# Patient Record
Sex: Female | Born: 1950
Health system: Southern US, Community
[De-identification: ages and names within clinical notes are randomized; demographics above are authoritative.]

## PROBLEM LIST (undated history)

## (undated) DIAGNOSIS — R233 Spontaneous ecchymoses: Secondary | ICD-10-CM

## (undated) DIAGNOSIS — E119 Type 2 diabetes mellitus without complications: Secondary | ICD-10-CM

## (undated) DIAGNOSIS — Z8709 Personal history of other diseases of the respiratory system: Secondary | ICD-10-CM

## (undated) DIAGNOSIS — I2510 Atherosclerotic heart disease of native coronary artery without angina pectoris: Secondary | ICD-10-CM

## (undated) DIAGNOSIS — Z87891 Personal history of nicotine dependence: Secondary | ICD-10-CM

## (undated) DIAGNOSIS — T4145XA Adverse effect of unspecified anesthetic, initial encounter: Secondary | ICD-10-CM

## (undated) DIAGNOSIS — Z9861 Coronary angioplasty status: Secondary | ICD-10-CM

## (undated) DIAGNOSIS — M1990 Unspecified osteoarthritis, unspecified site: Secondary | ICD-10-CM

## (undated) DIAGNOSIS — I2119 ST elevation (STEMI) myocardial infarction involving other coronary artery of inferior wall: Secondary | ICD-10-CM

## (undated) DIAGNOSIS — J449 Chronic obstructive pulmonary disease, unspecified: Secondary | ICD-10-CM

## (undated) DIAGNOSIS — I10 Essential (primary) hypertension: Secondary | ICD-10-CM

## (undated) DIAGNOSIS — R112 Nausea with vomiting, unspecified: Secondary | ICD-10-CM

## (undated) DIAGNOSIS — T8859XA Other complications of anesthesia, initial encounter: Secondary | ICD-10-CM

## (undated) DIAGNOSIS — M2550 Pain in unspecified joint: Secondary | ICD-10-CM

## (undated) DIAGNOSIS — G4733 Obstructive sleep apnea (adult) (pediatric): Secondary | ICD-10-CM

## (undated) DIAGNOSIS — E669 Obesity, unspecified: Secondary | ICD-10-CM

## (undated) DIAGNOSIS — Z9889 Other specified postprocedural states: Secondary | ICD-10-CM

## (undated) DIAGNOSIS — H269 Unspecified cataract: Secondary | ICD-10-CM

## (undated) DIAGNOSIS — E785 Hyperlipidemia, unspecified: Secondary | ICD-10-CM

## (undated) DIAGNOSIS — R0600 Dyspnea, unspecified: Secondary | ICD-10-CM

## (undated) DIAGNOSIS — C50411 Malignant neoplasm of upper-outer quadrant of right female breast: Secondary | ICD-10-CM

## (undated) DIAGNOSIS — J45909 Unspecified asthma, uncomplicated: Secondary | ICD-10-CM

## (undated) DIAGNOSIS — R238 Other skin changes: Secondary | ICD-10-CM

## (undated) HISTORY — PX: TUBAL LIGATION: SHX77

## (undated) HISTORY — PX: ABDOMINAL HYSTERECTOMY: SHX81

## (undated) HISTORY — PX: COLONOSCOPY: SHX174

## (undated) HISTORY — PX: OTHER SURGICAL HISTORY: SHX169

## (undated) HISTORY — DX: Obstructive sleep apnea (adult) (pediatric): G47.33

## (undated) HISTORY — PX: APPENDECTOMY: SHX54

## (undated) HISTORY — PX: EYE SURGERY: SHX253

## (undated) HISTORY — DX: Unspecified osteoarthritis, unspecified site: M19.90

## (undated) HISTORY — PX: SPINAL FUSION: SHX223

## (undated) HISTORY — PX: CHOLECYSTECTOMY: SHX55

## (undated) HISTORY — DX: Malignant neoplasm of upper-outer quadrant of right female breast: C50.411

## (undated) HISTORY — PX: PERCUTANEOUS CORONARY STENT INTERVENTION (PCI-S): SHX6016

---
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
## 1999-12-26 ENCOUNTER — Encounter: Admission: RE | Admit: 1999-12-26 | Discharge: 1999-12-26 | Payer: Self-pay | Admitting: *Deleted

## 2002-08-27 ENCOUNTER — Encounter: Admission: RE | Admit: 2002-08-27 | Discharge: 2002-08-27 | Payer: Self-pay | Admitting: Internal Medicine

## 2002-08-27 ENCOUNTER — Encounter: Payer: Self-pay | Admitting: Internal Medicine

## 2003-05-31 ENCOUNTER — Emergency Department (HOSPITAL_COMMUNITY): Admission: EM | Admit: 2003-05-31 | Discharge: 2003-06-01 | Payer: Self-pay | Admitting: *Deleted

## 2003-06-01 ENCOUNTER — Encounter: Payer: Self-pay | Admitting: Emergency Medicine

## 2005-12-19 ENCOUNTER — Encounter: Admission: RE | Admit: 2005-12-19 | Discharge: 2005-12-19 | Payer: Self-pay | Admitting: Internal Medicine

## 2006-07-02 ENCOUNTER — Ambulatory Visit (HOSPITAL_COMMUNITY): Admission: RE | Admit: 2006-07-02 | Discharge: 2006-07-02 | Payer: Self-pay | Admitting: Gastroenterology

## 2006-07-04 ENCOUNTER — Ambulatory Visit (HOSPITAL_COMMUNITY): Admission: RE | Admit: 2006-07-04 | Discharge: 2006-07-04 | Payer: Self-pay | Admitting: Gastroenterology

## 2009-02-10 ENCOUNTER — Other Ambulatory Visit: Admission: RE | Admit: 2009-02-10 | Discharge: 2009-02-10 | Payer: Self-pay | Admitting: Family Medicine

## 2009-07-09 ENCOUNTER — Encounter: Admission: RE | Admit: 2009-07-09 | Discharge: 2009-07-09 | Payer: Self-pay | Admitting: Family Medicine

## 2009-08-03 ENCOUNTER — Inpatient Hospital Stay (HOSPITAL_COMMUNITY): Admission: RE | Admit: 2009-08-03 | Discharge: 2009-08-09 | Payer: Self-pay | Admitting: Neurosurgery

## 2009-11-05 ENCOUNTER — Emergency Department (HOSPITAL_COMMUNITY): Admission: EM | Admit: 2009-11-05 | Discharge: 2009-11-05 | Payer: Self-pay | Admitting: Emergency Medicine

## 2010-05-26 ENCOUNTER — Encounter: Admission: RE | Admit: 2010-05-26 | Discharge: 2010-05-26 | Payer: Self-pay | Admitting: Orthopedic Surgery

## 2010-11-27 ENCOUNTER — Encounter: Payer: Self-pay | Admitting: Orthopedic Surgery

## 2011-02-10 LAB — CBC
HCT: 42.8 % (ref 36.0–46.0)
Hemoglobin: 14.4 g/dL (ref 12.0–15.0)
MCHC: 33.7 g/dL (ref 30.0–36.0)
RDW: 12.7 % (ref 11.5–15.5)

## 2011-02-10 LAB — BASIC METABOLIC PANEL
CO2: 31 mEq/L (ref 19–32)
Calcium: 9.7 mg/dL (ref 8.4–10.5)
Glucose, Bld: 113 mg/dL — ABNORMAL HIGH (ref 70–99)
Sodium: 139 mEq/L (ref 135–145)

## 2011-02-10 LAB — TYPE AND SCREEN: Antibody Screen: NEGATIVE

## 2011-03-24 NOTE — Op Note (Signed)
NAMEAMANDAMARIE, Mariah Kelley         ACCOUNT NO.:  [id number]   MEDICAL RECORD NO.:  [id number]          PATIENT TYPE:  OUT   LOCATION:  XRAY                         FACILITY:  [facility]   PHYSICIAN:  Anselmo Rod, M.D.  DATE OF BIRTH:  11/29/1950   DATE OF PROCEDURE:  07/02/2006  DATE OF DISCHARGE:                                 OPERATIVE REPORT   PROCEDURE PERFORMED:  Esophagogastroduodenoscopy.   ENDOSCOPIST:  Anselmo Rod, M.D.   INSTRUMENT USED:  Olympus videoscopic pan endoscope.   INDICATIONS FOR PROCEDURE:  A 60 year old white female with a history of  abnormal abdominal CT with question of pylorospasm with an abnormality in  the gastric pylorus, undergoing an EGD for abdominal pain and recurrent  bouts of nausea and vomiting, rule out gastric outlet obstruction, ulcer  disease, etc.   PRE-PROCEDURE PREPARATION:  Informed consent was procured from the patient.  The patient was fasted for four hours prior to the procedure.  Risks and  benefits of the procedure were discussed with the patient in great detail.   PRE-PROCEDURE PHYSICAL EXAMINATION:  VITAL SIGNS:  Stable vital signs.  NECK:  Supple.  CHEST:  Clear to auscultation.  HEART:  S1 and S2.  Regular.  ABDOMEN:  Soft with epigastric tenderness on palpation with guarding.  No  rebound or rigidity.  No hepatosplenomegaly.   DESCRIPTION OF PROCEDURE:  The patient was placed in a left lateral  decubitus position and sedated with 75 mcg of fentanyl and 5 mg of Versed in  slow, incremental doses.  Once the patient was adequately sedated and  maintained on low flow oxygen with continuous cardiac monitoring, the  Olympus videoscopic pan endoscope was advanced with the mouthpiece over the  tongue into the esophagus.  Under direct vision, the entire esophagus  appeared normal with no evidence of ring, suture, mass, esophagitis, or  Barrett's mucosa.  The scope was then advanced to the stomach.  The entire  gastric mucosa  appeared normal with no evidence of ulcers, erosions, masses,  or polyps.  There were no masses noted on retroflexion into the high cardia.  The proximal small bowel appeared normal.  No abnormalities were noted in  the pylorus that corresponded with abnormalities on the recent CT done at  [facility] that showed luminal narrowing of the pylorus with decreased  distensibility.  The patient tolerated the procedure well without  complications.   IMPRESSION:  Normal esophagogastroduodenoscopy.   RECOMMENDATIONS:  A small bowel follow-through versus an enteroclysis will  be considered and further recommendation made thereafter.      Anselmo Rod, M.D.  Electronically Signed     JNM/MEDQ  D:  07/04/2006  T:  07/04/2006  Job:  [id number]   cc:   Merlene Laughter. Renae Gloss, M.D.

## 2014-01-17 ENCOUNTER — Encounter (HOSPITAL_COMMUNITY)
Admission: EM | Disposition: A | Discharge status: Home / Self Care | Payer: BC Managed Care – PPO | Source: Home / Self Care | Attending: Cardiovascular Disease

## 2014-01-17 ENCOUNTER — Encounter (HOSPITAL_COMMUNITY): Payer: Self-pay | Admitting: Emergency Medicine

## 2014-01-17 ENCOUNTER — Inpatient Hospital Stay (HOSPITAL_COMMUNITY)
Admission: EM | Admit: 2014-01-17 | Discharge: 2014-01-20 | DRG: 247 | Disposition: A | Discharge status: Home / Self Care | Payer: BC Managed Care – PPO | Attending: Cardiovascular Disease | Admitting: Cardiovascular Disease

## 2014-01-17 DIAGNOSIS — E7800 Pure hypercholesterolemia, unspecified: Secondary | ICD-10-CM | POA: Diagnosis present

## 2014-01-17 DIAGNOSIS — Z87891 Personal history of nicotine dependence: Secondary | ICD-10-CM

## 2014-01-17 DIAGNOSIS — E119 Type 2 diabetes mellitus without complications: Secondary | ICD-10-CM

## 2014-01-17 DIAGNOSIS — I2510 Atherosclerotic heart disease of native coronary artery without angina pectoris: Secondary | ICD-10-CM

## 2014-01-17 DIAGNOSIS — I252 Old myocardial infarction: Secondary | ICD-10-CM | POA: Diagnosis present

## 2014-01-17 DIAGNOSIS — F411 Generalized anxiety disorder: Secondary | ICD-10-CM | POA: Diagnosis present

## 2014-01-17 DIAGNOSIS — I10 Essential (primary) hypertension: Secondary | ICD-10-CM

## 2014-01-17 DIAGNOSIS — Z888 Allergy status to other drugs, medicaments and biological substances status: Secondary | ICD-10-CM

## 2014-01-17 DIAGNOSIS — Z8249 Family history of ischemic heart disease and other diseases of the circulatory system: Secondary | ICD-10-CM

## 2014-01-17 DIAGNOSIS — E8881 Metabolic syndrome: Secondary | ICD-10-CM | POA: Diagnosis present

## 2014-01-17 DIAGNOSIS — I213 ST elevation (STEMI) myocardial infarction of unspecified site: Secondary | ICD-10-CM

## 2014-01-17 DIAGNOSIS — Z7982 Long term (current) use of aspirin: Secondary | ICD-10-CM

## 2014-01-17 DIAGNOSIS — Z6841 Body Mass Index (BMI) 40.0 and over, adult: Secondary | ICD-10-CM

## 2014-01-17 DIAGNOSIS — Z9861 Coronary angioplasty status: Secondary | ICD-10-CM

## 2014-01-17 DIAGNOSIS — E785 Hyperlipidemia, unspecified: Secondary | ICD-10-CM

## 2014-01-17 DIAGNOSIS — Z955 Presence of coronary angioplasty implant and graft: Secondary | ICD-10-CM

## 2014-01-17 DIAGNOSIS — I219 Acute myocardial infarction, unspecified: Secondary | ICD-10-CM

## 2014-01-17 DIAGNOSIS — I2119 ST elevation (STEMI) myocardial infarction involving other coronary artery of inferior wall: Principal | ICD-10-CM

## 2014-01-17 DIAGNOSIS — E669 Obesity, unspecified: Secondary | ICD-10-CM | POA: Diagnosis present

## 2014-01-17 HISTORY — DX: Essential (primary) hypertension: I10

## 2014-01-17 HISTORY — DX: Atherosclerotic heart disease of native coronary artery without angina pectoris: I25.10

## 2014-01-17 HISTORY — DX: Coronary angioplasty status: Z98.61

## 2014-01-17 HISTORY — PX: LEFT HEART CATH: SHX5478

## 2014-01-17 HISTORY — DX: ST elevation (STEMI) myocardial infarction involving other coronary artery of inferior wall: I21.19

## 2014-01-17 HISTORY — DX: Personal history of nicotine dependence: Z87.891

## 2014-01-17 HISTORY — DX: Obesity, unspecified: E66.9

## 2014-01-17 HISTORY — DX: Hyperlipidemia, unspecified: E78.5

## 2014-01-17 LAB — HEMOGLOBIN A1C
Hgb A1c MFr Bld: 6.4 % — ABNORMAL HIGH (ref <5.7)
MEAN PLASMA GLUCOSE: 137 mg/dL — AB (ref <117)

## 2014-01-17 LAB — COMPREHENSIVE METABOLIC PANEL
ALT: 22 U/L (ref 0–35)
AST: 21 U/L (ref 0–37)
Albumin: 4 g/dL (ref 3.5–5.2)
Alkaline Phosphatase: 80 U/L (ref 39–117)
BILIRUBIN TOTAL: 0.3 mg/dL (ref 0.3–1.2)
BUN: 18 mg/dL (ref 6–23)
CALCIUM: 9.6 mg/dL (ref 8.4–10.5)
CHLORIDE: 99 meq/L (ref 96–112)
CO2: 26 meq/L (ref 19–32)
CREATININE: 0.75 mg/dL (ref 0.50–1.10)
GFR, EST NON AFRICAN AMERICAN: 88 mL/min — AB (ref >90)
GLUCOSE: 204 mg/dL — AB (ref 70–99)
Potassium: 3.9 mEq/L (ref 3.7–5.3)
Sodium: 141 mEq/L (ref 137–147)
Total Protein: 7.2 g/dL (ref 6.0–8.3)

## 2014-01-17 LAB — CBC WITH DIFFERENTIAL/PLATELET
BASOS ABS: 0 10*3/uL (ref 0.0–0.1)
Basophils Relative: 0 % (ref 0–1)
EOS PCT: 1 % (ref 0–5)
Eosinophils Absolute: 0.1 10*3/uL (ref 0.0–0.7)
HEMATOCRIT: 41.7 % (ref 36.0–46.0)
HEMOGLOBIN: 13.9 g/dL (ref 12.0–15.0)
LYMPHS ABS: 1.7 10*3/uL (ref 0.7–4.0)
LYMPHS PCT: 18 % (ref 12–46)
MCH: 29.2 pg (ref 26.0–34.0)
MCHC: 33.3 g/dL (ref 30.0–36.0)
MCV: 87.6 fL (ref 78.0–100.0)
MONO ABS: 0.4 10*3/uL (ref 0.1–1.0)
MONOS PCT: 4 % (ref 3–12)
NEUTROS ABS: 7.4 10*3/uL (ref 1.7–7.7)
Neutrophils Relative %: 77 % (ref 43–77)
Platelets: 297 10*3/uL (ref 150–400)
RBC: 4.76 MIL/uL (ref 3.87–5.11)
RDW: 12.8 % (ref 11.5–15.5)
WBC: 9.7 10*3/uL (ref 4.0–10.5)

## 2014-01-17 LAB — TROPONIN I
TROPONIN I: 0.9 ng/mL — AB (ref <0.30)
Troponin I: 0.56 ng/mL (ref <0.30)
Troponin I: 9.37 ng/mL (ref <0.30)

## 2014-01-17 LAB — POCT ACTIVATED CLOTTING TIME
Activated Clotting Time: 188 seconds
Activated Clotting Time: 437 seconds

## 2014-01-17 LAB — MAGNESIUM: Magnesium: 1.7 mg/dL (ref 1.5–2.5)

## 2014-01-17 LAB — MRSA PCR SCREENING: MRSA BY PCR: NEGATIVE

## 2014-01-17 LAB — T4, FREE: FREE T4: 1.28 ng/dL (ref 0.80–1.80)

## 2014-01-17 LAB — TSH: TSH: 1.318 u[IU]/mL (ref 0.350–4.500)

## 2014-01-17 SURGERY — LEFT HEART CATH
Anesthesia: LOCAL | Laterality: Bilateral

## 2014-01-17 MED ORDER — ACTIVE PARTNERSHIP FOR HEALTH OF YOUR HEART BOOK
Freq: Once | Status: AC
  Administered 2014-01-17: 11:00:00
  Filled 2014-01-17: qty 1

## 2014-01-17 MED ORDER — ANGIOPLASTY BOOK
Freq: Once | Status: AC
  Administered 2014-01-17: 11:00:00
  Filled 2014-01-17: qty 1

## 2014-01-17 MED ORDER — BIVALIRUDIN 250 MG IV SOLR
INTRAVENOUS | Status: AC
  Filled 2014-01-17: qty 250

## 2014-01-17 MED ORDER — HEPARIN (PORCINE) IN NACL 2-0.9 UNIT/ML-% IJ SOLN
INTRAMUSCULAR | Status: AC
Start: 2014-01-17 — End: 2014-01-17
  Filled 2014-01-17: qty 1000

## 2014-01-17 MED ORDER — SODIUM CHLORIDE 0.9 % IV SOLN
INTRAVENOUS | Status: AC
  Administered 2014-01-17: 12:00:00 via INTRAVENOUS

## 2014-01-17 MED ORDER — TICAGRELOR 90 MG PO TABS
90.0000 mg | ORAL_TABLET | Freq: Two times a day (BID) | ORAL | Status: DC
  Administered 2014-01-17 – 2014-01-20 (×6): 90 mg via ORAL
  Filled 2014-01-17 (×9): qty 1

## 2014-01-17 MED ORDER — SODIUM CHLORIDE 0.9 % IV SOLN
1.7500 mg/kg/h | INTRAVENOUS | Status: DC
  Filled 2014-01-17: qty 250

## 2014-01-17 MED ORDER — VERAPAMIL HCL 2.5 MG/ML IV SOLN
INTRAVENOUS | Status: AC
  Filled 2014-01-17: qty 2

## 2014-01-17 MED ORDER — ONDANSETRON HCL 4 MG/2ML IJ SOLN
4.0000 mg | Freq: Once | INTRAMUSCULAR | Status: AC
  Administered 2014-01-17: 4 mg via INTRAVENOUS

## 2014-01-17 MED ORDER — HYDRALAZINE HCL 20 MG/ML IJ SOLN
10.0000 mg | INTRAMUSCULAR | Status: DC | PRN
  Administered 2014-01-17: 10 mg via INTRAVENOUS
  Filled 2014-01-17: qty 1

## 2014-01-17 MED ORDER — ASPIRIN 81 MG PO CHEW
81.0000 mg | CHEWABLE_TABLET | Freq: Every day | ORAL | Status: DC
  Administered 2014-01-18 – 2014-01-20 (×3): 81 mg via ORAL
  Filled 2014-01-17 (×3): qty 1

## 2014-01-17 MED ORDER — ASPIRIN 81 MG PO CHEW
CHEWABLE_TABLET | ORAL | Status: AC
  Filled 2014-01-17: qty 3

## 2014-01-17 MED ORDER — SODIUM CHLORIDE 0.9 % IV SOLN: 250.0000 mL | INTRAVENOUS | Status: DC | PRN

## 2014-01-17 MED ORDER — FENTANYL CITRATE 0.05 MG/ML IJ SOLN
INTRAMUSCULAR | Status: AC
  Filled 2014-01-17: qty 2

## 2014-01-17 MED ORDER — HEPARIN SODIUM (PORCINE) 1000 UNIT/ML IJ SOLN
4000.0000 [IU] | Freq: Once | INTRAMUSCULAR | Status: DC
Start: 2014-01-17 — End: 2014-01-17

## 2014-01-17 MED ORDER — SODIUM CHLORIDE 0.9 % IJ SOLN: 3.0000 mL | INTRAMUSCULAR | Status: DC | PRN

## 2014-01-17 MED ORDER — MORPHINE SULFATE 2 MG/ML IJ SOLN
1.0000 mg | INTRAMUSCULAR | Status: DC | PRN
  Filled 2014-01-17: qty 1

## 2014-01-17 MED ORDER — HEPARIN SODIUM (PORCINE) 5000 UNIT/ML IJ SOLN
4000.0000 [IU] | INTRAMUSCULAR | Status: AC
  Administered 2014-01-17: 4000 [IU] via INTRAVENOUS

## 2014-01-17 MED ORDER — ASPIRIN 81 MG PO CHEW: 243.0000 mg | CHEWABLE_TABLET | Freq: Once | ORAL | Status: DC

## 2014-01-17 MED ORDER — ATORVASTATIN CALCIUM 80 MG PO TABS
80.0000 mg | ORAL_TABLET | Freq: Every day | ORAL | Status: DC
  Administered 2014-01-17 – 2014-01-19 (×3): 80 mg via ORAL
  Filled 2014-01-17 (×4): qty 1

## 2014-01-17 MED ORDER — NITROGLYCERIN 0.2 MG/ML ON CALL CATH LAB
INTRAVENOUS | Status: AC
  Filled 2014-01-17: qty 1

## 2014-01-17 MED ORDER — HEPARIN SODIUM (PORCINE) 1000 UNIT/ML IJ SOLN
INTRAMUSCULAR | Status: AC
  Filled 2014-01-17: qty 1

## 2014-01-17 MED ORDER — ATORVASTATIN CALCIUM 80 MG PO TABS: 80.0000 mg | ORAL_TABLET | Freq: Every day | ORAL | Status: DC

## 2014-01-17 MED ORDER — ASPIRIN 81 MG PO CHEW: 283.0000 mg | CHEWABLE_TABLET | Freq: Once | ORAL | Status: DC

## 2014-01-17 MED ORDER — TICAGRELOR 90 MG PO TABS
ORAL_TABLET | ORAL | Status: AC
  Filled 2014-01-17: qty 2

## 2014-01-17 MED ORDER — SODIUM CHLORIDE 0.9 % IJ SOLN
3.0000 mL | Freq: Two times a day (BID) | INTRAMUSCULAR | Status: DC
  Administered 2014-01-17 – 2014-01-20 (×6): 3 mL via INTRAVENOUS

## 2014-01-17 MED ORDER — ONDANSETRON HCL 4 MG/2ML IJ SOLN
INTRAMUSCULAR | Status: AC
  Filled 2014-01-17: qty 2

## 2014-01-17 MED ORDER — LISINOPRIL 5 MG PO TABS
5.0000 mg | ORAL_TABLET | Freq: Every day | ORAL | Status: DC
  Administered 2014-01-17: 5 mg via ORAL
  Filled 2014-01-17 (×2): qty 1

## 2014-01-17 MED ORDER — SODIUM CHLORIDE 0.9 % IV SOLN: 0.2500 mg/kg/h | INTRAVENOUS | Status: AC

## 2014-01-17 MED ORDER — ASPIRIN 81 MG PO CHEW
243.0000 mg | CHEWABLE_TABLET | Freq: Once | ORAL | Status: AC
  Administered 2014-01-17: 243 mg via ORAL

## 2014-01-17 MED ORDER — ALPRAZOLAM 0.5 MG PO TABS
0.5000 mg | ORAL_TABLET | Freq: Two times a day (BID) | ORAL | Status: DC | PRN
  Administered 2014-01-17 (×2): 0.5 mg via ORAL
  Filled 2014-01-17 (×2): qty 1

## 2014-01-17 MED ORDER — HEPARIN SODIUM (PORCINE) 5000 UNIT/ML IJ SOLN
INTRAMUSCULAR | Status: AC
  Filled 2014-01-17: qty 1

## 2014-01-17 MED ORDER — HEPARIN BOLUS VIA INFUSION: 4000.0000 [IU] | Freq: Once | INTRAVENOUS | Status: DC

## 2014-01-17 MED ORDER — METOPROLOL TARTRATE 25 MG PO TABS
25.0000 mg | ORAL_TABLET | Freq: Two times a day (BID) | ORAL | Status: DC
  Administered 2014-01-17 – 2014-01-18 (×4): 25 mg via ORAL
  Filled 2014-01-17 (×6): qty 1

## 2014-01-17 MED ORDER — ASPIRIN EC 81 MG PO TBEC: 81.0000 mg | DELAYED_RELEASE_TABLET | Freq: Every day | ORAL | Status: DC

## 2014-01-17 MED ORDER — HEART ATTACK BOUNCING BOOK
Freq: Once | Status: AC
  Administered 2014-01-17: 11:00:00
  Filled 2014-01-17: qty 1

## 2014-01-17 MED ORDER — LIDOCAINE HCL (PF) 1 % IJ SOLN
INTRAMUSCULAR | Status: AC
  Filled 2014-01-17: qty 30

## 2014-01-17 NOTE — Progress Notes (Signed)
EKG was without acute changes.  PA with cardiology was called and states he will come to see pt

## 2014-01-17 NOTE — Plan of Care (Signed)
Problem: Consults Goal: MI Patient Education (See Patient Education module for education specifics.) Outcome: Progressing AHA partner book, stent book, bouncing back book, brillinta book and patient education guide all provided  Problem: Phase I Progression Outcomes Goal: Vascular site scale level 0 - I Vascular Site Scale Level 0: No bruising/bleeding/hematoma Level I (Mild): Bruising/Ecchymosis, minimal bleeding/ooozing, palpable hematoma < 3 cm Level II (Moderate): Bleeding not affecting hemodynamic parameters, pseudoaneurysm, palpable hematoma > 3 cm Level III (Severe) Bleeding which affects hemodynamic parameters or retroperitoneal hemorrhage  Outcome: Progressing Currently a level 0 Goal: Initial discharge plan identified Outcome: Completed/Met Date Met:  01/17/14 Plans to return home with friend     

## 2014-01-17 NOTE — ED Provider Notes (Signed)
CSN: [id number]     Arrival date & time 01/17/14  7425 History   First MD Initiated Contact with Patient 01/17/14 [phone]     Chief Complaint  Patient presents with  . Code STEMI     (Consider location/radiation/quality/duration/timing/severity/associated sxs/prior Treatment) HPI Comments: Patient is a 63 year old female with no prior cardiac history. She woke from sleep at approximately 2 AM with tightness in the center of her chest. This radiated to her neck and both shoulders. She felt somewhat dizzy and nauseated. She does have a history of hypertension and high cholesterol however is a nonsmoker and is not diabetic.  Patient is a 63 y.o. female presenting with chest pain. The history is provided by the patient.  Chest Pain Pain location:  Substernal area Pain quality: pressure   Pain radiates to:  Neck, L arm and R arm Pain radiates to the back: no   Pain severity:  Moderate Onset quality:  Sudden Duration:  5 hours Timing:  Constant Progression:  Worsening Chronicity:  New Context: not breathing   Relieved by:  Nothing Worsened by:  Nothing tried Ineffective treatments:  None tried Associated symptoms: no abdominal pain     Past Medical History  Diagnosis Date  . Hypertension    History reviewed. No pertinent past surgical history. No family history on file. History  Substance Use Topics  . Smoking status: Never Smoker   . Smokeless tobacco: Not on file  . Alcohol Use: Yes   OB History   Grav Para Term Preterm Abortions TAB SAB Ect Mult Living                 Review of Systems  Cardiovascular: Positive for chest pain.  Gastrointestinal: Negative for abdominal pain.  All other systems reviewed and are negative.      Allergies  Prednisone  Home Medications  No current outpatient prescriptions on file. BP 183/95  Pulse 93  Temp(Src) 97.7 F (36.5 C) (Oral)  Resp 20  SpO2 95% Physical Exam  Nursing note and vitals reviewed. Constitutional: She is  oriented to person, place, and time. She appears well-developed and well-nourished. No distress.  HENT:  Head: Normocephalic and atraumatic.  Neck: Normal range of motion. Neck supple.  Cardiovascular: Normal rate and regular rhythm.  Exam reveals no gallop and no friction rub.   No murmur heard. Pulmonary/Chest: Effort normal and breath sounds normal. No respiratory distress. She has no wheezes.  Abdominal: Soft. Bowel sounds are normal. She exhibits no distension. There is no tenderness.  Musculoskeletal: Normal range of motion.  Neurological: She is alert and oriented to person, place, and time.  Skin: Skin is warm and dry. She is not diaphoretic.    ED Course  Procedures (including critical care time) Labs Review Labs Reviewed  CBC WITH DIFFERENTIAL  COMPREHENSIVE METABOLIC PANEL  TROPONIN I   Imaging Review No results found.   EKG Interpretation   Date/Time:  Saturday January 17 2014 06:56:51 EDT Ventricular Rate:  86 PR Interval:  156 QRS Duration: 86 QT Interval:  384 QTC Calculation: 459 R Axis:   13 Text Interpretation:  Normal sinus rhythm ** ** ACUTE MI / STEMI ** **  Confirmed by DELOS  MD, Elnoria Livingston ([id number]) on 01/17/2014 7:21:41 AM      MDM   Final diagnoses:  None    Patient is a 63 year old female who presents with chest discomfort that started approximately 2 AM. EKG reveals evidence for an inferior lateral MI. There are ST segment  elevations present in these areas, however there is not much in the form of reciprocal changes. I suspect this is an acute MI, and less likely pericarditis. Either way, I feel as though alerting the cath lab with a code STEMI is appropriate. She was given heparin, aspirin, oxygen and will be transported to the Cath Lab for intervention.  CRITICAL CARE Performed by: Veryl Speak Total critical care time: 30 minutes Critical care time was exclusive of separately billable procedures and treating other patients. Critical care was  necessary to treat or prevent imminent or life-threatening deterioration. Critical care was time spent personally by me on the following activities: development of treatment plan with patient and/or surrogate as well as nursing, discussions with consultants, evaluation of patient's response to treatment, examination of patient, obtaining history from patient or surrogate, ordering and performing treatments and interventions, ordering and review of laboratory studies, ordering and review of radiographic studies, pulse oximetry and re-evaluation of patient's condition.     Veryl Speak, MD 01/17/14 [phone]

## 2014-01-17 NOTE — ED Notes (Signed)
The pt c/o mid-chest pain with bi-lateral radiation in both arms since 0200am today.  Dizziness and nausea.  She took 81mg  of aspirin at [facility]

## 2014-01-17 NOTE — Progress Notes (Signed)
Pt complained of anxiety at 1300 and received .5 mg of xanax.  Now she complains that she continues to feel anxious, feels a pressure in her stomach.  She was assisted back to bed, is slightly dizzy and feels nauseated.  Called for Stat EKG.  O2 was applied at 2 liters.  She denies chest pain and states that this current feeling is not similar to her pain prior to admission. Will cont to monitor,

## 2014-01-17 NOTE — H&P (Signed)
History and Physical   Patient ID: SKYLIE HIOTT MRN: [id number], DOB/AGE: 1950/11/11   Admit date: 01/17/2014 Date of Consult: 01/17/2014   Primary Physician: Dr. Jonathon Jordan Primary Cardiologist: New  HPI: Mariah Kelley is a 63 y.o. female w/ PMHx s/f HTN, HLD, prior tobacco abuse, obesity and family h/o premature CAD who is admitted to [facility] hospital today for STEMI.   She was in her USOH until ~2AM this morning when she experienced sudden onset substernal chest pressure radiating to her neck and arms bilaterally w/ associated nausea and vomiting. The discomfort persisted through the morning. She took an ASA 81mg  ~0600 w/ minimal relief. When the discomfort intensified to a 7-8/10, she sought medical care.  She has no prior cardiac history. She reports smoking 1 PPD x 40 years, no current tobacco abuse. Mother with MI in her 34s, father with MI at 10, brothers w/ CAD. Previously on medication for high cholesterol. No current medications.   In the ED, EKG revealed NSR w/ 51mm ST elevations in II, III, aVF and V5, V6 meeting diagnostic criteria for an inferolateral STEMI. Code STEMI was called, and she was transported emergently to the cardiac cath lab.   She received an additional 243 mg of ASA and heparin bolus.   TnI returned at 0.56.   She is awake, alert and in NAD. Chest pain currently 2/10.  Problem List: Past Medical History  Diagnosis Date  . Hypertension   . History of tobacco abuse     40 pack-years  . Obesity   . Hyperlipidemia     Past Surgical History  Procedure Laterality Date  . Abdominal hysterectomy    . Tubule ligation    . Spinal fusion    . Appendectomy       Allergies:  Allergies  Allergen Reactions  . Prednisone Other (See Comments)    "made poison ivy worse"    Home Medications: Prior to Admission medications   Medication Sig Start Date End Date Taking? Authorizing Provider  acetaminophen (TYLENOL) 325 MG tablet Take  650 mg by mouth every 6 (six) hours as needed for mild pain.   Yes Historical Provider, MD  aspirin 81 MG chewable tablet Chew 81 mg by mouth once.   Yes Historical Provider, MD  ibuprofen (ADVIL,MOTRIN) 200 MG tablet Take 200 mg by mouth every 6 (six) hours as needed for mild pain.   Yes Historical Provider, MD    Inpatient Medications:   Prescriptions prior to admission  Medication Sig Dispense Refill  . acetaminophen (TYLENOL) 325 MG tablet Take 650 mg by mouth every 6 (six) hours as needed for mild pain.      [name] aspirin 81 MG chewable tablet Chew 81 mg by mouth once.      [name] ibuprofen (ADVIL,MOTRIN) 200 MG tablet Take 200 mg by mouth every 6 (six) hours as needed for mild pain.        Family History  Problem Relation Age of Onset  . Heart attack Mother 51    passed at 2  . Heart attack Father 66    s/p CABG, multiple PCIs  . CAD Brother     Still living  . CAD Brother     Still living     History   Social History  . Marital Status: Single    Spouse Name: N/A    Number of Children: N/A  . Years of Education: N/A   Occupational History  . Education officer, museum  Social History Main Topics  . Smoking status: Former Smoker -- 1.00 packs/day for 40 years    Types: Cigarettes  . Smokeless tobacco: Former Systems developer    Quit date: 01/17/2013  . Alcohol Use: No  . Drug Use: No  . Sexual Activity: Not on file   Other Topics Concern  . Not on file   Social History Narrative   Divorced, 2 children.      Review of Systems: General: negative for chills, fever, night sweats or weight changes.  Cardiovascular: positive for chest pain, negative for dyspnea on exertion, edema, orthopnea, palpitations, paroxysmal nocturnal dyspnea or shortness of breath Dermatological: negative for rash Respiratory: negative for cough or wheezing Urologic: negative for hematuria Abdominal: positive for nausea, vomiting, negative for diarrhea, bright red blood per rectum, melena, or  hematemesis Neurologic: negative for visual changes, syncope, or dizziness All other systems reviewed and are otherwise negative except as noted above.  Physical Exam: Blood pressure 186/88, pulse 89, temperature 97.7 F (36.5 C), temperature source Oral, resp. rate 16, SpO2 99.00%.    General: Well developed, obese-appearing female, in no acute distress. Head: Normocephalic, atraumatic, sclera non-icteric, no xanthomas, nares are without discharge, peri-orbital xanthelasma appreciated Neck: Negative for carotid bruits. JVD not elevated. Lungs: Clear bilaterally to auscultation without wheezes, rales, or rhonchi. Breathing is unlabored. Heart: RRR with S1 S2. No murmurs, rubs, or gallops appreciated. Abdomen: Soft, non-tender, non-distended with normoactive bowel sounds. No hepatomegaly. No rebound/guarding. No obvious abdominal masses. Msk:   Strength and tone appears normal for age. Extremities: No clubbing, cyanosis or edema.  Distal pedal pulses are 2+ and equal bilaterally. Neuro: Alert and oriented X 3. Moves all extremities spontaneously. Psych:  Responds to questions appropriately with a normal affect.  Labs: Recent Labs     01/17/14  0713  WBC  9.7  HGB  13.9  HCT  41.7  MCV  87.6  PLT  297   Recent Labs Lab 01/17/14 0713  NA 141  K 3.9  CL 99  CO2 26  BUN 18  CREATININE 0.75  CALCIUM 9.6  PROT 7.2  BILITOT 0.3  ALKPHOS 80  ALT 22  AST 21  GLUCOSE 204*   Recent Labs     01/17/14  0713  TROPONINI  0.56*   Radiology/Studies: No results found.  EKG: NSR, 65mm ST elevations II, III, aVF, V5, V6  ASSESSMENT AND PLAN:   1. Inferolateral STEMI 2. Hypertension 3. Hyperlipidemia 4. H/o tobacco abuse 5. Obesity  The patient is currently undergoing emergent cardiac catheterization. Will place admission orders to CCU. She will need low-dose ASA, +/- P2Y12 inhibitor. No evidence of bradycardia or hypotension on arrival. Will hold off on BB, nitrates for now  until after PCI. Start high-potency statin. Check lipid panel, Hgb A1C, TSH, cycle troponins. BMET and CBC as above are largely WNL. Further recommendations per interventionalist's findings.    Signed, R. Valeria Batman, PA-C 01/17/2014, 8:11 AM   I agree with admission H&P by Valeria Batman  PA-C. The patient presented with chest pain that began at 10:00 in the morning. She had inferior ST segment elevation. Factors include hypertension, hyperlipidemia and remote tobacco abuse. Her exam was benign. Her EKG was diagnostic it was elected to proceed with urgent cardiac catheterization via the  right radial approach.  Lorretta Harp, M.D., [facility], [place] [address] [place], [place]  [address]  [phone] 01/17/2014 11:44 AM

## 2014-01-17 NOTE — Progress Notes (Signed)
Patient is complaining of some anxiety. She was assisted oob to the chair and the PA was called and new orders were received for xanax

## 2014-01-17 NOTE — Plan of Care (Signed)
Problem: Phase I Progression Outcomes Goal: OOB as tolerated unless otherwise ordered Outcome: Progressing OOB x1 this am to [facility] Goal: Initial discharge plan identified Outcome: Completed/Met Date Met:  01/17/14 Pt will discharge home with friend  Problem: Phase II Progression Outcomes Goal: Vascular site scale level 0 - I Vascular Site Scale Level 0: No bruising/bleeding/hematoma Level I (Mild): Bruising/Ecchymosis, minimal bleeding/ooozing, palpable hematoma < 3 cm Level II (Moderate): Bleeding not affecting hemodynamic parameters, pseudoaneurysm, palpable hematoma > 3 cm Level III (Severe) Bleeding which affects hemodynamic parameters or retroperitoneal hemorrhage  Outcome: Progressing Currently a level 0 with tr band still in place

## 2014-01-17 NOTE — CV Procedure (Addendum)
Mariah Kelley is a 63 y.o. female    [id number] LOCATION:  FACILITY: [facility]  PHYSICIAN: Quay Burow, M.D. [birth year]   DATE OF PROCEDURE:  01/17/2014  DATE OF DISCHARGE:     CARDIAC CATHETERIZATION     History obtained from chart review.Mariah Kelley is a 63 y.o. female w/ PMHx s/f HTN, HLD, prior tobacco abuse, obesity and family h/o premature CAD who is admitted to [facility] today for STEMI.  She was in her USOH until ~2AM this morning when she experienced sudden onset substernal chest pressure radiating to her neck and arms bilaterally w/ associated nausea and vomiting. The discomfort persisted through the morning. She took an ASA 81mg  ~0600 w/ minimal relief. When the discomfort intensified to a 7-8/10, she sought medical care.  She has no prior cardiac history. She reports smoking 1 PPD x 40 years, no current tobacco abuse. Mother with MI in her 77s, father with MI at 69, brothers w/ CAD. Previously on medication for high cholesterol. No current medications.  In the ED, EKG revealed NSR w/ 75mm ST elevations in II, III, aVF and V5, V6 meeting diagnostic criteria for an inferolateral STEMI. Code STEMI was called, and she was transported emergently to the cardiac cath lab.     PROCEDURE DESCRIPTION:   The patient was brought to the second floor [place] [facility]iac cath lab in the postabsorptive state. She was premedicated with fentanyl IV. Her  Right wristwas prepped and shaved in usual sterile fashion. Xylocaine 1% was used for local anesthesia. A 6 French sheath was inserted into the right radial artery using standard Seldinger technique. The patient received 4000 units  of heparin  intravenously.  6 French right and left Judkins diagnostic catheters along with a 6 French pigtail catheter were used for selective coronary angiography and left ventriculography respectively. Visipaque dye was used for the entirety of the case. Retrograde aortic, ventricular and  pullback pressures were recorded.    HEMODYNAMICS:    AO SYSTOLIC/AO DIASTOLIC: 841/32   LV SYSTOLIC/LV DIASTOLIC: 440/10  ANGIOGRAPHIC RESULTS:   1. Left main; normal  2. LAD; 30-40% mid at the takeoff of the first diagonal branch which appeared to have 40-50% ostial stenosis. Was a moderate-sized branch 3. Left circumflex; nondominant and free of significant disease.  4. Right coronary artery; dominant with a 80% hypodense lesion in the midportion. This was most likely the "culprit vessel". 5. Left ventriculography; RAO left ventriculogram was performed using  25 mL of Visipaque dye at 12 mL/second. The overall LVEF estimated  55 %  With wall motion abnormalities notable for mild inferior hypokinesia  IMPRESSION:Mariah Kelley had an inferior STEMI. Her pain resolved by the time she got him up to a half table. She has a ulcerative plaque in the mid dominant RCA with otherwise minimal CAD. Will proceed with PCI and stenting using drug-eluting stent, Angiomax  and Brilenta  Procedure description: The patient received Angiomax bolus and ACT of 437. She received 180 mg of Brilenta by mouth. Using a 6 [place] JR 4 guide catheter along with a 1/190 cm long prolonged or guidewire direct stenting was performed with a 3 mm x 12 mm long Promus drug-eluting stent. This was deployed at 16 atmospheres (3.5 mm) resulting in reduction of 80% hypodense mid dominant RCA stenosis to 0% residual. Post dilatation was attempted however it was never able to have enough guide support to cross the first bend  Overall impression: Successful direct non primary  PCI and  stenting of mid dominant ulcer RCA plaque with drug-eluting stent and angina max. The patient is on dual antiplatelet therapy with aspirin and Brilenta. She has minimal residual CAD and preserved LV function. I believe she can be "gastrohepatic" and be discharged on day 3. Her enzymes will be cycled and she'll be treated with standard post MI in  pharmacology including the blocker, statin drug and Ace inhibitor.   [name], [facility] 01/17/2014 9:05 AM

## 2014-01-18 DIAGNOSIS — E119 Type 2 diabetes mellitus without complications: Secondary | ICD-10-CM | POA: Diagnosis not present

## 2014-01-18 DIAGNOSIS — I10 Essential (primary) hypertension: Secondary | ICD-10-CM | POA: Diagnosis not present

## 2014-01-18 DIAGNOSIS — I2119 ST elevation (STEMI) myocardial infarction involving other coronary artery of inferior wall: Secondary | ICD-10-CM

## 2014-01-18 DIAGNOSIS — E785 Hyperlipidemia, unspecified: Secondary | ICD-10-CM | POA: Diagnosis not present

## 2014-01-18 LAB — CBC
HCT: 39.4 % (ref 36.0–46.0)
Hemoglobin: 13 g/dL (ref 12.0–15.0)
MCH: 29 pg (ref 26.0–34.0)
MCHC: 33 g/dL (ref 30.0–36.0)
MCV: 87.9 fL (ref 78.0–100.0)
Platelets: 306 10*3/uL (ref 150–400)
RBC: 4.48 MIL/uL (ref 3.87–5.11)
RDW: 13.3 % (ref 11.5–15.5)
WBC: 10.8 10*3/uL — ABNORMAL HIGH (ref 4.0–10.5)

## 2014-01-18 LAB — BASIC METABOLIC PANEL
BUN: 15 mg/dL (ref 6–23)
CHLORIDE: 102 meq/L (ref 96–112)
CO2: 25 mEq/L (ref 19–32)
Calcium: 9.4 mg/dL (ref 8.4–10.5)
Creatinine, Ser: 0.67 mg/dL (ref 0.50–1.10)
GFR calc non Af Amer: >90 mL/min (ref >90)
GLUCOSE: 124 mg/dL — AB (ref 70–99)
Potassium: 4.2 mEq/L (ref 3.7–5.3)
Sodium: 140 mEq/L (ref 137–147)

## 2014-01-18 LAB — LIPID PANEL
CHOL/HDL RATIO: 4.7 ratio
Cholesterol: 231 mg/dL — ABNORMAL HIGH (ref 0–200)
HDL: 49 mg/dL (ref >39)
LDL CALC: 157 mg/dL — AB (ref 0–99)
Triglycerides: 127 mg/dL (ref <150)
VLDL: 25 mg/dL (ref 0–40)

## 2014-01-18 LAB — GLUCOSE, CAPILLARY
GLUCOSE-CAPILLARY: 107 mg/dL — AB (ref 70–99)
Glucose-Capillary: 100 mg/dL — ABNORMAL HIGH (ref 70–99)
Glucose-Capillary: 80 mg/dL (ref 70–99)

## 2014-01-18 LAB — TROPONIN I: Troponin I: 17.87 ng/mL (ref <0.30)

## 2014-01-18 MED ORDER — INSULIN ASPART 100 UNIT/ML ~~LOC~~ SOLN
0.0000 [IU] | Freq: Three times a day (TID) | SUBCUTANEOUS | Status: DC
  Administered 2014-01-19 – 2014-01-20 (×2): 2 [IU] via SUBCUTANEOUS

## 2014-01-18 MED ORDER — LISINOPRIL 10 MG PO TABS
10.0000 mg | ORAL_TABLET | Freq: Every day | ORAL | Status: DC
  Administered 2014-01-18: 10 mg via ORAL
  Filled 2014-01-18 (×2): qty 1

## 2014-01-18 NOTE — Progress Notes (Signed)
Report was called to RN on unit 3 [place]. Patient will transfer to [place] [facility]

## 2014-01-18 NOTE — Progress Notes (Signed)
SUBJECTIVE:   No further CP  OBJECTIVE:   Vitals:   Filed Vitals:   01/17/14 2244 01/18/14 0314 01/18/14 0500 01/18/14 0754  BP: 141/60 122/67  153/74  Pulse:    82  Temp: 98.2 F (36.8 C) 98.2 F (36.8 C)  97.2 F (36.2 C)  TempSrc: Oral Oral  Oral  Resp: 22 18  14   Height:      Weight:   289 lb 11 oz (131.4 kg)   SpO2: 96% 95%  97%   I&O's:   Intake/Output Summary (Last 24 hours) at 01/18/14 9417 Last data filed at 01/17/14 1800  Gross per 24 hour  Intake 1189.5 ml  Output   1200 ml  Net  -10.5 ml   TELEMETRY: Reviewed telemetry pt in NSR:     PHYSICAL EXAM General: Well developed, well nourished, in no acute distress Head: Eyes PERRLA, No xanthomas.   Normal cephalic and atramatic  Lungs:   Clear bilaterally to auscultation and percussion. Heart:   HRRR S1 S2 Pulses are 2+ & equal. Abdomen: Bowel sounds are positive, abdomen soft and non-tender without masses  Extremities:   No clubbing, cyanosis or edema.  DP +1 Neuro: Alert and oriented X 3. Psych:  Good affect, responds appropriately   LABS: Basic Metabolic Panel:  Recent Labs  01/17/14 0713 01/17/14 1539 01/18/14 0314  NA 141  --  140  K 3.9  --  4.2  CL 99  --  102  CO2 26  --  25  GLUCOSE 204*  --  124*  BUN 18  --  15  CREATININE 0.75  --  0.67  CALCIUM 9.6  --  9.4  MG  --  1.7  --    Liver Function Tests:  Recent Labs  01/17/14 0713  AST 21  ALT 22  ALKPHOS 80  BILITOT 0.3  PROT 7.2  ALBUMIN 4.0   No results found for this basename: LIPASE, AMYLASE,  in the last 72 hours CBC:  Recent Labs  01/17/14 0713 01/18/14 0314  WBC 9.7 10.8*  NEUTROABS 7.4  --   HGB 13.9 13.0  HCT 41.7 39.4  MCV 87.6 87.9  PLT 297 306   Cardiac Enzymes:  Recent Labs  01/17/14 1006 01/17/14 1539 01/18/14 0009  TROPONINI 0.90* 9.37* 17.87*   BNP: No components found with this basename: POCBNP,  D-Dimer: No results found for this basename: DDIMER,  in the last 72 hours Hemoglobin  A1C:  Recent Labs  01/17/14 1539  HGBA1C 6.4*   Fasting Lipid Panel:  Recent Labs  01/18/14 0314  CHOL 231*  HDL 49  LDLCALC 157*  TRIG 127  CHOLHDL 4.7   Thyroid Function Tests:  Recent Labs  01/17/14 1539  TSH 1.318   Anemia Panel: No results found for this basename: VITAMINB12, FOLATE, FERRITIN, TIBC, IRON, RETICCTPCT,  in the last 72 hours Coag Panel:   No results found for this basename: INR, PROTIME    RADIOLOGY: No results found.   ASSESSMENT AND PLAN:  1. Inferolateral STEMI - cath showed left main; normal, LAD; 30-40% mid at the takeoff of the first diagonal branch which appeared to have 40-50% ostial stenosis. Was a moderate-sized branch, Left circumflex; nondominant and free of significant disease, Right coronary artery; dominant with a 80% hypodense lesion in the midportion. This was most likely the "culprit vessel".  EF was 55 % with mild inferior hypokinesia. She underwent PCI DES stent to mid RCA.  Currently pain free. -  continue ASA/Brilinta/beta blocker/ACE I/statin - cycle enzymes until they peak 2. Hypertension - mildly elevated - increase Lisinopril to 10mg  daily 3. Hyperlipidemia  - continue atorvastatin 4. H/o tobacco abuse  5. Obesity  6. Newly diagnosed DM with elevated HbgA1C - cover BS with SS Insulin - consider adding low dose Metformin at discharge (hold for now due to recent contrast exposure)  Stable from cardiac standpoint for transfer to tele bed   Sueanne Margarita, MD  01/18/2014  8:08 AM

## 2014-01-19 ENCOUNTER — Encounter (HOSPITAL_COMMUNITY): Payer: Self-pay | Admitting: Cardiology

## 2014-01-19 DIAGNOSIS — Z9861 Coronary angioplasty status: Secondary | ICD-10-CM

## 2014-01-19 DIAGNOSIS — I2510 Atherosclerotic heart disease of native coronary artery without angina pectoris: Secondary | ICD-10-CM

## 2014-01-19 LAB — GLUCOSE, CAPILLARY
GLUCOSE-CAPILLARY: 129 mg/dL — AB (ref 70–99)
GLUCOSE-CAPILLARY: 112 mg/dL — AB (ref 70–99)
Glucose-Capillary: 102 mg/dL — ABNORMAL HIGH (ref 70–99)
Glucose-Capillary: 102 mg/dL — ABNORMAL HIGH (ref 70–99)

## 2014-01-19 MED ORDER — METOPROLOL TARTRATE 50 MG PO TABS
50.0000 mg | ORAL_TABLET | Freq: Two times a day (BID) | ORAL | Status: DC
  Administered 2014-01-19 – 2014-01-20 (×3): 50 mg via ORAL
  Filled 2014-01-19 (×4): qty 1

## 2014-01-19 MED ORDER — INFLUENZA VAC SPLIT QUAD 0.5 ML IM SUSP
0.5000 mL | INTRAMUSCULAR | Status: AC
  Administered 2014-01-20: 0.5 mL via INTRAMUSCULAR
  Filled 2014-01-19: qty 0.5

## 2014-01-19 MED ORDER — LISINOPRIL 5 MG PO TABS
5.0000 mg | ORAL_TABLET | Freq: Every day | ORAL | Status: DC
  Administered 2014-01-19 – 2014-01-20 (×2): 5 mg via ORAL
  Filled 2014-01-19 (×2): qty 1

## 2014-01-19 MED FILL — Sodium Chloride IV Soln 0.9%: INTRAVENOUS | Qty: 50 | Status: AC

## 2014-01-19 NOTE — Progress Notes (Signed)
CARDIAC REHAB PHASE I   PRE:  Rate/Rhythm: 96 SR  BP:  Supine:   Sitting: 129/74  Standing:    SaO2: 95 RA  MODE:  Ambulation: 740 ft   POST:  Rate/Rhythm: 132 ST  BP:  Supine:   Sitting: 149/81  Standing:    SaO2: 96 RA 0800-0930 Pt tolerated ambulation well without c/o of cp. She has some SOB with walking which she states that she has SOB with exercising. HR after walk 132 ST. Completed MI and stent education with pt.and friend. They voice understanding. Pt agrees to [place]. CRP in [place], will send referral. I have encouraged her to watch MI and diabetic videos.  Rodney Langton RN 01/19/2014 9:45 AM

## 2014-01-19 NOTE — Care Management Note (Addendum)
    Page 1 of 2   01/19/2014     3:42:38 PM   CARE MANAGEMENT NOTE 01/19/2014  Patient:  Mariah Kelley, Mariah Kelley   Account Number:  [id number]  Date Initiated:  01/19/2014  Documentation initiated by:  [facility]  Subjective/Objective Assessment:   63 y.o. female w/ PMHx s/f HTN, HLD, prior tobacco abuse, obesity and family h/o premature CAD who is admitted to [facility] hospital today for STEMI. //Home with children     Action/Plan:   Direct PCI and stenting of mid dominant ulcer RCA plaque with drug-eluting stent and angina max. //Access for Home Health needs and benefits check for Brilinta   Anticipated DC Date:  01/19/2014   Anticipated DC Plan:  [place]  CM consult      Choice offered to / List presented to:          [facility] arranged  [place] - 11 Patient Refused      Status of service:   Medicare Important Message given?   (If response is "NO", the following Medicare IM given date fields will be blank) Date Medicare IM given:   Date Additional Medicare IM given:    Discharge Disposition:    Per UR Regulation:    If discussed at Long Length of Stay Meetings, dates discussed:    Comments:  01/19/14 [place], RN, BSN, NCM [phone] per [employer] [place] online: medication is covered; tier 3; co-pay at retial is $100  01/19/14 [place], RN, BSN, [employer] [phone] Spoke with pt at bedside regarding benefits check for Brilinta.  Pt has brochure with 30 day free card and refill assistance card intact.  Pt utilizes [facility] in [place] for prescription needs.  NCM called pharmacy to confirm availability of medication.  Information relayed to pt.  Pt verbalizes importance of filling medication upon discharge.  I have recommended that this patient have Home Health services but she declines at this time. I have discussed the risks and benefits with her. The patient verbalizes understanding.

## 2014-01-19 NOTE — Progress Notes (Signed)
Patient ID: Mariah Kelley, female   DOB: 1951-06-13, 63 y.o.   MRN: [id number]  63 year old female with hypertension and hyperlipidemia as well as tobacco abuse and family history of premature coronary disease who presented with inferior STEMI on 01/17/2014. She was taken emergently to the cardiac catheterization lab where she was found to have a 80% hypodense mid RCA lesion that is felt to be the culprit lesion. This was treated with a Promus Premier DES 3.0 mm x 12 mm (deployed to roughly 3.5 mm). Ejection fraction of the time of cardiac catheterization ventriculography was estimated at 55% with inferior hypokinesia.  SUBJECTIVE:   1 episode of CP last PM - no ECG changes.  Resolved several min after NTG SL.  NO Pain today with CRH ambulation, but HR up to ~130.  OBJECTIVE:   Vitals:   Filed Vitals:   01/18/14 2149 01/18/14 2158 01/19/14 0141 01/19/14 0617  BP: 138/83 130/83 138/64 129/74  Pulse: 98 99 97 94  Temp:    98.4 F (36.9 C)  TempSrc:    Oral  Resp: 22 20 17 18   Height:      Weight:    285 lb 11.2 oz (129.593 kg)  SpO2: 98% 97% 95% 97%   I&O's:    Intake/Output Summary (Last 24 hours) at 01/19/14 0901 Last data filed at 01/19/14 0835  Gross per 24 hour  Intake    600 ml  Output   1000 ml  Net   -400 ml   TELEMETRY: Reviewed telemetry pt in NSR:  PHYSICAL EXAM General: Well developed, well nourished, in no acute distress Head: Eyes PERRLA, No xanthomas.   [place]/AT Lungs:   CTA B. auscultation and percussion. Nonlabored, good air movement Heart:   RRR S1 S2 Pulses are 2+ & equal. Abdomen: Bowel sounds are positive, abdomen soft and non-tender without masses  Extremities:   No clubbing, cyanosis or edema.  DP +1 Neuro: Alert and oriented X 3. Psych:  Good affect, responds appropriately  LABS: Basic Metabolic Panel:  Recent Labs  01/17/14 0713 01/17/14 1539 01/18/14 0314  NA 141  --  140  K 3.9  --  4.2  CL 99  --  102  CO2 26  --  25  GLUCOSE 204*   --  124*  BUN 18  --  15  CREATININE 0.75  --  0.67  CALCIUM 9.6  --  9.4  MG  --  1.7  --    Liver Function Tests:  Recent Labs  01/17/14 0713  AST 21  ALT 22  ALKPHOS 80  BILITOT 0.3  PROT 7.2  ALBUMIN 4.0   No results found for this basename: LIPASE, AMYLASE,  in the last 72 hours CBC:  Recent Labs  01/17/14 0713 01/18/14 0314  WBC 9.7 10.8*  NEUTROABS 7.4  --   HGB 13.9 13.0  HCT 41.7 39.4  MCV 87.6 87.9  PLT 297 306   Cardiac Enzymes:  Recent Labs  01/17/14 1006 01/17/14 1539 01/18/14 0009  TROPONINI 0.90* 9.37* 17.87*   BNP: No components found with this basename: POCBNP,  D-Dimer: No results found for this basename: DDIMER,  in the last 72 hours Hemoglobin A1C:  Recent Labs  01/17/14 1539  HGBA1C 6.4*   Fasting Lipid Panel:  Recent Labs  01/18/14 0314  CHOL 231*  HDL 49  LDLCALC 157*  TRIG 127  CHOLHDL 4.7   Thyroid Function Tests:  Recent Labs  01/17/14 1539  TSH  1.318   Anemia Panel: No results found for this basename: VITAMINB12, FOLATE, FERRITIN, TIBC, IRON, RETICCTPCT,  in the last 72 hours Coag Panel:   No results found for this basename: INR,  PROTIME    RADIOLOGY: No results found. Cardiac Cath-PCI3/14/2015 -: Left main; normal, LAD; 30-40% mid at the takeoff of the first diagonal branch which appeared to have 40-50% ostial stenosis. Was a moderate-sized branch, Left circumflex; nondominant and free of significant disease, Right coronary artery; dominant with a 80% hypodense lesion in the midportion. This was most likely the "culprit vessel".  EF was 55 % with mild inferior hypokinesia. She underwent PCI DES [employer])  to mid RCA.  ASSESSMENT AND PLAN:  Principal Problem:   ST elevation myocardial infarction (STEMI) of inferolateral wall Active Problems:   Diabetes   CAD S/P percutaneous coronary angioplasty - inferior STEMI: Mid RCA PCI Promus DES 3.0 mm x 12 mm (2.5 mm)   HTN (hypertension)   Dyslipidemia     1. Inferolateral STEMI - cath showed l  Currently pain free. - continue ASA/Brilinta/beta blocker/ACE I/statin - cycle enzymes until they peak 2. Hypertension - Improved today  -  Lisinopril to 10mg  daily yesterday along with 25mg  bid Lopressor -- with need for better rate control, will increase BB to 50 mg bid & reduce ACE-I to 5mg . 3. Hyperlipidemia  - continue atorvastatin 4. H/o tobacco abuse  - quit prior to MI. 5. Obesity - CRH consult Phase 2 -- will benefit from Nutrition counseling. 6. Newly diagnosed DM with elevated HbgA1C - cover BS with SS Insulin - Initiat low dose Metformin at discharge (hold for now due to recent contrast exposure)  Stable today - CRH to ambulate pt.  Anticipate d/c in AM if remains stable. Should be OK to go to training seminar in [place] on Mon 3/23.   Leonie Man, MD  01/19/2014  9:01 AM

## 2014-01-19 NOTE — Progress Notes (Signed)
Patient complained of chest pain 7out of 10. But after about 5 minutes patient states pain has gone down to a 2-3 out of 10.  Patient states it hurts only when she breaths. VSS and EKG was performed. On-call doctor paged. Notified charge nurse. Due to patient only having pain when breathing, was advised by charge nurse to see what the doctor says as far as other measures to give for pain instead of nitro SL. Patient did not want morphine and at that time patient got up to the bathroom and states the pain has almost gone but is barely there. Waiting for doctor to call back Will continue to monitor patient to end of shift.

## 2014-01-19 NOTE — Progress Notes (Signed)
PT c/o C/P last night  Medicated with Nitre x 2 @ 2147 & 2152 respectively with good effect. [place], [place], notified. Pt remained asymptomatic

## 2014-01-20 ENCOUNTER — Encounter (HOSPITAL_COMMUNITY): Payer: Self-pay | Admitting: Cardiology

## 2014-01-20 LAB — TROPONIN I: TROPONIN I: 3.61 ng/mL — AB (ref <0.30)

## 2014-01-20 LAB — BASIC METABOLIC PANEL
BUN: 16 mg/dL (ref 6–23)
CALCIUM: 9.8 mg/dL (ref 8.4–10.5)
CHLORIDE: 100 meq/L (ref 96–112)
CO2: 26 meq/L (ref 19–32)
Creatinine, Ser: 0.71 mg/dL (ref 0.50–1.10)
GFR calc Af Amer: >90 mL/min (ref >90)
GFR calc non Af Amer: 90 mL/min — ABNORMAL LOW (ref >90)
Glucose, Bld: 158 mg/dL — ABNORMAL HIGH (ref 70–99)
POTASSIUM: 4 meq/L (ref 3.7–5.3)
SODIUM: 141 meq/L (ref 137–147)

## 2014-01-20 LAB — GLUCOSE, CAPILLARY: GLUCOSE-CAPILLARY: 137 mg/dL — AB (ref 70–99)

## 2014-01-20 MED ORDER — METFORMIN HCL 500 MG PO TABS: 500.0000 mg | ORAL_TABLET | Freq: Two times a day (BID) | ORAL | Status: DC

## 2014-01-20 MED ORDER — METFORMIN HCL 500 MG PO TABS
500.0000 mg | ORAL_TABLET | Freq: Two times a day (BID) | ORAL | Status: DC
  Filled 2014-01-20 (×3): qty 1

## 2014-01-20 MED ORDER — ACETAMINOPHEN 325 MG PO TABS: 650.0000 mg | ORAL_TABLET | ORAL | Status: DC | PRN

## 2014-01-20 MED ORDER — METOPROLOL TARTRATE 50 MG PO TABS: 50.0000 mg | ORAL_TABLET | Freq: Two times a day (BID) | ORAL | Status: DC

## 2014-01-20 MED ORDER — NITROGLYCERIN 0.4 MG SL SUBL: 0.4000 mg | SUBLINGUAL_TABLET | SUBLINGUAL | Status: DC | PRN

## 2014-01-20 MED ORDER — TICAGRELOR 90 MG PO TABS: 90.0000 mg | ORAL_TABLET | Freq: Two times a day (BID) | ORAL | Status: DC

## 2014-01-20 MED ORDER — ATORVASTATIN CALCIUM 80 MG PO TABS: 80.0000 mg | ORAL_TABLET | Freq: Every day | ORAL | Status: DC

## 2014-01-20 MED ORDER — LISINOPRIL 5 MG PO TABS: 5.0000 mg | ORAL_TABLET | Freq: Every day | ORAL | Status: DC

## 2014-01-20 MED ORDER — ASPIRIN 81 MG PO CHEW: 81.0000 mg | CHEWABLE_TABLET | Freq: Every day | ORAL | Status: DC

## 2014-01-20 NOTE — Progress Notes (Signed)
CARDIAC REHAB PHASE I   PRE:  Rate/Rhythm: 100    BP: sitting 120/61    SaO2: 96 RA  MODE:  Ambulation: 420 ft   POST:  Rate/Rhythm: 112    BP: sitting 119/55     SaO2: 97 RA   Steady. HR more controlled. Pt still with SOB which she sts she has had for a while with exercise. No CP. Answered questions with good reception.  [phone]  Darrick Meigs CES, ACSM 01/20/2014 9:35 AM

## 2014-01-20 NOTE — Progress Notes (Signed)
Tele box 3E28 removed and returned to cubby. Will continue to monitor.

## 2014-01-20 NOTE — Progress Notes (Signed)
Dr. Lennie Muckle returned page. Explained to doctor events that took place with patient regarding chest pain. Pt described chest pain only occurred when she was breathing and at the time pain was 7/10.  EKG was done. Pain eventually and quickly begin to subside down to a 2-3/10 and patient refused any pain medication once pain was almost gone as stated by patient. Orders given to continue to monitor patient and PRN order given in the case that patient changes her mind. Will continue to monitor patient to end of shift.

## 2014-01-20 NOTE — Discharge Summary (Signed)
Physician Discharge Summary  Patient ID: COURTLYN AKI MRN: [id number] DOB/AGE: 03-14-1951 63 y.o.  Primary Cardiologist: Dr. Gwenlyn Found PCP: Dr. Jonathon Jordan  Admit date: 01/17/2014 Discharge date: 01/20/2014   Admission Diagnoses: STEMI  Discharge Diagnoses:  Principal Problem:   ST elevation myocardial infarction (STEMI) of inferolateral wall Active Problems:   Diabetes   Dyslipidemia   HTN (hypertension)   CAD S/P percutaneous coronary angioplasty - inferior STEMI: Mid RCA PCI Promus DES 3.0 mm x 12 mm (2.5 mm)   Metabolic syndrome   Discharged Condition: stable  Hospital Course: Mariah Kelley is a 63 y.o. female w/ PMHx s/f HTN, HLD, prior tobacco abuse, obesity and family h/o premature CAD who was admitted to [facility] hospital on 01/17/14 for STEMI.   She was in her USOH until ~2AM the morning of arrival when she experienced sudden onset substernal chest pressure radiating to her neck and arms bilaterally w/ associated nausea and vomiting. The discomfort persisted through the morning. She took an ASA 81mg  ~0600 w/ minimal relief. When the discomfort intensified to a 7-8/10, she sought medical care.   In the ED, EKG revealed NSR w/ 15mm ST elevations in II, III, aVF and V5, V6 meeting diagnostic criteria for an inferolateral STEMI. Code STEMI was called, and she was transported emergently to the cardiac cath lab. She received an additional 243 mg of ASA and heparin bolus. The procedure was performed by Dr. Gwenlyn Found via the right radial artery. She was found to have an ulcerative plaque in the mid dominant RCA, but otherwise minimal CAD. She underwent sucessful PCI + stenting, utilizing a DES, to treat the RCA lesion. The overall LVEF was estimated at 55% with wall motion abnormalities notable for mild inferior hyopkinesia. Her chest pain resolved post PCI. She left the cath lab in stable condtion. She was started on DAPT with ASA + Brilinta. She was also placed on a BB, an  ACE-I and high dose statin therapy. She had no post cath complications. Right radial artery remained stable, as did renal function, HR and BP. It should be noted that her Hgb A1c was checked and was elevated at 6.4. The decision was made to initiate Metformin, however this was delayed until >48 hrs post cath. Cardiac rehab was consulted to assist with phase I rehab. She had no difficulties ambulating and had no recurrent, exertional chest pain or SOB. On hospital day 3, she was seen and examined by Dr. Ellyn Hack, who determined she was stable for discharge home. She will have 2 week post-hospital f/u with Lyda Jester, PA-C, on 02/03/14. She will f/u with Dr. Gwenlyn Found in 6 weeks.    Consults: None  Significant Diagnostic Studies:   Emergent LHC 01/17/14 HEMODYNAMICS:  AO SYSTOLIC/AO DIASTOLIC: 546/56  LV SYSTOLIC/LV DIASTOLIC: 812/75  ANGIOGRAPHIC RESULTS:  1. Left main; normal  2. LAD; 30-40% mid at the takeoff of the first diagonal branch which appeared to have 40-50% ostial stenosis. Was a moderate-sized branch  3. Left circumflex; nondominant and free of significant disease.  4. Right coronary artery; dominant with a 80% hypodense lesion in the midportion. This was most likely the "culprit vessel".  5. Left ventriculography; RAO left ventriculogram was performed using  25 mL of Visipaque dye at 12 mL/second. The overall LVEF estimated  55 % With wall motion abnormalities notable for mild inferior hypokinesia   Cardiac Panel (last 3 results)  Recent Labs  01/18/14 0009 01/20/14 0830  TROPONINI 17.87* 3.61*   Lipid Panel  Component Value Date/Time   CHOL 231* 01/18/2014 0314   TRIG 127 01/18/2014 0314   HDL 49 01/18/2014 0314   CHOLHDL 4.7 01/18/2014 0314   VLDL 25 01/18/2014 0314   LDLCALC 157* 01/18/2014 0314     Treatments: See Hospital Course  Discharge Exam: Blood pressure 126/49, pulse 91, temperature 97.9 F (36.6 C), temperature source Oral, resp. rate 18, height 5'  9" (1.753 m), weight 283 lb 4.7 oz (128.5 kg), SpO2 96.00%.   Disposition: 01-Home or Self Care      Discharge Orders   Future Appointments Provider Department Dept Phone   02/03/2014 10:00 AM Brittainy Ladoris [facility] [phone]   02/17/2014 2:30 PM Lorretta Harp, MD [facility] [phone]   Future Orders Complete By Expires   Amb Referral to Cardiac Rehabilitation  As directed    Diet - low sodium heart healthy  As directed    Increase activity slowly  As directed        Medication List    STOP taking these medications       ibuprofen 200 MG tablet  Commonly known as:  ADVIL,MOTRIN      TAKE these medications       acetaminophen 325 MG tablet  Commonly known as:  TYLENOL  Take 650 mg by mouth every 6 (six) hours as needed for mild pain.     aspirin 81 MG chewable tablet  Chew 1 tablet (81 mg total) by mouth daily.     atorvastatin 80 MG tablet  Commonly known as:  LIPITOR  Take 1 tablet (80 mg total) by mouth daily at 6 PM.     lisinopril 5 MG tablet  Commonly known as:  PRINIVIL,ZESTRIL  Take 1 tablet (5 mg total) by mouth daily.     metFORMIN 500 MG tablet  Commonly known as:  GLUCOPHAGE  Take 1 tablet (500 mg total) by mouth 2 (two) times daily with a meal.     metFORMIN 500 MG tablet  Commonly known as:  GLUCOPHAGE  Take 1 tablet (500 mg total) by mouth 2 (two) times daily with a meal.  Start taking on:  01/21/2014     metoprolol 50 MG tablet  Commonly known as:  LOPRESSOR  Take 1 tablet (50 mg total) by mouth 2 (two) times daily.     nitroGLYCERIN 0.4 MG SL tablet  Commonly known as:  NITROSTAT  Place 1 tablet (0.4 mg total) under the tongue every 5 (five) minutes as needed for chest pain.     Ticagrelor 90 MG Tabs tablet  Commonly known as:  BRILINTA  Take 1 tablet (90 mg total) by mouth 2 (two) times daily.     Ticagrelor 90 MG Tabs tablet  Commonly known as:  BRILINTA  Take 1 tablet (90 mg total) by  mouth 2 (two) times daily.       Follow-up Information   Follow up with Lyda Jester, PA-C On 02/03/2014. ( 10:00 am with Dr. Kennon Holter PA )    Specialty:  Cardiology   Contact information:   [place] [place] [address] [phone]       Follow up with Lorretta Harp, MD On 02/17/2014. (2:30 pm )    Specialty:  Cardiology   Contact information:   [place] [address] [place], INCLUDING PHYSICIAN TIME: >30 MINUTES  Signed: Lyda Jester 01/20/2014, 4:31 PM

## 2014-01-20 NOTE — Progress Notes (Signed)
For the record patient walked a second time prior to going to bed. Oxygen saturation was 96% before walking on room air and was 98% after walking on room air. After patient walked she sat in chair, then later went to bed. Currently patient is sleeping and has not had any more occurrences of chest pain with activity. Will continue to monitor to end of shift.

## 2014-01-20 NOTE — Progress Notes (Addendum)
Patient ID: Mariah Kelley, female   DOB: [birth year], 63 y.o.   MRN: [id number]  63 year old female with hypertension and hyperlipidemia as well as tobacco abuse and family history of premature coronary disease who presented with inferior STEMI on 01/17/2014. She was taken emergently to the cardiac catheterization lab where she was found to have a 80% hypodense mid RCA lesion that is felt to be the culprit lesion. This was treated with a Promus Premier DES 3.0 mm x 12 mm (deployed to roughly 3.5 mm). Ejection fraction of the time of cardiac catheterization ventriculography was estimated at 55% with inferior hypokinesia.  SUBJECTIVE:    Again - 1 episode of CP last PM - no ECG changes; exacerbated with deep inspiration.  Resolved spontaneously.   NO Pain with ambulation shortly after.   Currently feels good.  OBJECTIVE:   Vitals:   Filed Vitals:   01/19/14 2108 01/19/14 2115 01/19/14 2234 01/20/14 0640  BP: 138/65  139/73 126/49  Pulse: 96  86 91  Temp: 98 F (36.7 C)   97.9 F (36.6 C)  TempSrc: Oral   Oral  Resp: 18  20 18   Height:      Weight:    283 lb 4.7 oz (128.5 kg)  SpO2: 96% 98% 97% 96%   I&O's:    Intake/Output Summary (Last 24 hours) at 01/20/14 1607 Last data filed at 01/20/14 3710  Gross per 24 hour  Intake   1143 ml  Output   2050 ml  Net   -907 ml   TELEMETRY: Reviewed telemetry pt in NSR:  PHYSICAL EXAM General: Well developed, well nourished, in no acute distress Head: Eyes PERRLA, No xanthomas.   [place]/AT Lungs:   CTA B. auscultation and percussion. Nonlabored, good air movement Heart:   RRR S1 S2 Pulses are 2+ & equal. Abdomen: Bowel sounds are positive, abdomen soft and non-tender without masses  Extremities:   No clubbing, cyanosis or edema.  DP +1 Neuro: Alert and oriented X 3. Psych:  Good affect, responds appropriately  LABS: Basic Metabolic Panel:  Recent Labs  01/17/14 1539 01/18/14 0314  NA  --  140  K  --  4.2  CL  --  102  CO2   --  25  GLUCOSE  --  124*  BUN  --  15  CREATININE  --  0.67  CALCIUM  --  9.4  MG 1.7  --    Liver Function Tests: No results found for this basename: AST, ALT, ALKPHOS, BILITOT, PROT, ALBUMIN,  in the last 72 hours No results found for this basename: LIPASE, AMYLASE,  in the last 72 hours CBC:  Recent Labs  01/18/14 0314  WBC 10.8*  HGB 13.0  HCT 39.4  MCV 87.9  PLT 306   Cardiac Enzymes:  Recent Labs  01/17/14 1006 01/17/14 1539 01/18/14 0009  TROPONINI 0.90* 9.37* 17.87*   BNP: No components found with this basename: POCBNP,  D-Dimer: No results found for this basename: DDIMER,  in the last 72 hours Hemoglobin A1C:  Recent Labs  01/17/14 1539  HGBA1C 6.4*   Fasting Lipid Panel:  Recent Labs  01/18/14 0314  CHOL 231*  HDL 49  LDLCALC 157*  TRIG 127  CHOLHDL 4.7   Thyroid Function Tests:  Recent Labs  01/17/14 1539  TSH 1.318   Anemia Panel: No results found for this basename: VITAMINB12, FOLATE, FERRITIN, TIBC, IRON, RETICCTPCT,  in the last 72 hours Coag Panel:   No  results found for this basename: INR,  PROTIME    RADIOLOGY: No results found. Cardiac Cath-PCI3/14/2015 -: Left main; normal, LAD; 30-40% mid at the takeoff of the first diagonal branch which appeared to have 40-50% ostial stenosis. Was a moderate-sized branch, Left circumflex; nondominant and free of significant disease, Right coronary artery; dominant with a 80% hypodense lesion in the midportion. This was most likely the "culprit vessel".  EF was 55 % with mild inferior hypokinesia. She underwent PCI DES [employer])  to mid RCA.  ASSESSMENT AND PLAN:  Principal Problem:   ST elevation myocardial infarction (STEMI) of inferolateral wall Active Problems:   Diabetes   CAD S/P percutaneous coronary angioplasty - inferior STEMI: Mid RCA PCI Promus DES 3.0 mm x 12 mm (2.5 mm)   HTN (hypertension)   Dyslipidemia   Metabolic syndrome  MSK CP - worse with inspiration.  Has  noted cough over last several days leading up to admission.  1. Inferolateral STEMI - cath showed l  Currently pain free. - continue ASA/Brilinta/beta blocker/ACE I/statin - cycle enzymes until they peak 2. Hypertension - Improved today  -  Lisinopril to 10mg  daily yesterday along with 25mg  bid Lopressor -- with need for better rate control, will increase BB to 50 mg bid & reduce ACE-I to 5mg . - rate still increased with bathing --would anticipate being able to further titrate BB dose as OP 3. Hyperlipidemia  - continue atorvastatin 80 mg -- recheck in ~36months. 4. H/o tobacco abuse  - quit prior to MI. 5. Obesity - CRH consult Phase 2 -- will benefit from Nutrition counseling. 6. Newly diagnosed DM with elevated HbgA1C (6.4 -- at least Metabolic Syndrome) - cover BS with SS Insulin - Initiate low dose Metformin at discharge (order written to start today)  Stable today - [facility] to ambulate pt.  Anticipate d/c this AM. Should be OK to go to training seminar in [place] on Mon 3/23.   Leonie Man, MD  01/20/2014  8:26 AM

## 2014-01-21 ENCOUNTER — Encounter (HOSPITAL_COMMUNITY): Payer: Self-pay | Admitting: Cardiology

## 2014-01-21 NOTE — Discharge Summary (Signed)
I saw examined patient on day of discharge. She was stable for discharge. I agree with the summary  As dictated by Ms. Rosita Fire, PA-C.  Admitted with inferolateral STEMI, with PCI of the RCA with a DES stent  (3.0 mm x 12 mm postdilated 3.5 mm.  She did relatively well post catheterization, and was initiated on appropriate cardiac medications. She did have at least 2 episodes of discomfort in her chest that was associated with inspiration. These resolve spontaneously. She did have a mildly elevated hemoglobin A1c in was started on metformin on discharge.  She intends to undergo cardiac rehabilitation. She is asked permission to go to [place] next week for a training seminoma, and I granted this request.  I agree with the discharge summary findings. She'll followup as scheduled with Ms. Simmons and Dr. Gwenlyn Found.  Leonie Man, M.D., M.S. Interventional Cardiologist  [place] Pager # [phone] 01/21/2014

## 2014-02-03 ENCOUNTER — Ambulatory Visit (INDEPENDENT_AMBULATORY_CARE_PROVIDER_SITE_OTHER): Payer: BC Managed Care – PPO | Admitting: Cardiology

## 2014-02-03 ENCOUNTER — Encounter: Payer: Self-pay | Admitting: Cardiology

## 2014-02-03 VITALS — BP 134/80 | HR 80 | Ht 69.0 in | Wt 274.5 lb

## 2014-02-03 DIAGNOSIS — I10 Essential (primary) hypertension: Secondary | ICD-10-CM

## 2014-02-03 DIAGNOSIS — I2510 Atherosclerotic heart disease of native coronary artery without angina pectoris: Secondary | ICD-10-CM

## 2014-02-03 DIAGNOSIS — I2119 ST elevation (STEMI) myocardial infarction involving other coronary artery of inferior wall: Secondary | ICD-10-CM

## 2014-02-03 DIAGNOSIS — E785 Hyperlipidemia, unspecified: Secondary | ICD-10-CM

## 2014-02-03 DIAGNOSIS — I25810 Atherosclerosis of coronary artery bypass graft(s) without angina pectoris: Secondary | ICD-10-CM

## 2014-02-03 DIAGNOSIS — Z9861 Coronary angioplasty status: Secondary | ICD-10-CM

## 2014-02-03 DIAGNOSIS — E119 Type 2 diabetes mellitus without complications: Secondary | ICD-10-CM

## 2014-02-03 DIAGNOSIS — I219 Acute myocardial infarction, unspecified: Secondary | ICD-10-CM

## 2014-02-03 NOTE — Patient Instructions (Signed)
Continue taking medications as prescribed Follow-up with Dr. Gwenlyn Found on 02/17/14

## 2014-02-06 NOTE — Progress Notes (Signed)
Patient ID: Mariah Kelley, female   DOB: November 28, 1950, 63 y.o.   MRN: [id number]    02/06/2014 NAUTICA HOTZ   [birth year]  [id number]  Primary Physicia [name], MD Primary Cardiologist: Dr. Gwenlyn Found  Ms. Henery presents to clinic today for post-hospital f/u after a recent admission for STEMI.   HPI:  Mariah Kelley is a 63 y.o. female w/ PMHx s/f HTN, HLD, prior tobacco abuse, obesity and family h/o premature CAD who was admitted to [facility] hospital on 01/17/14 for STEMI.   She was in her USOH until ~2AM the morning of arrival when she experienced sudden onset substernal chest pressure radiating to her neck and arms bilaterally w/ associated nausea and vomiting. The discomfort persisted through the morning. She took an ASA 81mg  ~0600 w/ minimal relief. When the discomfort intensified to a 7-8/10, she sought medical care.   In the ED, EKG revealed NSR w/ 75mm ST elevations in II, III, aVF and V5, V6 meeting diagnostic criteria for an inferolateral STEMI. Code STEMI was called, and she was transported emergently to the cardiac cath lab. She received an additional 243 mg of ASA and heparin bolus. The procedure was performed by Dr. Gwenlyn Found via the right radial artery. She was found to have an ulcerative plaque in the mid dominant RCA, but otherwise minimal CAD. She underwent sucessful PCI + stenting, utilizing a DES, to treat the RCA lesion. The overall LVEF was estimated at 55% with wall motion abnormalities notable for mild inferior hyopkinesia. Her chest pain resolved post PCI.  She was started on DAPT with ASA + Brilinta. She was also placed on a BB, an ACE-I and high dose statin therapy. She had no post cath complications. Right radial artery remained stable, as did renal function, HR and BP. It should be noted that her Hgb A1c was checked and was elevated at 6.4. The decision was made to initiate Metformin, however this was delayed until >48 hrs post cath. She was discharged home  on 01/17/14.  She presents to clinic today w/o complaints. She states that she has been doing well. She denies any recurrent CP or SOB. She has been compliant with her medications including Brilinta BID.    Current Outpatient Prescriptions  Medication Sig Dispense Refill  . acetaminophen (TYLENOL) 325 MG tablet Take 650 mg by mouth every 6 (six) hours as needed for mild pain.      [name] aspirin 81 MG chewable tablet Chew 1 tablet (81 mg total) by mouth daily.      [name] atorvastatin (LIPITOR) 80 MG tablet Take 1 tablet (80 mg total) by mouth daily at 6 PM.  30 tablet  5  . lisinopril (PRINIVIL,ZESTRIL) 5 MG tablet Take 1 tablet (5 mg total) by mouth daily.  30 tablet  5  . metFORMIN (GLUCOPHAGE) 500 MG tablet Take 1 tablet (500 mg total) by mouth 2 (two) times daily with a meal.  60 tablet  5  . metoprolol (LOPRESSOR) 50 MG tablet Take 1 tablet (50 mg total) by mouth 2 (two) times daily.  60 tablet  5  . nitroGLYCERIN (NITROSTAT) 0.4 MG SL tablet Place 1 tablet (0.4 mg total) under the tongue every 5 (five) minutes as needed for chest pain.  25 tablet  2  . Ticagrelor (BRILINTA) 90 MG TABS tablet Take 1 tablet (90 mg total) by mouth 2 (two) times daily.  60 tablet  10   No current facility-administered medications for this visit.    Allergies  Allergen Reactions  . Prednisone Other (See Comments)    "made poison ivy worse"    History   Social History  . Marital Status: Single    Spouse Name: N/A    Number of Children: N/A  . Years of Education: N/A   Occupational History  . Social Worker    Social History Main Topics  . Smoking status: Former Smoker -- 1.00 packs/day for 40 years    Types: Cigarettes  . Smokeless tobacco: Former Systems developer    Quit date: 01/17/2013  . Alcohol Use: No  . Drug Use: No  . Sexual Activity: Not on file   Other Topics Concern  . Not on file   Social History Narrative   Divorced, 2 children.      Review of Systems: General: negative for chills, fever,  night sweats or weight changes.  Cardiovascular: negative for chest pain, dyspnea on exertion, edema, orthopnea, palpitations, paroxysmal nocturnal dyspnea or shortness of breath Dermatological: negative for rash Respiratory: negative for cough or wheezing Urologic: negative for hematuria Abdominal: negative for nausea, vomiting, diarrhea, bright red blood per rectum, melena, or hematemesis Neurologic: negative for visual changes, syncope, or dizziness All other systems reviewed and are otherwise negative except as noted above.    Blood pressure 134/80, pulse 80, height 5\' 9"  (1.753 m), weight 274 lb 8 oz (124.512 kg).  General appearance: alert, cooperative and no distress Neck: no carotid bruit and no JVD Lungs: clear to auscultation bilaterally Heart: regular rate and rhythm, S1, S2 normal, no murmur, click, rub or gallop Extremities: no LEE Pulses: 2+ and symmetric Skin: warm and dry Neurologic: Grossly normal  EKG NSR; HR 80. No ischemic changes  ASSESSMENT AND PLAN:   CAD S/P percutaneous coronary angioplasty - inferior STEMI: Mid RCA PCI Promus DES 3.0 mm x 12 mm (3.5 mm) Denies recurrent CP. Continue DAPT with ASA + Brilinta, BB, ACE and statin.  HTN (hypertension) Well controlled. Continue BB and ACE.   Dyslipidemia LDL elevated at 157. Goal < 70. Continue statin.   Diabetes Continue Meformin. F/u with PCP. Hgb A1c goal <70     PLAN  Pt is doing well post STEMI. Post recovery course w/o complications and w/o recurrent CP. Continue plan as outlined above. F/u with Dr. Gwenlyn Found in 6-4 weeks.   Melbeta, Collyer 02/06/2014 8:24 PM

## 2014-02-06 NOTE — Assessment & Plan Note (Signed)
Continue Meformin. F/u with PCP. Hgb A1c goal <70

## 2014-02-06 NOTE — Assessment & Plan Note (Signed)
LDL elevated at 157. Goal < 70. Continue statin.

## 2014-02-06 NOTE — Assessment & Plan Note (Signed)
Well controlled. Continue BB and ACE.

## 2014-02-06 NOTE — Assessment & Plan Note (Addendum)
Denies recurrent CP. Continue DAPT with ASA + Brilinta, BB, ACE and statin.

## 2014-02-12 ENCOUNTER — Encounter (HOSPITAL_COMMUNITY)
Admission: RE | Admit: 2014-02-12 | Discharge: 2014-02-12 | Disposition: A | Discharge status: Home / Self Care | Payer: BC Managed Care – PPO | Source: Ambulatory Visit | Attending: Cardiovascular Disease | Admitting: Cardiovascular Disease

## 2014-02-12 DIAGNOSIS — Z5189 Encounter for other specified aftercare: Secondary | ICD-10-CM | POA: Insufficient documentation

## 2014-02-12 DIAGNOSIS — E785 Hyperlipidemia, unspecified: Secondary | ICD-10-CM | POA: Insufficient documentation

## 2014-02-12 DIAGNOSIS — Z9861 Coronary angioplasty status: Secondary | ICD-10-CM | POA: Insufficient documentation

## 2014-02-12 DIAGNOSIS — I219 Acute myocardial infarction, unspecified: Secondary | ICD-10-CM | POA: Insufficient documentation

## 2014-02-12 NOTE — Progress Notes (Signed)
Cardiac Rehab Medication Review by a Pharmacist  Does the patient  feel that his/her medications are working for him/her?  yes  Has the patient been experiencing any side effects to the medications prescribed?  Yes; in evenings feels that blood pressure is low, when checks it previously 107/80s.  Also gets SOB with Brilinta but states cardiologist told her to have a small cup of caffeine with medication and she states the SOB has drastically improved with caffeine.  Does the patient measure his/her own blood pressure or blood glucose at home?  yes   Does the patient have any problems obtaining medications due to transportation or finances?   no  Understanding of regimen: excellent Understanding of indications: excellent Potential of compliance: excellent    Pharmacist comments: Patient is very conscious of reasons the medications are important.  She has been making appropriate lifestyle changes as well.  Counseled that sometimes takes the body a few months to get used to beta-blocker. Overall she is doing well.    Thank you, Vivia Ewing, PharmD Clinical Pharmacist - Resident Pager: [phone] Pharmacy: [phone] 02/12/2014 9:27 AM

## 2014-02-16 ENCOUNTER — Encounter (HOSPITAL_COMMUNITY)
Admission: RE | Admit: 2014-02-16 | Discharge: 2014-02-16 | Disposition: A | Discharge status: Home / Self Care | Payer: BC Managed Care – PPO | Source: Ambulatory Visit | Attending: Cardiovascular Disease | Admitting: Cardiovascular Disease

## 2014-02-16 LAB — GLUCOSE, CAPILLARY
GLUCOSE-CAPILLARY: 115 mg/dL — AB (ref 70–99)
Glucose-Capillary: 99 mg/dL (ref 70–99)

## 2014-02-16 NOTE — Progress Notes (Signed)
Pt in today for her first day of exercise at the 6:45 exercise class time.  Pt tolerated light exercise with no complaints.  Pt admits to working at much higher workloads at [employer].  Advised pt to scale back her exercise routine until she was evaluated here in rehab and the exercise specialist met with her to talk about home exercise.  Pt verbalized understanding and appreciated the exercise guidance.  Monitor shows Sr with slight st depression. Reviewed PHQ2 score - 0.  Pt feels very positive and "happy to have a second chance to make better choices." Pt short term goal is to lose 10 pounds.  This is very achievable due to pt's commitment to making healthier lifestyle changes and pt boast a weight loss so far of 20 pounds.  Pt long term goal is to continue losing weight, decrease sob and to get more comfortable with exercise.  Will continue to monitor pt's progress toward achieving this goal. Maurice Small RN, BSN

## 2014-02-17 ENCOUNTER — Encounter: Payer: Self-pay | Admitting: Cardiovascular Disease

## 2014-02-17 ENCOUNTER — Ambulatory Visit (INDEPENDENT_AMBULATORY_CARE_PROVIDER_SITE_OTHER): Payer: BC Managed Care – PPO | Admitting: Cardiovascular Disease

## 2014-02-17 VITALS — BP 152/83 | HR 79 | Ht 69.0 in | Wt 268.0 lb

## 2014-02-17 DIAGNOSIS — I10 Essential (primary) hypertension: Secondary | ICD-10-CM

## 2014-02-17 DIAGNOSIS — Z9861 Coronary angioplasty status: Secondary | ICD-10-CM

## 2014-02-17 DIAGNOSIS — I2510 Atherosclerotic heart disease of native coronary artery without angina pectoris: Secondary | ICD-10-CM

## 2014-02-17 DIAGNOSIS — E785 Hyperlipidemia, unspecified: Secondary | ICD-10-CM

## 2014-02-17 MED ORDER — TICAGRELOR 90 MG PO TABS: 90.0000 mg | ORAL_TABLET | Freq: Two times a day (BID) | ORAL | Status: DC

## 2014-02-17 NOTE — Assessment & Plan Note (Signed)
Markedly improved on high-dose statin therapy

## 2014-02-17 NOTE — Assessment & Plan Note (Signed)
The patient is now 45 month status post inferior STEMI 2 catheterization and intervention of her right coronary artery using a drug-eluting stent. The remainder of her coronary anatomy is free of significant disease and her of the function was preserved. She saw Ellen Henri on/3/15 is the first posthospital followup and was doing well. She has lost 20 pounds since her index event. She denies chest pain or shortness of breath. She is on dual  antiplatelet therapy with aspirin and Brilenta

## 2014-02-17 NOTE — Patient Instructions (Signed)
We request that you follow-up in: 6 months with Brittainy and in 1 year with Dr Berry.  You will receive a reminder letter in the mail two months in advance. If you don't receive a letter, please call our office to schedule the follow-up appointment.   

## 2014-02-17 NOTE — Progress Notes (Signed)
02/17/2014 Mariah Kelley   07-03-1951  [id number]  Primary Physician [name], MD Primary Cardiologist: [name] Renae Gloss   HPI:  Mariah Kelley is a 63 y.o. female w/ PMHx s/f HTN, HLD, prior tobacco abuse, obesity and family h/o premature CAD who is admitted to [facility] today for STEMI.  She was in her USOH until ~2AM this morning when she experienced sudden onset substernal chest pressure radiating to her neck and arms bilaterally w/ associated nausea and vomiting. The discomfort persisted through the morning. She took an ASA 81mg  ~0600 w/ minimal relief. When the discomfort intensified to a 7-8/10, she sought medical care.  She has no prior cardiac history. She reports smoking 1 PPD x 40 years, no current tobacco abuse. Mother with MI in her 52s, father with MI at 56, brothers w/ CAD. Previously on medication for high cholesterol. No current medications.  In the ED, EKG revealed NSR w/ 13mm ST elevations in II, III, aVF and V5, V6 meeting diagnostic criteria for an inferolateral STEMI. Code STEMI was called, and she was transported emergently to the cardiac cath lab. I placed a drug-eluting stent in her dominant mid RCA for a hazy 80% plaque. The remainder of her coronary anatomy was free of significant disease in her function was preserved. She was discharged in stable condition. Since that time she has seen [place]  PA-C in the office in followup. She does deny chest pain or shortness of breath. She has lost 20 pounds as made lifestyle modifications.    Current Outpatient Prescriptions  Medication Sig Dispense Refill  . acetaminophen (TYLENOL) 325 MG tablet Take 650 mg by mouth every 6 (six) hours as needed for mild pain.      [name] aspirin 81 MG chewable tablet Chew 81 mg by mouth daily.      [name] atorvastatin (LIPITOR) 80 MG tablet Take 80 mg by mouth daily at 6 PM.      . lisinopril (PRINIVIL,ZESTRIL) 5 MG tablet Take 5 mg  by mouth daily.      . metFORMIN (GLUCOPHAGE) 500 MG tablet Take 500 mg by mouth 2 (two) times daily with a meal.      . metoprolol (LOPRESSOR) 50 MG tablet Take 50 mg by mouth 2 (two) times daily.      . nitroGLYCERIN (NITROSTAT) 0.4 MG SL tablet Place 1 tablet (0.4 mg total) under the tongue every 5 (five) minutes as needed for chest pain.  25 tablet  2  . Ticagrelor (BRILINTA) 90 MG TABS tablet Take 1 tablet (90 mg total) by mouth 2 (two) times daily.  60 tablet  11   No current facility-administered medications for this visit.    Allergies  Allergen Reactions  . Prednisone Other (See Comments)    "made poison ivy worse", rebound effect when take prednisone    History   Social History  . Marital Status: Single    Spouse Name: N/A    Number of Children: N/A  . Years of Education: N/A   Occupational History  . Social Worker    Social History Main Topics  . Smoking status: Former Smoker -- 1.00 packs/day for 40 years    Types: Cigarettes  . Smokeless tobacco: Former Systems developer    Quit date: 01/17/2013  . Alcohol Use: No  . Drug Use: No  . Sexual Activity: Not on file   Other Topics Concern  . Not on file   Social History  Narrative   Divorced, 2 children.      Review of Systems: General: negative for chills, fever, night sweats or weight changes.  Cardiovascular: negative for chest pain, dyspnea on exertion, edema, orthopnea, palpitations, paroxysmal nocturnal dyspnea or shortness of breath Dermatological: negative for rash Respiratory: negative for cough or wheezing Urologic: negative for hematuria Abdominal: negative for nausea, vomiting, diarrhea, bright red blood per rectum, melena, or hematemesis Neurologic: negative for visual changes, syncope, or dizziness All other systems reviewed and are otherwise negative except as noted above.    Blood pressure 152/83, pulse 79, height 5\' 9"  (1.753 m), weight 268 lb (121.564 kg).  General appearance: alert and no  distress Neck: no adenopathy, no carotid bruit, no JVD, supple, symmetrical, trachea midline and thyroid not enlarged, symmetric, no tenderness/mass/nodules Lungs: clear to auscultation bilaterally Heart: regular rate and rhythm, S1, S2 normal, no murmur, click, rub or gallop Extremities: extremities normal, atraumatic, no cyanosis or edema  EKG not performed today  ASSESSMENT AND PLAN:   CAD S/P percutaneous coronary angioplasty - inferior STEMI: Mid RCA PCI Promus DES 3.0 mm x 12 mm (3.5 mm) The patient is now 71 month status post inferior STEMI 2 catheterization and intervention of her right coronary artery using a drug-eluting stent. The remainder of her coronary anatomy is free of significant disease and her of the function was preserved. She saw Ellen Henri on/3/15 is the first posthospital followup and was doing well. She has lost 20 pounds since her index event. She denies chest pain or shortness of breath. She is on dual  antiplatelet therapy with aspirin and Brilenta  HTN (hypertension) Controlled on current medications  Dyslipidemia Markedly improved on high-dose statin therapy      [name] [facility], [place] [facility] 02/17/2014 2:39 PM

## 2014-02-17 NOTE — Assessment & Plan Note (Signed)
Controlled on current medications 

## 2014-02-18 ENCOUNTER — Encounter (HOSPITAL_COMMUNITY)
Admission: RE | Admit: 2014-02-18 | Discharge: 2014-02-18 | Disposition: A | Discharge status: Home / Self Care | Payer: BC Managed Care – PPO | Source: Ambulatory Visit | Attending: Cardiovascular Disease | Admitting: Cardiovascular Disease

## 2014-02-18 LAB — GLUCOSE, CAPILLARY
Glucose-Capillary: 114 mg/dL — ABNORMAL HIGH (ref 70–99)
Glucose-Capillary: 106 mg/dL — ABNORMAL HIGH (ref 70–99)

## 2014-02-20 ENCOUNTER — Encounter (HOSPITAL_COMMUNITY)
Admission: RE | Admit: 2014-02-20 | Discharge: 2014-02-20 | Disposition: A | Discharge status: Home / Self Care | Payer: BC Managed Care – PPO | Source: Ambulatory Visit | Attending: Cardiovascular Disease | Admitting: Cardiovascular Disease

## 2014-02-20 LAB — GLUCOSE, CAPILLARY: Glucose-Capillary: 108 mg/dL — ABNORMAL HIGH (ref 70–99)

## 2014-02-23 ENCOUNTER — Encounter (HOSPITAL_COMMUNITY)
Admission: RE | Admit: 2014-02-23 | Discharge: 2014-02-23 | Disposition: A | Discharge status: Home / Self Care | Payer: BC Managed Care – PPO | Source: Ambulatory Visit | Attending: Cardiovascular Disease | Admitting: Cardiovascular Disease

## 2014-02-25 ENCOUNTER — Encounter (HOSPITAL_COMMUNITY)
Admission: RE | Admit: 2014-02-25 | Discharge: 2014-02-25 | Disposition: A | Discharge status: Home / Self Care | Payer: BC Managed Care – PPO | Source: Ambulatory Visit | Attending: Cardiovascular Disease | Admitting: Cardiovascular Disease

## 2014-02-25 ENCOUNTER — Encounter (HOSPITAL_COMMUNITY): Payer: BC Managed Care – PPO

## 2014-02-25 LAB — GLUCOSE, CAPILLARY: Glucose-Capillary: 110 mg/dL — ABNORMAL HIGH (ref 70–99)

## 2014-02-27 ENCOUNTER — Encounter (HOSPITAL_COMMUNITY)
Admission: RE | Admit: 2014-02-27 | Discharge: 2014-02-27 | Disposition: A | Discharge status: Home / Self Care | Payer: BC Managed Care – PPO | Source: Ambulatory Visit | Attending: Cardiovascular Disease | Admitting: Cardiovascular Disease

## 2014-03-02 ENCOUNTER — Encounter (HOSPITAL_COMMUNITY)
Admission: RE | Admit: 2014-03-02 | Discharge: 2014-03-02 | Disposition: A | Discharge status: Home / Self Care | Payer: BC Managed Care – PPO | Source: Ambulatory Visit | Attending: Cardiovascular Disease | Admitting: Cardiovascular Disease

## 2014-03-02 NOTE — Progress Notes (Signed)
I have reviewed home exercise with Joycelyn Schmid. The patient was advised to walk and to continue her regular gym routine 2-4 days per week outside of CRP II for 30-60 minutes continuously.  Pt will also complete one additional day of hand weights outside of CRP II.  Progression of exercise prescription was discussed.  Reviewed THR, pulse, RPE, sign and symptoms, NTG use and when to call 911 or MD.  Pt voiced understanding.  [id number] Archie Endo, MS, ACSM RCEP 03/02/2014 8:14 AM

## 2014-03-04 ENCOUNTER — Encounter (HOSPITAL_COMMUNITY)
Admission: RE | Admit: 2014-03-04 | Discharge: 2014-03-04 | Disposition: A | Discharge status: Home / Self Care | Payer: BC Managed Care – PPO | Source: Ambulatory Visit | Attending: Cardiovascular Disease | Admitting: Cardiovascular Disease

## 2014-03-04 NOTE — Progress Notes (Signed)
Mariah Kelley 63 y.o. female Nutrition Note Spoke with pt. Pt is obese. Pt reports her wt was 292 lb prior to MI. Pt wt today 259 lb per pt. Pt with 33 lb wt loss since her MI. According to EMR, pt wt 01/20/14 283.3. Current wt is down 24 lb over the past 6 weeks. Rate of wt loss greater than desired rate. Pt states she is eating 10-15 carb choices per day (150-225 gm CHO daily) and consuming "more than 1200 kcal." Pt is measuring and weighing her food at each meal. Nutrition Plan and Nutrition Survey goals reviewed with pt. Pt is following Step 2 of the Therapeutic Lifestyle Changes diet. According to discussion with pt, pt is pre-diabetic "the A1c of 6.4 was the first I've had." Pt understood she was started on Metformin due to stent placement. Pt checks CBG's 4 times daily. Pt states pre-meal CBG's typically 100-105 mg/dL and has ranged from 92-133 mg/dL. This Probation officer went over Diabetes Education test results. Pt expressed understanding of the information reviewed. Pt aware of nutrition education classes offered and is unsure if she will be able to attend nutrition classes. Wt Readings from Last 3 Encounters:  02/17/14 268 lb (121.564 kg)  02/12/14 270 lb 8.1 oz (122.7 kg)  02/03/14 274 lb 8 oz (124.512 kg)   Nutrition Diagnosis   Food-and nutrition-related knowledge deficit related to lack of exposure to information as related to diagnosis of: ? CVD    Obesity related to excessive energy intake as evidenced by a BMI of 40.8  Nutrition RX/ Estimated Daily Nutrition Needs for: wt loss  1600-2100 Kcal, 40-55 gm fat, 10-16 gm sat fat, 1.5-2.1 gm trans-fat, <1500 mg sodium, 175-250 gm CHO   Nutrition Intervention   Pt's individual nutrition plan reviewed with pt.   Benefits of adopting Therapeutic Lifestyle Changes discussed when Medficts reviewed.   Pt to attend the Portion Distortion class   Pt to attend the  ? Nutrition I class                         ? Nutrition II class        ?  Diabetes Blitz class       ? Diabetes Q & A class   Pt given handouts for: ? Nutrition I class ? Nutrition II class ? Diabetes Blitz class   Continue client-centered nutrition education by RD, as part of interdisciplinary care. Goal(s)   Pt to identify food quantities necessary to achieve: ? wt loss to a goal wt of 246-264 lb (111.8-120 kg) at graduation from cardiac rehab.  Monitor and Evaluate progress toward nutrition goal with team. Nutrition Risk: Change to Moderate Derek Mound, M.Ed, RD, LDN, CDE 03/04/2014 7:46 AM

## 2014-03-06 ENCOUNTER — Encounter (HOSPITAL_COMMUNITY)
Admission: RE | Admit: 2014-03-06 | Discharge: 2014-03-06 | Disposition: A | Discharge status: Home / Self Care | Payer: BC Managed Care – PPO | Source: Ambulatory Visit | Attending: Cardiovascular Disease | Admitting: Cardiovascular Disease

## 2014-03-06 DIAGNOSIS — Z5189 Encounter for other specified aftercare: Secondary | ICD-10-CM | POA: Insufficient documentation

## 2014-03-06 DIAGNOSIS — I219 Acute myocardial infarction, unspecified: Secondary | ICD-10-CM | POA: Insufficient documentation

## 2014-03-06 DIAGNOSIS — Z9861 Coronary angioplasty status: Secondary | ICD-10-CM | POA: Insufficient documentation

## 2014-03-09 ENCOUNTER — Encounter (HOSPITAL_COMMUNITY)
Admission: RE | Admit: 2014-03-09 | Discharge: 2014-03-09 | Disposition: A | Discharge status: Home / Self Care | Payer: BC Managed Care – PPO | Source: Ambulatory Visit | Attending: Cardiovascular Disease | Admitting: Cardiovascular Disease

## 2014-03-09 NOTE — Progress Notes (Signed)
Pt with continued exertional bp with exercise.  Will send rehab report to Dr. Gwenlyn Found to review.

## 2014-03-11 ENCOUNTER — Encounter (HOSPITAL_COMMUNITY)
Admission: RE | Admit: 2014-03-11 | Discharge: 2014-03-11 | Disposition: A | Discharge status: Home / Self Care | Payer: BC Managed Care – PPO | Source: Ambulatory Visit | Attending: Cardiovascular Disease | Admitting: Cardiovascular Disease

## 2014-03-13 ENCOUNTER — Encounter (HOSPITAL_COMMUNITY)
Admission: RE | Admit: 2014-03-13 | Discharge: 2014-03-13 | Disposition: A | Discharge status: Home / Self Care | Payer: BC Managed Care – PPO | Source: Ambulatory Visit | Attending: Cardiovascular Disease | Admitting: Cardiovascular Disease

## 2014-03-16 ENCOUNTER — Encounter (HOSPITAL_COMMUNITY)
Admission: RE | Admit: 2014-03-16 | Discharge: 2014-03-16 | Disposition: A | Discharge status: Home / Self Care | Payer: BC Managed Care – PPO | Source: Ambulatory Visit | Attending: Cardiovascular Disease | Admitting: Cardiovascular Disease

## 2014-03-18 ENCOUNTER — Encounter (HOSPITAL_COMMUNITY)
Admission: RE | Admit: 2014-03-18 | Discharge: 2014-03-18 | Disposition: A | Discharge status: Home / Self Care | Payer: BC Managed Care – PPO | Source: Ambulatory Visit | Attending: Cardiovascular Disease | Admitting: Cardiovascular Disease

## 2014-03-20 ENCOUNTER — Encounter (HOSPITAL_COMMUNITY)
Admission: RE | Admit: 2014-03-20 | Discharge: 2014-03-20 | Disposition: A | Discharge status: Home / Self Care | Payer: BC Managed Care – PPO | Source: Ambulatory Visit | Attending: Cardiovascular Disease | Admitting: Cardiovascular Disease

## 2014-03-23 ENCOUNTER — Encounter (HOSPITAL_COMMUNITY)
Admission: RE | Admit: 2014-03-23 | Discharge: 2014-03-23 | Disposition: A | Discharge status: Home / Self Care | Payer: BC Managed Care – PPO | Source: Ambulatory Visit | Attending: Cardiovascular Disease | Admitting: Cardiovascular Disease

## 2014-03-25 ENCOUNTER — Encounter (HOSPITAL_COMMUNITY)
Admission: RE | Admit: 2014-03-25 | Discharge: 2014-03-25 | Disposition: A | Discharge status: Home / Self Care | Payer: BC Managed Care – PPO | Source: Ambulatory Visit | Attending: Cardiovascular Disease | Admitting: Cardiovascular Disease

## 2014-03-27 ENCOUNTER — Encounter (HOSPITAL_COMMUNITY): Payer: BC Managed Care – PPO

## 2014-03-30 ENCOUNTER — Encounter (HOSPITAL_COMMUNITY): Payer: BC Managed Care – PPO

## 2014-04-01 ENCOUNTER — Encounter (HOSPITAL_COMMUNITY)
Admission: RE | Admit: 2014-04-01 | Discharge: 2014-04-01 | Disposition: A | Discharge status: Home / Self Care | Payer: BC Managed Care – PPO | Source: Ambulatory Visit | Attending: Cardiovascular Disease | Admitting: Cardiovascular Disease

## 2014-04-03 ENCOUNTER — Encounter (HOSPITAL_COMMUNITY)
Admission: RE | Admit: 2014-04-03 | Discharge: 2014-04-03 | Disposition: A | Discharge status: Home / Self Care | Payer: BC Managed Care – PPO | Source: Ambulatory Visit | Attending: Cardiovascular Disease | Admitting: Cardiovascular Disease

## 2014-04-06 ENCOUNTER — Encounter (HOSPITAL_COMMUNITY): Payer: BC Managed Care – PPO

## 2014-04-08 ENCOUNTER — Encounter (HOSPITAL_COMMUNITY)
Admission: RE | Admit: 2014-04-08 | Discharge: 2014-04-08 | Disposition: A | Discharge status: Home / Self Care | Payer: BC Managed Care – PPO | Source: Ambulatory Visit | Attending: Cardiovascular Disease | Admitting: Cardiovascular Disease

## 2014-04-08 DIAGNOSIS — Z5189 Encounter for other specified aftercare: Secondary | ICD-10-CM | POA: Insufficient documentation

## 2014-04-08 DIAGNOSIS — I219 Acute myocardial infarction, unspecified: Secondary | ICD-10-CM | POA: Insufficient documentation

## 2014-04-08 DIAGNOSIS — Z9861 Coronary angioplasty status: Secondary | ICD-10-CM | POA: Insufficient documentation

## 2014-04-10 ENCOUNTER — Encounter (HOSPITAL_COMMUNITY)
Admission: RE | Admit: 2014-04-10 | Discharge: 2014-04-10 | Disposition: A | Discharge status: Home / Self Care | Payer: BC Managed Care – PPO | Source: Ambulatory Visit | Attending: Cardiovascular Disease | Admitting: Cardiovascular Disease

## 2014-04-13 ENCOUNTER — Encounter (HOSPITAL_COMMUNITY)
Admission: RE | Admit: 2014-04-13 | Discharge: 2014-04-13 | Disposition: A | Discharge status: Home / Self Care | Payer: BC Managed Care – PPO | Source: Ambulatory Visit | Attending: Cardiovascular Disease | Admitting: Cardiovascular Disease

## 2014-04-15 ENCOUNTER — Encounter (HOSPITAL_COMMUNITY)
Admission: RE | Admit: 2014-04-15 | Discharge: 2014-04-15 | Disposition: A | Discharge status: Home / Self Care | Payer: BC Managed Care – PPO | Source: Ambulatory Visit | Attending: Cardiovascular Disease | Admitting: Cardiovascular Disease

## 2014-04-17 ENCOUNTER — Encounter (HOSPITAL_COMMUNITY)
Admission: RE | Admit: 2014-04-17 | Discharge: 2014-04-17 | Disposition: A | Discharge status: Home / Self Care | Payer: BC Managed Care – PPO | Source: Ambulatory Visit | Attending: Cardiovascular Disease | Admitting: Cardiovascular Disease

## 2014-04-20 ENCOUNTER — Encounter (HOSPITAL_COMMUNITY)
Admission: RE | Admit: 2014-04-20 | Discharge: 2014-04-20 | Disposition: A | Discharge status: Home / Self Care | Payer: BC Managed Care – PPO | Source: Ambulatory Visit | Attending: Cardiovascular Disease | Admitting: Cardiovascular Disease

## 2014-04-22 ENCOUNTER — Encounter (HOSPITAL_COMMUNITY)
Admission: RE | Admit: 2014-04-22 | Discharge: 2014-04-22 | Disposition: A | Discharge status: Home / Self Care | Payer: BC Managed Care – PPO | Source: Ambulatory Visit | Attending: Cardiovascular Disease | Admitting: Cardiovascular Disease

## 2014-04-23 ENCOUNTER — Telehealth: Payer: Self-pay | Admitting: Cardiovascular Disease

## 2014-04-23 NOTE — Telephone Encounter (Signed)
RN spoke to [place]. Patient needs a right breast biopsy. Per Janett Billow , radiologist is able to do biopsy on Brilinta. It will be minimal invasive procedure.  Per Janett Billow, patient states Dr Gwenlyn Found informed her that she could not stop Brilinta for 12 months. Patient will like Dr Kennon Holter clearance. Will defer to  Dr Gwenlyn Found?  Jessica aware.

## 2014-04-24 ENCOUNTER — Encounter (HOSPITAL_COMMUNITY)
Admission: RE | Admit: 2014-04-24 | Discharge: 2014-04-24 | Disposition: A | Discharge status: Home / Self Care | Payer: BC Managed Care – PPO | Source: Ambulatory Visit | Attending: Cardiovascular Disease | Admitting: Cardiovascular Disease

## 2014-04-24 NOTE — Telephone Encounter (Signed)
Okay to do breast biopsy but cannot stop Dual antiplatelet herapy until March of 2016

## 2014-04-24 NOTE — Telephone Encounter (Signed)
Please advise 

## 2014-04-27 ENCOUNTER — Encounter (HOSPITAL_COMMUNITY)
Admission: RE | Admit: 2014-04-27 | Discharge: 2014-04-27 | Disposition: A | Discharge status: Home / Self Care | Payer: BC Managed Care – PPO | Source: Ambulatory Visit | Attending: Cardiovascular Disease | Admitting: Cardiovascular Disease

## 2014-04-27 NOTE — Telephone Encounter (Signed)
Mariah Kelley aware of Dr Kennon Holter recommendation.

## 2014-04-29 ENCOUNTER — Encounter (HOSPITAL_COMMUNITY): Payer: BC Managed Care – PPO

## 2014-05-01 ENCOUNTER — Encounter (HOSPITAL_COMMUNITY)
Admission: RE | Admit: 2014-05-01 | Discharge: 2014-05-01 | Disposition: A | Discharge status: Home / Self Care | Payer: BC Managed Care – PPO | Source: Ambulatory Visit | Attending: Cardiovascular Disease | Admitting: Cardiovascular Disease

## 2014-05-04 ENCOUNTER — Encounter (HOSPITAL_COMMUNITY)
Admission: RE | Admit: 2014-05-04 | Discharge: 2014-05-04 | Disposition: A | Discharge status: Home / Self Care | Payer: BC Managed Care – PPO | Source: Ambulatory Visit | Attending: Cardiovascular Disease | Admitting: Cardiovascular Disease

## 2014-05-05 ENCOUNTER — Encounter: Payer: Self-pay | Admitting: Cardiovascular Disease

## 2014-05-06 ENCOUNTER — Encounter (HOSPITAL_COMMUNITY)
Admission: RE | Admit: 2014-05-06 | Discharge: 2014-05-06 | Disposition: A | Discharge status: Home / Self Care | Payer: BC Managed Care – PPO | Source: Ambulatory Visit | Attending: Cardiovascular Disease | Admitting: Cardiovascular Disease

## 2014-05-06 DIAGNOSIS — Z9861 Coronary angioplasty status: Secondary | ICD-10-CM | POA: Insufficient documentation

## 2014-05-06 DIAGNOSIS — I219 Acute myocardial infarction, unspecified: Secondary | ICD-10-CM | POA: Insufficient documentation

## 2014-05-06 DIAGNOSIS — Z5189 Encounter for other specified aftercare: Secondary | ICD-10-CM | POA: Insufficient documentation

## 2014-05-11 ENCOUNTER — Encounter (HOSPITAL_COMMUNITY)
Admission: RE | Admit: 2014-05-11 | Discharge: 2014-05-11 | Disposition: A | Discharge status: Home / Self Care | Payer: BC Managed Care – PPO | Source: Ambulatory Visit | Attending: Cardiovascular Disease | Admitting: Cardiovascular Disease

## 2014-05-13 ENCOUNTER — Encounter (HOSPITAL_COMMUNITY)
Admission: RE | Admit: 2014-05-13 | Discharge: 2014-05-13 | Disposition: A | Discharge status: Home / Self Care | Payer: BC Managed Care – PPO | Source: Ambulatory Visit | Attending: Cardiovascular Disease | Admitting: Cardiovascular Disease

## 2014-05-15 ENCOUNTER — Encounter (HOSPITAL_COMMUNITY)
Admission: RE | Admit: 2014-05-15 | Discharge: 2014-05-15 | Disposition: A | Discharge status: Home / Self Care | Payer: BC Managed Care – PPO | Source: Ambulatory Visit | Attending: Cardiovascular Disease | Admitting: Cardiovascular Disease

## 2014-05-18 ENCOUNTER — Encounter (HOSPITAL_COMMUNITY)
Admission: RE | Admit: 2014-05-18 | Discharge: 2014-05-18 | Disposition: A | Discharge status: Home / Self Care | Payer: BC Managed Care – PPO | Source: Ambulatory Visit | Attending: Cardiovascular Disease | Admitting: Cardiovascular Disease

## 2014-05-20 ENCOUNTER — Encounter (HOSPITAL_COMMUNITY)
Admission: RE | Admit: 2014-05-20 | Discharge: 2014-05-20 | Disposition: A | Discharge status: Home / Self Care | Payer: BC Managed Care – PPO | Source: Ambulatory Visit | Attending: Cardiovascular Disease | Admitting: Cardiovascular Disease
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
## 2014-05-22 ENCOUNTER — Encounter (HOSPITAL_COMMUNITY): Admission: RE | Admit: 2014-05-22 | Payer: BC Managed Care – PPO | Source: Ambulatory Visit

## 2014-05-26 ENCOUNTER — Encounter: Payer: Self-pay | Admitting: Cardiovascular Disease

## 2014-06-19 ENCOUNTER — Other Ambulatory Visit: Payer: Self-pay | Admitting: Cardiology

## 2014-06-22 NOTE — Telephone Encounter (Signed)
Rx refill sent to patient pharmacy   

## 2014-10-15 ENCOUNTER — Encounter (HOSPITAL_COMMUNITY): Payer: Self-pay | Admitting: Cardiovascular Disease

## 2014-12-03 ENCOUNTER — Other Ambulatory Visit: Payer: Self-pay | Admitting: Cardiovascular Disease

## 2014-12-22 ENCOUNTER — Other Ambulatory Visit: Payer: Self-pay | Admitting: Radiology

## 2014-12-22 DIAGNOSIS — C50919 Malignant neoplasm of unspecified site of unspecified female breast: Secondary | ICD-10-CM

## 2014-12-23 ENCOUNTER — Telehealth: Payer: Self-pay | Admitting: *Deleted

## 2014-12-23 ENCOUNTER — Encounter: Payer: Self-pay | Admitting: *Deleted

## 2014-12-23 DIAGNOSIS — C50411 Malignant neoplasm of upper-outer quadrant of right female breast: Secondary | ICD-10-CM

## 2014-12-23 DIAGNOSIS — Z170 Estrogen receptor positive status [ER+]: Secondary | ICD-10-CM

## 2014-12-23 HISTORY — DX: Malignant neoplasm of upper-outer quadrant of right female breast: C50.411

## 2014-12-23 NOTE — Telephone Encounter (Signed)
Confirmed [facility] for 12/30/14 at 0800 .  Instructions and contact information given.

## 2014-12-29 ENCOUNTER — Ambulatory Visit
Admission: RE | Admit: 2014-12-29 | Discharge: 2014-12-29 | Disposition: A | Discharge status: Home / Self Care | Payer: Self-pay | Source: Ambulatory Visit | Attending: Radiology | Admitting: Radiology

## 2014-12-29 DIAGNOSIS — C50919 Malignant neoplasm of unspecified site of unspecified female breast: Secondary | ICD-10-CM

## 2014-12-29 MED ORDER — GADOBENATE DIMEGLUMINE 529 MG/ML IV SOLN
20.0000 mL | Freq: Once | INTRAVENOUS | Status: AC | PRN
  Administered 2014-12-29: 20 mL via INTRAVENOUS

## 2014-12-30 ENCOUNTER — Encounter: Payer: Self-pay | Admitting: *Deleted

## 2014-12-30 ENCOUNTER — Ambulatory Visit: Payer: BLUE CROSS/BLUE SHIELD | Attending: Surgery | Admitting: Physical Therapy

## 2014-12-30 ENCOUNTER — Encounter: Payer: Self-pay | Admitting: Hematology and Oncology

## 2014-12-30 ENCOUNTER — Ambulatory Visit (HOSPITAL_BASED_OUTPATIENT_CLINIC_OR_DEPARTMENT_OTHER): Payer: BLUE CROSS/BLUE SHIELD | Admitting: Hematology and Oncology

## 2014-12-30 ENCOUNTER — Other Ambulatory Visit (HOSPITAL_BASED_OUTPATIENT_CLINIC_OR_DEPARTMENT_OTHER): Payer: BLUE CROSS/BLUE SHIELD

## 2014-12-30 ENCOUNTER — Encounter (INDEPENDENT_AMBULATORY_CARE_PROVIDER_SITE_OTHER): Payer: Self-pay | Admitting: Surgery

## 2014-12-30 ENCOUNTER — Encounter: Payer: Self-pay | Admitting: Physical Therapy

## 2014-12-30 ENCOUNTER — Encounter: Payer: Self-pay | Admitting: Skilled Nursing Facility1

## 2014-12-30 ENCOUNTER — Ambulatory Visit: Payer: BLUE CROSS/BLUE SHIELD

## 2014-12-30 ENCOUNTER — Ambulatory Visit
Admission: RE | Admit: 2014-12-30 | Discharge: 2014-12-30 | Disposition: A | Discharge status: Home / Self Care | Payer: BLUE CROSS/BLUE SHIELD | Source: Ambulatory Visit | Attending: Radiation Oncology | Admitting: Radiation Oncology

## 2014-12-30 VITALS — BP 160/69 | HR 59 | Temp 97.8°F | Resp 19 | Ht 69.0 in | Wt 238.4 lb

## 2014-12-30 DIAGNOSIS — C50911 Malignant neoplasm of unspecified site of right female breast: Secondary | ICD-10-CM | POA: Diagnosis not present

## 2014-12-30 DIAGNOSIS — R293 Abnormal posture: Secondary | ICD-10-CM | POA: Insufficient documentation

## 2014-12-30 DIAGNOSIS — Z170 Estrogen receptor positive status [ER+]: Secondary | ICD-10-CM

## 2014-12-30 DIAGNOSIS — C50411 Malignant neoplasm of upper-outer quadrant of right female breast: Secondary | ICD-10-CM

## 2014-12-30 DIAGNOSIS — M7582 Other shoulder lesions, left shoulder: Secondary | ICD-10-CM | POA: Diagnosis not present

## 2014-12-30 DIAGNOSIS — M25612 Stiffness of left shoulder, not elsewhere classified: Secondary | ICD-10-CM

## 2014-12-30 LAB — CBC WITH DIFFERENTIAL/PLATELET
BASO%: 0.8 % (ref 0.0–2.0)
Basophils Absolute: 0.1 10*3/uL (ref 0.0–0.1)
EOS%: 3.9 % (ref 0.0–7.0)
Eosinophils Absolute: 0.3 10*3/uL (ref 0.0–0.5)
HEMATOCRIT: 37.8 % (ref 34.8–46.6)
HEMOGLOBIN: 12.3 g/dL (ref 11.6–15.9)
LYMPH#: 1.8 10*3/uL (ref 0.9–3.3)
LYMPH%: 22.5 % (ref 14.0–49.7)
MCH: 29.2 pg (ref 25.1–34.0)
MCHC: 32.4 g/dL (ref 31.5–36.0)
MCV: 90.1 fL (ref 79.5–101.0)
MONO#: 0.7 10*3/uL (ref 0.1–0.9)
MONO%: 8.9 % (ref 0.0–14.0)
NEUT#: 5 10*3/uL (ref 1.5–6.5)
NEUT%: 63.9 % (ref 38.4–76.8)
PLATELETS: 311 10*3/uL (ref 145–400)
RBC: 4.2 10*6/uL (ref 3.70–5.45)
RDW: 12.9 % (ref 11.2–14.5)
WBC: 7.8 10*3/uL (ref 3.9–10.3)

## 2014-12-30 LAB — COMPREHENSIVE METABOLIC PANEL (CC13)
ALBUMIN: 3.9 g/dL (ref 3.5–5.0)
ALT: 15 U/L (ref 0–55)
ANION GAP: 8 meq/L (ref 3–11)
AST: 16 U/L (ref 5–34)
Alkaline Phosphatase: 84 U/L (ref 40–150)
BUN: 24 mg/dL (ref 7.0–26.0)
CALCIUM: 9.7 mg/dL (ref 8.4–10.4)
CHLORIDE: 107 meq/L (ref 98–109)
CO2: 28 meq/L (ref 22–29)
CREATININE: 0.8 mg/dL (ref 0.6–1.1)
EGFR: 79 mL/min/{1.73_m2} — AB (ref >90)
GLUCOSE: 91 mg/dL (ref 70–140)
POTASSIUM: 4.6 meq/L (ref 3.5–5.1)
Sodium: 143 mEq/L (ref 136–145)
Total Bilirubin: 0.52 mg/dL (ref 0.20–1.20)
Total Protein: 6.8 g/dL (ref 6.4–8.3)

## 2014-12-30 NOTE — Progress Notes (Signed)
Ms. Mariah Kelley is a very pleasant 64 y.o. female from [place], [place] with newly diagnosed grade 2 invasive ductal carcinoma with foci of DCIS of the right breast.  Biopsy results revealed the tumor's hormone status as ER positive, PR positive, and HER2/neu negative. Ki67 is 21%.  She presents today with her friend to the [facility]) for treatment consideration and recommendations from the breast surgeon, radiation oncologist, and medical oncologist.     I briefly met with Ms. Mariah Kelley and her friend during her [facility] visit today. We discussed the purpose of the Survivorship Clinic, which will include monitoring for recurrence, coordinating completion of age and gender-appropriate cancer screenings, promotion of overall wellness, as well as managing potential late/long-term side effects of anti-cancer treatments.    As of today, the treatment plan for Ms. Mariah Kelley will likely include surgery and possibly radiation therapy.  Anti-estrogen therapy will be considered for her as well. The intent of treatment for Ms. Mariah Kelley is cure, therefore she will be eligible for the Survivorship Clinic upon her completion of treatment.  Her survivorship care plan (SCP) document will be drafted and updated throughout the course of her treatment trajectory. She will receive the SCP in an office visit with myself in the Survivorship Clinic once she has completed treatment.   Ms. Mariah Kelley was encouraged to ask questions and all questions were answered to her satisfaction.  She was given my business card and encouraged to contact me with any concerns regarding survivorship.  I look forward to participating in her care.   Mike Craze, NP [place] [phone]

## 2014-12-30 NOTE — Progress Notes (Signed)
I met with Ms. Mariah Kelley and her partner today.  There was a recording device in the room We discussed that she would likely not need radiation as we believed the areas seen on her MRI and mammogram were DCIS.  We discussed indications for post mastectomy radiation include positive lymph nodes and invasive tumor greater than 5 cm. We discussed that we would present her pathology after surgery at our morning conference and that if she needed radiation, things would have changed and she would be set up for a formal consultation with me.

## 2014-12-30 NOTE — Progress Notes (Signed)
Subjective:     Patient ID: Mariah Kelley, female   DOB: Oct 01, 1951, 64 y.o.   MRN: [id number]  HPI   Review of Systems     Objective:   Physical Exam For the patient to understand and be given the tools to implement a healthy plant based diet during their cancer diagnosis.    Assessment:     Patient was seen today and found to be pleasant and accompanied by her partner. Patients right breast is afflicted with calcifications and a lump in the right breast. Patients current/relevant medications are: atorvastatin, magnesium, and metoprolol. She has had gallbladder surgery and a MI in 01/17/14. Patient states she exersises at the [facility] five days a week. Patient states she has changed her diet since her heart attack by adding more healthy foods. Patient questioned whether or not increased protein would help her to heal after surgery. Patients labs are WNL except GFR which is at 79. Patient does have diabetes. Patients current weight is 238 pounds with 35.3 BMI at a height of 5'9'' on 02/17/2014 she was 238 pounds 39.7 BMI; she has steadily been decreasing her weight since one year ago.        Plan:     Dietitian educated the patient on implementing a plant based diet by incorporating more plant proteins, fruits, and vegetables. As a part of a healthy routine physical activity was discussed. The importance of legitimate, evidence based information was discussed and examples were given. A folder of evidence based information with a focus on a plant based diet and general nutrition during cancer was given to the patient.  Dietitian educated patient on proteins role in the body and the recommendations for intake after surgery.  As a part of the continuum of care the cancer dietitian's contact information was given to the patient in the event they would like to have a follow up appointment.

## 2014-12-30 NOTE — Patient Instructions (Signed)

## 2014-12-30 NOTE — Assessment & Plan Note (Signed)
Right breast grade 2 invasive ductal carcinoma 2.1 cm, 2.2 cm area of calcifications ADH versus DCIS, 2 possible satellite nodules 6 mm and 4 mm, total span of 5.1 cm T2 N0 M0's clinical stage II a ER/PR positive HER-2 negative KI 67 of 21%.  Pathology and radiology review:Discussed with the patient, the details of pathology including the type of breast cancer,the clinical staging, the significance of ER, PR and HER-2/neu receptors and the implications for treatment. After reviewing the pathology in detail, we proceeded to discuss the different treatment options between surgery, radiation, chemotherapy, antiestrogen therapies.  Recommendation based multidisciplinary tumor board: 1. Surgery most likely mastectomy followed by 2. Oncotype DX testing to determine whether she would benefit from chemotherapy followed by 3. Antiestrogen therapy with aromatase and others for 5 years  Oncotype DX counseling:I discussed Oncotype DX test. I explained to the patient that this is a 21 gene panel to evaluate patient tumors DNA to calculate recurrence score. This would help determine whether patient has high risk or intermediate risk or low risk breast cancer. She understands that if her tumor was found to be high risk, she would benefit from systemic chemotherapy. If low risk, no need of chemotherapy. If she was found to be intermediate risk, we would need to evaluate the score as well as other risk factors and determine if an abbreviated chemotherapy may be of benefit.  Return to clinic after surgery to discuss further treatment plan.

## 2014-12-30 NOTE — Progress Notes (Signed)
Clinical Social Work [place] Psychosocial Distress Screening   Patient completed distress screening protocol and scored a 3 on the Psychosocial Distress Thermometer which indicates mild distress. Clinical Social Worker met with patient and patient's partner in [facility] to assess for distress and other psychosocial needs.  Patient stated she was doing well and felt comfortable with her treatment team.  CSW and patient discussed common feelings and emotions associated with being diagnosed with cancer.  CSW informed patient of the support team and support services at [facility], and patient was agreeable to an [facility] guide referral.  CSW provided contact information and encouraged her to call with any questions or concerns.       ONCBCN DISTRESS SCREENING 12/30/2014  Screening Type Initial Screening  Distress experienced in past week (1-10) 3  Emotional problem type Nervousness/Anxiety  Information Concerns Type Lack of info about treatment;Lack of info about maintaining fitness  Physical Problem type Skin dry/itchy  Physician notified of physical symptoms Yes  Referral to clinical psychology No  Referral to clinical social work No  Referral to dietition No  Referral to financial advocate No  Referral to support programs Yes  Referral to palliative care No   Johnnye Lana, MSW, LCSW, OSW-C Clinical Social Worker [place] [phone]

## 2014-12-30 NOTE — Progress Notes (Signed)
Note created by Dr. Gudena during office visit. Copy to patient, original to scan. 

## 2014-12-30 NOTE — Progress Notes (Signed)
[facility] NOTE  Patient Care Team: [name], MD as PCP - General (Family Medicine) Alphonsa Overall, MD as Consulting Physician (General Surgery) Rulon Eisenmenger, MD as Consulting Physician (Hematology and Oncology) Thea Silversmith, MD as Consulting Physician (Radiation Oncology) [facility] RN as Registered Nurse Roselee Culver, RN as Registered Nurse  CHIEF COMPLAINTS/PURPOSE OF CONSULTATION:  Newly diagnosed breast cancer  HISTORY OF PRESENTING ILLNESS:  Mariah Kelley 64 y.o. female is here because of recent diagnosis of right breast invasive ductal carcinoma grade 2 ER/PR positive HER-2 negative with a Ki-67 of 21%. Patient has some 2 separate masses along with 2 additional satellite nodules. They biopsy proven breast cancer was 2.1 cm, 2.2 cm area of calcification was biopsy-proven to be DCIS and tools possible satellite nodules measuring 6 mm and 4 mm were also noted total span measuring 5.1 cm. The right axillary lymph node was biopsy-proven to be negative. Patient had a history of acute myocardial infarction one year ago and underwent stent placement is currently in [place]. Patient had a palpable mass which led to investigation with the mammogram and ultrasound that revealed these abnormalities. Denies any pain or discomfort.  I reviewed her records extensively and collaborated the history with the patient.  SUMMARY OF ONCOLOGIC HISTORY:   Breast cancer of upper-outer quadrant of right female breast   12/17/2014 Initial Biopsy Right breast biopsy: Invasive ductal carcinoma with DCIS, right axillary lymph node benign   12/24/2014 Initial Biopsy Right breast core biopsy: At least atypical ductal hyperplasia with associated calcification but findings suspicious for DCIS   12/29/2014 Breast MRI 2.1 cm mass in the upper-outer quadrant of the right breast 2 possible satellite nodules associated with this mass. NME 2.2cm, together 5.1 cm     In terms of breast cancer risk profile:  She menarched at early age of 89 and went to menopause in 1976  She had 3 pregnancy, her first child was born at age 76  She has not received birth control pills.  She was never exposed to fertility medications or hormone replacement therapy.  She has no family history of Breast/GYN/GI cancer  MEDICAL HISTORY:  Past Medical History  Diagnosis Date  . Hypertension   . History of tobacco abuse     40 pack-years  . Obesity   . Hyperlipidemia   . ST elevation myocardial infarction (STEMI) of inferolateral wall 01/17/2014  . CAD S/P percutaneous coronary angioplasty 01/17/2014    - inferior STEMI: Mid RCA PCI Promus DES 3.0 mm x 12 mm (3.5 mm)  . Metabolic syndrome   . Breast cancer of upper-outer quadrant of right female breast 12/23/2014  . Arthritis     SURGICAL HISTORY: Past Surgical History  Procedure Laterality Date  . Abdominal hysterectomy    . Tubule ligation    . Spinal fusion    . Appendectomy    . Percutaneous coronary stent intervention (pci-s)    . Left heart cath Bilateral 01/17/2014    Procedure: LEFT HEART CATH;  Surgeon: Lorretta Harp, MD;  Location: [facility] CATH LAB;  Service: Cardiovascular;  Laterality: Bilateral;  . Cholecystectomy      SOCIAL HISTORY: History   Social History  . Marital Status: Single    Spouse Name: N/A  . Number of Children: N/A  . Years of Education: N/A   Occupational History  . Social Worker    Social History Main Topics  . Smoking status: Former Smoker -- 1.00 packs/day  for 40 years    Types: Cigarettes    Quit date: 07/30/2009  . Smokeless tobacco: Former Systems developer    Quit date: 01/17/2013  . Alcohol Use: Yes     Comment: 3-4 times a year  . Drug Use: No  . Sexual Activity: Not on file   Other Topics Concern  . Not on file   Social History Narrative   Divorced, 2 children.     FAMILY HISTORY: Family History  Problem Relation Age of Onset  . Heart attack Mother 67     passed at 43  . Heart attack Father 22    s/p CABG, multiple PCIs  . CAD Brother     Still living  . CAD Brother     Still living    ALLERGIES:  is allergic to codeine and prednisone.  MEDICATIONS:  Current Outpatient Prescriptions  Medication Sig Dispense Refill  . aspirin 81 MG chewable tablet Chew 81 mg by mouth daily.    [name] atorvastatin (LIPITOR) 80 MG tablet TAKE ONE TABLET BY MOUTH ONE TIME DAILY AT 6 P.M.  30 tablet 0  . Magnesium 250 MG TABS Take 250 mg by mouth daily.    . metoprolol (LOPRESSOR) 50 MG tablet TAKE ONE TABLET BY MOUTH TWICE DAILY  60 tablet 0  . Ticagrelor (BRILINTA) 90 MG TABS tablet Take 1 tablet (90 mg total) by mouth 2 (two) times daily. 60 tablet 11   No current facility-administered medications for this visit.    REVIEW OF SYSTEMS:   Constitutional: Denies fevers, chills or abnormal night sweats Eyes: Denies blurriness of vision, double vision or watery eyes Ears, nose, mouth, throat, and face: Denies mucositis or sore throat Respiratory: Denies cough, dyspnea or wheezes Cardiovascular: Denies palpitation, chest discomfort or lower extremity swelling Gastrointestinal:  Denies nausea, heartburn or change in bowel habits Skin: Denies abnormal skin rashes Lymphatics: Denies new lymphadenopathy or easy bruising Neurological:Denies numbness, tingling or new weaknesses Behavioral/Psych: Mood is stable, no new changes  Breast: Palpable mass in the right breast All other systems were reviewed with the patient and are negative.  PHYSICAL EXAMINATION: ECOG PERFORMANCE STATUS: 1 - Symptomatic but completely ambulatory  Filed Vitals:   12/30/14 0820  BP: 160/69  Pulse: 59  Temp: 97.8 F (36.6 C)  Resp: 19   Filed Weights   12/30/14 0820  Weight: 238 lb 6.4 oz (108.138 kg)    GENERAL:alert, no distress and comfortable SKIN: skin color, texture, turgor are normal, no rashes or significant lesions EYES: normal, conjunctiva are pink and  non-injected, sclera clear OROPHARYNX:no exudate, no erythema and lips, buccal mucosa, and tongue normal  NECK: supple, thyroid normal size, non-tender, without nodularity LYMPH:  no palpable lymphadenopathy in the cervical, axillary or inguinal LUNGS: clear to auscultation and percussion with normal breathing effort HEART: regular rate & rhythm and no murmurs and no lower extremity edema ABDOMEN:abdomen soft, non-tender and normal bowel sounds Musculoskeletal:no cyanosis of digits and no clubbing  PSYCH: alert & oriented x 3 with fluent speech NEURO: no focal motor/sensory deficits BREAST: Large palpable mass in the right breast with biopsy changes to the skin. No palpable axillary or supraclavicular lymphadenopathy (exam performed in the presence of a chaperone)   LABORATORY DATA:  I have reviewed the data as listed Lab Results  Component Value Date   WBC 7.8 12/30/2014   HGB 12.3 12/30/2014   HCT 37.8 12/30/2014   MCV 90.1 12/30/2014   PLT 311 12/30/2014   Lab Results  Component Value Date   NA 143 12/30/2014   K 4.6 12/30/2014   CL 100 01/20/2014   CO2 28 12/30/2014    RADIOGRAPHIC STUDIES: I have personally reviewed the radiological reports and agreed with the findings in the report. Results are summarized as above  ASSESSMENT AND PLAN:  Breast cancer of upper-outer quadrant of right female breast Right breast grade 2 invasive ductal carcinoma 2.1 cm, 2.2 cm area of calcifications ADH versus DCIS, 2 possible satellite nodules 6 mm and 4 mm, total span of 5.1 cm T2 N0 M0's clinical stage II a ER/PR positive HER-2 negative KI 67 of 21%.  Pathology and radiology review:Discussed with the patient, the details of pathology including the type of breast cancer,the clinical staging, the significance of ER, PR and HER-2/neu receptors and the implications for treatment. After reviewing the pathology in detail, we proceeded to discuss the different treatment options between surgery,  radiation, chemotherapy, antiestrogen therapies.  Recommendation based multidisciplinary tumor board: 1. Surgery most likely mastectomy followed by 2. Oncotype DX testing to determine whether she would benefit from chemotherapy followed by 3. Antiestrogen therapy with aromatase and others for 5 years  Oncotype DX counseling:I discussed Oncotype DX test. I explained to the patient that this is a 21 gene panel to evaluate patient tumors DNA to calculate recurrence score. This would help determine whether patient has high risk or intermediate risk or low risk breast cancer. She understands that if her tumor was found to be high risk, she would benefit from systemic chemotherapy. If low risk, no need of chemotherapy. If she was found to be intermediate risk, we would need to evaluate the score as well as other risk factors and determine if an abbreviated chemotherapy may be of benefit.  Return to clinic after surgery to discuss further treatment plan.  All questions were answered. The patient knows to call the clinic with any problems, questions or concerns.    Rulon Eisenmenger, MD 11:51 AM

## 2014-12-30 NOTE — Consult Note (Signed)
Re:   Mariah Kelley DOB:   1951/08/19 MRN:   [id number]   Breast [facility]DC consultationi  ASSESSMENT AND PLAN: 1.  Right breast cancer, UOQ  Oncology - [facility]  Her cancer covers a potential 5.1 cm - she wants to go ahead with a mastectomy  I discussed the options for breast cancer treatment with the patient.  I discussed a multidisciplinary approach to the treatment of breast cancer, which includes medical oncology and radiation oncology.  I discussed the surgical options of lumpectomy vs. mastectomy.  If mastectomy, there is the possibility of reconstruction.   I discussed the options of lymph node biopsy.  The treatment plan depends on the pathologic staging of the tumor and the patient's personal wishes.  The risks of surgery include, but are not limited to, bleeding, infection, the need for further surgery, and nerve injury.  The patient has been given literature on the treatment of breast cancer.  Plan: 1)  Card clearance, 2.) see plastic surgery (she wants to consider left breast reduction, but she is not sure that she wants anything done to the right side), 3) right mastectomy with SLNBx, 3) Probable Oncotype  2.  STEMI - 01/17/2014  Has a stent  Followed by Dr. Quay Burow - will need clearance  3. Obese 4.  HTN 5.  Quit smoking about 5 years ago 6.  On Brilinta  No chief complaint on file.  REFERRING PHYSICIAN:  Dr. Christene Slates  HISTORY OF PRESENT ILLNESS: Mariah Kelley is a 64 y.o. (DOB: Aug 29, 1951)  white  female whose primary care physician is [name], MD and comes to the [facility] [place] today for newly diagnosed right breast cancer. She is here with [name], a friend and she lives with her.  She had a biopsy in June 2015 which was benign.  She was supposed to follow up in 6 months, but time got by. [facility] when she felt a right breast lump. She has no fam history of breast cancer.  She is not on hormone medication.  Mammogram  at [facility] on 12/11/2014 shows a 2.0 cm round mass.  On 12/17/2014 she ahd a biopsy of her right breast - [id number] - Invasive ductal ca, ER/PR positive, Her2Neu - neg, Ki67 - 20%, and benign lymph node. On 12/24/2014 she had a biopsy of the right breast - [id number] - at least ADH (maybe DCIS)  MRI of breast 12/29/2014 - 1. 2.1 cm mass in the upper-outer quadrant of the right breast consistent with invasive ductal carcinoma and ductal carcinoma in situ a shown recent core biopsy. There 2 possible satellite nodules associated with this mass. See above.  2. Non mass enhancement medial and superior to this mass, also recently biopsied measuring approximately 2.2 cm.  3. Measured together the entire region of enhancement is 5.1 cm.   Past Medical History  Diagnosis Date  . Hypertension   . History of tobacco abuse     40 pack-years  . Obesity   . Hyperlipidemia   . ST elevation myocardial infarction (STEMI) of inferolateral wall 01/17/2014  . CAD S/P percutaneous coronary angioplasty 01/17/2014    - inferior STEMI: Mid RCA PCI Promus DES 3.0 mm x 12 mm (3.5 mm)  . Metabolic syndrome   . Breast cancer of upper-outer quadrant of right female breast 12/23/2014  . Arthritis       Past Surgical History  Procedure Laterality Date  . Abdominal hysterectomy    .  Tubule ligation    . Spinal fusion    . Appendectomy    . Percutaneous coronary stent intervention (pci-s)    . Left heart cath Bilateral 01/17/2014    Procedure: LEFT HEART CATH;  Surgeon: Lorretta Harp, MD;  [facility] CATH LAB;  Service: Cardiovascular;  Laterality: Bilateral;  . Cholecystectomy        Current Outpatient Prescriptions  Medication Sig Dispense Refill  . aspirin 81 MG chewable tablet Chew 81 mg by mouth daily.    [name] atorvastatin (LIPITOR) 80 MG tablet TAKE ONE TABLET BY MOUTH ONE TIME DAILY AT 6 P.M.  30 tablet 0  . Magnesium 250 MG TABS Take 250 mg by mouth daily.    . metoprolol (LOPRESSOR) 50 MG tablet TAKE ONE  TABLET BY MOUTH TWICE DAILY  60 tablet 0  . Ticagrelor (BRILINTA) 90 MG TABS tablet Take 1 tablet (90 mg total) by mouth 2 (two) times daily. 60 tablet 11   No current facility-administered medications for this visit.      Allergies  Allergen Reactions  . Codeine     Nausea and headache  . Prednisone Other (See Comments)    "made poison ivy worse", rebound effect when take prednisone    REVIEW OF SYSTEMS: Skin:  No history of rash.  No history of abnormal moles. Infection:  No history of hepatitis or HIV.  No history of MRSA. Neurologic:  No history of stroke.  No history of seizure.  No history of headaches. Cardiac: STEMI - 01/17/2014.   Has a stent.   Followed by Dr. Quay Burow Pulmonary:  Smoked until 2011.  Endocrine:  No diabetes. No thyroid disease. Gastrointestinal:  No history of stomach disease.  No history of liver disease.  No history of gall bladder disease.  No history of pancreas disease.  No history of colon disease. Urologic:  No history of kidney stones.  No history of bladder infections. Musculoskeletal:  Arthritis Hematologic:  On Brilinta for her stent. Psycho-social:  The patient is oriented.   The patient has no obvious psychologic or social impairment to understanding our conversation and plan.  SOCIAL and FAMILY HISTORY: Divorced She is here with [name], a friend and she lives with her. 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 Has 3 children - son 63, son 58, and daughter 38  PHYSICAL EXAM: There were no vitals taken for this visit.  General: Obese WF who is alert and generally healthy appearing.  HEENT: Normal. Pupils equal. Neck: Supple. No mass.  No thyroid mass. Lymph Nodes:  No supraclavicular, cervical, or axillary nodes. Breast:  Right  Bruise at 11 o'clock.  There is a mass effect there.  Left:  normal  Lungs: Clear to auscultation and symmetric breath sounds. Heart:   RRR. No murmur or rub. Abdomen: Soft. No mass. No tenderness. No hernia. Normal bowel sounds. Rectal: Not done. Extremities:  Good strength and ROM  in upper and lower extremities. Neurologic:  Grossly intact to motor and sensory function. Psychiatric: Has normal mood and affect. Behavior is normal.   DATA REVIEWED: Epic notes.  Alphonsa Overall, MD,  [facility], [address]  [place], [place]    [place] Phone:  [phone] FAX:  [phone]

## 2014-12-30 NOTE — Progress Notes (Signed)
Checked in new pt with no financial concerns prior to seeing the dr.  Informed pt if chemo is part of her treatment I will call her ins to see if Josem Kaufmann is req and will obtain it if it is as well as contact foundations that offer copay assistance for chemo if needed.  She has my card for any billing questions or concerns.

## 2014-12-30 NOTE — Therapy (Signed)
[place], [place], [address] Phone: [phone]   Fax:  [phone]  Physical Therapy Evaluation  Patient Details  Name: Mariah Kelley MRN: [id number] Date of Birth: [birth year] Referring Provider:  Alphonsa Overall, MD  Encounter Date: 12/30/2014      PT End of Session - 12/30/14 1114    Visit Number 1   Number of Visits 1   PT Start Time 0948   PT Stop Time 1016   PT Time Calculation (min) 28 min   Activity Tolerance Patient tolerated treatment well   Behavior During Therapy [facility] for tasks assessed/performed      Past Medical History  Diagnosis Date  . Hypertension   . History of tobacco abuse     40 pack-years  . Obesity   . Hyperlipidemia   . ST elevation myocardial infarction (STEMI) of inferolateral wall 01/17/2014  . CAD S/P percutaneous coronary angioplasty 01/17/2014    - inferior STEMI: Mid RCA PCI Promus DES 3.0 mm x 12 mm (3.5 mm)  . Metabolic syndrome   . Breast cancer of upper-outer quadrant of right female breast 12/23/2014  . Arthritis     Past Surgical History  Procedure Laterality Date  . Abdominal hysterectomy    . Tubule ligation    . Spinal fusion    . Appendectomy    . Percutaneous coronary stent intervention (pci-s)    . Left heart cath Bilateral 01/17/2014    Procedure: LEFT HEART CATH;  Surgeon: Lorretta Harp, MD;  Location: [facility] CATH LAB;  Service: Cardiovascular;  Laterality: Bilateral;  . Cholecystectomy      There were no vitals taken for this visit.  Visit Diagnosis:  Decreased range of motion of left shoulder - Plan: PT plan of care cert/re-cert  Abnormal posture - Plan: PT plan of care cert/re-cert  Breast cancer, right - Plan: PT plan of care cert/re-cert      Subjective Assessment - 12/30/14 1102    Symptoms Patient is being seen today for an assessment of her newly diagnosed breast cancer.   Pertinent History Patient diagnosed on 12/11/14 with  right ER/PR positive, HER2 negative breast cancer with a Ki67 of 20%.   Patient Stated Goals Reduce lymphedema risk and learn post op shoulder ROM exercises   Currently in Pain? Yes   Pain Score 1    Pain Location --  thumbs, knees and back due to osteoarthritis   Pain Orientation Right;Left   Pain Descriptors / Indicators Aching   Pain Type Chronic pain   Pain Onset More than a month ago   Pain Frequency Intermittent   Aggravating Factors  cold weather   Pain Relieving Factors moving around; heat          [facility] PT Assessment - 12/30/14 0001    Assessment   Medical Diagnosis right breast cancer   Onset Date 12/11/14   Precautions   Precautions Other (comment)  Active breast cancer   Restrictions   Weight Bearing Restrictions No   Balance Screen   Has the patient fallen in the past 6 months Yes   How many times? several  She reports these falls are due to hiking incidents   Has the patient had a decrease in activity level because of a fear of falling?  No   Is the patient reluctant to leave their home because of a fear of falling?  No   Home Environment   Living Enviornment Private residence  Living Arrangements Spouse/significant other   Available Help at Discharge --  partner   Type of Home House   Prior Function   Level of Independence Independent with basic ADLs   Vocation Full time employment  Social worker   Vocation Requirements sitting and using computer   Leisure Goes to [employer] 5x/wk, swims, cardio, weights   Cognition   Overall Cognitive Status Within Functional Limits for tasks assessed   Posture/Postural Control   Posture/Postural Control Postural limitations   Postural Limitations Rounded Shoulders;Forward head   ROM / Strength   AROM / PROM / Strength AROM;Strength   AROM   AROM Assessment Site Shoulder   Right/Left Shoulder Right;Left   Right Shoulder Extension 58 Degrees   Right Shoulder Flexion 144 Degrees   Right Shoulder ABduction 154 Degrees    Right Shoulder Internal Rotation 68 Degrees   Right Shoulder External Rotation 84 Degrees   Left Shoulder Extension 60 Degrees  shoulder pain reported with this movement   Left Shoulder Flexion 118 Degrees   Left Shoulder ABduction 125 Degrees  shoulder pain reported with this movement   Left Shoulder Internal Rotation 65 Degrees   Left Shoulder External Rotation 71 Degrees   Strength   Overall Strength Within functional limits for tasks performed           LYMPHEDEMA/ONCOLOGY QUESTIONNAIRE - 12/30/14 1112    Type   Cancer Type right breast   Lymphedema Assessments   Lymphedema Assessments Upper extremities   Right Upper Extremity Lymphedema   10 cm Proximal to Olecranon Process 36.2 cm   Olecranon Process 28.5 cm   10 cm Proximal to Ulnar Styloid Process 25.8 cm   Just Proximal to Ulnar Styloid Process 18.2 cm   Across Hand at [employer] 19.7 cm   At [place] of 2nd Digit 7 cm   Left Upper Extremity Lymphedema   10 cm Proximal to Olecranon Process 38.5 cm   Olecranon Process 29.2 cm   10 cm Proximal to Ulnar Styloid Process 25.1 cm   Just Proximal to Ulnar Styloid Process 18.8 cm   Across Hand at [employer] 20.4 cm   At [place] of 2nd Digit 7.1 cm           PT Education - 12/30/14 1113    Education provided Yes   Education Details Post op HEP and lymphedema risk reduction   Person(s) Educated Patient;Caregiver(s)   Methods Explanation;Demonstration;Handout   Comprehension Verbalized understanding;Returned demonstration              Breast Clinic Goals - 12/30/14 1117    Patient will be able to verbalize understanding of pertinent lymphedema risk reduction practices relevant to her diagnosis specifically related to skin care.   Time 1   Period Days   Status Achieved   Patient will be able to return demonstrate and/or verbalize understanding of the post-op home exercise program related to regaining shoulder range of motion.   Time 1   Period Days    Status Achieved   Patient will be able to verbalize understanding of the importance of attending the postoperative After Breast Cancer Class for further lymphedema risk reduction education and therapeutic exercise.   Time 1   Period Days   Status Achieved              Plan - 12/30/14 1114    Clinical Impression Statement Patient was seen today for a baseline assessment of her newly diagnosed right breast cancer.  She  is planning to have a right mastectomy and sentinel node biopsy followed by Oncotype testing and radiation if all her cancer is found to be invasive.   Pt will benefit from skilled therapeutic intervention in order to improve on the following deficits Decreased range of motion;Pain;Impaired UE functional use;Decreased mobility;Decreased strength;Decreased knowledge of precautions   Rehab Potential Good   Clinical Impairments Affecting Rehab Potential none   PT Frequency One time visit   PT Treatment/Interventions Patient/family education;Therapeutic exercise   Consulted and Agree with Plan of Care Patient;Family member/caregiver   Family Member Consulted Lori (Partner)     Patient will follow up at outpatient cancer rehab if needed following surgery.  If the patient requires physical therapy at that time, a specific plan will be dictated and sent to the referring physician for approval. The patient was educated today on appropriate basic range of motion exercises to begin post operatively and the importance of attending the After Breast Cancer class following surgery.  Patient was educated today on lymphedema risk reduction practices as it pertains to recommendations that will benefit the patient immediately following surgery.  She verbalized good understanding.  No additional physical therapy is indicated at this time.       Problem List Patient Active Problem List   Diagnosis Date Noted  . Breast cancer of upper-outer quadrant of right female breast 12/23/2014  .  Metabolic syndrome   . Diabetes 01/18/2014  . Dyslipidemia 01/18/2014  . HTN (hypertension) 01/18/2014  . ST elevation myocardial infarction (STEMI) of inferolateral wall 01/17/2014  . CAD S/P percutaneous coronary angioplasty - inferior STEMI: Mid RCA PCI Promus DES 3.0 mm x 12 mm (3.5 mm) 01/17/2014    SMITH,MARTI COOPER, PT 12/30/2014, 11:25 AM  [place], [place], [address] Phone: [phone]   Fax:  [phone]

## 2015-01-01 ENCOUNTER — Telehealth: Payer: Self-pay | Admitting: *Deleted

## 2015-01-01 NOTE — Telephone Encounter (Signed)
Dr. Lucia Gaskins is requesting the patient be seen at first available appt. He is requesting this appointment because patient needs cardiac clearance and permission to hold Brilinta for needed Breast Surgery (under general anesthesia) in the near future. Pt was due for a 6 month appt with an extender in Oct 2015, but had no appt. Pt is due for her 12 month f/u with you in April. Would you like to bring the patient in?

## 2015-01-04 ENCOUNTER — Telehealth: Payer: Self-pay | Admitting: Cardiovascular Disease

## 2015-01-04 ENCOUNTER — Telehealth: Payer: Self-pay | Admitting: *Deleted

## 2015-01-04 NOTE — Telephone Encounter (Signed)
Spoke with patient from [facility] 12/30/14.  She is doing well.  She is waiting on an appointment with a cardiologist for cardiac clearance.  No questions or concerns at this time.  Encouraged her to call with any needs or concerns.

## 2015-01-04 NOTE — Telephone Encounter (Signed)
Request for surgical clearance:  1. What type of surgery is being performed? Recently diagnosed with breast cancer  2. When is this surgery scheduled? Not yet scheduled  3. Are there any medications that need to be held prior to surgery and how long?no specified  4. Name of physician performing surgery? Dr. Lucia Gaskins- [place]  5. What is your office phone and fax number? Refer to [place] she has [place] form 6.

## 2015-01-05 NOTE — Telephone Encounter (Signed)
Pt is calling back in to see if anything has been arranged for to come in for surgical clearance. Please f/u  Thanks

## 2015-01-05 NOTE — Telephone Encounter (Signed)
Received a call from Amy with Dr.Newman's office she stated she was calling to follow up on a surgical clearance she faxed for Dr.Berry.Advised I scheduled patient appointment with Dr.Berry 01/08/15.Stated Dr.Newman needs to know how long to hold Brilinta before surgery.Advised to fax form back to her at fax # [phone].Messge sent to Dr.Berry's nurse [place].

## 2015-01-05 NOTE — Telephone Encounter (Signed)
Returned call to patient she stated she has been diagnosed with breast cancer and needs appointment with Dr.Berry for surgical clearance.Appointment scheduled with Dr.Berry 01/08/15 at 4:15 pm.

## 2015-01-08 ENCOUNTER — Encounter: Payer: Self-pay | Admitting: Cardiovascular Disease

## 2015-01-08 ENCOUNTER — Ambulatory Visit (INDEPENDENT_AMBULATORY_CARE_PROVIDER_SITE_OTHER): Payer: BLUE CROSS/BLUE SHIELD | Admitting: Cardiovascular Disease

## 2015-01-08 VITALS — BP 130/72 | HR 85 | Ht 69.0 in | Wt 230.6 lb

## 2015-01-08 DIAGNOSIS — E785 Hyperlipidemia, unspecified: Secondary | ICD-10-CM

## 2015-01-08 DIAGNOSIS — I2510 Atherosclerotic heart disease of native coronary artery without angina pectoris: Secondary | ICD-10-CM

## 2015-01-08 DIAGNOSIS — I10 Essential (primary) hypertension: Secondary | ICD-10-CM

## 2015-01-08 DIAGNOSIS — Z9861 Coronary angioplasty status: Secondary | ICD-10-CM

## 2015-01-08 MED ORDER — CLOPIDOGREL BISULFATE 75 MG PO TABS: 75.0000 mg | ORAL_TABLET | Freq: Every day | ORAL | Status: DC

## 2015-01-08 NOTE — Patient Instructions (Signed)
You have been cleared at Low risk for your procedure. A letter will be faxed to Dr. Pollie Friar office, and a copy will be given to you.  Please stop your Brilinta 1 week prior to your procedure. Following the procedure please START:  Plavix 75 mg, a written prescription is being given to you.  Your physician wants you to follow-up in 1 year with Dr. Gwenlyn Found. You will receive a reminder letter in the mail 2 months in advance. If you do not receive a letter, please call our office to schedule the follow-up appointment.

## 2015-01-08 NOTE — Progress Notes (Signed)
01/08/2015 Mariah Kelley   Apr 27, 1951  [id number]  Primary Physician [name], MD Primary Cardiologist: [name] [place], [place]   HPI:  : Mariah Kelley is a 64y.o. female w/ PMHx s/f HTN, HLD, prior tobacco abuse, obesity and family h/o premature CAD who is admitted to [facility] today for STEMI.  She was in her USOH until ~2AM this morning when she experienced sudden onset substernal chest pressure radiating to her neck and arms bilaterally w/ associated nausea and vomiting. The discomfort persisted through the morning. She took an ASA 81mg  ~2536 w/ minimal relief. When the discomfort intensified to a 7-8/10, she sought medical care.  She has no prior cardiac history. She reports smoking 1 PPD x 40 years, no current tobacco abuse. Mother with MI in her 22s, father with MI at 58, brothers w/ CAD. Previously on medication for high cholesterol. No current medications.  In the ED, EKG revealed NSR w/ 71mm ST elevations in II, III, aVF and V5, V6 meeting diagnostic criteria for an inferolateral STEMI. Code STEMI was called, and she was transported emergently to the cardiac cath lab. I placed a drug-eluting stent in her dominant mid RCA for a hazy 80% plaque. The remainder of her coronary anatomy was free of significant disease in her function was preserved. She was discharged in stable condition. Since that time she has seen [place] PA-C in the office in followup. She does deny chest pain or shortness of breath. She has lost 40 pounds as made lifestyle modifications.since I saw her back in the office a year ago she's remained currently stable specifically denying chest pain or shortness of breath. She remains on double and triple therapy. She unfortunately has been diagnosed with breast cancer and needs mastectomy and is coming back for routine follow up as well as cardiac clearance. I think given the fact that her MI was only a year ago and  the remainder of her anatomy was unremarkable she can have this done at low risk without a functional study prior to the procedure.   Current Outpatient Prescriptions  Medication Sig Dispense Refill  . aspirin 81 MG chewable tablet Chew 81 mg by mouth daily.    [name] atorvastatin (LIPITOR) 80 MG tablet TAKE ONE TABLET BY MOUTH ONE TIME DAILY AT 6 P.M.  30 tablet 0  . Magnesium 250 MG TABS Take 250 mg by mouth daily.    . metoprolol (LOPRESSOR) 50 MG tablet TAKE ONE TABLET BY MOUTH TWICE DAILY  60 tablet 0  . Ticagrelor (BRILINTA) 90 MG TABS tablet Take 1 tablet (90 mg total) by mouth 2 (two) times daily. 60 tablet 11  . clopidogrel (PLAVIX) 75 MG tablet Take 1 tablet (75 mg total) by mouth daily. 90 tablet 3   No current facility-administered medications for this visit.    Allergies  Allergen Reactions  . Codeine     Nausea and headache  . Prednisone Other (See Comments)    "made poison ivy worse", rebound effect when take prednisone    History   Social History  . Marital Status: Single    Spouse Name: N/A  . Number of Children: N/A  . Years of Education: N/A   Occupational History  . Social Worker    Social History Main Topics  . Smoking status: Former Smoker -- 1.00 packs/day for 40 years    Types: Cigarettes    Quit date: 07/30/2009  . Smokeless tobacco: Former Systems developer  Quit date: 01/17/2013  . Alcohol Use: Yes     Comment: 3-4 times a year  . Drug Use: No  . Sexual Activity: Not on file   Other Topics Concern  . Not on file   Social History Narrative   Divorced, 2 children.      Review of Systems: General: negative for chills, fever, night sweats or weight changes.  Cardiovascular: negative for chest pain, dyspnea on exertion, edema, orthopnea, palpitations, paroxysmal nocturnal dyspnea or shortness of breath Dermatological: negative for rash Respiratory: negative for cough or wheezing Urologic: negative for hematuria Abdominal: negative for nausea, vomiting,  diarrhea, bright red blood per rectum, melena, or hematemesis Neurologic: negative for visual changes, syncope, or dizziness All other systems reviewed and are otherwise negative except as noted above.    Blood pressure 130/72, pulse 85, height 5\' 9"  (1.753 m), weight 230 lb 9.6 oz (104.599 kg).  General appearance: alert and no distress Neck: no adenopathy, no carotid bruit, no JVD, supple, symmetrical, trachea midline and thyroid not enlarged, symmetric, no tenderness/mass/nodules Lungs: clear to auscultation bilaterally Heart: regular rate and rhythm, S1, S2 normal, no murmur, click, rub or gallop Extremities: extremities normal, atraumatic, no cyanosis or edema  EKG normal sinus rhythm 85 without ST or T-wave changes. I personally reviewed this EKG  ASSESSMENT AND PLAN:   ST elevation myocardial infarction (STEMI) of inferolateral wall Status post ST segment elevation myocardial infarction 01/17/14 treated with drug-eluting stent to the RCA with otherwise no significant CAD and normal LV function. She denies chest pain or shortness of breath. She is on dual antiplatelet therapy. She unfortunately has recently been diagnosed with breast cancer and needs mastectomy. I think she can have this done a low cardiovascular risk without functional study. I've told her she can stop her Brilenta one week prior to her surgery and restart Plavix as soon after the surgery as her surgeon feels that it's safe to do so.   HTN (hypertension) History of hypertension the blood pressure measured today at 130/72. She is on metoprolol. Continue current meds at current dosing   Dyslipidemia History of hyperlipidemia on atorvastatin 80 mg a day followed by her PCP       [name] [facility], [facility] 01/08/2015 5:26 PM

## 2015-01-08 NOTE — Assessment & Plan Note (Signed)
Status post ST segment elevation myocardial infarction 01/17/14 treated with drug-eluting stent to the RCA with otherwise no significant CAD and normal LV function. She denies chest pain or shortness of breath. She is on dual antiplatelet therapy. She unfortunately has recently been diagnosed with breast cancer and needs mastectomy. I think she can have this done a low cardiovascular risk without functional study. I've told her she can stop her Brilenta one week prior to her surgery and restart Plavix as soon after the surgery as her surgeon feels that it's safe to do so.

## 2015-01-08 NOTE — Assessment & Plan Note (Signed)
History of hyperlipidemia on atorvastatin 80 mg a day followed by her PCP 

## 2015-01-08 NOTE — Assessment & Plan Note (Signed)
History of hypertension the blood pressure measured today at 130/72. She is on metoprolol. Continue current meds at current dosing

## 2015-01-12 ENCOUNTER — Encounter: Payer: Self-pay | Admitting: Cardiovascular Disease

## 2015-01-13 ENCOUNTER — Other Ambulatory Visit (INDEPENDENT_AMBULATORY_CARE_PROVIDER_SITE_OTHER): Payer: Self-pay | Admitting: Surgery

## 2015-01-13 DIAGNOSIS — C50911 Malignant neoplasm of unspecified site of right female breast: Secondary | ICD-10-CM

## 2015-01-14 ENCOUNTER — Encounter: Payer: Self-pay | Admitting: Family Medicine

## 2015-01-15 ENCOUNTER — Other Ambulatory Visit (INDEPENDENT_AMBULATORY_CARE_PROVIDER_SITE_OTHER): Payer: Self-pay | Admitting: Surgery

## 2015-01-15 DIAGNOSIS — C50911 Malignant neoplasm of unspecified site of right female breast: Secondary | ICD-10-CM

## 2015-01-15 MED ORDER — CHLORHEXIDINE GLUCONATE 4 % EX LIQD: 1.0000 "application " | Freq: Once | CUTANEOUS | Status: DC

## 2015-01-15 MED ORDER — DEXTROSE 5 % IV SOLN: 2.0000 g | INTRAVENOUS | Status: AC

## 2015-01-18 ENCOUNTER — Telehealth: Payer: Self-pay | Admitting: *Deleted

## 2015-01-18 NOTE — Telephone Encounter (Signed)
Left message for a return phone call to schedule follow up with Dr. Lindi Adie.  Awaiting patient response.

## 2015-01-19 ENCOUNTER — Telehealth: Payer: Self-pay | Admitting: *Deleted

## 2015-01-19 NOTE — Telephone Encounter (Signed)
Received call back from patient.  Confirmed appt for 3/29 at 1015am.  Encourage her to call with any needs or concerns.

## 2015-01-21 ENCOUNTER — Encounter (HOSPITAL_COMMUNITY): Payer: Self-pay

## 2015-01-21 ENCOUNTER — Encounter (HOSPITAL_COMMUNITY)
Admission: RE | Admit: 2015-01-21 | Discharge: 2015-01-21 | Disposition: A | Discharge status: Home / Self Care | Payer: BLUE CROSS/BLUE SHIELD | Source: Ambulatory Visit | Attending: Surgery | Admitting: Surgery

## 2015-01-21 DIAGNOSIS — Z01812 Encounter for preprocedural laboratory examination: Secondary | ICD-10-CM | POA: Diagnosis not present

## 2015-01-21 DIAGNOSIS — C50411 Malignant neoplasm of upper-outer quadrant of right female breast: Secondary | ICD-10-CM | POA: Insufficient documentation

## 2015-01-21 HISTORY — DX: Unspecified cataract: H26.9

## 2015-01-21 HISTORY — DX: Personal history of other diseases of the respiratory system: Z87.09

## 2015-01-21 HISTORY — DX: Other skin changes: R23.8

## 2015-01-21 HISTORY — DX: Pain in unspecified joint: M25.50

## 2015-01-21 HISTORY — DX: Spontaneous ecchymoses: R23.3

## 2015-01-21 LAB — BASIC METABOLIC PANEL WITH GFR
Anion gap: 9 (ref 5–15)
BUN: 19 mg/dL (ref 6–23)
CO2: 25 mmol/L (ref 19–32)
Calcium: 9.2 mg/dL (ref 8.4–10.5)
Chloride: 105 mmol/L (ref 96–112)
Creatinine, Ser: 0.73 mg/dL (ref 0.50–1.10)
GFR calc Af Amer: >90 mL/min
GFR calc non Af Amer: 88 mL/min — ABNORMAL LOW
Glucose, Bld: 107 mg/dL — ABNORMAL HIGH (ref 70–99)
Potassium: 3.8 mmol/L (ref 3.5–5.1)
Sodium: 139 mmol/L (ref 135–145)

## 2015-01-21 LAB — CBC
HEMATOCRIT: 36.5 % (ref 36.0–46.0)
Hemoglobin: 11.8 g/dL — ABNORMAL LOW (ref 12.0–15.0)
MCH: 29.2 pg (ref 26.0–34.0)
MCHC: 32.3 g/dL (ref 30.0–36.0)
MCV: 90.3 fL (ref 78.0–100.0)
PLATELETS: 297 10*3/uL (ref 150–400)
RBC: 4.04 MIL/uL (ref 3.87–5.11)
RDW: 12.5 % (ref 11.5–15.5)
WBC: 6.9 10*3/uL (ref 4.0–10.5)

## 2015-01-21 NOTE — Pre-Procedure Instructions (Signed)
GISELL BUEHRLE  01/21/2015   Your procedure is scheduled on:  Wed, Mar 23 @ 12:00 PM  Report to [facility] En[facility] at 10:00 AM.  Call this number if you have problems the morning of surgery: [phone]   Remember:   Do not eat food or drink liquids after midnight.   Take these medicines the morning of surgery with A SIP OF WATER: Metoprolol(Lopressor)               Stop taking your Plavix and Brilinta as you have been instructed by Dr.Berry. No Goody's,BC's,Aleve,Ibuprofen,Fish Oil,or any Herbal Medications.    Do not wear jewelry, make-up or nail polish.  Do not wear lotions, powders, or perfumes. You may wear deodorant.  Do not shave 48 hours prior to surgery.   Do not bring valuables to the hospital.  [facility] is not responsible                  for any belongings or valuables.               Contacts, dentures or bridgework may not be worn into surgery.  Leave suitcase in the car. After surgery it may be brought to your room.  For patients admitted to the hospital, discharge time is determined by your                treatment team.               Patients discharged the day of surgery will not be allowed to drive  home.    Special Instructions: [place] - Preparing for Surgery  Before surgery, you can play an important role.  Because skin is not sterile, your skin needs to be as free of germs as possible.  You can reduce the number of germs on you skin by washing with CHG (chlorahexidine gluconate) soap before surgery.  CHG is an antiseptic cleaner which kills germs and bonds with the skin to continue killing germs even after washing.  Please DO NOT use if you have an allergy to CHG or antibacterial soaps.  If your skin becomes reddened/irritated stop using the CHG and inform your nurse when you arrive at Short Stay.  Do not shave (including legs and underarms) for at least 48 hours prior to the first CHG shower.  You may shave your face.  Please follow these  instructions carefully:   1.  Shower with CHG Soap the night before surgery and the                                morning of Surgery.  2.  If you choose to wash your hair, wash your hair first as usual with your       normal shampoo.  3.  After you shampoo, rinse your hair and body thoroughly to remove the                      Shampoo.  4.  Use CHG as you would any other liquid soap.  You can apply chg directly       to the skin and wash gently with scrungie or a clean washcloth.  5.  Apply the CHG Soap to your body ONLY FROM THE NECK DOWN.        Do not use on open wounds or open sores.  Avoid contact with your eyes,  ears, mouth and genitals (private parts).  Wash genitals (private parts)       with your normal soap.  6.  Wash thoroughly, paying special attention to the area where your surgery        will be performed.  7.  Thoroughly rinse your body with warm water from the neck down.  8.  DO NOT shower/wash with your normal soap after using and rinsing off       the CHG Soap.  9.  Pat yourself dry with a clean towel.            10.  Wear clean pajamas.            11.  Place clean sheets on your bed the night of your first shower and do not        sleep with pets.  Day of Surgery  Do not apply any lotions/deoderants the morning of surgery.  Please wear clean clothes to the hospital/surgery center.     Please read over the following fact sheets that you were given: Pain Booklet, Coughing and Deep Breathing and Surgical Site Infection Prevention

## 2015-01-21 NOTE — Progress Notes (Addendum)
Dr.Berry is cardiologist with last visit in epic   EKG in epic from 01-08-15  Heart cath in epic from 2015  Medical Md is Dr.Sharon Stephanie Acre  Stress test done 34 yrs ago  Denies ever having an echo

## 2015-01-22 NOTE — H&P (Signed)
  Subjective:    Patient ID: Mariah Kelley is a 64 y.o. female.  HPI  Patient of Drs. Vincente Liberty that presents following recent diagnosis of right breast IDC ER/PR positive HER-2 negative. Patient has 2 separate masses along with 2 additional satellite nodules. The biopsy proven breast cancer was 2.1 cm, 2.2 cm area of calcification was biopsy-proven to be DCIS and possible satellite nodules measuring 6 mm and 4 mm were also noted total span measuring 5.1 cm. The right axillary lymph node was biopsy-proven to be negative. Given extent of disease, plan for mastectomy.  Patient had a history of acute myocardial infarction one year ago and underwent stent placement with current Berlinta. Referral placed to Dr. Gwenlyn Found for cardiac clearance. Per pt, she should be off Berlinta soon as the length of treatment planned was one year. Highest wt 332 lb, goal would be 60 lb more.  Current DD bra. Is not interested in post mastectomy reconstruction. Here to discuss opposite breast procedures.   Review of Systems  Eyes: Positive for visual disturbance.  Cataract  Musculoskeletal: Positive for arthralgias and neck stiffness.  Hematological: Bruises/bleeds easily.  All other systems reviewed and are negative.      Objective:    Physical Exam  Constitutional: She is oriented to person, place, and time.  Cardiovascular: Normal rate, regular rhythm and normal heart sounds.  Pulmonary/Chest: Effort normal and breath sounds normal.  Abdominal: Soft.  Neurological: She is alert and oriented to person, place, and time.  Psychiatric: She has a normal mood and affect.      bilateral inverted nipples SN to nipple R 36 L 36 cm BW R 24 L 26 cm Nipple to IMF R 14 L14 cm Grade 2 ptosis bilat No axillary adenopathy Assessment:   Right breast cancer UOQ     Plan:      Not interested in post mastectomy reconstruction and plans for prosthesis. Concerned about being able to wear her professional  clothes for work, about weight on back if only one breast. Counseled that it may be difficult to get a good fitting prosthesis that is not onerous in weight for her current size, Encouraged her to visit [facility] to see what they counsel. Offered left reduction to reduce size of needed prosthesis. Discussed possible drain, anchor type incision. Reviewed similar expected length for recovery as mastectomy, expected pain. Discussed risks nipple loss, wound healing problems, changes with wt gain/loss aging. Certainly if she reaches her goal wt of additional 60 lbs, many changes in prosthesis size and shape and size of left breast will occur. States she asked for bilateral mastectomies and Dr. Lucia Gaskins has counseled no benefit to this from cancer standpoint.   At end of discussion today, would like to proceed with left reduction. Will coordinate with Dr Lucia Gaskins, cardiology consult pending. Pictures taken     Irene Limbo, MD [facility] Plastic & Reconstructive Surgery [phone]

## 2015-01-26 MED ORDER — CEFAZOLIN SODIUM-DEXTROSE 2-3 GM-% IV SOLR
2.0000 g | INTRAVENOUS | Status: AC
  Administered 2015-01-27: 2 g via INTRAVENOUS
  Filled 2015-01-26: qty 50

## 2015-01-26 MED ORDER — CHLORHEXIDINE GLUCONATE 4 % EX LIQD
1.0000 "application " | Freq: Once | CUTANEOUS | Status: DC
  Filled 2015-01-26: qty 15

## 2015-01-27 ENCOUNTER — Inpatient Hospital Stay (HOSPITAL_COMMUNITY): Payer: BLUE CROSS/BLUE SHIELD | Admitting: Anesthesiology

## 2015-01-27 ENCOUNTER — Observation Stay (HOSPITAL_COMMUNITY)
Admission: RE | Admit: 2015-01-27 | Discharge: 2015-01-28 | Disposition: A | Discharge status: Home / Self Care | Payer: BLUE CROSS/BLUE SHIELD | Source: Ambulatory Visit | Attending: Surgery | Admitting: Surgery

## 2015-01-27 ENCOUNTER — Encounter (HOSPITAL_COMMUNITY)
Admission: RE | Disposition: A | Discharge status: Home / Self Care | Payer: Self-pay | Source: Ambulatory Visit | Attending: Surgery

## 2015-01-27 ENCOUNTER — Encounter (HOSPITAL_COMMUNITY): Payer: Self-pay | Admitting: Anesthesiology

## 2015-01-27 ENCOUNTER — Encounter (HOSPITAL_COMMUNITY)
Admission: RE | Admit: 2015-01-27 | Discharge: 2015-01-27 | Disposition: A | Discharge status: Home / Self Care | Payer: BLUE CROSS/BLUE SHIELD | Source: Ambulatory Visit | Attending: Surgery | Admitting: Surgery

## 2015-01-27 DIAGNOSIS — Z7982 Long term (current) use of aspirin: Secondary | ICD-10-CM | POA: Insufficient documentation

## 2015-01-27 DIAGNOSIS — E669 Obesity, unspecified: Secondary | ICD-10-CM | POA: Insufficient documentation

## 2015-01-27 DIAGNOSIS — I10 Essential (primary) hypertension: Secondary | ICD-10-CM | POA: Diagnosis not present

## 2015-01-27 DIAGNOSIS — C50911 Malignant neoplasm of unspecified site of right female breast: Secondary | ICD-10-CM

## 2015-01-27 DIAGNOSIS — Z79899 Other long term (current) drug therapy: Secondary | ICD-10-CM | POA: Insufficient documentation

## 2015-01-27 DIAGNOSIS — E785 Hyperlipidemia, unspecified: Secondary | ICD-10-CM | POA: Insufficient documentation

## 2015-01-27 DIAGNOSIS — Z885 Allergy status to narcotic agent status: Secondary | ICD-10-CM | POA: Insufficient documentation

## 2015-01-27 DIAGNOSIS — M1990 Unspecified osteoarthritis, unspecified site: Secondary | ICD-10-CM | POA: Diagnosis not present

## 2015-01-27 DIAGNOSIS — I2510 Atherosclerotic heart disease of native coronary artery without angina pectoris: Secondary | ICD-10-CM | POA: Insufficient documentation

## 2015-01-27 DIAGNOSIS — Z87891 Personal history of nicotine dependence: Secondary | ICD-10-CM | POA: Diagnosis not present

## 2015-01-27 DIAGNOSIS — C50919 Malignant neoplasm of unspecified site of unspecified female breast: Secondary | ICD-10-CM | POA: Diagnosis present

## 2015-01-27 DIAGNOSIS — Z888 Allergy status to other drugs, medicaments and biological substances status: Secondary | ICD-10-CM | POA: Diagnosis not present

## 2015-01-27 DIAGNOSIS — I252 Old myocardial infarction: Secondary | ICD-10-CM | POA: Diagnosis not present

## 2015-01-27 HISTORY — PX: BREAST REDUCTION SURGERY: SHX8

## 2015-01-27 HISTORY — PX: MASTECTOMY W/ SENTINEL NODE BIOPSY: SHX2001

## 2015-01-27 SURGERY — MASTECTOMY WITH SENTINEL LYMPH NODE BIOPSY
Anesthesia: General | Laterality: Right

## 2015-01-27 MED ORDER — FENTANYL CITRATE 0.05 MG/ML IJ SOLN
INTRAMUSCULAR | Status: DC | PRN
  Administered 2015-01-27 (×3): 50 ug via INTRAVENOUS
  Administered 2015-01-27: 100 ug via INTRAVENOUS
  Administered 2015-01-27: 150 ug via INTRAVENOUS
  Administered 2015-01-27 (×2): 50 ug via INTRAVENOUS

## 2015-01-27 MED ORDER — LIDOCAINE HCL (CARDIAC) 20 MG/ML IV SOLN
INTRAVENOUS | Status: DC | PRN
  Administered 2015-01-27: 80 mg via INTRAVENOUS

## 2015-01-27 MED ORDER — LIDOCAINE HCL (CARDIAC) 20 MG/ML IV SOLN
INTRAVENOUS | Status: AC
  Filled 2015-01-27: qty 5

## 2015-01-27 MED ORDER — PROMETHAZINE HCL 25 MG/ML IJ SOLN
6.2500 mg | INTRAMUSCULAR | Status: DC | PRN
  Administered 2015-01-27: 6.25 mg via INTRAVENOUS

## 2015-01-27 MED ORDER — HYDROMORPHONE HCL 1 MG/ML IJ SOLN
0.2500 mg | INTRAMUSCULAR | Status: DC | PRN
  Administered 2015-01-27 (×2): 0.5 mg via INTRAVENOUS

## 2015-01-27 MED ORDER — HEPARIN SODIUM (PORCINE) 5000 UNIT/ML IJ SOLN
5000.0000 [IU] | Freq: Three times a day (TID) | INTRAMUSCULAR | Status: DC
  Administered 2015-01-27 – 2015-01-28 (×2): 5000 [IU] via SUBCUTANEOUS
  Filled 2015-01-27: qty 1

## 2015-01-27 MED ORDER — MIDAZOLAM HCL 2 MG/2ML IJ SOLN
2.0000 mg | Freq: Once | INTRAMUSCULAR | Status: AC
  Administered 2015-01-27: 2 mg via INTRAVENOUS

## 2015-01-27 MED ORDER — LACTATED RINGERS IV SOLN
INTRAVENOUS | Status: DC
  Administered 2015-01-27: 14:00:00 via INTRAVENOUS
  Administered 2015-01-27: 50 mL/h via INTRAVENOUS

## 2015-01-27 MED ORDER — ONDANSETRON HCL 4 MG/2ML IJ SOLN
INTRAMUSCULAR | Status: DC | PRN
  Administered 2015-01-27: 4 mg via INTRAVENOUS

## 2015-01-27 MED ORDER — PROPOFOL 10 MG/ML IV BOLUS
INTRAVENOUS | Status: DC | PRN
  Administered 2015-01-27: 200 mg via INTRAVENOUS

## 2015-01-27 MED ORDER — MIDAZOLAM HCL 2 MG/2ML IJ SOLN
INTRAMUSCULAR | Status: AC
  Filled 2015-01-27: qty 2

## 2015-01-27 MED ORDER — LABETALOL HCL 5 MG/ML IV SOLN
INTRAVENOUS | Status: AC
  Administered 2015-01-27: 5 mg via INTRAVENOUS
  Filled 2015-01-27: qty 4

## 2015-01-27 MED ORDER — ONDANSETRON HCL 4 MG/2ML IJ SOLN
4.0000 mg | Freq: Four times a day (QID) | INTRAMUSCULAR | Status: DC | PRN
  Administered 2015-01-27: 4 mg via INTRAVENOUS
  Filled 2015-01-27: qty 2

## 2015-01-27 MED ORDER — SODIUM CHLORIDE 0.9 % IV SOLN
10.0000 mg | INTRAVENOUS | Status: DC | PRN
  Administered 2015-01-27: 10 ug/min via INTRAVENOUS

## 2015-01-27 MED ORDER — METOPROLOL TARTRATE 50 MG PO TABS
50.0000 mg | ORAL_TABLET | Freq: Two times a day (BID) | ORAL | Status: DC
  Administered 2015-01-27: 50 mg via ORAL
  Filled 2015-01-27: qty 1

## 2015-01-27 MED ORDER — GLYCOPYRROLATE 0.2 MG/ML IJ SOLN
INTRAMUSCULAR | Status: DC | PRN
  Administered 2015-01-27: 0.4 mg via INTRAVENOUS

## 2015-01-27 MED ORDER — ASPIRIN 81 MG PO CHEW
81.0000 mg | CHEWABLE_TABLET | Freq: Every day | ORAL | Status: DC
Start: 2015-01-27 — End: 2015-01-28
  Filled 2015-01-27: qty 1

## 2015-01-27 MED ORDER — MORPHINE SULFATE 2 MG/ML IJ SOLN
1.0000 mg | INTRAMUSCULAR | Status: DC | PRN
  Administered 2015-01-27: 2 mg via INTRAVENOUS
  Filled 2015-01-27: qty 1

## 2015-01-27 MED ORDER — FENTANYL CITRATE 0.05 MG/ML IJ SOLN
INTRAMUSCULAR | Status: AC
  Filled 2015-01-27: qty 5

## 2015-01-27 MED ORDER — ONDANSETRON HCL 4 MG PO TABS: 4.0000 mg | ORAL_TABLET | Freq: Four times a day (QID) | ORAL | Status: DC | PRN

## 2015-01-27 MED ORDER — FENTANYL CITRATE 0.05 MG/ML IJ SOLN
INTRAMUSCULAR | Status: AC
Start: 2015-01-27 — End: 2015-01-27
  Filled 2015-01-27: qty 5

## 2015-01-27 MED ORDER — 0.9 % SODIUM CHLORIDE (POUR BTL) OPTIME
TOPICAL | Status: DC | PRN
  Administered 2015-01-27: 1000 mL

## 2015-01-27 MED ORDER — ONDANSETRON HCL 4 MG/2ML IJ SOLN
INTRAMUSCULAR | Status: AC
  Filled 2015-01-27: qty 2

## 2015-01-27 MED ORDER — FENTANYL CITRATE 0.05 MG/ML IJ SOLN
100.0000 ug | Freq: Once | INTRAMUSCULAR | Status: AC
  Administered 2015-01-27: 50 ug via INTRAVENOUS

## 2015-01-27 MED ORDER — FENTANYL CITRATE 0.05 MG/ML IJ SOLN
INTRAMUSCULAR | Status: AC
  Filled 2015-01-27: qty 2

## 2015-01-27 MED ORDER — HYDROMORPHONE HCL 1 MG/ML IJ SOLN
INTRAMUSCULAR | Status: AC
  Administered 2015-01-27: 0.5 mg via INTRAVENOUS
  Filled 2015-01-27: qty 2

## 2015-01-27 MED ORDER — SCOPOLAMINE 1 MG/3DAYS TD PT72
1.0000 | MEDICATED_PATCH | TRANSDERMAL | Status: DC
  Administered 2015-01-27: 1.5 mg via TRANSDERMAL
  Filled 2015-01-27: qty 1

## 2015-01-27 MED ORDER — SODIUM CHLORIDE 0.9 % IJ SOLN
INTRAMUSCULAR | Status: DC | PRN
  Administered 2015-01-27: 1.5 mL via INTRAMUSCULAR

## 2015-01-27 MED ORDER — PROMETHAZINE HCL 25 MG/ML IJ SOLN
INTRAMUSCULAR | Status: AC
  Filled 2015-01-27: qty 1

## 2015-01-27 MED ORDER — GLYCOPYRROLATE 0.2 MG/ML IJ SOLN
INTRAMUSCULAR | Status: AC
  Filled 2015-01-27: qty 2

## 2015-01-27 MED ORDER — ROCURONIUM BROMIDE 50 MG/5ML IV SOLN
INTRAVENOUS | Status: AC
Start: 2015-01-27 — End: 2015-01-27
  Filled 2015-01-27: qty 1

## 2015-01-27 MED ORDER — METHYLENE BLUE 1 % INJ SOLN
INTRAMUSCULAR | Status: AC
  Filled 2015-01-27: qty 10

## 2015-01-27 MED ORDER — NEOSTIGMINE METHYLSULFATE 10 MG/10ML IV SOLN
INTRAVENOUS | Status: DC | PRN
  Administered 2015-01-27: 2 mg via INTRAVENOUS

## 2015-01-27 MED ORDER — POTASSIUM CHLORIDE IN NACL 20-0.45 MEQ/L-% IV SOLN
INTRAVENOUS | Status: DC
  Administered 2015-01-27 – 2015-01-28 (×2): via INTRAVENOUS
  Filled 2015-01-27 (×3): qty 1000

## 2015-01-27 MED ORDER — LABETALOL HCL 5 MG/ML IV SOLN
5.0000 mg | INTRAVENOUS | Status: AC | PRN
  Administered 2015-01-27 (×2): 5 mg via INTRAVENOUS

## 2015-01-27 MED ORDER — PROPOFOL 10 MG/ML IV BOLUS
INTRAVENOUS | Status: AC
  Filled 2015-01-27: qty 20

## 2015-01-27 MED ORDER — OXYCODONE-ACETAMINOPHEN 5-325 MG PO TABS
1.0000 | ORAL_TABLET | ORAL | Status: DC | PRN
  Administered 2015-01-27 – 2015-01-28 (×3): 1 via ORAL
  Filled 2015-01-27: qty 1
  Filled 2015-01-27: qty 2
  Filled 2015-01-27: qty 1

## 2015-01-27 MED ORDER — ROCURONIUM BROMIDE 100 MG/10ML IV SOLN
INTRAVENOUS | Status: DC | PRN
  Administered 2015-01-27: 50 mg via INTRAVENOUS

## 2015-01-27 MED ORDER — CEFAZOLIN SODIUM-DEXTROSE 2-3 GM-% IV SOLR
INTRAVENOUS | Status: AC
  Filled 2015-01-27: qty 50

## 2015-01-27 SURGICAL SUPPLY — 82 items
APPLIER CLIP 9.375 MED OPEN (MISCELLANEOUS)
APR CLP MED 9.3 20 MLT OPN (MISCELLANEOUS)
ATCH SMKEVC FLXB CAUT HNDSWH (FILTER) ×2 IMPLANT
BAG DECANTER FOR FLEXI CONT (MISCELLANEOUS) ×2 IMPLANT
BINDER BREAST LRG (GAUZE/BANDAGES/DRESSINGS) IMPLANT
BINDER BREAST MEDIUM (GAUZE/BANDAGES/DRESSINGS) IMPLANT
BINDER BREAST XLRG (GAUZE/BANDAGES/DRESSINGS) IMPLANT
BINDER BREAST XXLRG (GAUZE/BANDAGES/DRESSINGS) ×2 IMPLANT
BIOPATCH RED 1 DISK 7.0 (GAUZE/BANDAGES/DRESSINGS) ×2 IMPLANT
BLADE HEX COATED 2.75 (ELECTRODE) ×3 IMPLANT
BLADE SURG 10 STRL SS (BLADE) ×14 IMPLANT
BLADE SURG 15 STRL LF DISP TIS (BLADE) ×4 IMPLANT
BLADE SURG 15 STRL SS (BLADE) ×6
BNDG GAUZE ELAST 4 BULKY (GAUZE/BANDAGES/DRESSINGS) ×4 IMPLANT
CANISTER SUCTION 1200CC (MISCELLANEOUS) ×3 IMPLANT
CANISTER SUCTION 2500CC (MISCELLANEOUS) ×3 IMPLANT
CHLORAPREP W/TINT 26ML (MISCELLANEOUS) ×6 IMPLANT
CLIP APPLIE 9.375 MED OPEN (MISCELLANEOUS) IMPLANT
CONT SPEC 4OZ CLIKSEAL STRL BL (MISCELLANEOUS) ×5 IMPLANT
COVER PROBE W GEL 5X96 (DRAPES) ×3 IMPLANT
COVER SURGICAL LIGHT HANDLE (MISCELLANEOUS) ×3 IMPLANT
DECANTER SPIKE VIAL GLASS SM (MISCELLANEOUS) IMPLANT
DRAIN CHANNEL 15F RND FF W/TCR (WOUND CARE) ×3 IMPLANT
DRAIN CHANNEL 19F RND (DRAIN) ×4 IMPLANT
DRAPE CHEST BREAST 15X10 FENES (DRAPES) ×1 IMPLANT
DRAPE LAPAROSCOPIC ABDOMINAL (DRAPES) ×2 IMPLANT
DRAPE PROXIMA HALF (DRAPES) ×3 IMPLANT
DRSG PAD ABDOMINAL 8X10 ST (GAUZE/BANDAGES/DRESSINGS) ×6 IMPLANT
ELECT BLADE 4.0 EZ CLEAN MEGAD (MISCELLANEOUS) ×3
ELECT CAUTERY BLADE 6.4 (BLADE) ×3 IMPLANT
ELECT COATED BLADE 2.86 ST (ELECTRODE) ×3 IMPLANT
ELECT REM PT RETURN 9FT ADLT (ELECTROSURGICAL) ×3
ELECTRODE BLDE 4.0 EZ CLN MEGD (MISCELLANEOUS) ×2 IMPLANT
ELECTRODE REM PT RTRN 9FT ADLT (ELECTROSURGICAL) ×3 IMPLANT
EVACUATOR SILICONE 100CC (DRAIN) ×3 IMPLANT
EVACUATOR SMOKE ACCUVAC VALLEY (FILTER) ×1
GAUZE SPONGE 4X4 12PLY STRL (GAUZE/BANDAGES/DRESSINGS) ×3 IMPLANT
GLOVE BIO SURGEON STRL SZ 6 (GLOVE) ×6 IMPLANT
GLOVE BIOGEL PI IND STRL 7.0 (GLOVE) ×1 IMPLANT
GLOVE BIOGEL PI INDICATOR 7.0 (GLOVE) ×1
GLOVE SURG SIGNA 7.5 PF LTX (GLOVE) ×3 IMPLANT
GLOVE SURG SS PI 7.0 STRL IVOR (GLOVE) ×1 IMPLANT
GOWN STRL REUS W/ TWL LRG LVL3 (GOWN DISPOSABLE) ×6 IMPLANT
GOWN STRL REUS W/ TWL XL LVL3 (GOWN DISPOSABLE) ×2 IMPLANT
GOWN STRL REUS W/TWL LRG LVL3 (GOWN DISPOSABLE) ×9
GOWN STRL REUS W/TWL XL LVL3 (GOWN DISPOSABLE) ×3
KIT BASIN OR (CUSTOM PROCEDURE TRAY) ×3 IMPLANT
KIT ROOM TURNOVER OR (KITS) ×3 IMPLANT
LIQUID BAND (GAUZE/BANDAGES/DRESSINGS) ×5 IMPLANT
MARKER SKIN DUAL TIP RULER LAB (MISCELLANEOUS) ×3 IMPLANT
NDL 18GX1X1/2 (RX/OR ONLY) (NEEDLE) ×2 IMPLANT
NDL HYPO 25GX1X1/2 BEV (NEEDLE) ×2 IMPLANT
NDL HYPO 25X1 1.5 SAFETY (NEEDLE) IMPLANT
NDL SPNL 22GX3.5 QUINCKE BK (NEEDLE) ×1 IMPLANT
NEEDLE 18GX1X1/2 (RX/OR ONLY) (NEEDLE) ×3 IMPLANT
NEEDLE HYPO 25GX1X1/2 BEV (NEEDLE) ×3 IMPLANT
NEEDLE HYPO 25X1 1.5 SAFETY (NEEDLE) IMPLANT
NEEDLE SPNL 22GX3.5 QUINCKE BK (NEEDLE) ×3 IMPLANT
NS IRRIG 1000ML POUR BTL (IV SOLUTION) ×4 IMPLANT
PACK GENERAL/GYN (CUSTOM PROCEDURE TRAY) ×5 IMPLANT
PAD ARMBOARD 7.5X6 YLW CONV (MISCELLANEOUS) ×3 IMPLANT
PREFILTER EVAC NS 1 1/3-3/8IN (MISCELLANEOUS) ×3 IMPLANT
SPECIMEN JAR X LARGE (MISCELLANEOUS) ×3 IMPLANT
SPONGE GAUZE 4X4 12PLY STER LF (GAUZE/BANDAGES/DRESSINGS) ×1 IMPLANT
SPONGE LAP 18X18 X RAY DECT (DISPOSABLE) ×6 IMPLANT
STAPLER VISISTAT 35W (STAPLE) ×4 IMPLANT
SUT ETHILON 2 0 FS 18 (SUTURE) ×8 IMPLANT
SUT MNCRL AB 4-0 PS2 18 (SUTURE) ×11 IMPLANT
SUT SILK 2 0 FS (SUTURE) ×3 IMPLANT
SUT VIC AB 3-0 PS2 18 (SUTURE) ×9
SUT VIC AB 3-0 PS2 18XBRD (SUTURE) ×6 IMPLANT
SUT VIC AB 3-0 SH 18 (SUTURE) ×5 IMPLANT
SUT VIC AB 3-0 SH 27 (SUTURE)
SUT VIC AB 3-0 SH 27X BRD (SUTURE) IMPLANT
SUT VICRYL 4-0 PS2 18IN ABS (SUTURE) ×11 IMPLANT
SYR 50ML LL SCALE MARK (SYRINGE) ×2 IMPLANT
SYR CONTROL 10ML LL (SYRINGE) ×3 IMPLANT
TOWEL OR 17X24 6PK STRL BLUE (TOWEL DISPOSABLE) ×9 IMPLANT
TOWEL OR 17X26 10 PK STRL BLUE (TOWEL DISPOSABLE) ×3 IMPLANT
TUBE CONNECTING 12X1/4 (SUCTIONS) ×2 IMPLANT
TUBE CONNECTING 20X1/4 (TUBING) ×3 IMPLANT
YANKAUER SUCT BULB TIP NO VENT (SUCTIONS) ×3 IMPLANT

## 2015-01-27 NOTE — H&P (View-Only) (Signed)
Re:   Mariah Kelley DOB:   30-Nov-1950 MRN:   [id number]   Breast [facility]DC consultationi  ASSESSMENT AND PLAN: 1.  Right breast cancer, UOQ  Oncology - [facility]  Her cancer covers a potential 5.1 cm - she wants to go ahead with a mastectomy  I discussed the options for breast cancer treatment with the patient.  I discussed a multidisciplinary approach to the treatment of breast cancer, which includes medical oncology and radiation oncology.  I discussed the surgical options of lumpectomy vs. mastectomy.  If mastectomy, there is the possibility of reconstruction.   I discussed the options of lymph node biopsy.  The treatment plan depends on the pathologic staging of the tumor and the patient's personal wishes.  The risks of surgery include, but are not limited to, bleeding, infection, the need for further surgery, and nerve injury.  The patient has been given literature on the treatment of breast cancer.  Plan: 1)  Card clearance, 2.) see plastic surgery (she wants to consider left breast reduction, but she is not sure that she wants anything done to the right side), 3) right mastectomy with SLNBx, 3) Probable Oncotype  2.  STEMI - 01/17/2014  Has a stent  Followed by Dr. Quay Burow - will need clearance  3. Obese 4.  HTN 5.  Quit smoking about 5 years ago 6.  On Brilinta  No chief complaint on file.  REFERRING PHYSICIAN:  Dr. Christene Slates  HISTORY OF PRESENT ILLNESS: Mariah Kelley is a 64 y.o. (DOB: 02-May-1951)  white  female whose primary care physician is [name], MD and comes to the [facility] [place] today for newly diagnosed right breast cancer. She is here with [name], a friend and she lives with her.  She had a biopsy in June 2015 which was benign.  She was supposed to follow up in 6 months, but time got by. [facility] the [facility] when she felt a right breast lump. She has no fam history of breast cancer.  She is not on hormone medication.  Mammogram  at [facility] on 12/11/2014 shows a 2.0 cm round mass.  On 12/17/2014 she ahd a biopsy of her right breast - [id number] - Invasive ductal ca, ER/PR positive, Her2Neu - neg, Ki67 - 20%, and benign lymph node. On 12/24/2014 she had a biopsy of the right breast - [id number] - at least ADH (maybe DCIS)  MRI of breast 12/29/2014 - 1. 2.1 cm mass in the upper-outer quadrant of the right breast consistent with invasive ductal carcinoma and ductal carcinoma in situ a shown recent core biopsy. There 2 possible satellite nodules associated with this mass. See above.  2. Non mass enhancement medial and superior to this mass, also recently biopsied measuring approximately 2.2 cm.  3. Measured together the entire region of enhancement is 5.1 cm.   Past Medical History  Diagnosis Date  . Hypertension   . History of tobacco abuse     40 pack-years  . Obesity   . Hyperlipidemia   . ST elevation myocardial infarction (STEMI) of inferolateral wall 01/17/2014  . CAD S/P percutaneous coronary angioplasty 01/17/2014    - inferior STEMI: Mid RCA PCI Promus DES 3.0 mm x 12 mm (3.5 mm)  . Metabolic syndrome   . Breast cancer of upper-outer quadrant of right female breast 12/23/2014  . Arthritis       Past Surgical History  Procedure Laterality Date  . Abdominal hysterectomy    .  Tubule ligation    . Spinal fusion    . Appendectomy    . Percutaneous coronary stent intervention (pci-s)    . Left heart cath Bilateral 01/17/2014    Procedure: LEFT HEART CATH;  Surgeon: Lorretta Harp, MD;  Location: [facility] CATH LAB;  Service: Cardiovascular;  Laterality: Bilateral;  . Cholecystectomy        Current Outpatient Prescriptions  Medication Sig Dispense Refill  . aspirin 81 MG chewable tablet Chew 81 mg by mouth daily.    [name] atorvastatin (LIPITOR) 80 MG tablet TAKE ONE TABLET BY MOUTH ONE TIME DAILY AT 6 P.M.  30 tablet 0  . Magnesium 250 MG TABS Take 250 mg by mouth daily.    . metoprolol (LOPRESSOR) 50 MG tablet TAKE ONE  TABLET BY MOUTH TWICE DAILY  60 tablet 0  . Ticagrelor (BRILINTA) 90 MG TABS tablet Take 1 tablet (90 mg total) by mouth 2 (two) times daily. 60 tablet 11   No current facility-administered medications for this visit.      Allergies  Allergen Reactions  . Codeine     Nausea and headache  . Prednisone Other (See Comments)    "made poison ivy worse", rebound effect when take prednisone    REVIEW OF SYSTEMS: Skin:  No history of rash.  No history of abnormal moles. Infection:  No history of hepatitis or HIV.  No history of MRSA. Neurologic:  No history of stroke.  No history of seizure.  No history of headaches. Cardiac: STEMI - 01/17/2014.   Has a stent.   Followed by Dr. Quay Burow Pulmonary:  Smoked until 2011.  Endocrine:  No diabetes. No thyroid disease. Gastrointestinal:  No history of stomach disease.  No history of liver disease.  No history of gall bladder disease.  No history of pancreas disease.  No history of colon disease. Urologic:  No history of kidney stones.  No history of bladder infections. Musculoskeletal:  Arthritis Hematologic:  On Brilinta for her stent. Psycho-social:  The patient is oriented.   The patient has no obvious psychologic or social impairment to understanding our conversation and plan.  SOCIAL and FAMILY HISTORY: Divorced She is here with [name], a friend and she lives with her. 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 Has 3 children - son 21, son 105, and daughter 90  PHYSICAL EXAM: There were no vitals taken for this visit.  General: Obese WF who is alert and generally healthy appearing.  HEENT: Normal. Pupils equal. Neck: Supple. No mass.  No thyroid mass. Lymph Nodes:  No supraclavicular, cervical, or axillary nodes. Breast:  Right  Bruise at 11 o'clock.  There is a mass effect there.  Left:  normal  Lungs: Clear to auscultation and symmetric breath sounds. Heart:   RRR. No murmur or rub. Abdomen: Soft. No mass. No tenderness. No hernia. Normal bowel sounds. Rectal: Not done. Extremities:  Good strength and ROM  in upper and lower extremities. Neurologic:  Grossly intact to motor and sensory function. Psychiatric: Has normal mood and affect. Behavior is normal.   DATA REVIEWED: Epic notes.  Alphonsa Overall, MD,  [facility], [address]  [place], [place]    [place] Phone:  [phone] FAX:  [phone]

## 2015-01-27 NOTE — Anesthesia Procedure Notes (Signed)
Procedure Name: Intubation Date/Time: 01/27/2015 12:29 PM Performed by: Rush Farmer E Pre-anesthesia Checklist: Patient identified, Emergency Drugs available, Suction available, Patient being monitored and Timeout performed Patient Re-evaluated:Patient Re-evaluated prior to inductionOxygen Delivery Method: Circle system utilized Preoxygenation: Pre-oxygenation with 100% oxygen Intubation Type: IV induction Ventilation: Mask ventilation without difficulty Laryngoscope Size: Mac and 3 Grade View: Grade I Tube type: Oral Tube size: 7.0 mm Number of attempts: 1 Airway Equipment and Method: Stylet Placement Confirmation: ETT inserted through vocal cords under direct vision,  positive ETCO2 and breath sounds checked- equal and bilateral Secured at: 21 cm Tube secured with: Tape Dental Injury: Teeth and Oropharynx as per pre-operative assessment

## 2015-01-27 NOTE — Anesthesia Preprocedure Evaluation (Addendum)
Anesthesia Evaluation  Patient identified by MRN, date of birth, ID band Patient awake    Reviewed: Allergy & Precautions, NPO status , Patient's Chart, lab work & pertinent test results  History of Anesthesia Complications Negative for: history of anesthetic complications  Airway Mallampati: II  TM Distance: >3 FB Neck ROM: Full    Dental  (+) Partial Upper, Dental Advisory Given   Pulmonary former smoker,    Pulmonary exam normal       Cardiovascular hypertension, Pt. on medications + CAD and + Past MI     Neuro/Psych negative neurological ROS  negative psych ROS   GI/Hepatic negative GI ROS, Neg liver ROS,   Endo/Other  diabetes  Renal/GU negative Renal ROS     Musculoskeletal   Abdominal   Peds  Hematology   Anesthesia Other Findings   Reproductive/Obstetrics                           Anesthesia Physical Anesthesia Plan  ASA: III  Anesthesia Plan: General   Post-op Pain Management:    Induction: Intravenous  Airway Management Planned: Oral ETT  Additional Equipment:   Intra-op Plan:   Post-operative Plan: Extubation in OR  Informed Consent: I have reviewed the patients History and Physical, chart, labs and discussed the procedure including the risks, benefits and alternatives for the proposed anesthesia with the patient or authorized representative who has indicated his/her understanding and acceptance.   Dental advisory given  Plan Discussed with: CRNA, Anesthesiologist and Surgeon  Anesthesia Plan Comments:         Anesthesia Quick Evaluation

## 2015-01-27 NOTE — Interval H&P Note (Signed)
History and Physical Interval Note:  01/27/2015 7:18 AM  Mariah Kelley  has presented today for surgery, with the diagnosis of right breast cancer  The various methods of treatment have been discussed with the patient and family. After consideration of risks, benefits and other options for treatment, the patient has consented to  Procedure(s): RIGHT MASTECTOMY WITH RIGHT AXILLARY  SENTINEL LYMPH NODE BIOPSY (Right) MAMMARY REDUCTION  LEFT (BREAST) (Left) as a surgical intervention .  The patient's history has been reviewed, patient examined, no change in status, stable for surgery.  I have reviewed the patient's chart and labs.  Questions were answered to the patient's satisfaction.     Satoria Dunlop

## 2015-01-27 NOTE — Op Note (Signed)
Operative Note   DATE OF OPERATION: 3.23.2016  LOCATION: [facility]-observation  SURGICAL DIVISION: Plastic Surgery  PREOPERATIVE DIAGNOSES:  1. Right breast cancer  POSTOPERATIVE DIAGNOSES:  same  PROCEDURE:  Left breast reduction  SURGEON: Irene Limbo MD MBA  ASSISTANT: none  ANESTHESIA:  General.   EBL: 200 ml for both procedures  COMPLICATIONS: None immediate.   INDICATIONS FOR PROCEDURE:  The patient, Mariah Kelley, is a 64 y.o. female born on 1951-06-25, is here for left breast reduction. Patient is undergoing mastectomy on right for treatment of cancer and plan for left reduction to aid with use of prosthesis given her breast size.    FINDINGS: 502 g reduction left breast  DESCRIPTION OF PROCEDURE:  The patient's operative site was marked with the patient in the preoperative area including sternal notch, breast meridian, anterior axillary line and inframammary fold. The location for nipple areolar complex marked on anterior surface left breast by palpation. With aid of Wise pattern marker, location of new nipple areolar complex and vertical limbs (8 cm) were marked. The patient was taken to the operating room. SCDs were placed and IV antibiotics were given. The patient's operative site was prepped and draped in a sterile fashion. A time out was performed and all information was confirmed to be correct.   Over left breast, inferior pedicle marked and nipple areolar complex marked with 50 mm diameter marker. Medial and lateral triangles of skin and breast tissue excised, taking care to leave layers of fascia and breast tissue over muscle layer. Additional tissue excised from superior pole and medial and lateral skin flaps developed for redraping. Breast tailor tacked closed.   Breast cavity irrigated and hemostasis obtained. 15 Fr drain placed in cavity. Over left breast, closure completed with 3-0 vicryl to approximate dermis along inframammary fold. NAC inset and  vertical limb closed with 4-0 vicryl in dermis.Skin closure completed with 4-0 monocryl subcuticular.Tissue adhesive, followed by dry dressing and breast and abdominal binders applied.  The patient was allowed to wake from anesthesia, extubated and taken to the recovery room in satisfactory condition.   SPECIMENS: left breast tissue  DRAINS: 10 Fr JP in left breast  Irene Limbo, MD [facility] Plastic & Reconstructive Surgery [phone]

## 2015-01-27 NOTE — Op Note (Signed)
01/27/2015  2:12 PM  PATIENT:  Mariah Kelley, 64 y.o., female, MRN: [id number]  PREOP DIAGNOSIS:  right breast cancer. Ptotic left breast  POSTOP DIAGNOSIS:   Right breast cancer, 11 o'clock position (T2, N0), Ptotic left breast  PROCEDURE:   Procedure(s): RIGHT MASTECTOMY WITH RIGHT AXILLARY  SENTINEL LYMPH NODE BIOPSY - Lucia Gaskins MAMMARY REDUCTION  LEFT (BREAST) _[place]  SURGEON:   Alphonsa Overall, M.D.  ASSISTANT:   B. Iran Planas, MD  ANESTHESIA:   general  Anesthesiologist: Duane Boston, MD; Suzette Battiest, MD CRNA: Kyung Rudd, CRNA; Leonor Liv, CRNA  General  ASA:  3  EBL:  100  Ml (for right mastectomy)  BLOOD ADMINISTERED: none  DRAINS: Two 91 F Blake drains on the right  One drain on the left  LOCAL MEDICATIONS USED:   None  SPECIMEN:   Right mastectomy (long suture lateral), right axillary sentinel lymph node biopsy (counts 650, background 5), medial skin of right breast  COUNTS CORRECT:  YES  INDICATIONS FOR PROCEDURE:  [name] is a 64 y.o. (DOB: 1951-02-06) white  female whose primary care physician is [name], MD and comes for right mastectomy with right axillary sentinel lymph node biopsy.   She was seen at the breast [place] with Drs. Christel Mormon.   She is interested in a left breast reduction and has seen Dr. Iran Planas to proceed with this.    The indications and risks of the surgery were explained to the patient.  The risks include, but are not limited to, infection, bleeding, and nerve injury.  OPERATIVE NOTE;  The patient was taken to room # 3 at [facility] where she underwent a general anesthesia  supervised by Anesthesiologist: Duane Boston, MD; Suzette Battiest, MD CRNA: Kyung Rudd, CRNA; Leonor Liv, CRNA. Both her breasts and axilla were prepped with  ChloraPrep and sterilely draped.    A time-out and the surgical check list was reviewed.    I injected about 0.5 mL of 40% methylene blue around her  right areola.   I made an elliptical incision including the areola in the right breast.  I developed skin flaps medially to the lateral edge of the sternum, inferiorly to the investing fascia of the rectus abdominus muscle, laterally to the anterior edge of the latissimus dorsi muscle, and superiorly to about 2 finger breaths below the clavicle.  The breast was reflected off the pectoralis muscle from medial to lateral.  The lateral attachments in the right axilla were divided and the breast removed.  A long suture was placed on the lateral aspect of the breast.   I dissected into the right axilla and found a sentinel lymph node.  The node had counts of 650 with a background count of 5.  The lymph node was not blue.  This was sent as a separate specimen.  Because of excess medial skin, I removed the medial skin to get the mastectomy flaps to lay flat.   I brought out 2 61 F Blake drains below the inferior flaps.  These were sewn in place with 2-0 Nylons.  I irrigated the wound with 2,000 cc of fluid.   The skin was closed with interrupted 3-0 Vicryl sutures and the skin was closed with a 4-0 Monocryl.  The  Wound was painted with Tincture of Benzoin and steristripped with 1/2 inch Steristrips.    A pressure dressing was placed on the wound and the chest wrapped with a breast binder.  Her needle and sponge count were correct at the end of the case.   Dr. Iran Planas will dictate her left breast reduction mammoplasty.  We will keep her overnight.   She was transferred to the recovery room in good condition.  Alphonsa Overall, MD, [facility] Surgery Pager: [phone] Office phone:  [phone]

## 2015-01-27 NOTE — Interval H&P Note (Signed)
History and Physical Interval Note:  01/27/2015 12:02 PM  Mariah Kelley  has presented today for surgery, with the diagnosis of right breast cancer  The various methods of treatment have been discussed with the patient and family.   Dr. Iran Planas plans a breast reduction on the left.  Her friend [name] is at the bedside as is her daughter, Rometta Emery.  After consideration of risks, benefits and other options for treatment, the patient has consented to  Procedure(s): RIGHT MASTECTOMY WITH RIGHT AXILLARY  SENTINEL LYMPH NODE BIOPSY (Right) MAMMARY REDUCTION  LEFT (BREAST) (Left) as a surgical intervention .  The patient's history has been reviewed, patient examined, no change in status, stable for surgery.  I have reviewed the patient's chart and labs.  Questions were answered to the patient's satisfaction.     Rhyse Loux H

## 2015-01-27 NOTE — Transfer of Care (Signed)
Immediate Anesthesia Transfer of Care Note  Patient: Mariah Kelley  Procedure(s) Performed: Procedure(s): RIGHT MASTECTOMY WITH RIGHT AXILLARY  SENTINEL LYMPH NODE BIOPSY (Right) MAMMARY REDUCTION  LEFT (BREAST) (Left)  Patient Location: PACU  Anesthesia Type:General  Level of Consciousness: awake, alert  and oriented  Airway & Oxygen Therapy: Patient Spontanous Breathing and Patient connected to face mask oxygen  Post-op Assessment: Report given to RN, Post -op Vital signs reviewed and stable and Patient moving all extremities X 4  Post vital signs: Reviewed and stable  Last Vitals:  Filed Vitals:   01/27/15 1200  BP: 178/81  Pulse:   Temp:   Resp:     Complications: No apparent anesthesia complications

## 2015-01-28 DIAGNOSIS — C50911 Malignant neoplasm of unspecified site of right female breast: Secondary | ICD-10-CM | POA: Diagnosis not present

## 2015-01-28 MED ORDER — OXYCODONE-ACETAMINOPHEN 5-325 MG PO TABS: 1.0000 | ORAL_TABLET | ORAL | Status: DC | PRN

## 2015-01-28 NOTE — Progress Notes (Signed)
D/c to home via wheelchair with daughter. Pain med given for d/c. VSS. Breathing regular and unlabored on room air. D/c instructions materials and prescriptions given and patient demonstrated understanding via teachback

## 2015-01-28 NOTE — Discharge Summary (Signed)
Physician Discharge Summary  Patient ID:  Mariah Kelley  MRN: [id number]  DOB/AGE: May 22, 1951 64 y.o.  Admit date: 01/27/2015 Discharge date: 01/28/2015  Discharge Diagnoses:  1.  Right breast cancer, UOQ  Final path pending.  2. STEMI - 01/17/2014 Has a stent Followed by Dr. Quay Burow 3. Obese 4. HTN 5. Quit smoking about 5 years ago 6. On Brilinta -  On hold   Active Problems:   Breast cancer, stage 2   Operation: Procedure(s): RIGHT MASTECTOMY WITH RIGHT AXILLARY  SENTINEL LYMPH NODE BIOPSY - D. Seth Higginbotham MAMMARY REDUCTION  LEFT (BREAST) - B. Thimmappa on 01/27/2015  Discharged Condition: good  Hospital Course: Mariah Kelley is an 64 y.o. female whose primary care physician is [name], MD and who was admitted 01/27/2015 with a chief complaint of right breast cancer.   She was brought to the operating room on 01/27/2015 and underwent  RIGHT MASTECTOMY WITH RIGHT AXILLARY  SENTINEL LYMPH NODE BIOPSY and MAMMARY REDUCTION  LEFT (BREAST).   She has done well since surgery.  She thinks that she is ready to go home. She will see me in the office next week. Will restart her anticoagulants on Monday, 3/28. Instructions for emptying the drain reviewed with the patient.  The discharge instructions were reviewed with the patient.  Consults: None  Significant Diagnostic Studies: Results for orders placed or performed during the hospital encounter of 01/21/15  CBC  Result Value Ref Range   WBC 6.9 4.0 - 10.5 K/uL   RBC 4.04 3.87 - 5.11 MIL/uL   Hemoglobin 11.8 (L) 12.0 - 15.0 g/dL   HCT 36.5 36.0 - 46.0 %   MCV 90.3 78.0 - 100.0 fL   MCH 29.2 26.0 - 34.0 pg   MCHC 32.3 30.0 - 36.0 g/dL   RDW 12.5 11.5 - 15.5 %   Platelets 297 150 - 400 K/uL  Basic metabolic panel  Result Value Ref Range   Sodium 139 135 - 145 mmol/L   Potassium 3.8 3.5 - 5.1 mmol/L   Chloride 105 96 - 112 mmol/L   CO2 25 19 - 32 mmol/L    Glucose, Bld 107 (H) 70 - 99 mg/dL   BUN 19 6 - 23 mg/dL   Creatinine, Ser 0.73 0.50 - 1.10 mg/dL   Calcium 9.2 8.4 - 10.5 mg/dL   GFR calc non Af Amer 88 (L) >90 mL/min   GFR calc Af Amer >90 >90 mL/min   Anion gap 9 5 - 15    Mr Breast Bilateral W Wo Contrast  12/29/2014   CLINICAL DATA:  Invasive ductal carcinoma and ductal carcinoma in situ after ultrasound-guided core biopsy of a mass in the 10 o'clock location of the right breast. At least atypical ductal hyperplasia but findings suspicious for ductal carcinoma in situ following stereotactic guided core biopsy of calcifications in the 12 o'clock location of the right breast.  LABS:  BUN and creatinine were obtained on site at [facility] at  [address]  Results:  BUN 28 mg/dL,  Creatinine 0.8 mg/dL.  EXAM: BILATERAL BREAST MRI WITH AND WITHOUT CONTRAST  TECHNIQUE: Multiplanar, multisequence MR images of both breasts were obtained prior to and following the intravenous administration of 20 ml of Multihance.  THREE-DIMENSIONAL MR IMAGE RENDERING ON INDEPENDENT WORKSTATION:  Three-dimensional MR images were rendered by post-processing of the original MR data on an independent workstation. The three-dimensional MR images were interpreted, and findings are reported in the following complete MRI report  for this study. Three dimensional images were evaluated at the independent DynaCad workstation  COMPARISON:  Studies performed at [facility] 12/24/2014 and earlier  FINDINGS: Breast composition: b.  Scattered fibroglandular tissue.  Background parenchymal enhancement: Minimal  Right breast: 2.1 x 1.6 x 1.7 cm mass in the upper-outer quadrant of the right breast shows rapid wash-in and washout enhancement kinetics. Clip is seen 6 mm lateral to this mass, also associated with a small amount of surrounding enhancement. A 4 mm nodule is seen 4 mm immediately posterior to this mass. A 6 mm nodule is seen 8 mm posterior lateral to this  mass, both suspicious for small satellite tumor nodules. There is no mammographic correlate for these nodules.  Non mass enhancement surrounds a dumbbell-shaped clip. The enhancement measures 2.2 x 1.5 x 1.5 cm. This region of enhancement is medial and superior to the discrete mass described above. Measured together, the mass and enhancement is 5.1 x 2.9 x 3.7 cm, inclusive of the possible satellite tumor nodules.  Left breast: No mass or abnormal enhancement.  Lymph nodes: No abnormal appearing lymph nodes. Recent ultrasound-guided core biopsy of a prominent right axillary lymph node was negative for malignancy.  Ancillary findings:  None.  IMPRESSION: 1. 2.1 cm mass in the upper-outer quadrant of the right breast consistent with invasive ductal carcinoma and ductal carcinoma in situ a shown recent core biopsy. There 2 possible satellite nodules associated with this mass. See above. 2. Non mass enhancement medial and superior to this mass, also recently biopsied measuring approximately 2.2 cm. 3. Measured together the entire region of enhancement is 5.1 cm. 4. Consider bracketing with wire localization if the patient is a candidate for breast conservation.  RECOMMENDATION: Consultation regarding treatment plan.  BI-RADS CATEGORY  6: Known biopsy-proven malignancy.   Electronically Signed   By: Nolon Nations M.D.   On: 12/29/2014 13:48   Nm Sentinel Node Inj-no Rpt (breast)  01/27/2015   CLINICAL DATA: right breast cancer   Sulfur colloid was injected intradermally by the nuclear medicine  technologist for breast cancer sentinel node localization.     Discharge Exam:  Filed Vitals:   01/28/15 0603  BP: 129/54  Pulse: 72  Temp: 98.8 F (37.1 C)  Resp: 18    General: WN older WF who is alert and generally healthy appearing.  Lungs: Clear to auscultation and symmetric breath sounds. Heart:  RRR. No murmur or rub. Chest:  Wounds okay.        2 drains right and one drain left.   Discharge  Medications:     Medication List    ASK your doctor about these medications        aspirin 81 MG chewable tablet  Chew 81 mg by mouth daily.     atorvastatin 80 MG tablet  Commonly known as:  LIPITOR  TAKE ONE TABLET BY MOUTH ONE TIME DAILY AT 6 P.M.     clopidogrel 75 MG tablet  Commonly known as:  PLAVIX  Take 1 tablet (75 mg total) by mouth daily.     Magnesium 250 MG Tabs  Take 250 mg by mouth daily.     metoprolol 50 MG tablet  Commonly known as:  LOPRESSOR  TAKE ONE TABLET BY MOUTH TWICE DAILY     ticagrelor 90 MG Tabs tablet  Commonly known as:  BRILINTA  Take 1 tablet (90 mg total) by mouth 2 (two) times daily.        Disposition: 01-Home or  Self Care        Follow-up Information    Follow up with [facility], Arnoldo Hooker, MD In 1 week.   Specialty:  Plastic Surgery   Why:  as scheduled   Contact information:   [place] [address] [phone]        Signed: Alphonsa Overall, M.D., [facility] Surgery Office:  [phone]  01/28/2015, 6:55 AM

## 2015-01-28 NOTE — Progress Notes (Signed)
POD#1 L breast reduction, R mastectomy, SLN  Temp:  [97.1 F (36.2 C)-98.8 F (37.1 C)] 98.8 F (37.1 C) (03/24 0603) Pulse Rate:  [60-91] 72 (03/24 0603) Resp:  [9-27] 18 (03/24 0603) BP: (121-208)/(54-81) 129/54 mmHg (03/24 0603) SpO2:  [94 %-100 %] 100 % (03/24 0603) Weight:  [108.909 kg (240 lb 1.6 oz)] 108.909 kg (240 lb 1.6 oz) (03/23 1020)   L drain 30 ml  Minimal pain on left  PE Left breast soft, dry drainage on dressings, incisions intact, serosanguinous drain  A/P Per [employer], sponge bathe until follow up Drain care Tolerated percocet F/u arranged  Irene Limbo, MD [facility]astic [facility] Reconstruct[facility] Surgery [phone]

## 2015-01-28 NOTE — Discharge Instructions (Signed)
[facility] - DISCHARGE INSTRUCTIONS TO PATIENT  Activity:  Driving - May drive in 4 or 5 days, if doing well   Lifting - Take it easy for 7 days, but use arms as you normally would  Wound Care:   Empty drains twice a day and record the amount of drainage.  Diet:  As tolerated  Follow up appointment:  Call Dr. Pollie Friar office [facility] Surgery) at [phone] for an appointment in next week.  Medications and dosages:  Resume your home medications.  You have a prescription for:  Vicodin and Zofram  Call Dr. Lucia Gaskins or his office  [phone]) if you have:  Temperature greater than 100.4,  Persistent nausea and vomiting,  Severe uncontrolled pain,  Redness, tenderness, or signs of infection (pain, swelling, redness, odor or green/yellow discharge around the site),  Difficulty breathing, headache or visual disturbances,  Any other questions or concerns you may have after discharge.  In an emergency, call 911 or go to an Emergency Department at a nearby hospital.

## 2015-01-29 NOTE — Anesthesia Postprocedure Evaluation (Signed)
  Anesthesia Post-op Note  Patient: Mariah Kelley  Procedure(s) Performed: Procedure(s): RIGHT MASTECTOMY WITH RIGHT AXILLARY  SENTINEL LYMPH NODE BIOPSY (Right) MAMMARY REDUCTION  LEFT (BREAST) (Left)  Patient Location: PACU  Anesthesia Type:General  Level of Consciousness: awake and alert   Airway and Oxygen Therapy: Patient Spontanous Breathing  Post-op Pain: mild  Post-op Assessment: Post-op Vital signs reviewed  Post-op Vital Signs: Reviewed  Last Vitals:  Filed Vitals:   01/28/15 0603  BP: 129/54  Pulse: 72  Temp: 37.1 C  Resp: 18    Complications: No apparent anesthesia complications

## 2015-02-01 ENCOUNTER — Encounter (HOSPITAL_COMMUNITY): Payer: Self-pay | Admitting: Surgery

## 2015-02-02 ENCOUNTER — Telehealth: Payer: Self-pay | Admitting: Hematology and Oncology

## 2015-02-02 ENCOUNTER — Encounter: Payer: Self-pay | Admitting: *Deleted

## 2015-02-02 ENCOUNTER — Ambulatory Visit (HOSPITAL_BASED_OUTPATIENT_CLINIC_OR_DEPARTMENT_OTHER): Payer: BLUE CROSS/BLUE SHIELD | Admitting: Hematology and Oncology

## 2015-02-02 VITALS — BP 162/68 | HR 70 | Temp 97.6°F | Resp 18 | Ht 69.0 in | Wt 231.2 lb

## 2015-02-02 DIAGNOSIS — C50411 Malignant neoplasm of upper-outer quadrant of right female breast: Secondary | ICD-10-CM

## 2015-02-02 NOTE — Progress Notes (Unsigned)
Ordered oncotype per Dr. Lindi Adie order. Faxed requisition to pathology and confirmed receipt with Alyse Low and faxed PAC to [facility] and [place].

## 2015-02-02 NOTE — Progress Notes (Signed)
Patient Care Team: Jonathon Jordan, MD as PCP - General (Family Medicine) Alphonsa Overall, MD as Consulting Physician (General Surgery) Nicholas Lose, MD as Consulting Physician (Hematology and Oncology) Thea Silversmith, MD as Consulting Physician (Radiation Oncology) Mauro Kaufmann, RN as Registered Nurse Rockwell Germany, RN as Registered Nurse Holley Bouche, NP as Nurse Practitioner (Nurse Practitioner) Irene Limbo, MD as Consulting Physician (Plastic Surgery)  DIAGNOSIS: Breast cancer of upper-outer quadrant of right female breast   Staging form: Breast, AJCC 7th Edition     Clinical stage from 12/30/2014: Stage IIA (T2, N0, M0) - Unsigned   SUMMARY OF ONCOLOGIC HISTORY:   Breast cancer of upper-outer quadrant of right female breast   12/17/2014 Initial Biopsy Right breast biopsy: Invasive ductal carcinoma with DCIS, right axillary lymph node benign   12/24/2014 Initial Biopsy Right breast core biopsy: At least atypical ductal hyperplasia with associated calcification but findings suspicious for DCIS   12/29/2014 Breast MRI 2.1 cm mass in the upper-outer quadrant of the right breast 2 possible satellite nodules associated with this mass. NME 2.2cm, together 5.1 cm   01/26/2015 Surgery Right breast lumpectomy: 2.2 cm invasive ductal carcinoma with DCIS, grade 2, sentinel lymph node negative, T2 N0 M0 stage II a, ER 100%, PR 0%, HER-2 negative, Ki-67 21%    CHIEF COMPLIANT: Follow-up after lumpectomy  INTERVAL HISTORY: Mariah Kelley is a 64 year old with above-mentioned history of right-sided breast cancer underwent lumpectomy and is here today to discuss adjuvant treatment options. She is doing very well from the surgery with some soreness in the breast and axilla. She denies any new problems or concerns.  REVIEW OF SYSTEMS:   Constitutional: Denies fevers, chills or abnormal weight loss Eyes: Denies blurriness of vision Ears, nose, mouth, throat, and face: Denies mucositis or  sore throat Respiratory: Denies cough, dyspnea or wheezes Cardiovascular: Denies palpitation, chest discomfort or lower extremity swelling Gastrointestinal:  Denies nausea, heartburn or change in bowel habits Skin: Denies abnormal skin rashes Lymphatics: Denies new lymphadenopathy or easy bruising Neurological:Denies numbness, tingling or new weaknesses Behavioral/Psych: Mood is stable, no new changes  Breast: Denies any pain from recent surgery All other systems were reviewed with the patient and are negative.  I have reviewed the past medical history, past surgical history, social history and family history with the patient and they are unchanged from previous note.  ALLERGIES:  is allergic to codeine and prednisone.  MEDICATIONS:  Current Outpatient Prescriptions  Medication Sig Dispense Refill  . atorvastatin (LIPITOR) 80 MG tablet TAKE ONE TABLET BY MOUTH ONE TIME DAILY AT 6 P.M.  30 tablet 0  . Bisacodyl (DULCOLAX PO) Take 3 tablets by mouth as needed.    . Magnesium Hydroxide (MILK OF MAGNESIA PO) Take by mouth as needed.    . metoprolol (LOPRESSOR) 50 MG tablet TAKE ONE TABLET BY MOUTH TWICE DAILY  60 tablet 0  . aspirin 81 MG chewable tablet Chew 81 mg by mouth daily.    . clopidogrel (PLAVIX) 75 MG tablet Take 1 tablet (75 mg total) by mouth daily. (Patient not taking: Reported on 02/02/2015) 90 tablet 3  . Magnesium 250 MG TABS Take 250 mg by mouth daily.    [name] oxyCODONE-acetaminophen (PERCOCET/ROXICET) 5-325 MG per tablet Take 1-2 tablets by mouth every 4 (four) hours as needed for moderate pain. (Patient not taking: Reported on 02/02/2015) 40 tablet 0  . Ticagrelor (BRILINTA) 90 MG TABS tablet Take 1 tablet (90 mg total) by mouth 2 (two) times daily. (  Patient not taking: Reported on 02/02/2015) 60 tablet 11   No current facility-administered medications for this visit.   Facility-Administered Medications Ordered in Other Visits  Medication Dose Route Frequency Provider Last  Rate Last Dose  . chlorhexidine (HIBICLENS) 4 % liquid 1 application  1 application Topical Once Alphonsa Overall, MD      . chlorhexidine (HIBICLENS) 4 % liquid 1 application  1 application Topical Once Alphonsa Overall, MD        PHYSICAL EXAMINATION: ECOG PERFORMANCE STATUS: 1 - Symptomatic but completely ambulatory  Filed Vitals:   02/02/15 1034  BP: 162/68  Pulse: 70  Temp: 97.6 F (36.4 C)  Resp: 18   Filed Weights   02/02/15 1034  Weight: 231 lb 3.2 oz (104.872 kg)    GENERAL:alert, no distress and comfortable SKIN: skin color, texture, turgor are normal, no rashes or significant lesions EYES: normal, Conjunctiva are pink and non-injected, sclera clear OROPHARYNX:no exudate, no erythema and lips, buccal mucosa, and tongue normal  NECK: supple, thyroid normal size, non-tender, without nodularity LYMPH:  no palpable lymphadenopathy in the cervical, axillary or inguinal LUNGS: clear to auscultation and percussion with normal breathing effort HEART: regular rate & rhythm and no murmurs and no lower extremity edema ABDOMEN:abdomen soft, non-tender and normal bowel sounds Musculoskeletal:no cyanosis of digits and no clubbing  NEURO: alert & oriented x 3 with fluent speech, no focal motor/sensory deficits  LABORATORY DATA:  I have reviewed the data as listed   Chemistry      Component Value Date/Time   NA 139 01/21/2015 1423   NA 143 12/30/2014 0811   K 3.8 01/21/2015 1423   K 4.6 12/30/2014 0811   CL 105 01/21/2015 1423   CO2 25 01/21/2015 1423   CO2 28 12/30/2014 0811   BUN 19 01/21/2015 1423   BUN 24.0 12/30/2014 0811   CREATININE 0.73 01/21/2015 1423   CREATININE 0.8 12/30/2014 0811      Component Value Date/Time   CALCIUM 9.2 01/21/2015 1423   CALCIUM 9.7 12/30/2014 0811   ALKPHOS 84 12/30/2014 0811   ALKPHOS 80 01/17/2014 0713   AST 16 12/30/2014 0811   AST 21 01/17/2014 0713   ALT 15 12/30/2014 0811   ALT 22 01/17/2014 0713   BILITOT 0.52 12/30/2014 0811    BILITOT 0.3 01/17/2014 0713       Lab Results  Component Value Date   WBC 6.9 01/21/2015   HGB 11.8* 01/21/2015   HCT 36.5 01/21/2015   MCV 90.3 01/21/2015   PLT 297 01/21/2015   NEUTROABS 5.0 12/30/2014   ASSESSMENT & PLAN:  Right breast mastectomy 01/27/2015: 2.2 cm grade 2 invasive ductal carcinoma ER 100%, PR 0%, HER-2 negative, Ki-67 21%, margins negative, sentinel lymph nodes negative   Pathology counseling: I discussed the pathology report in great detail and provided her with a copy of this report. I recommended Oncotype DX testing to determine prognosis as well as prediction of benefit from chemotherapy.  Return to clinic in 3 weeks for follow-up of Oncotype DX score.   if she does not need chemotherapy she can start anti-estrogen therapy. I briefly discussed PALLAS clinical trial with the patient which involves combination of anastrozole with Palbociclib versus anastrozole alone. We will discuss this with her when she returns back with Oncotype DX result.  No orders of the defined types were placed in this encounter.   The patient has a good understanding of the overall plan. she agrees with it. She will call with  any problems that may develop before her next visit here.   Rulon Eisenmenger, MD

## 2015-02-02 NOTE — Telephone Encounter (Signed)
Appointments made and avs printed for patient °

## 2015-02-03 ENCOUNTER — Other Ambulatory Visit: Payer: Self-pay | Admitting: Cardiovascular Disease

## 2015-02-03 NOTE — Telephone Encounter (Signed)
Rx(s) sent to pharmacy electronically.  

## 2015-02-09 ENCOUNTER — Telehealth: Payer: Self-pay

## 2015-02-09 NOTE — Telephone Encounter (Signed)
order faxed to [facility].  Sent to scan.

## 2015-02-10 ENCOUNTER — Encounter: Payer: Self-pay | Admitting: *Deleted

## 2015-02-10 NOTE — Progress Notes (Signed)
Received Oncotype Dx score of 25.  Placed a copy in Dr. Geralyn Flash box and took one to HIM to scan.

## 2015-02-11 ENCOUNTER — Encounter (HOSPITAL_COMMUNITY): Payer: Self-pay

## 2015-02-18 ENCOUNTER — Other Ambulatory Visit: Payer: Self-pay | Admitting: Family Medicine

## 2015-02-18 ENCOUNTER — Other Ambulatory Visit (HOSPITAL_COMMUNITY)
Admission: RE | Admit: 2015-02-18 | Discharge: 2015-02-18 | Disposition: A | Discharge status: Home / Self Care | Payer: BLUE CROSS/BLUE SHIELD | Source: Ambulatory Visit | Attending: Family Medicine | Admitting: Family Medicine

## 2015-02-18 DIAGNOSIS — Z124 Encounter for screening for malignant neoplasm of cervix: Secondary | ICD-10-CM | POA: Diagnosis not present

## 2015-02-22 LAB — CYTOLOGY - PAP

## 2015-02-24 ENCOUNTER — Ambulatory Visit (HOSPITAL_BASED_OUTPATIENT_CLINIC_OR_DEPARTMENT_OTHER): Payer: BLUE CROSS/BLUE SHIELD | Admitting: Hematology and Oncology

## 2015-02-24 ENCOUNTER — Telehealth: Payer: Self-pay | Admitting: Hematology and Oncology

## 2015-02-24 ENCOUNTER — Other Ambulatory Visit: Payer: Self-pay

## 2015-02-24 VITALS — BP 140/62 | HR 64 | Temp 97.6°F | Resp 18 | Ht 69.0 in | Wt 242.7 lb

## 2015-02-24 DIAGNOSIS — C50411 Malignant neoplasm of upper-outer quadrant of right female breast: Secondary | ICD-10-CM

## 2015-02-24 MED ORDER — LIDOCAINE-PRILOCAINE 2.5-2.5 % EX CREA: TOPICAL_CREAM | CUTANEOUS | Status: DC

## 2015-02-24 MED ORDER — PROCHLORPERAZINE MALEATE 10 MG PO TABS: 10.0000 mg | ORAL_TABLET | Freq: Four times a day (QID) | ORAL | Status: DC | PRN

## 2015-02-24 MED ORDER — DEXAMETHASONE 2 MG PO TABS: 4.0000 mg | ORAL_TABLET | Freq: Two times a day (BID) | ORAL | Status: DC

## 2015-02-24 MED ORDER — ONDANSETRON HCL 8 MG PO TABS: 8.0000 mg | ORAL_TABLET | Freq: Two times a day (BID) | ORAL | Status: DC

## 2015-02-24 MED ORDER — LORAZEPAM 0.5 MG PO TABS: 0.5000 mg | ORAL_TABLET | Freq: Four times a day (QID) | ORAL | Status: DC | PRN

## 2015-02-24 NOTE — Telephone Encounter (Signed)
per pof ot [facility] pt appt-sent Mw email to sch trmt-gave pt copy of sch-pt aware of appts-Has MY CHART

## 2015-02-24 NOTE — Assessment & Plan Note (Signed)
Right breast mastectomy 01/27/2015: 2.2 cm grade 2 invasive ductal carcinoma ER 100%, PR 0%, HER-2 negative, Ki-67 21%, margins negative, sentinel lymph nodes negative Oncotype score 25; 16 % risk of recurrence with tamoxifen alone  Recommendation: Based on intermediate risk of recurrence from Oncotype DX, I recommended systemic chemotherapy with Taxotere and Cytoxan 4 cycles. I discussed the risks and benefits of chemotherapy including the risk of hair loss, nausea vomiting, allergy to Taxotere, cytopenias, fatigue, loss of taste, risk of infection. Patient will need Neulasta after chemotherapy.  Chemotherapy counseling: I answered all of her questions and concerns regarding chemotherapy.  If she chose not to do chemotherapy, she would go on to receive adjuvant antiestrogen therapy.

## 2015-02-24 NOTE — Progress Notes (Signed)
Patient Care Team: Jonathon Jordan, MD as PCP - General (Family Medicine) Alphonsa Overall, MD as Consulting Physician (General Surgery) Nicholas Lose, MD as Consulting Physician (Hematology and Oncology) Thea Silversmith, MD as Consulting Physician (Radiation Oncology) Mauro Kaufmann, RN as Registered Nurse Rockwell Germany, RN as Registered Nurse Holley Bouche, NP as Nurse Practitioner (Nurse Practitioner) Irene Limbo, MD as Consulting Physician (Plastic Surgery)  DIAGNOSIS: Breast cancer of upper-outer quadrant of right female breast   Staging form: Breast, AJCC 7th Edition     Clinical stage from 12/30/2014: Stage IIA (T2, N0, M0) - Unsigned     Pathologic stage from 02/02/2015: Stage IIA (T2, N0, cM0) - Signed by Enid Cutter, MD on 02/08/2015       Staging comments: Staged on final mastectomy specimen by Dr. Avis Epley.    SUMMARY OF ONCOLOGIC HISTORY:   Breast cancer of upper-outer quadrant of right female breast   12/17/2014 Initial Biopsy Right breast biopsy: Invasive ductal carcinoma with DCIS, right axillary lymph node benign   12/24/2014 Initial Biopsy Right breast core biopsy: At least atypical ductal hyperplasia with associated calcification but findings suspicious for DCIS   12/29/2014 Breast MRI 2.1 cm mass in the upper-outer quadrant of the right breast 2 possible satellite nodules associated with this mass. NME 2.2cm, together 5.1 cm   01/26/2015 Surgery Right breast lumpectomy: 2.2 cm invasive ductal carcinoma with DCIS, grade 2, sentinel lymph node negative, T2 N0 M0 stage II a, ER 100%, PR 0%, HER-2 negative, Ki-67 21%    CHIEF COMPLIANT: Follow-up to discuss Oncotype DX  INTERVAL HISTORY: Mariah Kelley is a 64 year old with above-mentioned history of right breast cancer who underwent lumpectomy and Oncotype DX testing and is here today to discuss the result. Her Oncotype DX came back as intermediate risk with a score of 25, 16% risk of recurrence with tamoxifen alone.  She reports no new complaints or concerns. She is accompanied by her daughter.  REVIEW OF SYSTEMS:   Constitutional: Denies fevers, chills or abnormal weight loss Eyes: Denies blurriness of vision Ears, nose, mouth, throat, and face: Denies mucositis or sore throat Respiratory: Denies cough, dyspnea or wheezes Cardiovascular: Denies palpitation, chest discomfort or lower extremity swelling Gastrointestinal:  Denies nausea, heartburn or change in bowel habits Skin: Denies abnormal skin rashes Lymphatics: Denies new lymphadenopathy or easy bruising Neurological:Denies numbness, tingling or new weaknesses Behavioral/Psych: Mood is stable, no new changes  Breast:  denies any pain or lumps or nodules in either breasts All other systems were reviewed with the patient and are negative.  I have reviewed the past medical history, past surgical history, social history and family history with the patient and they are unchanged from previous note.  ALLERGIES:  is allergic to codeine and prednisone.  MEDICATIONS:  Current Outpatient Prescriptions  Medication Sig Dispense Refill  . aspirin 81 MG chewable tablet Chew 81 mg by mouth daily.    [name] atorvastatin (LIPITOR) 80 MG tablet TAKE ONE TABLET BY MOUTH ONE TIME DAILY at 6 PM 30 tablet 11  . clopidogrel (PLAVIX) 75 MG tablet Take 1 tablet (75 mg total) by mouth daily. 90 tablet 3  . metoprolol (LOPRESSOR) 50 MG tablet TAKE ONE TABLET BY MOUTH TWICE DAILY  60 tablet 11  . sulfamethoxazole-trimethoprim (BACTRIM DS,SEPTRA DS) 800-160 MG per tablet   0  . Bisacodyl (DULCOLAX PO) Take 3 tablets by mouth as needed.    [name] dexamethasone (DECADRON) 2 MG tablet Take 2 tablets (4 mg total) by mouth  2 (two) times daily. Start the day before Taxotere. Then again the day after chemo for 3 days. 30 tablet 0  . lidocaine-prilocaine (EMLA) cream Apply to affected area once 30 g 3  . LORazepam (ATIVAN) 0.5 MG tablet Take 1 tablet (0.5 mg total) by mouth every 6 (six)  hours as needed (Nausea or vomiting). 30 tablet 0  . Magnesium 250 MG TABS Take 250 mg by mouth daily.    . Magnesium Hydroxide (MILK OF MAGNESIA PO) Take by mouth as needed.    . ondansetron (ZOFRAN) 8 MG tablet Take 1 tablet (8 mg total) by mouth 2 (two) times daily. Start the day after chemo for 3 days. Then take as needed for nausea or vomiting. 30 tablet 1  . oxyCODONE-acetaminophen (PERCOCET/ROXICET) 5-325 MG per tablet Take 1-2 tablets by mouth every 4 (four) hours as needed for moderate pain. (Patient not taking: Reported on 02/24/2015) 40 tablet 0  . prochlorperazine (COMPAZINE) 10 MG tablet Take 1 tablet (10 mg total) by mouth every 6 (six) hours as needed (Nausea or vomiting). 30 tablet 1  . Ticagrelor (BRILINTA) 90 MG TABS tablet Take 1 tablet (90 mg total) by mouth 2 (two) times daily. (Patient not taking: Reported on 02/24/2015) 60 tablet 11   No current facility-administered medications for this visit.   Facility-Administered Medications Ordered in Other Visits  Medication Dose Route Frequency Provider Last Rate Last Dose  . chlorhexidine (HIBICLENS) 4 % liquid 1 application  1 application Topical Once Alphonsa Overall, MD      . chlorhexidine (HIBICLENS) 4 % liquid 1 application  1 application Topical Once Alphonsa Overall, MD        PHYSICAL EXAMINATION: ECOG PERFORMANCE STATUS: 1 - Symptomatic but completely ambulatory  Filed Vitals:   02/24/15 0906  BP: 140/62  Pulse: 64  Temp: 97.6 F (36.4 C)  Resp: 18   Filed Weights   02/24/15 0906  Weight: 242 lb 11.2 oz (110.088 kg)    GENERAL:alert, no distress and comfortable SKIN: skin color, texture, turgor are normal, no rashes or significant lesions EYES: normal, Conjunctiva are pink and non-injected, sclera clear OROPHARYNX:no exudate, no erythema and lips, buccal mucosa, and tongue normal  NECK: supple, thyroid normal size, non-tender, without nodularity LYMPH:  no palpable lymphadenopathy in the cervical, axillary or  inguinal LUNGS: clear to auscultation and percussion with normal breathing effort HEART: regular rate & rhythm and no murmurs and no lower extremity edema ABDOMEN:abdomen soft, non-tender and normal bowel sounds Musculoskeletal:no cyanosis of digits and no clubbing  NEURO: alert & oriented x 3 with fluent speech, no focal motor/sensory deficits  LABORATORY DATA:  I have reviewed the data as listed   Chemistry      Component Value Date/Time   NA 139 01/21/2015 1423   NA 143 12/30/2014 0811   K 3.8 01/21/2015 1423   K 4.6 12/30/2014 0811   CL 105 01/21/2015 1423   CO2 25 01/21/2015 1423   CO2 28 12/30/2014 0811   BUN 19 01/21/2015 1423   BUN 24.0 12/30/2014 0811   CREATININE 0.73 01/21/2015 1423   CREATININE 0.8 12/30/2014 0811      Component Value Date/Time   CALCIUM 9.2 01/21/2015 1423   CALCIUM 9.7 12/30/2014 0811   ALKPHOS 84 12/30/2014 0811   ALKPHOS 80 01/17/2014 0713   AST 16 12/30/2014 0811   AST 21 01/17/2014 0713   ALT 15 12/30/2014 0811   ALT 22 01/17/2014 0713   BILITOT 0.52 12/30/2014 8937  BILITOT 0.3 01/17/2014 0713       Lab Results  Component Value Date   WBC 6.9 01/21/2015   HGB 11.8* 01/21/2015   HCT 36.5 01/21/2015   MCV 90.3 01/21/2015   PLT 297 01/21/2015   NEUTROABS 5.0 12/30/2014    ASSESSMENT & PLAN:  Breast cancer of upper-outer quadrant of right female breast Right breast mastectomy 01/27/2015: 2.2 cm grade 2 invasive ductal carcinoma ER 100%, PR 0%, HER-2 negative, Ki-67 21%, margins negative, sentinel lymph nodes negative Oncotype score 25; 16 % risk of recurrence with tamoxifen alone  Recommendation: Based on intermediate risk of recurrence from Oncotype DX, I recommended systemic chemotherapy with Taxotere and Cytoxan 4 cycles. I discussed the risks and benefits of chemotherapy including the risk of hair loss, nausea vomiting, allergy to Taxotere, cytopenias, fatigue, loss of taste, risk of infection. Patient will need Neulasta  after chemotherapy.  Chemotherapy counseling: I answered all of her questions and concerns regarding chemotherapy. After listening to all the risks and benefits, patient decided to undergo systemic chemotherapy. She will start treatment next week. I instructed her in detail regarding antiemetic regimen. She will start taking Decadron the day before chemotherapy.  Return to clinic on cycle 1 day 8 for toxicity check.   Orders Placed This Encounter  Procedures  . CBC with Differential    Standing Status: Standing     Number of Occurrences: 20     Standing Expiration Date: 02/25/2016  . Comprehensive metabolic panel    Standing Status: Standing     Number of Occurrences: 20     Standing Expiration Date: 02/25/2016  . 2D Echocardiogram without contrast    Standing Status: Future     Number of Occurrences:      Standing Expiration Date: 02/24/2016    Scheduling Instructions:     Needs to be completed no later than 03/02/15.  Patient starts chemotherapy 4/27.    Order Specific Question:  Type of Echo    Answer:  Complete    Order Specific Question:  Reason for Exam    Answer:  administration of cardiotoxic chemotherapy - baseline    Order Specific Question:  Where should this test be performed    Answer:  [facility]   The patient has a good understanding of the overall plan. she agrees with it. She will call with any problems that may develop before her next visit here.   Rulon Eisenmenger, MD

## 2015-02-25 ENCOUNTER — Other Ambulatory Visit: Payer: Self-pay | Admitting: *Deleted

## 2015-02-26 ENCOUNTER — Ambulatory Visit: Payer: BLUE CROSS/BLUE SHIELD

## 2015-03-03 ENCOUNTER — Other Ambulatory Visit (HOSPITAL_BASED_OUTPATIENT_CLINIC_OR_DEPARTMENT_OTHER): Payer: BLUE CROSS/BLUE SHIELD

## 2015-03-03 ENCOUNTER — Ambulatory Visit (HOSPITAL_BASED_OUTPATIENT_CLINIC_OR_DEPARTMENT_OTHER): Payer: BLUE CROSS/BLUE SHIELD

## 2015-03-03 ENCOUNTER — Encounter: Payer: Self-pay | Admitting: *Deleted

## 2015-03-03 ENCOUNTER — Encounter: Payer: Self-pay | Admitting: Hematology and Oncology

## 2015-03-03 ENCOUNTER — Telehealth: Payer: Self-pay | Admitting: *Deleted

## 2015-03-03 VITALS — BP 146/60 | HR 66 | Temp 97.8°F | Resp 20

## 2015-03-03 DIAGNOSIS — Z5111 Encounter for antineoplastic chemotherapy: Secondary | ICD-10-CM

## 2015-03-03 DIAGNOSIS — C50411 Malignant neoplasm of upper-outer quadrant of right female breast: Secondary | ICD-10-CM | POA: Diagnosis not present

## 2015-03-03 LAB — CBC WITH DIFFERENTIAL/PLATELET
BASO%: 0.1 % (ref 0.0–2.0)
BASOS ABS: 0 10*3/uL (ref 0.0–0.1)
EOS%: 0 % (ref 0.0–7.0)
Eosinophils Absolute: 0 10*3/uL (ref 0.0–0.5)
HEMATOCRIT: 38.1 % (ref 34.8–46.6)
HGB: 12.4 g/dL (ref 11.6–15.9)
LYMPH%: 10.9 % — ABNORMAL LOW (ref 14.0–49.7)
MCH: 29.4 pg (ref 25.1–34.0)
MCHC: 32.5 g/dL (ref 31.5–36.0)
MCV: 90.3 fL (ref 79.5–101.0)
MONO#: 1 10*3/uL — ABNORMAL HIGH (ref 0.1–0.9)
MONO%: 8.9 % (ref 0.0–14.0)
NEUT%: 80.1 % — AB (ref 38.4–76.8)
NEUTROS ABS: 8.7 10*3/uL — AB (ref 1.5–6.5)
Platelets: 319 10*3/uL (ref 145–400)
RBC: 4.22 10*6/uL (ref 3.70–5.45)
RDW: 12.8 % (ref 11.2–14.5)
WBC: 10.8 10*3/uL — ABNORMAL HIGH (ref 3.9–10.3)
lymph#: 1.2 10*3/uL (ref 0.9–3.3)

## 2015-03-03 LAB — COMPREHENSIVE METABOLIC PANEL (CC13)
ALT: 13 U/L (ref 0–55)
ANION GAP: 13 meq/L — AB (ref 3–11)
AST: 9 U/L (ref 5–34)
Albumin: 4 g/dL (ref 3.5–5.0)
Alkaline Phosphatase: 78 U/L (ref 40–150)
BUN: 25.4 mg/dL (ref 7.0–26.0)
CHLORIDE: 112 meq/L — AB (ref 98–109)
CO2: 20 mEq/L — ABNORMAL LOW (ref 22–29)
Calcium: 9.8 mg/dL (ref 8.4–10.4)
Creatinine: 0.8 mg/dL (ref 0.6–1.1)
EGFR: 81 mL/min/{1.73_m2} — ABNORMAL LOW (ref >90)
GLUCOSE: 154 mg/dL — AB (ref 70–140)
Potassium: 4.4 mEq/L (ref 3.5–5.1)
Sodium: 145 mEq/L (ref 136–145)
Total Bilirubin: 0.23 mg/dL (ref 0.20–1.20)
Total Protein: 7.1 g/dL (ref 6.4–8.3)

## 2015-03-03 MED ORDER — SODIUM CHLORIDE 0.9 % IV SOLN
600.0000 mg/m2 | Freq: Once | INTRAVENOUS | Status: AC
  Administered 2015-03-03: 1400 mg via INTRAVENOUS
  Filled 2015-03-03: qty 70

## 2015-03-03 MED ORDER — PALONOSETRON HCL INJECTION 0.25 MG/5ML
0.2500 mg | Freq: Once | INTRAVENOUS | Status: AC
  Administered 2015-03-03: 0.25 mg via INTRAVENOUS

## 2015-03-03 MED ORDER — PEGFILGRASTIM 6 MG/0.6ML ~~LOC~~ PSKT
6.0000 mg | PREFILLED_SYRINGE | Freq: Once | SUBCUTANEOUS | Status: AC
  Administered 2015-03-03: 6 mg via SUBCUTANEOUS
  Filled 2015-03-03: qty 0.6

## 2015-03-03 MED ORDER — PALONOSETRON HCL INJECTION 0.25 MG/5ML
INTRAVENOUS | Status: AC
  Filled 2015-03-03: qty 5

## 2015-03-03 MED ORDER — SODIUM CHLORIDE 0.9 % IV SOLN
Freq: Once | INTRAVENOUS | Status: AC
  Administered 2015-03-03: 11:00:00 via INTRAVENOUS

## 2015-03-03 MED ORDER — HEPARIN SOD (PORK) LOCK FLUSH 100 UNIT/ML IV SOLN
500.0000 [IU] | Freq: Once | INTRAVENOUS | Status: DC | PRN
  Filled 2015-03-03: qty 5

## 2015-03-03 MED ORDER — DOCETAXEL CHEMO INJECTION 160 MG/16ML
75.0000 mg/m2 | Freq: Once | INTRAVENOUS | Status: AC
  Administered 2015-03-03: 170 mg via INTRAVENOUS
  Filled 2015-03-03: qty 17

## 2015-03-03 MED ORDER — SODIUM CHLORIDE 0.9 % IV SOLN
Freq: Once | INTRAVENOUS | Status: AC
  Administered 2015-03-03: 11:00:00 via INTRAVENOUS
  Filled 2015-03-03: qty 1

## 2015-03-03 MED ORDER — SODIUM CHLORIDE 0.9 % IJ SOLN
10.0000 mL | INTRAMUSCULAR | Status: DC | PRN
  Filled 2015-03-03: qty 10

## 2015-03-03 NOTE — Telephone Encounter (Signed)
Spoke with Debbie at [facility] that patient will need port placement.

## 2015-03-03 NOTE — Progress Notes (Signed)
Enrolled pt in the Neulasta First Step program.  Faxed signed form and activated card today.  °

## 2015-03-03 NOTE — Progress Notes (Signed)
Met with pt during 1st chemotherapy treatment. Relate she is doing well. Denies questions at this time. Encourage pt to call with needs or concerns. Received verbal understanding.

## 2015-03-03 NOTE — Patient Instructions (Addendum)
[facility] Discharge Instructions for Patients Receiving Chemotherapy  Today you received the following chemotherapy agents Cytoxan and Taxotere.  To help prevent nausea and vomiting after your treatment, we encourage you to take your nausea medication as prescribed. If you develop nausea and vomiting that is not controlled by your nausea medication, call the clinic.   BELOW ARE SYMPTOMS THAT SHOULD BE REPORTED IMMEDIATELY:  *FEVER GREATER THAN 100.5 F  *CHILLS WITH OR WITHOUT FEVER  NAUSEA AND VOMITING THAT IS NOT CONTROLLED WITH YOUR NAUSEA MEDICATION  *UNUSUAL SHORTNESS OF BREATH  *UNUSUAL BRUISING OR BLEEDING  TENDERNESS IN MOUTH AND THROAT WITH OR WITHOUT PRESENCE OF ULCERS  *URINARY PROBLEMS  *BOWEL PROBLEMS  UNUSUAL RASH Items with * indicate a potential emergency and should be followed up as soon as possible.  Feel free to call the clinic you have any questions or concerns. The clinic phone number is [phone].  Please show the [facility] at check-in to the Emergency Department and triage nurse.  Nausea and Vomiting Nausea is a sick feeling that often comes before throwing up (vomiting). Vomiting is a reflex where stomach contents come out of your mouth. Vomiting can cause severe loss of body fluids (dehydration). Children and elderly adults can become dehydrated quickly, especially if they also have diarrhea. Nausea and vomiting are symptoms of a condition or disease. It is important to find the cause of your symptoms. CAUSES   Direct irritation of the stomach lining. This irritation can result from increased acid production (gastroesophageal reflux disease), infection, food poisoning, taking certain medicines (such as nonsteroidal anti-inflammatory drugs), alcohol use, or tobacco use.  Signals from the brain.These signals could be caused by a headache, heat exposure, an inner ear disturbance, increased pressure in the brain from injury,  infection, a tumor, or a concussion, pain, emotional stimulus, or metabolic problems.  An obstruction in the gastrointestinal tract (bowel obstruction).  Illnesses such as diabetes, hepatitis, gallbladder problems, appendicitis, kidney problems, cancer, sepsis, atypical symptoms of a heart attack, or eating disorders.  Medical treatments such as chemotherapy and radiation.  Receiving medicine that makes you sleep (general anesthetic) during surgery. DIAGNOSIS Your caregiver may ask for tests to be done if the problems do not improve after a few days. Tests may also be done if symptoms are severe or if the reason for the nausea and vomiting is not clear. Tests may include:  Urine tests.  Blood tests.  Stool tests.  Cultures (to look for evidence of infection).  X-rays or other imaging studies. Test results can help your caregiver make decisions about treatment or the need for additional tests. TREATMENT You need to stay well hydrated. Drink frequently but in small amounts.You may wish to drink water, sports drinks, clear broth, or eat frozen ice pops or gelatin dessert to help stay hydrated.When you eat, eating slowly may help prevent nausea.There are also some antinausea medicines that may help prevent nausea. HOME CARE INSTRUCTIONS   Take all medicine as directed by your caregiver.  If you do not have an appetite, do not force yourself to eat. However, you must continue to drink fluids.  If you have an appetite, eat a normal diet unless your caregiver tells you differently.  Eat a variety of complex carbohydrates (rice, wheat, potatoes, bread), lean meats, yogurt, fruits, and vegetables.  Avoid high-fat foods because they are more difficult to digest.  Drink enough water and fluids to keep your urine clear or pale yellow.  If you  are dehydrated, ask your caregiver for specific rehydration instructions. Signs of dehydration may include:  Severe thirst.  Dry lips and  mouth.  Dizziness.  Dark urine.  Decreasing urine frequency and amount.  Confusion.  Rapid breathing or pulse. SEEK IMMEDIATE MEDICAL CARE IF:   You have blood or brown flecks (like coffee grounds) in your vomit.  You have black or bloody stools.  You have a severe headache or stiff neck.  You are confused.  You have severe abdominal pain.  You have chest pain or trouble breathing.  You do not urinate at least once every 8 hours.  You develop cold or clammy skin.  You continue to vomit for longer than 24 to 48 hours.  You have a fever. MAKE SURE YOU:   Understand these instructions.  Will watch your condition.  Will get help right away if you are not doing well or get worse. Document Released: 10/23/2005 Document Revised: 01/15/2012 Document Reviewed: 03/22/2011 [facility] Patient Information 2015 [place], [place]. This information is not intended to replace advice given to you by your health care provider. Make sure you discuss any questions you have with your health care provider.

## 2015-03-04 ENCOUNTER — Telehealth: Payer: Self-pay | Admitting: *Deleted

## 2015-03-04 NOTE — Telephone Encounter (Signed)
Patient received 1st TC yesterday. Patient denies any nausea, vomiting or other problems. States she is eating and drinking without problems. Advised patient to call with any questions or concerns. She verbalized understanding.

## 2015-03-08 ENCOUNTER — Other Ambulatory Visit: Payer: Self-pay | Admitting: Surgery

## 2015-03-09 NOTE — Assessment & Plan Note (Signed)
Right breast mastectomy 01/27/2015: 2.2 cm grade 2 invasive ductal carcinoma ER 100%, PR 0%, HER-2 negative, Ki-67 21%, margins negative, sentinel lymph nodes negative Oncotype score 25; 16 % risk of recurrence with tamoxifen alone   Treatment plan: Taxotere Cytoxan every 3 weeks 4  Current treatment: Taxotere Cytoxan cycle 1 day 8   Chemotherapy toxicities:   monitoring closely for chemotherapy toxicities  Return to clinic in 2 weeks for cycle 2

## 2015-03-10 ENCOUNTER — Ambulatory Visit (HOSPITAL_BASED_OUTPATIENT_CLINIC_OR_DEPARTMENT_OTHER): Payer: BLUE CROSS/BLUE SHIELD | Admitting: Hematology and Oncology

## 2015-03-10 ENCOUNTER — Telehealth: Payer: Self-pay | Admitting: Hematology and Oncology

## 2015-03-10 ENCOUNTER — Other Ambulatory Visit (HOSPITAL_BASED_OUTPATIENT_CLINIC_OR_DEPARTMENT_OTHER): Payer: BLUE CROSS/BLUE SHIELD

## 2015-03-10 VITALS — BP 142/64 | HR 95 | Temp 98.2°F | Resp 18 | Ht 69.0 in | Wt 236.2 lb

## 2015-03-10 DIAGNOSIS — M899 Disorder of bone, unspecified: Secondary | ICD-10-CM | POA: Diagnosis not present

## 2015-03-10 DIAGNOSIS — F19982 Other psychoactive substance use, unspecified with psychoactive substance-induced sleep disorder: Secondary | ICD-10-CM

## 2015-03-10 DIAGNOSIS — B373 Candidiasis of vulva and vagina: Secondary | ICD-10-CM

## 2015-03-10 DIAGNOSIS — C50411 Malignant neoplasm of upper-outer quadrant of right female breast: Secondary | ICD-10-CM

## 2015-03-10 LAB — COMPREHENSIVE METABOLIC PANEL (CC13)
ALT: 25 U/L (ref 0–55)
AST: 17 U/L (ref 5–34)
Albumin: 3.3 g/dL — ABNORMAL LOW (ref 3.5–5.0)
Alkaline Phosphatase: 87 U/L (ref 40–150)
Anion Gap: 10 mEq/L (ref 3–11)
BUN: 14.4 mg/dL (ref 7.0–26.0)
CALCIUM: 9.4 mg/dL (ref 8.4–10.4)
CHLORIDE: 103 meq/L (ref 98–109)
CO2: 26 mEq/L (ref 22–29)
Creatinine: 0.8 mg/dL (ref 0.6–1.1)
EGFR: 76 mL/min/{1.73_m2} — ABNORMAL LOW (ref >90)
Glucose: 180 mg/dl — ABNORMAL HIGH (ref 70–140)
POTASSIUM: 4.3 meq/L (ref 3.5–5.1)
SODIUM: 139 meq/L (ref 136–145)
TOTAL PROTEIN: 6.5 g/dL (ref 6.4–8.3)
Total Bilirubin: 0.54 mg/dL (ref 0.20–1.20)

## 2015-03-10 LAB — CBC WITH DIFFERENTIAL/PLATELET
BASO%: 1 % (ref 0.0–2.0)
Basophils Absolute: 0.1 10*3/uL (ref 0.0–0.1)
EOS ABS: 0.2 10*3/uL (ref 0.0–0.5)
EOS%: 3 % (ref 0.0–7.0)
HCT: 38.9 % (ref 34.8–46.6)
HGB: 12.7 g/dL (ref 11.6–15.9)
LYMPH%: 23.8 % (ref 14.0–49.7)
MCH: 28.9 pg (ref 25.1–34.0)
MCHC: 32.6 g/dL (ref 31.5–36.0)
MCV: 88.4 fL (ref 79.5–101.0)
MONO#: 1.1 10*3/uL — ABNORMAL HIGH (ref 0.1–0.9)
MONO%: 14.5 % — AB (ref 0.0–14.0)
NEUT#: 4.5 10*3/uL (ref 1.5–6.5)
NEUT%: 57.7 % (ref 38.4–76.8)
PLATELETS: 246 10*3/uL (ref 145–400)
RBC: 4.4 10*6/uL (ref 3.70–5.45)
RDW: 12.6 % (ref 11.2–14.5)
WBC: 7.7 10*3/uL (ref 3.9–10.3)
lymph#: 1.8 10*3/uL (ref 0.9–3.3)

## 2015-03-10 MED ORDER — FLUCONAZOLE 100 MG PO TABS: 100.0000 mg | ORAL_TABLET | Freq: Every day | ORAL | Status: DC

## 2015-03-10 NOTE — Telephone Encounter (Signed)
Appointments made and avs printed for patient °

## 2015-03-10 NOTE — Progress Notes (Signed)
Patient Care Team: Jonathon Jordan, MD as PCP - General (Family Medicine) Alphonsa Overall, MD as Consulting Physician (General Surgery) Nicholas Lose, MD as Consulting Physician (Hematology and Oncology) Thea Silversmith, MD as Consulting Physician (Radiation Oncology) Mauro Kaufmann, RN as Registered Nurse Rockwell Germany, RN as Registered Nurse Holley Bouche, NP as Nurse Practitioner (Nurse Practitioner) Irene Limbo, MD as Consulting Physician (Plastic Surgery)  DIAGNOSIS: Breast cancer of upper-outer quadrant of right female breast   Staging form: Breast, AJCC 7th Edition     Clinical stage from 12/30/2014: Stage IIA (T2, N0, M0) - Unsigned     Pathologic stage from 02/02/2015: Stage IIA (T2, N0, cM0) - Signed by Enid Cutter, MD on 02/08/2015       Staging comments: Staged on final mastectomy specimen by Dr. Avis Epley.    SUMMARY OF ONCOLOGIC HISTORY:   Breast cancer of upper-outer quadrant of right female breast   12/17/2014 Initial Biopsy Right breast biopsy: Invasive ductal carcinoma with DCIS, right axillary lymph node benign   12/24/2014 Initial Biopsy Right breast core biopsy: At least atypical ductal hyperplasia with associated calcification but findings suspicious for DCIS   12/29/2014 Breast MRI 2.1 cm mass in the upper-outer quadrant of the right breast 2 possible satellite nodules associated with this mass. NME 2.2cm, together 5.1 cm   01/26/2015 Surgery Right breast lumpectomy: 2.2 cm invasive ductal carcinoma with DCIS, grade 2, sentinel lymph node negative, T2 N0 M0 stage II a, ER 100%, PR 0%, HER-2 negative, Ki-67 21%    CHIEF COMPLIANT: Cycle 1 day 8 of Taxotere Cytoxan  INTERVAL HISTORY: Mariah Kelley is a 64 year old with above-mentioned history of right breast cancer currently on adjuvant chemotherapy with Taxotere and Cytoxan today cycle 1 day 8. She had done well with the chemotherapy for the first 3 days but started getting muscle aches and bone pain that lasted  3-4 days. She did not take Tylenol or Claritin-D. She is starting to feel better today. She also complains of a vaginal redness and irritation from vaginal candidiasis. She also had difficulty with sleeping because of the steroids.  REVIEW OF SYSTEMS:   Constitutional: Denies fevers, chills or abnormal weight loss Eyes: Denies blurriness of vision Ears, nose, mouth, throat, and face: Denies mucositis or sore throat Respiratory: Denies cough, dyspnea or wheezes Cardiovascular: Denies palpitation, chest discomfort or lower extremity swelling Gastrointestinal:  Denies nausea, heartburn or change in bowel habits Skin: Denies abnormal skin rashes Lymphatics: Denies new lymphadenopathy or easy bruising Neurological:Denies numbness, tingling or new weaknesses Behavioral/Psych: Mood is stable, no new changes  Vaginal redness and urinary burning All other systems were reviewed with the patient and are negative.  I have reviewed the past medical history, past surgical history, social history and family history with the patient and they are unchanged from previous note.  ALLERGIES:  is allergic to codeine and prednisone.  MEDICATIONS:  Current Outpatient Prescriptions  Medication Sig Dispense Refill  . atorvastatin (LIPITOR) 80 MG tablet TAKE ONE TABLET BY MOUTH ONE TIME DAILY at 6 PM 30 tablet 11  . clopidogrel (PLAVIX) 75 MG tablet Take 1 tablet (75 mg total) by mouth daily. 90 tablet 3  . dexamethasone (DECADRON) 2 MG tablet Take 2 tablets (4 mg total) by mouth 2 (two) times daily. Start the day before Taxotere. Then again the day after chemo for 3 days. 30 tablet 0  . lidocaine-prilocaine (EMLA) cream     . LORazepam (ATIVAN) 0.5 MG tablet Take 1 tablet (0.5  mg total) by mouth every 6 (six) hours as needed (Nausea or vomiting). 30 tablet 0  . metoprolol (LOPRESSOR) 50 MG tablet TAKE ONE TABLET BY MOUTH TWICE DAILY  60 tablet 11  . nitroGLYCERIN (NITROSTAT) 0.6 MG SL tablet Place 0.6 mg under  the tongue every 5 (five) minutes as needed for chest pain.    [name] ondansetron (ZOFRAN) 8 MG tablet Take 1 tablet (8 mg total) by mouth 2 (two) times daily. Start the day after chemo for 3 days. Then take as needed for nausea or vomiting. 30 tablet 1  . prochlorperazine (COMPAZINE) 10 MG tablet Take 1 tablet (10 mg total) by mouth every 6 (six) hours as needed (Nausea or vomiting). 30 tablet 1  . Vitamin D, Ergocalciferol, (DRISDOL) 50000 UNITS CAPS capsule Take 50,000 Units by mouth every 7 (seven) days.    . fluconazole (DIFLUCAN) 100 MG tablet Take 1 tablet (100 mg total) by mouth daily. 10 tablet 0   No current facility-administered medications for this visit.    PHYSICAL EXAMINATION: ECOG PERFORMANCE STATUS: 1 - Symptomatic but completely ambulatory  Filed Vitals:   03/10/15 0829  BP: 142/64  Pulse: 95  Temp: 98.2 F (36.8 C)  Resp: 18   Filed Weights   03/10/15 0829  Weight: 236 lb 3.2 oz (107.14 kg)    GENERAL:alert, no distress and comfortable SKIN: skin color, texture, turgor are normal, no rashes or significant lesions EYES: normal, Conjunctiva are pink and non-injected, sclera clear OROPHARYNX:no exudate, no erythema and lips, buccal mucosa, and tongue normal  NECK: supple, thyroid normal size, non-tender, without nodularity LYMPH:  no palpable lymphadenopathy in the cervical, axillary or inguinal LUNGS: clear to auscultation and percussion with normal breathing effort HEART: regular rate & rhythm and no murmurs and no lower extremity edema ABDOMEN:abdomen soft, non-tender and normal bowel sounds Musculoskeletal:no cyanosis of digits and no clubbing  NEURO: alert & oriented x 3 with fluent speech, no focal motor/sensory deficits  LABORATORY DATA:  I have reviewed the data as listed   Chemistry      Component Value Date/Time   NA 139 03/10/2015 0817   NA 139 01/21/2015 1423   K 4.3 03/10/2015 0817   K 3.8 01/21/2015 1423   CL 105 01/21/2015 1423   CO2 26  03/10/2015 0817   CO2 25 01/21/2015 1423   BUN 14.4 03/10/2015 0817   BUN 19 01/21/2015 1423   CREATININE 0.8 03/10/2015 0817   CREATININE 0.73 01/21/2015 1423      Component Value Date/Time   CALCIUM 9.4 03/10/2015 0817   CALCIUM 9.2 01/21/2015 1423   ALKPHOS 87 03/10/2015 0817   ALKPHOS 80 01/17/2014 0713   AST 17 03/10/2015 0817   AST 21 01/17/2014 0713   ALT 25 03/10/2015 0817   ALT 22 01/17/2014 0713   BILITOT 0.54 03/10/2015 0817   BILITOT 0.3 01/17/2014 0713       Lab Results  Component Value Date   WBC 7.7 03/10/2015   HGB 12.7 03/10/2015   HCT 38.9 03/10/2015   MCV 88.4 03/10/2015   PLT 246 03/10/2015   NEUTROABS 4.5 03/10/2015   ASSESSMENT & PLAN:  Breast cancer of upper-outer quadrant of right female breast Right breast mastectomy 01/27/2015: 2.2 cm grade 2 invasive ductal carcinoma ER 100%, PR 0%, HER-2 negative, Ki-67 21%, margins negative, sentinel lymph nodes negative Oncotype score 25; 16 % risk of recurrence with tamoxifen alone   Treatment plan: Taxotere Cytoxan every 3 weeks 4  Current treatment:  Taxotere Cytoxan cycle 1 day 8   Chemotherapy toxicities: 1. Neulasta related bone pain: I instructed her take Claritin-D every day for 7 days with next chemotherapy 2. Vaginal candidiasis: Prescribed her Diflucan instructed her to hold Lipitor while she is on Diflucan because of interactions. 3. Difficulty sleeping due to steroids  For the next chemotherapy I would decrease the dosage of steroids.   monitoring closely for chemotherapy toxicities  Return to clinic in 2 weeks for cycle 2    No orders of the defined types were placed in this encounter.   The patient has a good understanding of the overall plan. she agrees with it. she will call with any problems that may develop before the next visit here.   Rulon Eisenmenger, MD

## 2015-03-11 ENCOUNTER — Other Ambulatory Visit: Payer: Self-pay | Admitting: Hematology and Oncology

## 2015-03-11 ENCOUNTER — Other Ambulatory Visit: Payer: Self-pay | Admitting: Cardiovascular Disease

## 2015-03-11 NOTE — Telephone Encounter (Signed)
Metoprolol tartrate refilled #60 with 11 refilled 02/03/2015

## 2015-03-16 NOTE — Progress Notes (Signed)
01/08/2015-EKG noted in EPIC. 03/10/2015-Labs-CBC w/ DIff., CMET noted in EPIC.

## 2015-03-16 NOTE — Patient Instructions (Addendum)
Mariah Kelley  03/16/2015   Your procedure is scheduled on: Friday 03/19/2015  Report to [facility] Main  Entrance and follow signs to               [place] at 0800 AM.  Call this number if you have problems the morning of surgery [phone]   Remember: ONLY 1 PERSON MAY GO WITH YOU TO SHORT STAY TO GET  READY MORNING OF [place].  Do not eat food or drink liquids :After Midnight.     Take these medicines the morning of surgery with A SIP OF WATER:    METOPROLOL                  MAY TAKE NITROGLYCERIN IF NEEDED             You may not have any metal on your body including hair pins and              piercings  Do not wear jewelry, make-up, lotions, powders or perfumes.             Do not wear nail polish.  Do not shave  48 hours prior to surgery.              Men may shave face and neck.   Do not bring valuables to the hospital. [place].  Contacts, dentures or bridgework may not be worn into surgery.  Leave suitcase in the car. After surgery it may be brought to your room.     Patients discharged the day of surgery will not be allowed to drive home.  Name and phone number of your driver:  [name]  Special Instructions: N/A              Please read over the following fact sheets you were given: _____________________________________________________________________             [facility] - Preparing for Surgery Before surgery, you can play an important role.  Because skin is not sterile, your skin needs to be as free of germs as possible.  You can reduce the number of germs on your skin by washing with CHG (chlorahexidine gluconate) soap before surgery.  CHG is an antiseptic cleaner which kills germs and bonds with the skin to continue killing germs even after washing. Please DO NOT use if you have an allergy to CHG or antibacterial soaps.  If your skin becomes reddened/irritated  stop using the CHG and inform your nurse when you arrive at Short Stay. Do not shave (including legs and underarms) for at least 48 hours prior to the first CHG shower.  You may shave your face/neck. Please follow these instructions carefully:  1.  Shower with CHG Soap the night before surgery and the  morning of Surgery.  2.  If you choose to wash your hair, wash your hair first as usual with your  normal  shampoo.  3.  After you shampoo, rinse your hair and body thoroughly to remove the  shampoo.                           4.  Use CHG as you would any other liquid soap.  You can apply chg directly  to the skin and wash                       Gently with a scrungie or clean washcloth.  5.  Apply the CHG Soap to your body ONLY FROM THE NECK DOWN.   Do not use on face/ open                           Wound or open sores. Avoid contact with eyes, ears mouth and genitals (private parts).                       Wash face,  Genitals (private parts) with your normal soap.             6.  Wash thoroughly, paying special attention to the area where your surgery  will be performed.  7.  Thoroughly rinse your body with warm water from the neck down.  8.  DO NOT shower/wash with your normal soap after using and rinsing off  the CHG Soap.                9.  Pat yourself dry with a clean towel.            10.  Wear clean pajamas.            11.  Place clean sheets on your bed the night of your first shower and do not  sleep with pets. Day of Surgery : Do not apply any lotions/deodorants the morning of surgery.  Please wear clean clothes to the hospital/surgery center.  FAILURE TO FOLLOW THESE INSTRUCTIONS MAY RESULT IN THE CANCELLATION OF YOUR SURGERY PATIENT SIGNATURE_________________________________  NURSE SIGNATURE__________________________________  ________________________________________________________________________

## 2015-03-18 ENCOUNTER — Encounter (HOSPITAL_COMMUNITY): Payer: Self-pay

## 2015-03-18 ENCOUNTER — Encounter (HOSPITAL_COMMUNITY): Payer: Self-pay | Admitting: *Deleted

## 2015-03-18 ENCOUNTER — Encounter (HOSPITAL_COMMUNITY)
Admission: RE | Admit: 2015-03-18 | Discharge: 2015-03-18 | Disposition: A | Discharge status: Home / Self Care | Payer: BLUE CROSS/BLUE SHIELD | Source: Ambulatory Visit | Attending: Surgery | Admitting: Surgery

## 2015-03-18 DIAGNOSIS — C50911 Malignant neoplasm of unspecified site of right female breast: Secondary | ICD-10-CM | POA: Insufficient documentation

## 2015-03-18 DIAGNOSIS — Z01812 Encounter for preprocedural laboratory examination: Secondary | ICD-10-CM | POA: Diagnosis not present

## 2015-03-18 LAB — BASIC METABOLIC PANEL
Anion gap: 8 (ref 5–15)
BUN: 20 mg/dL (ref 6–20)
CALCIUM: 9.7 mg/dL (ref 8.9–10.3)
CHLORIDE: 108 mmol/L (ref 101–111)
CO2: 29 mmol/L (ref 22–32)
CREATININE: 0.86 mg/dL (ref 0.44–1.00)
GFR calc Af Amer: >60 mL/min (ref >60)
Glucose, Bld: 106 mg/dL — ABNORMAL HIGH (ref 65–99)
Potassium: 4.4 mmol/L (ref 3.5–5.1)
Sodium: 145 mmol/L (ref 135–145)

## 2015-03-18 NOTE — Progress Notes (Signed)
OV Dr berry with clearance 01/08/15  epic

## 2015-03-18 NOTE — Progress Notes (Signed)
   03/18/15 1323  OBSTRUCTIVE SLEEP APNEA  Have you ever been diagnosed with sleep apnea through a sleep study? No  Do you snore loudly (loud enough to be heard through closed doors)?  0  Do you often feel tired, fatigued, or sleepy during the daytime? 0  Has anyone observed you stop breathing during your sleep? 0  Do you have, or are you being treated for high blood pressure? 1  BMI more than 35 kg/m2? 1  Age over 65 years old? 1  Neck circumference greater than 40 cm/16 inches? 1  Gender: 0

## 2015-03-18 NOTE — Progress Notes (Signed)
Cbc, cmp 03/10/15 epic.  Patient states was unaware she was to stop plavix and asa 5-7 days pre op and took todays dose.  Spoke with Abigail Butts in triage at [place] who stated Dr Lucia Gaskins was in the office, she would be sure he receives message and they will call patient.  Mariah Kelley informed of this

## 2015-03-22 NOTE — H&P (Signed)
Mariah Kelley 02/25/2015 9:49 AM Location: [facility] Patient #: [id number] DOB: 14-May-1951 Single / Language: Mariah Kelley / Race: White Female  History of Present Illness: The patient is a 64 year old female who presents with breast cancer. Her PCP is Dr. Jonathon Kelley She is seen in the Breast [facility] clinic - oncology - Mariah Kelley She is here with Mariah Kelley, a friend and she lives with her.  She underwent a right mastectomy and a left breast reduction (Dr. Iran Kelley) on 01/27/2015 - Path [id number] - Right breast - 2.2 cm IDC, Grade 2, separate focus of DCUS, 0/1 nodes, ER - 100%, PR - 0%, Left Breast with ALH.  Wounds are doing better. I removed her second drain. She had an oncotype of 25. She is going to get 4 courses of chemotx. He is going to treat her with IV. I'll see her back in 6 months.  Breast History: She had a biopsy in June 2015 which was benign. She was supposed to follow up in 6 months, but time got by. [facility] the [facility] when she felt a right breast lump. She has no fam history of breast cancer. She is not on hormone medication. Mammogram at [facility] on 12/11/2014 shows a 2.0 cm right breast round mass. On 12/17/2014 she ahd a biopsy of her right breast - [id number] - Invasive ductal ca, ER/PR positive, Her2Neu - neg, Ki67 - 20%, and benign lymph node. On 12/24/2014 she had a biopsy of the right breast - [id number] - at least ADH (maybe DCIS) MRI of breast 12/29/2014 - 1. 2.1 cm mass in the upper-outer quadrant of the right breast consistent with invasive ductal carcinoma and ductal carcinoma in situ a shown recent core biopsy. There 2 possible satellite nodules associated with this mass. See above. 2. Non mass enhancement medial and superior to this mass, also recently biopsied measuring approximately 2.2 cm. 3. Measured together the entire region of enhancement is 5.1 cm. Right breast cancer, UOQ Her cancer covers a potential 5.1 cm  - she wanted to go ahead with a mastectomy. She saw Dr. Iran Kelley for left breast reduction.  Past Medical History: 2. STEMI - 01/17/2014 Has a stent Followed by Dr. Quay Kelley 3. Obese 4. HTN 5. Quit smoking about 5 years ago 6. On Brilinta  SOCIAL and FAMILY HISTORY: Divorced She is here with Mariah Kelley, a friend and she lives with her. 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 Has 3 children - son 67, son 48, and daughter 47   Addendum Note(Mariah Kelley H. Mariah Mayers MD; 03/03/2015 3:11 PM) Her Oncotype DX came back as intermediate risk with a score of 25, 16% risk of recurrence with tamoxifen alone.  He is to start chemotherapy on 5/18.   Needs port.  DN 03/03/15  Problem List/Past Medical  BREAST CANCER, STAGE 2, RIGHT (174.9  C50.911) On 12/17/2014 she ahd a biopsy of her right breast - [id number] - Invasive ductal ca, ER/PR positive, Her2Neu - neg, Ki67 - 20%, and benign lymph node. On 12/24/2014 she had a biopsy of the right breast - [id number] - at least ADH (maybe DCIS) She underwent a right mastectomy and a left breast reduction on 01/27/2015 - Path [id number] - Right breast - 2.2 cm IDC, Grade 2, separate focus of DCUS, 0/1 nodes, ER - 100%, PR - 0%, Left Breast with ALH Seen at Breast Mariah Kelley clinic - Mariah Kelley  Other Problems  Arthritis Back Pain  Bladder Problems Breast Cancer Cervical Cancer Cholelithiasis Hemorrhoids High blood pressure Hypercholesterolemia Migraine Headache Myocardial infarction Oophorectomy Right.  Past Surgical History  Appendectomy Breast Biopsy Right. multiple Gallbladder Surgery - Laparoscopic Hysterectomy (due to cancer) - Partial Oral Surgery Spinal Surgery - Lower Back  Diagnostic Studies History  Colonoscopy >10 years ago Mammogram within last year Pap Smear 1-5 years ago  Allergies (Mariah Eversole, LPN; 3/66/2947 6:54 AM) Codeine  Phosphate *ANALGESICS - OPIOID* PredniSONE *CORTICOSTEROIDS*  Medication History (Mariah Eversole, LPN; 6/50/3546 5:68 AM) Aspirin EC (81MG Tablet DR, Oral) Active. Lipitor (80MG Tablet, Oral) Active. Plavix (75MG Tablet, Oral) Active. Magnesium Gluconate (250MG Tablet, Oral) Active. Lopressor (50MG Tablet, Oral) Active. Docusate Sodium (100MG Capsule, Oral) Active. Vitamin D (Cholecalciferol) (1000UNIT Tablet, Oral) Active.  Social History Mariah Medal, MD; 02/25/2015 10:25 AM) Alcohol use Occasional alcohol use. Caffeine use Tea. No drug use Tobacco use Former smoker.  Family History Mariah Medal, MD; 02/25/2015 10:25 AM) Arthritis Father. Cerebrovascular Accident Family Members In General. Diabetes Mellitus Family Members In General. Heart Disease Brother, Family Members In General, Father, Mother. Heart disease in female family member before age 77 Heart disease in female family member before age 33 Hypertension Brother, Mother. Migraine Headache Mother.  Pregnancy / Birth History Mariah Medal, MD; 02/25/2015 10:25 AM) Age at menarche 73 years. Age of menopause 51-50 Gravida 3 Maternal age 23-30 Para 3  Review of Systems Mariah Kelley. Mariah Gaskins MD; 02/25/2015 10:25 AM) General Present- Weight Loss. Not Present- Appetite Loss, Chills, Fatigue, Fever, Night Sweats and Weight Gain. Skin Present- Dryness, New Lesions and Rash. Not Present- Change in Wart/Mole, Hives, Jaundice, Non-Healing Wounds and Ulcer. HEENT Present- Wears glasses/contact lenses. Not Present- Earache, Hearing Loss, Hoarseness, Nose Bleed, Oral Ulcers, Ringing in the Ears, Seasonal Allergies, Sinus Pain, Sore Throat, Visual Disturbances and Yellow Eyes. Respiratory Present- Chronic Cough and Snoring. Not Present- Bloody sputum, Difficulty Breathing and Wheezing. Breast Present- Breast Mass and Skin Changes. Not Present- Breast Pain and Nipple Discharge. Cardiovascular Not Present-  Chest Pain, Difficulty Breathing Lying Down, Leg Cramps, Palpitations, Rapid Heart Rate, Shortness of Breath and Swelling of Extremities. Gastrointestinal Present- Hemorrhoids. Not Present- Abdominal Pain, Bloating, Bloody Stool, Change in Bowel Habits, Chronic diarrhea, Constipation, Difficulty Swallowing, Excessive gas, Gets full quickly at meals, Indigestion, Nausea, Rectal Pain and Vomiting. Female Genitourinary Not Present- Frequency, Nocturia, Painful Urination, Pelvic Pain and Urgency. Musculoskeletal Present- Back Pain and Joint Pain. Not Present- Joint Stiffness, Muscle Pain, Muscle Weakness and Swelling of Extremities. Neurological Present- Numbness. Not Present- Decreased Memory, Fainting, Headaches, Seizures, Tingling, Tremor, Trouble walking and Weakness. Psychiatric Not Present- Anxiety, Bipolar, Change in Sleep Pattern, Depression, Fearful and Frequent crying. Endocrine Not Present- Cold Intolerance, Excessive Hunger, Hair Changes, Heat Intolerance, Hot flashes and New Diabetes. Hematology Present- Easy Bruising. Not Present- Excessive bleeding, Gland problems, HIV and Persistent Infections.   Vitals (Mariah Eversole LPN; 12/02/5168 0:17 AM) 02/25/2015 9:49 AM Weight: 244 lb Height: 69in Body Surface Area: 2.32 m Body Mass Index: 36.03 kg/m Pulse: 82 (Regular)  BP: 162/70 (Sitting, Left Arm, Standard)  Physical Exam  GENERAL: WN WF who is alert and healthy appearing. HEENT: Pupils equal. Dentition good. NECK: Supple. No thyroid mass.  LYMPH NODES: No cervical, supraclavicular, or axillary adenopathy.  BREASTS - RIGHT: her wound looks better on the right. She has some small areas medial and lateral that are healing. I removed her second drain. It drained less than 30 cc for the last 4 days. LEFT: Reduction mammoplasty looks okay.  Some redness to the left breast.  UPPER EXTREMITIES: No evidence of lymphedema.  Assessment & Plan: 1.  BREAST CANCER, STAGE 2,  RIGHT (174.9  C50.911)  Story: On 12/17/2014 she ahd a biopsy of her right breast - [id number] - Invasive ductal ca, ER/PR positive, Her2Neu - neg, Ki67 - 20%, and benign lymph node.  On 12/24/2014 she had a biopsy of the right breast - [id number] - at least ADH (maybe DCIS)   She underwent a right mastectomy and a left breast reduction on 01/27/2015 -  Path [id number] - Right breast - 2.2 cm IDC, Grade 2, separate focus of DCUS, 0/1 nodes, ER - 100%, PR - 0%, Left Breast with ALH   Seen at Breast [facility] clinic - Mariah Kelley   Current Plans - Follow up in 6 months or as needed  2.  Plans chemotherapy - to start on 03/24/2015  For power port placement - Power port placement  I discussed the indications and potential complications of the power port placement.  The primary complications of the power port, include, but are not limited to, bleeding, infection, nerve injury, thrombosis, and pneumothorax.   Alphonsa Overall, MD, [facility] Pager: [phone] Office phone:  [phone]

## 2015-03-23 ENCOUNTER — Encounter (HOSPITAL_COMMUNITY): Payer: Self-pay | Admitting: *Deleted

## 2015-03-23 ENCOUNTER — Ambulatory Visit (HOSPITAL_COMMUNITY)
Admission: RE | Admit: 2015-03-23 | Discharge: 2015-03-23 | Disposition: A | Discharge status: Home / Self Care | Payer: BLUE CROSS/BLUE SHIELD | Source: Ambulatory Visit | Attending: Surgery | Admitting: Surgery

## 2015-03-23 ENCOUNTER — Ambulatory Visit (HOSPITAL_COMMUNITY): Payer: BLUE CROSS/BLUE SHIELD | Admitting: Anesthesiology

## 2015-03-23 ENCOUNTER — Ambulatory Visit (HOSPITAL_COMMUNITY): Payer: BLUE CROSS/BLUE SHIELD

## 2015-03-23 ENCOUNTER — Encounter (HOSPITAL_COMMUNITY)
Admission: RE | Disposition: A | Discharge status: Home / Self Care | Payer: Self-pay | Source: Ambulatory Visit | Attending: Surgery

## 2015-03-23 DIAGNOSIS — Z87891 Personal history of nicotine dependence: Secondary | ICD-10-CM | POA: Diagnosis not present

## 2015-03-23 DIAGNOSIS — Z9011 Acquired absence of right breast and nipple: Secondary | ICD-10-CM | POA: Diagnosis not present

## 2015-03-23 DIAGNOSIS — C50911 Malignant neoplasm of unspecified site of right female breast: Secondary | ICD-10-CM

## 2015-03-23 DIAGNOSIS — G43909 Migraine, unspecified, not intractable, without status migrainosus: Secondary | ICD-10-CM | POA: Insufficient documentation

## 2015-03-23 DIAGNOSIS — Z7902 Long term (current) use of antithrombotics/antiplatelets: Secondary | ICD-10-CM | POA: Insufficient documentation

## 2015-03-23 DIAGNOSIS — I2510 Atherosclerotic heart disease of native coronary artery without angina pectoris: Secondary | ICD-10-CM | POA: Diagnosis not present

## 2015-03-23 DIAGNOSIS — I252 Old myocardial infarction: Secondary | ICD-10-CM | POA: Insufficient documentation

## 2015-03-23 DIAGNOSIS — M1990 Unspecified osteoarthritis, unspecified site: Secondary | ICD-10-CM | POA: Diagnosis not present

## 2015-03-23 DIAGNOSIS — Z955 Presence of coronary angioplasty implant and graft: Secondary | ICD-10-CM | POA: Diagnosis not present

## 2015-03-23 DIAGNOSIS — E669 Obesity, unspecified: Secondary | ICD-10-CM | POA: Diagnosis not present

## 2015-03-23 DIAGNOSIS — Z6835 Body mass index (BMI) 35.0-35.9, adult: Secondary | ICD-10-CM | POA: Diagnosis not present

## 2015-03-23 DIAGNOSIS — I10 Essential (primary) hypertension: Secondary | ICD-10-CM | POA: Diagnosis not present

## 2015-03-23 DIAGNOSIS — Z8541 Personal history of malignant neoplasm of cervix uteri: Secondary | ICD-10-CM | POA: Insufficient documentation

## 2015-03-23 DIAGNOSIS — Z79899 Other long term (current) drug therapy: Secondary | ICD-10-CM | POA: Diagnosis not present

## 2015-03-23 DIAGNOSIS — E119 Type 2 diabetes mellitus without complications: Secondary | ICD-10-CM | POA: Insufficient documentation

## 2015-03-23 DIAGNOSIS — E780 Pure hypercholesterolemia: Secondary | ICD-10-CM | POA: Diagnosis not present

## 2015-03-23 DIAGNOSIS — Z170 Estrogen receptor positive status [ER+]: Secondary | ICD-10-CM | POA: Diagnosis not present

## 2015-03-23 DIAGNOSIS — Z7982 Long term (current) use of aspirin: Secondary | ICD-10-CM | POA: Insufficient documentation

## 2015-03-23 DIAGNOSIS — C50411 Malignant neoplasm of upper-outer quadrant of right female breast: Secondary | ICD-10-CM | POA: Insufficient documentation

## 2015-03-23 HISTORY — PX: PORTACATH PLACEMENT: SHX2246

## 2015-03-23 SURGERY — INSERTION, TUNNELED CENTRAL VENOUS DEVICE, WITH PORT
Anesthesia: General | Site: Chest | Laterality: Left

## 2015-03-23 MED ORDER — HYDROCODONE-ACETAMINOPHEN 5-325 MG PO TABS: 1.0000 | ORAL_TABLET | Freq: Four times a day (QID) | ORAL | Status: DC | PRN

## 2015-03-23 MED ORDER — LIDOCAINE HCL 1 % IJ SOLN
INTRAMUSCULAR | Status: AC
  Filled 2015-03-23: qty 20

## 2015-03-23 MED ORDER — FENTANYL CITRATE (PF) 250 MCG/5ML IJ SOLN
INTRAMUSCULAR | Status: DC | PRN
  Administered 2015-03-23: 100 ug via INTRAVENOUS
  Administered 2015-03-23: 25 ug via INTRAVENOUS

## 2015-03-23 MED ORDER — BUPIVACAINE-EPINEPHRINE (PF) 0.25% -1:200000 IJ SOLN
INTRAMUSCULAR | Status: AC
  Filled 2015-03-23: qty 30

## 2015-03-23 MED ORDER — HEPARIN SOD (PORK) LOCK FLUSH 100 UNIT/ML IV SOLN
INTRAVENOUS | Status: AC
  Filled 2015-03-23: qty 5

## 2015-03-23 MED ORDER — DEXAMETHASONE SODIUM PHOSPHATE 10 MG/ML IJ SOLN
INTRAMUSCULAR | Status: DC | PRN
  Administered 2015-03-23: 10 mg via INTRAVENOUS

## 2015-03-23 MED ORDER — ONDANSETRON HCL 4 MG/2ML IJ SOLN
INTRAMUSCULAR | Status: AC
  Filled 2015-03-23: qty 2

## 2015-03-23 MED ORDER — DEXAMETHASONE SODIUM PHOSPHATE 10 MG/ML IJ SOLN
INTRAMUSCULAR | Status: AC
  Filled 2015-03-23: qty 1

## 2015-03-23 MED ORDER — HEPARIN SOD (PORK) LOCK FLUSH 100 UNIT/ML IV SOLN
INTRAVENOUS | Status: DC | PRN
  Administered 2015-03-23: 500 [IU] via INTRAVENOUS

## 2015-03-23 MED ORDER — SODIUM CHLORIDE 0.9 % IR SOLN
Status: DC | PRN
Start: 2015-03-23 — End: 2015-03-23
  Administered 2015-03-23: 1000 mL

## 2015-03-23 MED ORDER — MIDAZOLAM HCL 2 MG/2ML IJ SOLN
INTRAMUSCULAR | Status: AC
  Filled 2015-03-23: qty 2

## 2015-03-23 MED ORDER — FENTANYL CITRATE (PF) 100 MCG/2ML IJ SOLN
INTRAMUSCULAR | Status: AC
  Filled 2015-03-23: qty 2

## 2015-03-23 MED ORDER — MIDAZOLAM HCL 5 MG/5ML IJ SOLN
INTRAMUSCULAR | Status: DC | PRN
  Administered 2015-03-23: 2 mg via INTRAVENOUS

## 2015-03-23 MED ORDER — CEFAZOLIN SODIUM-DEXTROSE 2-3 GM-% IV SOLR
2.0000 g | INTRAVENOUS | Status: AC
  Administered 2015-03-23: 2 g via INTRAVENOUS

## 2015-03-23 MED ORDER — LIDOCAINE HCL (PF) 1 % IJ SOLN
INTRAMUSCULAR | Status: DC | PRN
  Administered 2015-03-23: 13 mL

## 2015-03-23 MED ORDER — CEFAZOLIN SODIUM-DEXTROSE 2-3 GM-% IV SOLR
INTRAVENOUS | Status: AC
  Filled 2015-03-23: qty 50

## 2015-03-23 MED ORDER — LACTATED RINGERS IV SOLN
INTRAVENOUS | Status: DC | PRN
  Administered 2015-03-23: 09:00:00 via INTRAVENOUS

## 2015-03-23 MED ORDER — SODIUM CHLORIDE 0.9 % IR SOLN
Freq: Once | Status: AC
  Administered 2015-03-23: 500 mL
  Filled 2015-03-23: qty 1.2

## 2015-03-23 MED ORDER — PROPOFOL 10 MG/ML IV BOLUS
INTRAVENOUS | Status: DC | PRN
  Administered 2015-03-23: 100 mg via INTRAVENOUS
  Administered 2015-03-23: 50 mg via INTRAVENOUS

## 2015-03-23 MED ORDER — ONDANSETRON HCL 4 MG/2ML IJ SOLN
INTRAMUSCULAR | Status: DC | PRN
  Administered 2015-03-23: 4 mg via INTRAVENOUS

## 2015-03-23 MED ORDER — CLOPIDOGREL BISULFATE 75 MG PO TABS: 75.0000 mg | ORAL_TABLET | Freq: Every day | ORAL | Status: DC

## 2015-03-23 MED ORDER — PROPOFOL 10 MG/ML IV BOLUS
INTRAVENOUS | Status: AC
  Filled 2015-03-23: qty 20

## 2015-03-23 SURGICAL SUPPLY — 32 items
APL SKNCLS STERI-STRIP NONHPOA (GAUZE/BANDAGES/DRESSINGS) ×1
BAG DECANTER FOR FLEXI CONT (MISCELLANEOUS) ×3 IMPLANT
BENZOIN TINCTURE PRP APPL 2/3 (GAUZE/BANDAGES/DRESSINGS) ×3 IMPLANT
BLADE HEX COATED 2.75 (ELECTRODE) ×3 IMPLANT
BLADE SURG 15 STRL LF DISP TIS (BLADE) ×1 IMPLANT
BLADE SURG 15 STRL SS (BLADE) ×3
CHLORAPREP W/TINT 26ML (MISCELLANEOUS) ×3 IMPLANT
CLOSURE WOUND 1/4X4 (GAUZE/BANDAGES/DRESSINGS) ×1
DECANTER SPIKE VIAL GLASS SM (MISCELLANEOUS) ×3 IMPLANT
DRAPE C-ARM 42X120 X-RAY (DRAPES) ×3 IMPLANT
DRAPE LAPAROTOMY TRNSV 102X78 (DRAPE) ×3 IMPLANT
ELECT REM PT RETURN 9FT ADLT (ELECTROSURGICAL) ×3
ELECTRODE REM PT RTRN 9FT ADLT (ELECTROSURGICAL) ×1 IMPLANT
GAUZE SPONGE 2X2 8PLY STRL LF (GAUZE/BANDAGES/DRESSINGS) ×1 IMPLANT
GAUZE SPONGE 4X4 12PLY STRL (GAUZE/BANDAGES/DRESSINGS) ×3 IMPLANT
GAUZE SPONGE 4X4 16PLY XRAY LF (GAUZE/BANDAGES/DRESSINGS) ×3 IMPLANT
GLOVE SURG SIGNA 7.5 PF LTX (GLOVE) ×3 IMPLANT
GOWN STRL REUS W/TWL XL LVL3 (GOWN DISPOSABLE) ×6 IMPLANT
KIT BASIN OR (CUSTOM PROCEDURE TRAY) ×3 IMPLANT
KIT PORT POWER 8FR ISP CVUE (Catheter) ×2 IMPLANT
LIQUID BAND (GAUZE/BANDAGES/DRESSINGS) ×2 IMPLANT
NEEDLE HYPO 25X1 1.5 SAFETY (NEEDLE) ×3 IMPLANT
PACK BASIC VI WITH GOWN DISP (CUSTOM PROCEDURE TRAY) ×3 IMPLANT
PENCIL BUTTON HOLSTER BLD 10FT (ELECTRODE) ×3 IMPLANT
SPONGE GAUZE 2X2 STER 10/PKG (GAUZE/BANDAGES/DRESSINGS) ×2
STRIP CLOSURE SKIN 1/4X4 (GAUZE/BANDAGES/DRESSINGS) ×2 IMPLANT
SUT MNCRL AB 4-0 PS2 18 (SUTURE) ×3 IMPLANT
SUT VIC AB 3-0 SH 18 (SUTURE) ×3 IMPLANT
SYR 20CC LL (SYRINGE) ×3 IMPLANT
SYRINGE 10CC LL (SYRINGE) ×3 IMPLANT
TAPE CLOTH SURG 4X10 WHT LF (GAUZE/BANDAGES/DRESSINGS) ×2 IMPLANT
TOWEL OR 17X26 10 PK STRL BLUE (TOWEL DISPOSABLE) ×3 IMPLANT

## 2015-03-23 NOTE — Anesthesia Preprocedure Evaluation (Addendum)
Anesthesia Evaluation  Patient identified by MRN, date of birth, ID band Patient awake    Reviewed: Allergy & Precautions, NPO status , Patient's Chart, lab work & pertinent test results  History of Anesthesia Complications Negative for: history of anesthetic complications  Airway Mallampati: III  TM Distance: >3 FB Neck ROM: Full    Dental no notable dental hx. (+) Dental Advisory Given   Pulmonary former smoker,  breath sounds clear to auscultation  Pulmonary exam normal       Cardiovascular hypertension, + CAD and + Past MI Normal cardiovascular examRhythm:Regular Rate:Normal     Neuro/Psych negative neurological ROS  negative psych ROS   GI/Hepatic negative GI ROS, Neg liver ROS,   Endo/Other  diabetes  Renal/GU negative Renal ROS  negative genitourinary   Musculoskeletal  (+) Arthritis -, Osteoarthritis,    Abdominal   Peds negative pediatric ROS (+)  Hematology negative hematology ROS (+)   Anesthesia Other Findings   Reproductive/Obstetrics negative OB ROS                            Anesthesia Physical Anesthesia Plan  ASA: III  Anesthesia Plan: General   Post-op Pain Management:    Induction: Intravenous  Airway Management Planned: Oral ETT  Additional Equipment:   Intra-op Plan:   Post-operative Plan: Extubation in OR  Informed Consent: I have reviewed the patients History and Physical, chart, labs and discussed the procedure including the risks, benefits and alternatives for the proposed anesthesia with the patient or authorized representative who has indicated his/her understanding and acceptance.   Dental advisory given  Plan Discussed with: CRNA  Anesthesia Plan Comments:         Anesthesia Quick Evaluation

## 2015-03-23 NOTE — Transfer of Care (Signed)
Immediate Anesthesia Transfer of Care Note  Patient: Mariah Kelley  Procedure(s) Performed: Procedure(s): INSERTION PORT-A-CATH  (Left)  Patient Location: PACU  Anesthesia Type:General  Level of Consciousness: awake, alert , oriented and patient cooperative  Airway & Oxygen Therapy: Patient Spontanous Breathing and Patient connected to face mask oxygen  Post-op Assessment: Report given to RN and Post -op Vital signs reviewed and stable  Post vital signs: Reviewed and stable  Last Vitals:  Filed Vitals:   03/23/15 0842  BP: 155/83  Pulse: 72  Temp: 36.6 C  Resp: 16    Complications: No apparent anesthesia complications

## 2015-03-23 NOTE — Assessment & Plan Note (Signed)
Right breast mastectomy 01/27/2015: 2.2 cm grade 2 invasive ductal carcinoma ER 100%, PR 0%, HER-2 negative, Ki-67 21%, margins negative, sentinel lymph nodes negative Oncotype score 25; 16 % risk of recurrence with tamoxifen alone  Treatment plan: Taxotere Cytoxan every 3 weeks 4 Current treatment: Taxotere Cytoxan cycle 2 day 1  Chemotherapy toxicities: 1. Neulasta related bone pain: I instructed her take Claritin-D every day for 7 days with next chemotherapy 2. Vaginal candidiasis: Prescribed her Diflucan instructed her to hold Lipitor while she is on Diflucan because of interactions. 3. Difficulty sleeping due to steroids  For the next chemotherapy I would decrease the dosage of steroids.  monitoring closely for chemotherapy toxicities Return to clinic in 3 weeks for cycle 3

## 2015-03-23 NOTE — Anesthesia Procedure Notes (Signed)
Procedure Name: LMA Insertion Date/Time: 03/23/2015 10:13 AM Performed by: Dione Booze Pre-anesthesia Checklist: Patient identified, Emergency Drugs available, Suction available and Patient being monitored Patient Re-evaluated:Patient Re-evaluated prior to inductionOxygen Delivery Method: Circle system utilized Preoxygenation: Pre-oxygenation with 100% oxygen Intubation Type: IV induction LMA: LMA inserted LMA Size: 4.0 Number of attempts: 1 Placement Confirmation: positive ETCO2 and breath sounds checked- equal and bilateral Tube secured with: Tape Dental Injury: Teeth and Oropharynx as per pre-operative assessment

## 2015-03-23 NOTE — Discharge Instructions (Signed)
[facility] [facility] - DISCHARGE INSTRUCTIONS TO PATIENT  Activity:  Driving - May drive in 1 to 3 days, if feeling well   Lifting - Take it easy for 5 days, then no limit  Wound Care:   The port is accessed.  They should remove the needle after treatment on Wednesday.         You may shower Thursday night and get the incision wet.  Diet:  As tolerated  Follow up appointment:  Call Dr. Pollie Friar office [facility]) at [phone] for an appointment in 2 to 4 weeks.  Medications and dosages:  Resume your home medications.  You have a prescription for:  Vicodin            You may restart your Plavix on Thursday.            You've been given decadron today.  Take your doses tomorrow.   Call Dr. Lucia Gaskins or his office  [phone]) if you have:  Temperature greater than 100.4,  Persistent nausea and vomiting,  Severe uncontrolled pain,  Redness, tenderness, or signs of infection (pain, swelling, redness, odor or green/yellow discharge around the site),  Difficulty breathing, headache or visual disturbances,  Any other questions or concerns you may have after discharge.  In an emergency, call 911 or go to an Emergency Department at a nearby hospital.  Per dr Lucia Gaskins, restart plavix on Thursday 05.19.2016

## 2015-03-23 NOTE — Op Note (Signed)
03/23/2015  11:06 AM  PATIENT:  Mariah Kelley, 64 y.o., female MRN: [id number] DOB: [birth year]  PREOP DIAGNOSIS:  right brest cancer, receiving chemotherapy  POSTOP DIAGNOSIS:   right brest cancer, receiving chemotherapy  PROCEDURE:   Procedure(s): INSERTION PORT-A-CATH - Clear Vue Power Port  SURGEON:   Alphonsa Overall, M.D.  ANESTHESIA:   general  Anesthesiologist: Nolon Nations, MD; Lauretta Grill, MD CRNA: Dione Booze, CRNA  General  EBL:  minimal  ml  COUNTS CORRECT:  YES  INDICATIONS FOR PROCEDURE:  [name] is a 64 y.o. (DOB: [birth year]) white female whose primary care physician is [name], MD and comes for power port placement for the treatment of right breast cancer.  Dr. Lindi Adie is her treating oncologist.   The indications and risks of the surgery were explained to the patient.  The risks include, but are not limited to, infection, bleeding, pneumothorax, nerve injury, and thrombosis of the vein.  OPERATIVE NOTE:  The patient was taken to Room #1 at [facility].  Anesthesia was provided by Anesthesiologist: Nolon Nations, MD; Lauretta Grill, MD CRNA: Dione Booze, CRNA.  At the beginning of the operation, the patient was given 2 gm Ancef, had a roll placed under her back, and had the upper chest/neck prepped with Chloroprep and draped.   A time out was held and the surgery checklist reviewed.   The patient was placed in Trendelenburg position.  The left subclavian vein was accessed with a 16 gauge needle and a guide wire threaded through the needle into the vein.  The position of the wire was checked with fluoroscopy.   I then developed a pocket in the upper inner aspect of the left chest for the port reservoir.  I used the [employer] for venous access.  The reservoir was sewn in place with a 3-0 Vicryl suture.  The reservoir had been flushed with dilute (10 units/cc) heparin.   I then passed the silastic tubing from the  reservoir incision to the subclavian stick site and used the 8 French introducer to pass it into the vein.  The tip of the silastic catheter was position at the junction of the SVC and the right atrium under fluoroscopy.  The silastic catheter was then attached to the port with the bayonet device.     The entire port and tubing were checked with fluoroscopy and then the port was flushed with 4 cc of concentrated heparin (100 units/cc).   The wounds were then closed with 3-0 vicryl subcutaneous sutures and the skin closed with a 4-0 Monocryl suture.  The skin was painted with LiquiBand.  Because she is to get chemotherapy, I left the port accessed with a right angle Huber needle.   The patient was transferred to the recovery room in good condition.  The sponge and needle count were correct at the end of the case.  A CXR is ordered for port placement and pending at the time of this note.  Alphonsa Overall, MD, [facility] Pager: [phone] Office phone:  [phone]

## 2015-03-23 NOTE — Anesthesia Postprocedure Evaluation (Signed)
  Anesthesia Post-op Note  Patient: Mariah Kelley  Procedure(s) Performed: Procedure(s) (LRB): INSERTION PORT-A-CATH  (Left)  Patient Location: PACU  Anesthesia Type: General  Level of Consciousness: awake and alert   Airway and Oxygen Therapy: Patient Spontanous Breathing  Post-op Pain: mild  Post-op Assessment: Post-op Vital signs reviewed, Patient's Cardiovascular Status Stable, Respiratory Function Stable, Patent Airway and No signs of Nausea or vomiting  Last Vitals:  Filed Vitals:   03/23/15 1209  BP: 157/72  Pulse: 71  Temp: 36.6 C  Resp:     Post-op Vital Signs: stable   Complications: No apparent anesthesia complications

## 2015-03-24 ENCOUNTER — Other Ambulatory Visit (HOSPITAL_BASED_OUTPATIENT_CLINIC_OR_DEPARTMENT_OTHER): Payer: BLUE CROSS/BLUE SHIELD

## 2015-03-24 ENCOUNTER — Ambulatory Visit: Payer: BLUE CROSS/BLUE SHIELD

## 2015-03-24 ENCOUNTER — Telehealth: Payer: Self-pay | Admitting: *Deleted

## 2015-03-24 ENCOUNTER — Ambulatory Visit (HOSPITAL_BASED_OUTPATIENT_CLINIC_OR_DEPARTMENT_OTHER): Payer: BLUE CROSS/BLUE SHIELD

## 2015-03-24 ENCOUNTER — Ambulatory Visit (HOSPITAL_BASED_OUTPATIENT_CLINIC_OR_DEPARTMENT_OTHER): Payer: BLUE CROSS/BLUE SHIELD | Admitting: Hematology and Oncology

## 2015-03-24 ENCOUNTER — Telehealth: Payer: Self-pay | Admitting: Hematology and Oncology

## 2015-03-24 ENCOUNTER — Encounter (HOSPITAL_COMMUNITY): Payer: Self-pay | Admitting: Surgery

## 2015-03-24 VITALS — BP 160/69 | HR 78 | Temp 98.3°F | Resp 18 | Ht 69.0 in | Wt 245.3 lb

## 2015-03-24 DIAGNOSIS — B373 Candidiasis of vulva and vagina: Secondary | ICD-10-CM | POA: Diagnosis not present

## 2015-03-24 DIAGNOSIS — C50411 Malignant neoplasm of upper-outer quadrant of right female breast: Secondary | ICD-10-CM | POA: Diagnosis not present

## 2015-03-24 DIAGNOSIS — M899 Disorder of bone, unspecified: Secondary | ICD-10-CM

## 2015-03-24 DIAGNOSIS — Z5189 Encounter for other specified aftercare: Secondary | ICD-10-CM | POA: Diagnosis not present

## 2015-03-24 DIAGNOSIS — Z5111 Encounter for antineoplastic chemotherapy: Secondary | ICD-10-CM | POA: Diagnosis not present

## 2015-03-24 DIAGNOSIS — Z170 Estrogen receptor positive status [ER+]: Secondary | ICD-10-CM | POA: Diagnosis not present

## 2015-03-24 DIAGNOSIS — Z95828 Presence of other vascular implants and grafts: Secondary | ICD-10-CM

## 2015-03-24 DIAGNOSIS — F19982 Other psychoactive substance use, unspecified with psychoactive substance-induced sleep disorder: Secondary | ICD-10-CM

## 2015-03-24 LAB — COMPREHENSIVE METABOLIC PANEL (CC13)
ALT: 19 U/L (ref 0–55)
AST: 12 U/L (ref 5–34)
Albumin: 3.7 g/dL (ref 3.5–5.0)
Alkaline Phosphatase: 73 U/L (ref 40–150)
Anion Gap: 10 mEq/L (ref 3–11)
BUN: 19 mg/dL (ref 7.0–26.0)
CALCIUM: 9.7 mg/dL (ref 8.4–10.4)
CHLORIDE: 108 meq/L (ref 98–109)
CO2: 27 mEq/L (ref 22–29)
Creatinine: 0.8 mg/dL (ref 0.6–1.1)
EGFR: 81 mL/min/{1.73_m2} — ABNORMAL LOW (ref >90)
Glucose: 107 mg/dl (ref 70–140)
Potassium: 4.4 mEq/L (ref 3.5–5.1)
SODIUM: 144 meq/L (ref 136–145)
TOTAL PROTEIN: 6.6 g/dL (ref 6.4–8.3)
Total Bilirubin: 0.33 mg/dL (ref 0.20–1.20)

## 2015-03-24 LAB — CBC WITH DIFFERENTIAL/PLATELET
BASO%: 0.8 % (ref 0.0–2.0)
BASOS ABS: 0.1 10*3/uL (ref 0.0–0.1)
EOS%: 0 % (ref 0.0–7.0)
Eosinophils Absolute: 0 10*3/uL (ref 0.0–0.5)
HCT: 33.4 % — ABNORMAL LOW (ref 34.8–46.6)
HEMOGLOBIN: 11.3 g/dL — AB (ref 11.6–15.9)
LYMPH%: 8.2 % — ABNORMAL LOW (ref 14.0–49.7)
MCH: 28.8 pg (ref 25.1–34.0)
MCHC: 33.7 g/dL (ref 31.5–36.0)
MCV: 85.5 fL (ref 79.5–101.0)
MONO#: 1.3 10*3/uL — ABNORMAL HIGH (ref 0.1–0.9)
MONO%: 7.4 % (ref 0.0–14.0)
NEUT%: 83.6 % — ABNORMAL HIGH (ref 38.4–76.8)
NEUTROS ABS: 14.3 10*3/uL — AB (ref 1.5–6.5)
PLATELETS: 416 10*3/uL — AB (ref 145–400)
RBC: 3.91 10*6/uL (ref 3.70–5.45)
RDW: 13.1 % (ref 11.2–14.5)
WBC: 17.1 10*3/uL — ABNORMAL HIGH (ref 3.9–10.3)
lymph#: 1.4 10*3/uL (ref 0.9–3.3)

## 2015-03-24 MED ORDER — DOCETAXEL CHEMO INJECTION 160 MG/16ML
75.0000 mg/m2 | Freq: Once | INTRAVENOUS | Status: AC
  Administered 2015-03-24: 170 mg via INTRAVENOUS
  Filled 2015-03-24: qty 17

## 2015-03-24 MED ORDER — PEGFILGRASTIM 6 MG/0.6ML ~~LOC~~ PSKT
6.0000 mg | PREFILLED_SYRINGE | Freq: Once | SUBCUTANEOUS | Status: AC
  Administered 2015-03-24: 6 mg via SUBCUTANEOUS
  Filled 2015-03-24: qty 0.6

## 2015-03-24 MED ORDER — PALONOSETRON HCL INJECTION 0.25 MG/5ML
INTRAVENOUS | Status: AC
Start: 2015-03-24 — End: 2015-03-24
  Filled 2015-03-24: qty 5

## 2015-03-24 MED ORDER — SODIUM CHLORIDE 0.9 % IJ SOLN
10.0000 mL | INTRAMUSCULAR | Status: DC | PRN
  Administered 2015-03-24: 10 mL via INTRAVENOUS
  Filled 2015-03-24: qty 10

## 2015-03-24 MED ORDER — HEPARIN SOD (PORK) LOCK FLUSH 100 UNIT/ML IV SOLN
500.0000 [IU] | Freq: Once | INTRAVENOUS | Status: AC | PRN
  Administered 2015-03-24: 500 [IU]
  Filled 2015-03-24: qty 5

## 2015-03-24 MED ORDER — PALONOSETRON HCL INJECTION 0.25 MG/5ML
0.2500 mg | Freq: Once | INTRAVENOUS | Status: AC
  Administered 2015-03-24: 0.25 mg via INTRAVENOUS

## 2015-03-24 MED ORDER — SODIUM CHLORIDE 0.9 % IV SOLN
600.0000 mg/m2 | Freq: Once | INTRAVENOUS | Status: AC
  Administered 2015-03-24: 1400 mg via INTRAVENOUS
  Filled 2015-03-24: qty 70

## 2015-03-24 MED ORDER — SODIUM CHLORIDE 0.9 % IJ SOLN
10.0000 mL | INTRAMUSCULAR | Status: DC | PRN
  Administered 2015-03-24: 10 mL
  Filled 2015-03-24: qty 10

## 2015-03-24 MED ORDER — DEXAMETHASONE 2 MG PO TABS: 4.0000 mg | ORAL_TABLET | Freq: Two times a day (BID) | ORAL | Status: DC

## 2015-03-24 MED ORDER — SODIUM CHLORIDE 0.9 % IV SOLN
Freq: Once | INTRAVENOUS | Status: AC
  Administered 2015-03-24: 13:00:00 via INTRAVENOUS

## 2015-03-24 MED ORDER — SODIUM CHLORIDE 0.9 % IV SOLN
Freq: Once | INTRAVENOUS | Status: AC
  Administered 2015-03-24: 13:00:00 via INTRAVENOUS
  Filled 2015-03-24: qty 1

## 2015-03-24 NOTE — Telephone Encounter (Signed)
Per staff message and POF I have scheduled appts. Advised scheduler of appts. JMW  

## 2015-03-24 NOTE — Patient Instructions (Signed)
[facility] Discharge Instructions for Patients Receiving Chemotherapy  Today you received the following chemotherapy agents Taxotere/Cytoxan To help prevent nausea and vomiting after your treatment, we encourage you to take your nausea medication as prescribed.   If you develop nausea and vomiting that is not controlled by your nausea medication, call the clinic.   BELOW ARE SYMPTOMS THAT SHOULD BE REPORTED IMMEDIATELY:  *FEVER GREATER THAN 100.5 F  *CHILLS WITH OR WITHOUT FEVER  NAUSEA AND VOMITING THAT IS NOT CONTROLLED WITH YOUR NAUSEA MEDICATION  *UNUSUAL SHORTNESS OF BREATH  *UNUSUAL BRUISING OR BLEEDING  TENDERNESS IN MOUTH AND THROAT WITH OR WITHOUT PRESENCE OF ULCERS  *URINARY PROBLEMS  *BOWEL PROBLEMS  UNUSUAL RASH Items with * indicate a potential emergency and should be followed up as soon as possible.  Feel free to call the clinic you have any questions or concerns. The clinic phone number is [phone].  Please show the CHEMO ALERT CARD at check-in to the Emergency Department and triage nurse.   

## 2015-03-24 NOTE — Progress Notes (Signed)
Patient Care Team: Jonathon Jordan, MD as PCP - General (Family Medicine) Alphonsa Overall, MD as Consulting Physician (General Surgery) Nicholas Lose, MD as Consulting Physician (Hematology and Oncology) Thea Silversmith, MD as Consulting Physician (Radiation Oncology) Mauro Kaufmann, RN as Registered Nurse Rockwell Germany, RN as Registered Nurse Holley Bouche, NP as Nurse Practitioner (Nurse Practitioner) Irene Limbo, MD as Consulting Physician (Plastic Surgery)  DIAGNOSIS: Breast cancer of upper-outer quadrant of right female breast   Staging form: Breast, AJCC 7th Edition     Clinical stage from 12/30/2014: Stage IIA (T2, N0, M0) - Unsigned     Pathologic stage from 02/02/2015: Stage IIA (T2, N0, cM0) - Signed by Enid Cutter, MD on 02/08/2015       Staging comments: Staged on final mastectomy specimen by Dr. Avis Epley.    SUMMARY OF ONCOLOGIC HISTORY:   Breast cancer of upper-outer quadrant of right female breast   12/17/2014 Initial Biopsy Right breast biopsy: Invasive ductal carcinoma with DCIS, right axillary lymph node benign   12/24/2014 Initial Biopsy Right breast core biopsy: At least atypical ductal hyperplasia with associated calcification but findings suspicious for DCIS   12/29/2014 Breast MRI 2.1 cm mass in the upper-outer quadrant of the right breast 2 possible satellite nodules associated with this mass. NME 2.2cm, together 5.1 cm   01/26/2015 Surgery Right breast lumpectomy: 2.2 cm invasive ductal carcinoma with DCIS, grade 2, sentinel lymph node negative, T2 N0 M0 stage II a, ER 100%, PR 0%, HER-2 negative, Ki-67 21%   03/03/2015 -  Chemotherapy Adjuvant chemotherapy with Taxotere Cytoxan 4    CHIEF COMPLIANT: Cycle 2 Taxotere Cytoxan  INTERVAL HISTORY: MONIQUE HEFTY is a 64 year old with above-mentioned history of right-sided breast cancer treated with lumpectomy and is currently receiving adjuvant chemotherapy with Taxotere and Cytoxan. Today is cycle 2 of  chemotherapy. After cycle one she had vaginal candidiasis which resolved with Diflucan. She also Neulasta related bone pain. Apart from that she has done very well over the past 2 weeks. She did lose her hair.  REVIEW OF SYSTEMS:   Constitutional: Denies fevers, chills or abnormal weight loss Eyes: Denies blurriness of vision Ears, nose, mouth, throat, and face: Denies mucositis or sore throat Respiratory: Denies cough, dyspnea or wheezes Cardiovascular: Denies palpitation, chest discomfort or lower extremity swelling Gastrointestinal:  Denies nausea, heartburn or change in bowel habits Skin: Denies abnormal skin rashes Lymphatics: Denies new lymphadenopathy or easy bruising Neurological:Denies numbness, tingling or new weaknesses Behavioral/Psych: Mood is stable, no new changes  Breast:  denies any pain or lumps or nodules in either breasts All other systems were reviewed with the patient and are negative.  I have reviewed the past medical history, past surgical history, social history and family history with the patient and they are unchanged from previous note.  ALLERGIES:  is allergic to codeine and prednisone.  MEDICATIONS:  Current Outpatient Prescriptions  Medication Sig Dispense Refill  . acidophilus (RISAQUAD) CAPS capsule Take 1 capsule by mouth daily.    [name] aspirin EC 81 MG tablet Take 81 mg by mouth daily.    [name] atorvastatin (LIPITOR) 80 MG tablet TAKE ONE TABLET BY MOUTH ONE TIME DAILY at 6 PM 30 tablet 11  . [START ON 03/25/2015] clopidogrel (PLAVIX) 75 MG tablet Take 1 tablet (75 mg total) by mouth daily. 90 tablet 3  . dexamethasone (DECADRON) 2 MG tablet Take 2 tablets (4 mg total) by mouth 2 (two) times daily. Start the day before Taxotere. Then again  the day after chemo for 3 days. 30 tablet 0  . dexamethasone (DECADRON) 4 MG tablet   0  . fluconazole (DIFLUCAN) 100 MG tablet TAKE 1 TABLET (100 MG TOTAL) BY MOUTH DAILY. 10 tablet 0  . HYDROcodone-acetaminophen  (NORCO/VICODIN) 5-325 MG per tablet Take 1-2 tablets by mouth every 6 (six) hours as needed. 30 tablet 0  . lidocaine-prilocaine (EMLA) cream Apply 1 application topically once.     [name] LORazepam (ATIVAN) 0.5 MG tablet Take 1 tablet (0.5 mg total) by mouth every 6 (six) hours as needed (Nausea or vomiting). 30 tablet 0  . metoprolol (LOPRESSOR) 50 MG tablet TAKE ONE TABLET BY MOUTH TWICE DAILY  60 tablet 11  . nitroGLYCERIN (NITROSTAT) 0.6 MG SL tablet Place 0.6 mg under the tongue every 5 (five) minutes as needed for chest pain.    [name] ondansetron (ZOFRAN) 4 MG tablet   0  . ondansetron (ZOFRAN) 8 MG tablet Take 1 tablet (8 mg total) by mouth 2 (two) times daily. Start the day after chemo for 3 days. Then take as needed for nausea or vomiting. 30 tablet 1  . prochlorperazine (COMPAZINE) 10 MG tablet Take 1 tablet (10 mg total) by mouth every 6 (six) hours as needed (Nausea or vomiting). 30 tablet 1  . Vitamin D, Ergocalciferol, (DRISDOL) 50000 UNITS CAPS capsule Take 50,000 Units by mouth every 7 (seven) days.     No current facility-administered medications for this visit.    PHYSICAL EXAMINATION: ECOG PERFORMANCE STATUS: 1 - Symptomatic but completely ambulatory  Filed Vitals:   03/24/15 1128  BP: 160/69  Pulse: 78  Temp: 98.3 F (36.8 C)  Resp: 18   Filed Weights   03/24/15 1128  Weight: 245 lb 4.8 oz (111.267 kg)    GENERAL:alert, no distress and comfortable, alopecia SKIN: skin color, texture, turgor are normal, no rashes or significant lesions EYES: normal, Conjunctiva are pink and non-injected, sclera clear OROPHARYNX:no exudate, no erythema and lips, buccal mucosa, and tongue normal  NECK: supple, thyroid normal size, non-tender, without nodularity LYMPH:  no palpable lymphadenopathy in the cervical, axillary or inguinal LUNGS: clear to auscultation and percussion with normal breathing effort HEART: regular rate & rhythm and no murmurs and no lower extremity  edema ABDOMEN:abdomen soft, non-tender and normal bowel sounds Musculoskeletal:no cyanosis of digits and no clubbing  NEURO: alert & oriented x 3 with fluent speech, no focal motor/sensory deficits   LABORATORY DATA:  I have reviewed the data as listed   Chemistry      Component Value Date/Time   NA 145 03/18/2015 1400   NA 139 03/10/2015 0817   K 4.4 03/18/2015 1400   K 4.3 03/10/2015 0817   CL 108 03/18/2015 1400   CO2 29 03/18/2015 1400   CO2 26 03/10/2015 0817   BUN 20 03/18/2015 1400   BUN 14.4 03/10/2015 0817   CREATININE 0.86 03/18/2015 1400   CREATININE 0.8 03/10/2015 0817      Component Value Date/Time   CALCIUM 9.7 03/18/2015 1400   CALCIUM 9.4 03/10/2015 0817   ALKPHOS 87 03/10/2015 0817   ALKPHOS 80 01/17/2014 0713   AST 17 03/10/2015 0817   AST 21 01/17/2014 0713   ALT 25 03/10/2015 0817   ALT 22 01/17/2014 0713   BILITOT 0.54 03/10/2015 0817   BILITOT 0.3 01/17/2014 0713       Lab Results  Component Value Date   WBC 17.1* 03/24/2015   HGB 11.3* 03/24/2015   HCT 33.4* 03/24/2015  MCV 85.5 03/24/2015   PLT 416* 03/24/2015   NEUTROABS 14.3* 03/24/2015     RADIOGRAPHIC STUDIES: I have personally reviewed the radiology reports and agreed with their findings. Dg Chest Port 1 View  03/23/2015   CLINICAL DATA:  64 year old female status post porta cath placement. Initial encounter. Stage II breast cancer.  EXAM: PORTABLE CHEST - 1 VIEW  COMPARISON:  Chest radiographs 07/28/2009.  FINDINGS: Portable AP semi upright view at 1138 hrs. Left chest subclavian approach porta cath placed. Catheter tip projects at the lower SVC level about 1.5 vertebral bodies below the carina. No pneumothorax. Stable lung volumes. Stable cardiac size and mediastinal contours. No acute pulmonary opacity. Chronic appearing osseous changes at the left humeral head.  IMPRESSION: Left chest porta cath placed with no adverse features.   Electronically Signed   By: Genevie Ann M.D.   On:  03/23/2015 11:54   Dg C-arm 1-60 Min-no Report  03/23/2015   CLINICAL DATA: port placement   C-ARM 1-60 MINUTES  Fluoroscopy was utilized by the requesting physician.  No radiographic  interpretation.      ASSESSMENT & PLAN:  Breast cancer of upper-outer quadrant of right female breast Right breast mastectomy 01/27/2015: 2.2 cm grade 2 invasive ductal carcinoma ER 100%, PR 0%, HER-2 negative, Ki-67 21%, margins negative, sentinel lymph nodes negative Oncotype score 25; 16 % risk of recurrence with tamoxifen alone  Treatment plan: Taxotere Cytoxan every 3 weeks 4 Current treatment: Taxotere Cytoxan cycle 2 day 1  Chemotherapy toxicities: 1. Neulasta related bone pain: I instructed her take Claritin-D every day for 7 days with next chemotherapy 2. Vaginal candidiasis: Prescribed her Diflucan instructed her to hold Lipitor while she is on Diflucan because of interactions. 3. Difficulty sleeping due to steroids 4. Alopecia  Because of difficulty with venous access, she underwent a port placement yesterday. Decreased the steroid dose  monitoring closely for chemotherapy toxicities Return to clinic in 3 weeks for cycle 3    No orders of the defined types were placed in this encounter.   The patient has a good understanding of the overall plan. she agrees with it. she will call with any problems that may develop before the next visit here.   Rulon Eisenmenger, MD

## 2015-03-24 NOTE — Patient Instructions (Signed)

## 2015-03-24 NOTE — Telephone Encounter (Signed)
Gave avs & calendar for June. Sent message to schedule treatment. °

## 2015-04-04 ENCOUNTER — Emergency Department (HOSPITAL_COMMUNITY)
Admission: EM | Admit: 2015-04-04 | Discharge: 2015-04-05 | Disposition: A | Discharge status: Home / Self Care | Payer: BLUE CROSS/BLUE SHIELD | Attending: Emergency Medicine | Admitting: Emergency Medicine

## 2015-04-04 ENCOUNTER — Encounter (HOSPITAL_COMMUNITY): Payer: Self-pay | Admitting: Emergency Medicine

## 2015-04-04 ENCOUNTER — Emergency Department (HOSPITAL_COMMUNITY): Payer: BLUE CROSS/BLUE SHIELD

## 2015-04-04 DIAGNOSIS — J209 Acute bronchitis, unspecified: Secondary | ICD-10-CM | POA: Insufficient documentation

## 2015-04-04 DIAGNOSIS — Z87891 Personal history of nicotine dependence: Secondary | ICD-10-CM | POA: Diagnosis not present

## 2015-04-04 DIAGNOSIS — R0789 Other chest pain: Secondary | ICD-10-CM | POA: Diagnosis not present

## 2015-04-04 DIAGNOSIS — R0781 Pleurodynia: Secondary | ICD-10-CM

## 2015-04-04 DIAGNOSIS — E785 Hyperlipidemia, unspecified: Secondary | ICD-10-CM | POA: Insufficient documentation

## 2015-04-04 DIAGNOSIS — Z79899 Other long term (current) drug therapy: Secondary | ICD-10-CM | POA: Diagnosis not present

## 2015-04-04 DIAGNOSIS — I10 Essential (primary) hypertension: Secondary | ICD-10-CM | POA: Insufficient documentation

## 2015-04-04 DIAGNOSIS — M1990 Unspecified osteoarthritis, unspecified site: Secondary | ICD-10-CM | POA: Diagnosis not present

## 2015-04-04 DIAGNOSIS — I2510 Atherosclerotic heart disease of native coronary artery without angina pectoris: Secondary | ICD-10-CM | POA: Diagnosis not present

## 2015-04-04 DIAGNOSIS — Z7982 Long term (current) use of aspirin: Secondary | ICD-10-CM | POA: Insufficient documentation

## 2015-04-04 DIAGNOSIS — E669 Obesity, unspecified: Secondary | ICD-10-CM | POA: Insufficient documentation

## 2015-04-04 DIAGNOSIS — H269 Unspecified cataract: Secondary | ICD-10-CM | POA: Insufficient documentation

## 2015-04-04 DIAGNOSIS — Z7902 Long term (current) use of antithrombotics/antiplatelets: Secondary | ICD-10-CM | POA: Diagnosis not present

## 2015-04-04 DIAGNOSIS — J40 Bronchitis, not specified as acute or chronic: Secondary | ICD-10-CM

## 2015-04-04 DIAGNOSIS — Z853 Personal history of malignant neoplasm of breast: Secondary | ICD-10-CM | POA: Diagnosis not present

## 2015-04-04 DIAGNOSIS — R05 Cough: Secondary | ICD-10-CM | POA: Diagnosis present

## 2015-04-04 NOTE — ED Notes (Signed)
Pt from home c/o pain when taking a deep breath,nasal congestion, sore throat, and cough. Denies fever. Last chemo treatment was last Wednesday. Pt being treated for breast CA. Lungs sounds clear.

## 2015-04-05 ENCOUNTER — Emergency Department (HOSPITAL_COMMUNITY): Payer: BLUE CROSS/BLUE SHIELD

## 2015-04-05 ENCOUNTER — Encounter (HOSPITAL_COMMUNITY): Payer: Self-pay

## 2015-04-05 LAB — COMPREHENSIVE METABOLIC PANEL
ALK PHOS: 81 U/L (ref 38–126)
ALT: 15 U/L (ref 14–54)
AST: 13 U/L — ABNORMAL LOW (ref 15–41)
Albumin: 3.9 g/dL (ref 3.5–5.0)
Anion gap: 6 (ref 5–15)
BUN: 20 mg/dL (ref 6–20)
CALCIUM: 9.1 mg/dL (ref 8.9–10.3)
CO2: 27 mmol/L (ref 22–32)
Chloride: 106 mmol/L (ref 101–111)
Creatinine, Ser: 0.7 mg/dL (ref 0.44–1.00)
GFR calc Af Amer: >60 mL/min (ref >60)
Glucose, Bld: 127 mg/dL — ABNORMAL HIGH (ref 65–99)
POTASSIUM: 4.1 mmol/L (ref 3.5–5.1)
SODIUM: 139 mmol/L (ref 135–145)
TOTAL PROTEIN: 6.9 g/dL (ref 6.5–8.1)
Total Bilirubin: 0.5 mg/dL (ref 0.3–1.2)

## 2015-04-05 LAB — CBC WITH DIFFERENTIAL/PLATELET
BASOS ABS: 0.1 10*3/uL (ref 0.0–0.1)
BASOS PCT: 1 % (ref 0–1)
Eosinophils Absolute: 0.3 10*3/uL (ref 0.0–0.7)
Eosinophils Relative: 1 % (ref 0–5)
HCT: 32.6 % — ABNORMAL LOW (ref 36.0–46.0)
Hemoglobin: 10.4 g/dL — ABNORMAL LOW (ref 12.0–15.0)
Lymphocytes Relative: 9 % — ABNORMAL LOW (ref 12–46)
Lymphs Abs: 1.9 10*3/uL (ref 0.7–4.0)
MCH: 28.3 pg (ref 26.0–34.0)
MCHC: 31.9 g/dL (ref 30.0–36.0)
MCV: 88.6 fL (ref 78.0–100.0)
MONO ABS: 1.2 10*3/uL — AB (ref 0.1–1.0)
MONOS PCT: 6 % (ref 3–12)
Neutro Abs: 17.6 10*3/uL — ABNORMAL HIGH (ref 1.7–7.7)
Neutrophils Relative %: 83 % — ABNORMAL HIGH (ref 43–77)
Platelets: 266 10*3/uL (ref 150–400)
RBC: 3.68 MIL/uL — ABNORMAL LOW (ref 3.87–5.11)
RDW: 14.2 % (ref 11.5–15.5)
WBC: 21.1 10*3/uL — AB (ref 4.0–10.5)

## 2015-04-05 MED ORDER — DM-GUAIFENESIN ER 30-600 MG PO TB12: 1.0000 | ORAL_TABLET | Freq: Two times a day (BID) | ORAL | Status: DC | PRN

## 2015-04-05 MED ORDER — AZITHROMYCIN 250 MG PO TABS: ORAL_TABLET | ORAL | Status: DC

## 2015-04-05 MED ORDER — SODIUM CHLORIDE 0.9 % IJ SOLN: 10.0000 mL | INTRAMUSCULAR | Status: DC | PRN

## 2015-04-05 MED ORDER — HEPARIN SOD (PORK) LOCK FLUSH 100 UNIT/ML IV SOLN
500.0000 [IU] | Freq: Once | INTRAVENOUS | Status: AC
  Administered 2015-04-05: 500 [IU]
  Filled 2015-04-05: qty 5

## 2015-04-05 MED ORDER — IOHEXOL 350 MG/ML SOLN
100.0000 mL | Freq: Once | INTRAVENOUS | Status: AC | PRN
  Administered 2015-04-05: 100 mL via INTRAVENOUS

## 2015-04-05 MED ORDER — DM-GUAIFENESIN ER 30-600 MG PO TB12
1.0000 | ORAL_TABLET | Freq: Once | ORAL | Status: AC
  Administered 2015-04-05: 1 via ORAL
  Filled 2015-04-05: qty 1

## 2015-04-05 MED ORDER — NAPROXEN 375 MG PO TABS: ORAL_TABLET | ORAL | Status: DC

## 2015-04-05 MED ORDER — KETOROLAC TROMETHAMINE 30 MG/ML IJ SOLN
30.0000 mg | Freq: Once | INTRAMUSCULAR | Status: AC
  Administered 2015-04-05: 30 mg via INTRAVENOUS
  Filled 2015-04-05: qty 1

## 2015-04-05 NOTE — Discharge Instructions (Signed)
Take the medications as prescribed. Recheck if you get a fever, struggle to breathe, cough up yellow or green mucus or feel worse.    Pleurodynia Pleurodynia is a sharp pain in the muscles between your ribs (intercostal muscles). This condition makes it painful to breathe. Pleurodynia is sometimes described as an iron grip around the rib cage. Pleurodynia attacks are unpredictable. CAUSES  Pleurodynia is commonly caused by a viral infection. A virus, called coxsackievirus B, attacks the intercostal muscles. However, getting pleurodynia from this virus is rare. Most people infected with coxsackievirus B have no symptoms. In some people, the virus causes a mild sore throat, cough, or diarrhea. Coxsackievirus B can live in body fluids, such as saliva and mucus. It is easily spread from person to person through coughing or sneezing. Coming in contact with the stool of an infected person can also spread the virus. SYMPTOMS  Symptoms usually start 3 to 6 days after you have been infected with the virus. Very bad chest pain is the main symptom of pleurodynia. The pain is usually felt on one side of the body, along the lower ribs. It starts suddenly and may last from a few seconds to 1 minute. It is hard to breathe when the pain strikes. You might feel pain again a few minutes or hours later. In most cases, the pain keeps coming back for 3 to 5 days. Then it goes away. In some cases, the pain keeps coming back every so often for up to 1 month. Other symptoms of pleurodynia may include:   Fever.  Rapid heartbeat.  Sore throat.  Cough.  Headache.  Stomach pain.  Nausea.  Vomiting.  Diarrhea.  Feeling very tired.  Rash.  For males, pain in the testicles. DIAGNOSIS  If you have had very bad chest pain, your caregiver will probably order some tests to determine whether you have pleurodynia. These tests may include:  A throat swab. Your caregiver may rub the back of your throat with a cotton  swab. The cotton swab can then be tested for coxsackievirus B.  Urine and stool samples. These samples will be tested for coxsackievirus B.  Blood tests. These tests can tell if you have muscle damage.  Chest X-rays.  Electrocardiography (ECG). This test checks your heartbeat. TREATMENT  There is no treatment for an infection caused by coxsackievirus B. However, pleurodynia usually goes away on its own. It may take up to 1 month to fully recover. You may be given nonsteroidal anti-inflammatory drugs (NSAIDs) to control your pain. If your chest pain continues, you may need to see a pain specialist to discuss possibly using nerve block injections to relieve your pain. HOME CARE INSTRUCTIONS  Only take over-the-counter or prescription medicines for pain, fever, or discomfort as directed by your caregiver.  Return to your regular activities slowly.  Wash your hands often. This helps prevent coxsackievirus B from spreading.  Do not smoke.  Keep all follow-up appointments as directed by your caregiver. SEEK MEDICAL CARE IF:  You have new symptoms.  Your symptoms are getting worse.  You develop a cough.  You have a sore throat.  You have a rash.  You have abdominal pain.  You vomit.  You have diarrhea. SEEK IMMEDIATE MEDICAL CARE IF:  You have very bad chest pain that is getting worse.  You have trouble breathing.  You have a fever. MAKE SURE YOU:  Understand these instructions.  Will watch your condition.  Will get help right away if you  are not doing well or get worse. Document Released: 10/12/2011 Document Revised: 01/15/2012 Document Reviewed: 10/12/2011 [facility] Patient Information 2015 [place], [place]. This information is not intended to replace advice given to you by your health care provider. Make sure you discuss any questions you have with your health care provider.

## 2015-04-05 NOTE — ED Provider Notes (Signed)
CSN: [id number]     Arrival date & time 04/04/15  2234 History   First MD Initiated Contact with Patient 04/04/15 2305     Chief Complaint  Patient presents with  . Cough     (Consider location/radiation/quality/duration/timing/severity/associated sxs/prior Treatment) Patient is a 64 y.o. female presenting with cough.  Cough   Patient states she was diagnosed with breast cancer on February 16. She has had a right mastectomy states her cancer was removed intact. She is getting chemotherapy and her last chemotherapy was May 18. She states she's been having rhinorrhea for a long time. She started coughing about 24 hours ago and it got worse this evening. She has right-sided chest pain described as tightness while she coughs and when she takes a big deep breath.She states it helps the pain to put pressure on her chest when she breathes deeply. She states she's coughing up clear mucus. She denies fever, shortness of breath, dyspnea on exertion. She states she has a mild sore throat and it feels like postnasal dripping. She denies vomiting or diarrhea. She denies chills. She denies being around anybody else is ill. She states she's never had this before. Patient does states she drove 4 hours from [facility] today after driving there 4 days ago. She denies any swelling or pain in her extremities.  PCP Dr Stephanie Acre' Oncology Dr Lindi Adie  Past Medical History  Diagnosis Date  . History of tobacco abuse     40 pack-years  . Obesity   . Hyperlipidemia     takes Lipitor daily  . ST elevation myocardial infarction (STEMI) of inferolateral wall 01/17/2014  . Breast cancer of upper-outer quadrant of right female breast 12/23/2014  . Arthritis   . Hypertension     takes Metoprolol daily  . History of bronchitis 7-67yrs ago  . Joint pain   . Bruises easily   . Cataracts, bilateral   . CAD S/P percutaneous coronary angioplasty 01/17/2014    takes Brilinta and Plavix daily   Past Surgical  History  Procedure Laterality Date  . Abdominal hysterectomy    . Spinal fusion    . Appendectomy    . Percutaneous coronary stent intervention (pci-s)    . Left heart cath Bilateral 01/17/2014    Procedure: LEFT HEART CATH;  Surgeon: Lorretta Harp, MD;  Location: [facility] CATH LAB;  Service: Cardiovascular;  Laterality: Bilateral;  . Cholecystectomy    . Tubal ligation    . Colonoscopy    . Mastectomy w/ sentinel node biopsy Right 01/27/2015    dr Lucia Gaskins   . Breast reduction surgery Left 01/27/2015  . Mastectomy w/ sentinel node biopsy Right 01/27/2015    Procedure: RIGHT MASTECTOMY WITH RIGHT AXILLARY  SENTINEL LYMPH NODE BIOPSY;  Surgeon: Alphonsa Overall, MD;  Location: [place];  Service: General;  Laterality: Right;  . Breast reduction surgery Left 01/27/2015    Procedure: MAMMARY REDUCTION  LEFT (BREAST);  Surgeon: Irene Limbo, MD;  Location: [place];  Service: Plastics;  Laterality: Left;  . Portacath placement Left 03/23/2015    Procedure: INSERTION PORT-A-CATH ;  Surgeon: Alphonsa Overall, MD;  Location: [facility];  Service: General;  Laterality: Left;   Family History  Problem Relation Age of Onset  . Heart attack Mother 47    passed at 78  . Heart attack Father 103    s/p CABG, multiple PCIs  . CAD Brother     Still living  . CAD Brother     Still  living   History  Substance Use Topics  . Smoking status: Former Smoker -- 1.00 packs/day for 40 years    Types: Cigarettes    Quit date: 07/30/2009  . Smokeless tobacco: Former Systems developer     Comment: quit 5 yrs ago  . Alcohol Use: No   employed  OB History    No data available     Review of Systems  Respiratory: Positive for cough.   All other systems reviewed and are negative.     Allergies  Codeine and Prednisone  Home Medications   Prior to Admission medications   Medication Sig Start Date End Date Taking? Authorizing Provider  acetaminophen (TYLENOL) 500 MG tablet Take 500 mg by mouth every 6 (six) hours as needed for  mild pain.   Yes Historical Provider, MD  acidophilus (RISAQUAD) CAPS capsule Take 1 capsule by mouth daily.   Yes Historical Provider, MD  aspirin EC 81 MG tablet Take 81 mg by mouth daily.   Yes Historical Provider, MD  atorvastatin (LIPITOR) 80 MG tablet TAKE ONE TABLET BY MOUTH ONE TIME DAILY at 6 PM 02/03/15  Yes Lorretta Harp, MD  clopidogrel (PLAVIX) 75 MG tablet Take 1 tablet (75 mg total) by mouth daily. 03/25/15  Yes Alphonsa Overall, MD  fluconazole (DIFLUCAN) 100 MG tablet TAKE 1 TABLET (100 MG TOTAL) BY MOUTH DAILY. 03/11/15  Yes Nicholas Lose, MD  lidocaine-prilocaine (EMLA) cream Apply 1 application topically as needed. For port 02/24/15  Yes Historical Provider, MD  metoprolol (LOPRESSOR) 50 MG tablet TAKE ONE TABLET BY MOUTH TWICE DAILY  02/03/15  Yes Lorretta Harp, MD  ondansetron (ZOFRAN) 8 MG tablet Take 1 tablet (8 mg total) by mouth 2 (two) times daily. Start the day after chemo for 3 days. Then take as needed for nausea or vomiting. 02/24/15  Yes Nicholas Lose, MD  prochlorperazine (COMPAZINE) 10 MG tablet Take 1 tablet (10 mg total) by mouth every 6 (six) hours as needed (Nausea or vomiting). 02/24/15  Yes Nicholas Lose, MD  azithromycin (ZITHROMAX) 250 MG tablet Take 2 po the first day then once a day for the next 4 days. 04/05/15   Rolland Porter, MD  dexamethasone (DECADRON) 2 MG tablet Take 2 tablets (4 mg total) by mouth 2 (two) times daily. Start the day before Taxotere. Then again the day after chemo for 3 days. Patient not taking: Reported on 04/04/2015 03/24/15   Nicholas Lose, MD  dexamethasone (DECADRON) 4 MG tablet Take 4 mg by mouth 2 (two) times daily.  02/24/15   Historical Provider, MD  dextromethorphan-guaiFENesin (MUCINEX DM) 30-600 MG per 12 hr tablet Take 1 tablet by mouth 2 (two) times daily as needed for cough. 04/05/15   Rolland Porter, MD  HYDROcodone-acetaminophen (NORCO/VICODIN) 5-325 MG per tablet Take 1-2 tablets by mouth every 6 (six) hours as needed. Patient not taking:  Reported on 04/04/2015 03/23/15   Alphonsa Overall, MD  LORazepam (ATIVAN) 0.5 MG tablet Take 1 tablet (0.5 mg total) by mouth every 6 (six) hours as needed (Nausea or vomiting). 02/24/15   Nicholas Lose, MD  naproxen (NAPROSYN) 375 MG tablet Take 1 po BID with food prn pain 04/05/15   Rolland Porter, MD  nitroGLYCERIN (NITROSTAT) 0.6 MG SL tablet Place 0.6 mg under the tongue every 5 (five) minutes as needed for chest pain.    Historical Provider, MD  ondansetron (ZOFRAN) 4 MG tablet  01/28/15   Historical Provider, MD  Vitamin D, Ergocalciferol, (DRISDOL) 50000 UNITS CAPS  capsule Take 50,000 Units by mouth every 7 (seven) days.    Historical Provider, MD   BP 109/79 mmHg  Pulse 102  Temp(Src) 98.3 F (36.8 C) (Oral)  Resp 15  Ht 5\' 9"  (1.753 m)  Wt 240 lb (108.863 kg)  BMI 35.43 kg/m2  SpO2 95%  Vital signs normal   Physical Exam  Constitutional: She is oriented to person, place, and time. She appears well-developed and well-nourished.  Non-toxic appearance. She does not appear ill. No distress.  HENT:  Head: Normocephalic and atraumatic.  Right Ear: External ear normal.  Left Ear: External ear normal.  Nose: Nose normal. No mucosal edema or rhinorrhea.  Mouth/Throat: Oropharynx is clear and moist and mucous membranes are normal. No dental abscesses or uvula swelling.  Eyes: Conjunctivae and EOM are normal. Pupils are equal, round, and reactive to light.  Neck: Normal range of motion and full passive range of motion without pain. Neck supple.  Cardiovascular: Normal rate, regular rhythm and normal heart sounds.  Exam reveals no gallop and no friction rub.   No murmur heard. Pulmonary/Chest: Effort normal and breath sounds normal. No respiratory distress. She has no wheezes. She has no rhonchi. She has no rales. She exhibits no tenderness and no crepitus.    Area of pain shown Pt has had right mastectomy  Abdominal: Soft. Normal appearance and bowel sounds are normal. She exhibits no  distension. There is no tenderness. There is no rebound and no guarding.  Musculoskeletal: Normal range of motion. She exhibits no edema or tenderness.  Moves all extremities well.   Neurological: She is alert and oriented to person, place, and time. She has normal strength. No cranial nerve deficit.  Skin: Skin is warm, dry and intact. No rash noted. No erythema. No pallor.  Pt is currently bald  Psychiatric: She has a normal mood and affect. Her speech is normal and behavior is normal. Her mood appears not anxious.  Nursing note and vitals reviewed.   ED Course  Procedures (including critical care time)  Medications  sodium chloride 0.9 % injection 10-40 mL (not administered)  ketorolac (TORADOL) 30 MG/ML injection 30 mg (30 mg Intravenous Given 04/05/15 0146)  dextromethorphan-guaiFENesin (MUCINEX DM) 30-600 MG per 12 hr tablet 1 tablet (1 tablet Oral Given 04/05/15 0146)  iohexol (OMNIPAQUE) 350 MG/ML injection 100 mL (100 mLs Intravenous Contrast Given 04/05/15 0205)  heparin lock flush 100 unit/mL (500 Units Intracatheter Given 04/05/15 0319)    Pt has pleuritic chest pain and cough without fever. Had recent travel and is getting chemotherapy for breast cancer. Her CXR was not revealing. We discussed getting chest CT to look for PE and she is agreeable. It would also show a subclinical pneumonia.   At time of discharge, Pt states her pain and cough is better. Given her CT results.    Labs Review Results for orders placed or performed during the hospital encounter of 04/04/15  Comprehensive metabolic panel  Result Value Ref Range   Sodium 139 135 - 145 mmol/L   Potassium 4.1 3.5 - 5.1 mmol/L   Chloride 106 101 - 111 mmol/L   CO2 27 22 - 32 mmol/L   Glucose, Bld 127 (H) 65 - 99 mg/dL   BUN 20 6 - 20 mg/dL   Creatinine, Ser 0.70 0.44 - 1.00 mg/dL   Calcium 9.1 8.9 - 10.3 mg/dL   Total Protein 6.9 6.5 - 8.1 g/dL   Albumin 3.9 3.5 - 5.0 g/dL   AST 13 (  L) 15 - 41 U/L   ALT 15 14  - 54 U/L   Alkaline Phosphatase 81 38 - 126 U/L   Total Bilirubin 0.5 0.3 - 1.2 mg/dL   GFR calc non Af Amer >60 >60 mL/min   GFR calc Af Amer >60 >60 mL/min   Anion gap 6 5 - 15  CBC with Differential  Result Value Ref Range   WBC 21.1 (H) 4.0 - 10.5 K/uL   RBC 3.68 (L) 3.87 - 5.11 MIL/uL   Hemoglobin 10.4 (L) 12.0 - 15.0 g/dL   HCT 32.6 (L) 36.0 - 46.0 %   MCV 88.6 78.0 - 100.0 fL   MCH 28.3 26.0 - 34.0 pg   MCHC 31.9 30.0 - 36.0 g/dL   RDW 14.2 11.5 - 15.5 %   Platelets 266 150 - 400 K/uL   Neutrophils Relative % 83 (H) 43 - 77 %   Neutro Abs 17.6 (H) 1.7 - 7.7 K/uL   Lymphocytes Relative 9 (L) 12 - 46 %   Lymphs Abs 1.9 0.7 - 4.0 K/uL   Monocytes Relative 6 3 - 12 %   Monocytes Absolute 1.2 (H) 0.1 - 1.0 K/uL   Eosinophils Relative 1 0 - 5 %   Eosinophils Absolute 0.3 0.0 - 0.7 K/uL   Basophils Relative 1 0 - 1 %   Basophils Absolute 0.1 0.0 - 0.1 K/uL   WBC Morphology TOXIC GRANULATION    .Laboratory interpretation all normal except leukocytosis, stable mild anemia     Imaging Review Dg Chest 2 View  04/05/2015   CLINICAL DATA:  Cough with right-sided chest pain.  EXAM: CHEST  2 VIEW  COMPARISON:  Earlier this day at 2256 hour, 03/23/2015  FINDINGS: Tip of the left chest port remains in the SVC. The cardiomediastinal contours are normal. Pulmonary vasculature is normal. No consolidation, pleural effusion, or pneumothorax. There is degenerative change in the thoracic spine. No acute osseous abnormalities are seen.  IMPRESSION: No acute process.   Electronically Signed   By: Jeb Levering M.D.   On: 04/05/2015 00:50   Ct Angio Chest Pe W/cm &/or Wo Cm  04/05/2015   CLINICAL DATA:  Cough, pleuritic chest pain. History of right-sided breast cancer with ongoing chemotherapy.  EXAM: CT ANGIOGRAPHY CHEST WITH CONTRAST  TECHNIQUE: Multidetector CT imaging of the chest was performed using the standard protocol during bolus administration of intravenous contrast. Multiplanar CT  image reconstructions and MIPs were obtained to evaluate the vascular anatomy.  CONTRAST:  141mL OMNIPAQUE IOHEXOL 350 MG/ML SOLN  COMPARISON:  Chest radiographs earlier this day.  FINDINGS: There are no filling defects in the central pulmonary arteries to suggest pulmonary embolus. The subsegmental branches particularly in the lower lobes are suboptimally assessed due to contrast bolus timing.  The thoracic aorta is normal in caliber with mild atherosclerosis. Left vertebral artery arises directly from the aortic arch, a normal variant.  Heart is at the upper limits of normal in size. There is no pleural or pericardial effusion.  Right chest port tip in the mid SVC. Small mediastinal lymph nodes without pathologic adenopathy.  Patient is post right mastectomy.  No axillary adenopathy.  Mild central bronchial thickening. No consolidation to suggest pneumonia. No pulmonary nodule or mass. Trachea and bronchi are patent.  There is no acute abnormality in the included upper abdomen.  Degenerative change in the spine without acute osseous abnormality. There are no lytic or blastic osseous lesions. Tiny bone islands in the spine, right anterior,  and left posterior ribs.  Review of the MIP images confirms the above findings.  IMPRESSION: 1. No central pulmonary embolus. Subsegmental branches particularly in the lower lobes are suboptimally assessed. 2. Central bronchial thickening suggestive of bronchitis. There is no consolidation or pneumonia.   Electronically Signed   By: Jeb Levering M.D.   On: 04/05/2015 02:52   Dg Chest Port 1 View  04/04/2015   CLINICAL DATA:  Chest pain, cough, and congestion for 1 day.  EXAM: PORTABLE CHEST - 1 VIEW  COMPARISON:  03/23/2015  FINDINGS: Tip of the left chest port in the mid proximal SVC. Lung volumes are low. The cardiomediastinal contours are normal. The lungs are clear. Pulmonary vasculature is normal. No consolidation, pleural effusion, or pneumothorax. No acute osseous  abnormalities are seen.  IMPRESSION: No acute pulmonary process allowing for portable technique.   Electronically Signed   By: Jeb Levering M.D.   On: 04/04/2015 23:42     EKG Interpretation   Date/Time:  Sunday Apr 04 2015 22:52:36 EDT Ventricular Rate:  83 PR Interval:  168 QRS Duration: 86 QT Interval:  401 QTC Calculation: 471 R Axis:   33 Text Interpretation:  Sinus rhythm Borderline T abnormalities, inferior  leads Since last tracing 18 Jan 2014 T wave inversion less evident in  Inferolateral leads Confirmed by Luian Schumpert  MD-I, Nezzie Manera ([id number]) on 04/04/2015  11:30:09 PM      MDM   Final diagnoses:  Bronchitis  Pleuritic chest pain    Discharge Medication List as of 04/05/2015  3:28 AM    START taking these medications   Details  azithromycin (ZITHROMAX) 250 MG tablet Take 2 po the first day then once a day for the next 4 days., Print    dextromethorphan-guaiFENesin (MUCINEX DM) 30-600 MG per 12 hr tablet Take 1 tablet by mouth 2 (two) times daily as needed for cough., Starting 04/05/2015, Until Discontinued, Print    naproxen (NAPROSYN) 375 MG tablet Take 1 po BID with food prn pain, Print        Plan discharge  Rolland Porter, MD, Barbette Or, MD 04/05/15 [phone]

## 2015-04-06 ENCOUNTER — Telehealth: Payer: Self-pay

## 2015-04-06 NOTE — Telephone Encounter (Signed)
Call report rcvd from [facility] - pt sent to ED.  MD notified.  Sent to scan.

## 2015-04-06 NOTE — Telephone Encounter (Signed)
Dispensing order sent to [facility].  Sent to scan.

## 2015-04-14 ENCOUNTER — Ambulatory Visit (HOSPITAL_BASED_OUTPATIENT_CLINIC_OR_DEPARTMENT_OTHER): Payer: BLUE CROSS/BLUE SHIELD

## 2015-04-14 ENCOUNTER — Other Ambulatory Visit: Payer: Self-pay | Admitting: Cardiovascular Disease

## 2015-04-14 ENCOUNTER — Telehealth: Payer: Self-pay | Admitting: Hematology and Oncology

## 2015-04-14 ENCOUNTER — Other Ambulatory Visit (HOSPITAL_BASED_OUTPATIENT_CLINIC_OR_DEPARTMENT_OTHER): Payer: BLUE CROSS/BLUE SHIELD

## 2015-04-14 ENCOUNTER — Encounter: Payer: Self-pay | Admitting: Nurse Practitioner

## 2015-04-14 ENCOUNTER — Ambulatory Visit (HOSPITAL_BASED_OUTPATIENT_CLINIC_OR_DEPARTMENT_OTHER): Payer: BLUE CROSS/BLUE SHIELD | Admitting: Nurse Practitioner

## 2015-04-14 VITALS — BP 126/69 | HR 82 | Temp 97.6°F | Resp 20 | Wt 250.3 lb

## 2015-04-14 DIAGNOSIS — B373 Candidiasis of vulva and vagina: Secondary | ICD-10-CM

## 2015-04-14 DIAGNOSIS — C50411 Malignant neoplasm of upper-outer quadrant of right female breast: Secondary | ICD-10-CM

## 2015-04-14 DIAGNOSIS — M898X9 Other specified disorders of bone, unspecified site: Secondary | ICD-10-CM

## 2015-04-14 DIAGNOSIS — Z5111 Encounter for antineoplastic chemotherapy: Secondary | ICD-10-CM | POA: Diagnosis not present

## 2015-04-14 DIAGNOSIS — Z95828 Presence of other vascular implants and grafts: Secondary | ICD-10-CM

## 2015-04-14 LAB — CBC WITH DIFFERENTIAL/PLATELET
BASO%: 0.1 % (ref 0.0–2.0)
Basophils Absolute: 0 10*3/uL (ref 0.0–0.1)
EOS%: 0 % (ref 0.0–7.0)
Eosinophils Absolute: 0 10*3/uL (ref 0.0–0.5)
HEMATOCRIT: 31.3 % — AB (ref 34.8–46.6)
HEMOGLOBIN: 10.3 g/dL — AB (ref 11.6–15.9)
LYMPH%: 8.5 % — AB (ref 14.0–49.7)
MCH: 28.6 pg (ref 25.1–34.0)
MCHC: 32.9 g/dL (ref 31.5–36.0)
MCV: 86.9 fL (ref 79.5–101.0)
MONO#: 0.6 10*3/uL (ref 0.1–0.9)
MONO%: 5.6 % (ref 0.0–14.0)
NEUT%: 85.8 % — AB (ref 38.4–76.8)
NEUTROS ABS: 8.6 10*3/uL — AB (ref 1.5–6.5)
Platelets: 282 10*3/uL (ref 145–400)
RBC: 3.6 10*6/uL — AB (ref 3.70–5.45)
RDW: 14.9 % — ABNORMAL HIGH (ref 11.2–14.5)
WBC: 10.1 10*3/uL (ref 3.9–10.3)
lymph#: 0.9 10*3/uL (ref 0.9–3.3)

## 2015-04-14 LAB — COMPREHENSIVE METABOLIC PANEL (CC13)
ALBUMIN: 3.6 g/dL (ref 3.5–5.0)
ALK PHOS: 70 U/L (ref 40–150)
ALT: 12 U/L (ref 0–55)
ANION GAP: 7 meq/L (ref 3–11)
AST: 9 U/L (ref 5–34)
BILIRUBIN TOTAL: 0.41 mg/dL (ref 0.20–1.20)
BUN: 14.4 mg/dL (ref 7.0–26.0)
CO2: 26 meq/L (ref 22–29)
CREATININE: 0.8 mg/dL (ref 0.6–1.1)
Calcium: 9.5 mg/dL (ref 8.4–10.4)
Chloride: 112 mEq/L — ABNORMAL HIGH (ref 98–109)
EGFR: 82 mL/min/{1.73_m2} — AB (ref >90)
GLUCOSE: 186 mg/dL — AB (ref 70–140)
Potassium: 4 mEq/L (ref 3.5–5.1)
SODIUM: 145 meq/L (ref 136–145)
TOTAL PROTEIN: 6.4 g/dL (ref 6.4–8.3)

## 2015-04-14 MED ORDER — SODIUM CHLORIDE 0.9 % IV SOLN
Freq: Once | INTRAVENOUS | Status: AC
  Administered 2015-04-14: 11:00:00 via INTRAVENOUS

## 2015-04-14 MED ORDER — PALONOSETRON HCL INJECTION 0.25 MG/5ML
0.2500 mg | Freq: Once | INTRAVENOUS | Status: AC
  Administered 2015-04-14: 0.25 mg via INTRAVENOUS

## 2015-04-14 MED ORDER — SODIUM CHLORIDE 0.9 % IJ SOLN
10.0000 mL | INTRAMUSCULAR | Status: DC | PRN
  Filled 2015-04-14: qty 10

## 2015-04-14 MED ORDER — PEGFILGRASTIM 6 MG/0.6ML ~~LOC~~ PSKT
6.0000 mg | PREFILLED_SYRINGE | Freq: Once | SUBCUTANEOUS | Status: AC
  Administered 2015-04-14: 6 mg via SUBCUTANEOUS
  Filled 2015-04-14: qty 0.6

## 2015-04-14 MED ORDER — SODIUM CHLORIDE 0.9 % IJ SOLN
10.0000 mL | INTRAMUSCULAR | Status: DC | PRN
  Administered 2015-04-14 (×2): 10 mL via INTRAVENOUS
  Filled 2015-04-14: qty 10

## 2015-04-14 MED ORDER — PALONOSETRON HCL INJECTION 0.25 MG/5ML
INTRAVENOUS | Status: AC
  Filled 2015-04-14: qty 5

## 2015-04-14 MED ORDER — SODIUM CHLORIDE 0.9 % IV SOLN
600.0000 mg/m2 | Freq: Once | INTRAVENOUS | Status: AC
  Administered 2015-04-14: 1400 mg via INTRAVENOUS
  Filled 2015-04-14: qty 70

## 2015-04-14 MED ORDER — HEPARIN SOD (PORK) LOCK FLUSH 100 UNIT/ML IV SOLN
500.0000 [IU] | Freq: Once | INTRAVENOUS | Status: AC | PRN
  Administered 2015-04-14: 500 [IU]
  Filled 2015-04-14: qty 5

## 2015-04-14 MED ORDER — SODIUM CHLORIDE 0.9 % IV SOLN
Freq: Once | INTRAVENOUS | Status: AC
  Administered 2015-04-14: 11:00:00 via INTRAVENOUS
  Filled 2015-04-14: qty 1

## 2015-04-14 MED ORDER — DOCETAXEL CHEMO INJECTION 160 MG/16ML
75.0000 mg/m2 | Freq: Once | INTRAVENOUS | Status: AC
  Administered 2015-04-14: 170 mg via INTRAVENOUS
  Filled 2015-04-14: qty 17

## 2015-04-14 NOTE — Telephone Encounter (Signed)
Appointments made and patient will get a new avs in chemo °

## 2015-04-14 NOTE — Progress Notes (Signed)
Patient Care Team: Jonathon Jordan, MD as PCP - General (Family Medicine) Alphonsa Overall, MD as Consulting Physician (General Surgery) Nicholas Lose, MD as Consulting Physician (Hematology and Oncology) Thea Silversmith, MD as Consulting Physician (Radiation Oncology) Mauro Kaufmann, RN as Registered Nurse Rockwell Germany, RN as Registered Nurse Holley Bouche, NP as Nurse Practitioner (Nurse Practitioner) Irene Limbo, MD as Consulting Physician (Plastic Surgery)  DIAGNOSIS: Breast cancer of upper-outer quadrant of right female breast   Staging form: Breast, AJCC 7th Edition     Clinical stage from 12/30/2014: Stage IIA (T2, N0, M0) - Unsigned     Pathologic stage from 02/02/2015: Stage IIA (T2, N0, cM0) - Signed by Enid Cutter, MD on 02/08/2015       Staging comments: Staged on final mastectomy specimen by Dr. Avis Epley.    SUMMARY OF ONCOLOGIC HISTORY:   Breast cancer of upper-outer quadrant of right female breast   12/17/2014 Initial Biopsy Right breast biopsy: Invasive ductal carcinoma with DCIS, right axillary lymph node benign   12/24/2014 Initial Biopsy Right breast core biopsy: At least atypical ductal hyperplasia with associated calcification but findings suspicious for DCIS   12/29/2014 Breast MRI 2.1 cm mass in the upper-outer quadrant of the right breast 2 possible satellite nodules associated with this mass. NME 2.2cm, together 5.1 cm   01/26/2015 Surgery Right breast lumpectomy: 2.2 cm invasive ductal carcinoma with DCIS, grade 2, sentinel lymph node negative, T2 N0 M0 stage II a, ER 100%, PR 0%, HER-2 negative, Ki-67 21%   03/03/2015 -  Chemotherapy Adjuvant chemotherapy with Taxotere Cytoxan 4    CHIEF COMPLIANT: Cycle 3 Taxotere Cytoxan   INTERVAL HISTORY: Mariah Kelley is a 64 year old with above-mentioned history of right-sided breast cancer treated with lumpectomy and is currently receiving adjuvant chemotherapy with Taxotere and Cytoxan. Today she is due for cycle  3 of chemotherapy. She is doing well with few complaints. She denies fevers, chill, nausea, or vomiting. Her appetite is healthy. She uses milk of magnesia PRN for constipation. She had no bone pain with this last neulasta injection while on the claritin.   REVIEW OF SYSTEMS:   Constitutional: Denies fevers, chills or abnormal weight loss Eyes: Denies blurriness of vision Ears, nose, mouth, throat, and face: Denies mucositis or sore throat Respiratory: Denies cough, dyspnea or wheezes Cardiovascular: Denies palpitation, chest discomfort or lower extremity swelling Gastrointestinal:  Denies nausea, heartburn or change in bowel habits Skin: Denies abnormal skin rashes Lymphatics: Denies new lymphadenopathy or easy bruising Neurological:Denies numbness, tingling or new weaknesses Behavioral/Psych: Mood is stable, no new changes  Breast:  denies any pain or lumps or nodules in either breasts All other systems were reviewed with the patient and are negative.  I have reviewed the past medical history, past surgical history, social history and family history with the patient and they are unchanged from previous note.  ALLERGIES:  is allergic to codeine and prednisone.  MEDICATIONS:  Current Outpatient Prescriptions  Medication Sig Dispense Refill  . acidophilus (RISAQUAD) CAPS capsule Take 1 capsule by mouth daily.    [name] aspirin EC 81 MG tablet Take 81 mg by mouth daily.    [name] atorvastatin (LIPITOR) 80 MG tablet TAKE ONE TABLET BY MOUTH ONE TIME DAILY at 6 PM 30 tablet 11  . clopidogrel (PLAVIX) 75 MG tablet Take 1 tablet (75 mg total) by mouth daily. 90 tablet 3  . dexamethasone (DECADRON) 4 MG tablet Take 4 mg by mouth 2 (two) times daily.   0  .  dextromethorphan-guaiFENesin (MUCINEX DM) 30-600 MG per 12 hr tablet Take 1 tablet by mouth 2 (two) times daily as needed for cough. 20 tablet 0  . fluconazole (DIFLUCAN) 100 MG tablet TAKE 1 TABLET (100 MG TOTAL) BY MOUTH DAILY. 10 tablet 0  .  lidocaine-prilocaine (EMLA) cream Apply 1 application topically as needed. For port    . LORazepam (ATIVAN) 0.5 MG tablet Take 1 tablet (0.5 mg total) by mouth every 6 (six) hours as needed (Nausea or vomiting). 30 tablet 0  . metoprolol (LOPRESSOR) 50 MG tablet TAKE ONE TABLET BY MOUTH TWICE DAILY  60 tablet 11  . ondansetron (ZOFRAN) 8 MG tablet Take 1 tablet (8 mg total) by mouth 2 (two) times daily. Start the day after chemo for 3 days. Then take as needed for nausea or vomiting. 30 tablet 1  . prochlorperazine (COMPAZINE) 10 MG tablet Take 1 tablet (10 mg total) by mouth every 6 (six) hours as needed (Nausea or vomiting). 30 tablet 1  . Vitamin D, Ergocalciferol, (DRISDOL) 50000 UNITS CAPS capsule Take 50,000 Units by mouth every 7 (seven) days.    [name] acetaminophen (TYLENOL) 500 MG tablet Take 500 mg by mouth every 6 (six) hours as needed for mild pain.    [name] dexamethasone (DECADRON) 2 MG tablet Take 2 tablets (4 mg total) by mouth 2 (two) times daily. Start the day before Taxotere. Then again the day after chemo for 3 days. (Patient not taking: Reported on 04/14/2015) 30 tablet 0  . HYDROcodone-acetaminophen (NORCO/VICODIN) 5-325 MG per tablet Take 1-2 tablets by mouth every 6 (six) hours as needed. (Patient not taking: Reported on 04/04/2015) 30 tablet 0  . nitroGLYCERIN (NITROSTAT) 0.6 MG SL tablet Place 0.6 mg under the tongue every 5 (five) minutes as needed for chest pain.     No current facility-administered medications for this visit.   Facility-Administered Medications Ordered in Other Visits  Medication Dose Route Frequency Provider Last Rate Last Dose  . sodium chloride 0.9 % injection 10 mL  10 mL Intravenous PRN Nicholas Lose, MD   10 mL at 04/14/15 0941    PHYSICAL EXAMINATION: ECOG PERFORMANCE STATUS: 1 - Symptomatic but completely ambulatory  Filed Vitals:   04/14/15 0956  BP: 126/69  Pulse: 82  Temp: 97.6 F (36.4 C)  Resp: 20   Filed Weights   04/14/15 0956  Weight:  250 lb 4.8 oz (113.535 kg)    GENERAL:alert, no distress and comfortable, alopecia SKIN: skin color, texture, turgor are normal, no rashes or significant lesions EYES: normal, Conjunctiva are pink and non-injected, sclera clear OROPHARYNX:no exudate, no erythema and lips, buccal mucosa, and tongue normal  NECK: supple, thyroid normal size, non-tender, without nodularity LYMPH:  no palpable lymphadenopathy in the cervical, axillary or inguinal LUNGS: clear to auscultation and percussion with normal breathing effort HEART: regular rate & rhythm and no murmurs and no lower extremity edema ABDOMEN:abdomen soft, non-tender and normal bowel sounds Musculoskeletal:no cyanosis of digits and no clubbing  NEURO: alert & oriented x 3 with fluent speech, no focal motor/sensory deficits   LABORATORY DATA:  I have reviewed the data as listed   Chemistry      Component Value Date/Time   NA 145 04/14/2015 0921   NA 139 04/05/2015 0021   K 4.0 04/14/2015 0921   K 4.1 04/05/2015 0021   CL 106 04/05/2015 0021   CO2 26 04/14/2015 0921   CO2 27 04/05/2015 0021   BUN 14.4 04/14/2015 0921   BUN 20  04/05/2015 0021   CREATININE 0.8 04/14/2015 0921   CREATININE 0.70 04/05/2015 0021      Component Value Date/Time   CALCIUM 9.5 04/14/2015 0921   CALCIUM 9.1 04/05/2015 0021   ALKPHOS 70 04/14/2015 0921   ALKPHOS 81 04/05/2015 0021   AST 9 04/14/2015 0921   AST 13* 04/05/2015 0021   ALT 12 04/14/2015 0921   ALT 15 04/05/2015 0021   BILITOT 0.41 04/14/2015 0921   BILITOT 0.5 04/05/2015 0021       Lab Results  Component Value Date   WBC 10.1 04/14/2015   HGB 10.3* 04/14/2015   HCT 31.3* 04/14/2015   MCV 86.9 04/14/2015   PLT 282 04/14/2015   NEUTROABS 8.6* 04/14/2015     RADIOGRAPHIC STUDIES: I have personally reviewed the radiology reports and agreed with their findings. No results found.   ASSESSMENT & PLAN:  Breast cancer of upper-outer quadrant of right female breast Right breast  mastectomy 01/27/2015: 2.2 cm grade 2 invasive ductal carcinoma ER 100%, PR 0%, HER-2 negative, Ki-67 21%, margins negative, sentinel lymph nodes negative Oncotype score 25; 16 % risk of recurrence with tamoxifen alone  Treatment plan: Taxotere Cytoxan every 3 weeks 4 Current treatment: Taxotere Cytoxan cycle 3 day 1  Chemotherapy toxicities: 1. Neulasta related bone pain: I instructed her take Claritin-D every day for 7 days with next chemotherapy 2. Vaginal candidiasis: Prescribed her Diflucan instructed her to hold Lipitor while she is on Diflucan because of interactions. 3. Difficulty sleeping due to steroids: steroid dose reduced 4. Alopecia  Because of difficulty with venous access, she underwent a port placement yesterday. Decreased the steroid dose  Monitoring closely for chemotherapy toxicities Return to clinic in 3 weeks for cycle 4.   No orders of the defined types were placed in this encounter.   The patient has a good understanding of the overall plan. she agrees with it. she will call with any problems that may develop before the next visit here.   Laurie Panda, NP

## 2015-04-14 NOTE — Patient Instructions (Signed)
[place] Discharge Instructions for Patients Receiving Chemotherapy  Today you received the following chemotherapy agents Cytoxan and Taxotere.  To help prevent nausea and vomiting after your treatment, we encourage you to take your nausea medication as prescribed. If you develop nausea and vomiting that is not controlled by your nausea medication, call the clinic.   BELOW ARE SYMPTOMS THAT SHOULD BE REPORTED IMMEDIATELY:  *FEVER GREATER THAN 100.5 F  *CHILLS WITH OR WITHOUT FEVER  NAUSEA AND VOMITING THAT IS NOT CONTROLLED WITH YOUR NAUSEA MEDICATION  *UNUSUAL SHORTNESS OF BREATH  *UNUSUAL BRUISING OR BLEEDING  TENDERNESS IN MOUTH AND THROAT WITH OR WITHOUT PRESENCE OF ULCERS  *URINARY PROBLEMS  *BOWEL PROBLEMS  UNUSUAL RASH Items with * indicate a potential emergency and should be followed up as soon as possible.  Feel free to call the clinic you have any questions or concerns. The clinic phone number is [phone].  Please show the [place] at check-in to the Emergency Department and triage nurse.  Nausea and Vomiting Nausea is a sick feeling that often comes before throwing up (vomiting). Vomiting is a reflex where stomach contents come out of your mouth. Vomiting can cause severe loss of body fluids (dehydration). Children and elderly adults can become dehydrated quickly, especially if they also have diarrhea. Nausea and vomiting are symptoms of a condition or disease. It is important to find the cause of your symptoms. CAUSES   Direct irritation of the stomach lining. This irritation can result from increased acid production (gastroesophageal reflux disease), infection, food poisoning, taking certain medicines (such as nonsteroidal anti-inflammatory drugs), alcohol use, or tobacco use.  Signals from the brain.These signals could be caused by a headache, heat exposure, an inner ear disturbance, increased pressure in the brain from injury,  infection, a tumor, or a concussion, pain, emotional stimulus, or metabolic problems.  An obstruction in the gastrointestinal tract (bowel obstruction).  Illnesses such as diabetes, hepatitis, gallbladder problems, appendicitis, kidney problems, cancer, sepsis, atypical symptoms of a heart attack, or eating disorders.  Medical treatments such as chemotherapy and radiation.  Receiving medicine that makes you sleep (general anesthetic) during surgery. DIAGNOSIS Your caregiver may ask for tests to be done if the problems do not improve after a few days. Tests may also be done if symptoms are severe or if the reason for the nausea and vomiting is not clear. Tests may include:  Urine tests.  Blood tests.  Stool tests.  Cultures (to look for evidence of infection).  X-rays or other imaging studies. Test results can help your caregiver make decisions about treatment or the need for additional tests. TREATMENT You need to stay well hydrated. Drink frequently but in small amounts.You may wish to drink water, sports drinks, clear broth, or eat frozen ice pops or gelatin dessert to help stay hydrated.When you eat, eating slowly may help prevent nausea.There are also some antinausea medicines that may help prevent nausea. HOME CARE INSTRUCTIONS   Take all medicine as directed by your caregiver.  If you do not have an appetite, do not force yourself to eat. However, you must continue to drink fluids.  If you have an appetite, eat a normal diet unless your caregiver tells you differently.  Eat a variety of complex carbohydrates (rice, wheat, potatoes, bread), lean meats, yogurt, fruits, and vegetables.  Avoid high-fat foods because they are more difficult to digest.  Drink enough water and fluids to keep your urine clear or pale yellow.  If you  are dehydrated, ask your caregiver for specific rehydration instructions. Signs of dehydration may include:  Severe thirst.  Dry lips and  mouth.  Dizziness.  Dark urine.  Decreasing urine frequency and amount.  Confusion.  Rapid breathing or pulse. SEEK IMMEDIATE MEDICAL CARE IF:   You have blood or brown flecks (like coffee grounds) in your vomit.  You have black or bloody stools.  You have a severe headache or stiff neck.  You are confused.  You have severe abdominal pain.  You have chest pain or trouble breathing.  You do not urinate at least once every 8 hours.  You develop cold or clammy skin.  You continue to vomit for longer than 24 to 48 hours.  You have a fever. MAKE SURE YOU:   Understand these instructions.  Will watch your condition.  Will get help right away if you are not doing well or get worse. Document Released: 10/23/2005 Document Revised: 01/15/2012 Document Reviewed: 03/22/2011 [facility] Patient Information 2015 [place], [place]. This information is not intended to replace advice given to you by your health care provider. Make sure you discuss any questions you have with your health care provider.

## 2015-04-14 NOTE — Telephone Encounter (Signed)
°  1. Which medications need to be refilled? Nitrostat  2. Which pharmacy is medication to be sent to?[phone] 3. Do they need a 30 day or 90 day supply? 1 bottle  4. Would they like a call back once the medication has been sent to the pharmacy? no

## 2015-04-14 NOTE — Patient Instructions (Signed)

## 2015-04-14 NOTE — Telephone Encounter (Signed)
LMTCB regarding refill  Patient not at work VM said name was "Peg" so generic VM was left

## 2015-04-15 MED ORDER — NITROGLYCERIN 0.4 MG SL SUBL: 0.4000 mg | SUBLINGUAL_TABLET | SUBLINGUAL | Status: DC | PRN

## 2015-04-15 NOTE — Telephone Encounter (Signed)
Returning your call. °

## 2015-04-15 NOTE — Telephone Encounter (Signed)
Clarified dose - patient has outdated Rx for NTG SL 0.4mg  - she is NOT having chest pain  Rx(s) sent to pharmacy electronically.

## 2015-05-03 ENCOUNTER — Other Ambulatory Visit: Payer: Self-pay

## 2015-05-03 NOTE — Assessment & Plan Note (Signed)
Right breast mastectomy 01/27/2015: 2.2 cm grade 2 invasive ductal carcinoma ER 100%, PR 0%, HER-2 negative, Ki-67 21%, margins negative, sentinel lymph nodes negative Oncotype score 25; 16 % risk of recurrence with tamoxifen alone  Treatment plan: Taxotere Cytoxan every 3 weeks 4 Current treatment: Taxotere Cytoxan cycle 4 day 1  Chemotherapy toxicities: 1. Neulasta related bone pain: I instructed her take Claritin-D every day for 7 days with next chemotherapy 2. Vaginal candidiasis: Prescribed her Diflucan instructed her to hold Lipitor while she is on Diflucan because of interactions. 3. Difficulty sleeping due to steroids: steroid dose reduced 4. Alopecia  Monitoring closely for chemotherapy toxicities  Recommendation: 1. Following chemo, Anti-estrogen therapy with anastrozole 1 mg daily X 5 years We discussed the risks and benefits of anti-estrogen therapy with aromatase inhibitors. These include but not limited to insomnia, hot flashes, mood changes, vaginal dryness, bone density loss, and weight gain. Although rare, serious side effects including endometrial cancer, risk of blood clots were also discussed. We strongly believe that the benefits far outweigh the risks. Patient understands these risks and consented to starting treatment. Planned treatment duration is 5 years.  Start anti-estrogen therapy in 1 month PALLAS clinical trial: PALLAS clinical trial counseling: Patients who have completed definitive therapy for breast cancer are randomized to antiestrogen therapy (5+ years) versus antiestrogen therapy plus Palbociclib (2 years). Palbociclib: If she was randomized to Palbociclib, I discussed the risks and benefits of Ibrance including myelosuppression especially neutropenia and with that risk of infection, there is risk of pulmonary embolism and mild peripheral neuropathy as well. Fatigue, nausea, diarrhea, decreased appetite as well as alopecia and thrombocytopenia are also  potential side effects of Palbociclib.  RTC 1 month after starting anti-estrogen therapy

## 2015-05-04 ENCOUNTER — Encounter: Payer: Self-pay | Admitting: Hematology and Oncology

## 2015-05-04 ENCOUNTER — Other Ambulatory Visit (HOSPITAL_BASED_OUTPATIENT_CLINIC_OR_DEPARTMENT_OTHER): Payer: BLUE CROSS/BLUE SHIELD

## 2015-05-04 ENCOUNTER — Telehealth: Payer: Self-pay | Admitting: Hematology and Oncology

## 2015-05-04 ENCOUNTER — Ambulatory Visit (HOSPITAL_BASED_OUTPATIENT_CLINIC_OR_DEPARTMENT_OTHER): Payer: BLUE CROSS/BLUE SHIELD

## 2015-05-04 ENCOUNTER — Ambulatory Visit: Payer: BLUE CROSS/BLUE SHIELD

## 2015-05-04 ENCOUNTER — Ambulatory Visit (HOSPITAL_BASED_OUTPATIENT_CLINIC_OR_DEPARTMENT_OTHER): Payer: BLUE CROSS/BLUE SHIELD | Admitting: Hematology and Oncology

## 2015-05-04 VITALS — BP 149/64 | HR 84 | Temp 98.6°F | Resp 16 | Ht 69.0 in | Wt 252.6 lb

## 2015-05-04 DIAGNOSIS — M898X9 Other specified disorders of bone, unspecified site: Secondary | ICD-10-CM | POA: Diagnosis not present

## 2015-05-04 DIAGNOSIS — N9489 Other specified conditions associated with female genital organs and menstrual cycle: Secondary | ICD-10-CM | POA: Diagnosis not present

## 2015-05-04 DIAGNOSIS — B373 Candidiasis of vulva and vagina: Secondary | ICD-10-CM

## 2015-05-04 DIAGNOSIS — C50411 Malignant neoplasm of upper-outer quadrant of right female breast: Secondary | ICD-10-CM

## 2015-05-04 DIAGNOSIS — Z95828 Presence of other vascular implants and grafts: Secondary | ICD-10-CM

## 2015-05-04 DIAGNOSIS — Z5111 Encounter for antineoplastic chemotherapy: Secondary | ICD-10-CM

## 2015-05-04 LAB — COMPREHENSIVE METABOLIC PANEL (CC13)
ALT: 16 U/L (ref 0–55)
ANION GAP: 6 meq/L (ref 3–11)
AST: 10 U/L (ref 5–34)
Albumin: 3.7 g/dL (ref 3.5–5.0)
Alkaline Phosphatase: 73 U/L (ref 40–150)
BUN: 18.7 mg/dL (ref 7.0–26.0)
CALCIUM: 9.4 mg/dL (ref 8.4–10.4)
CHLORIDE: 110 meq/L — AB (ref 98–109)
CO2: 27 meq/L (ref 22–29)
Creatinine: 0.8 mg/dL (ref 0.6–1.1)
EGFR: 79 mL/min/{1.73_m2} — ABNORMAL LOW (ref >90)
Glucose: 221 mg/dl — ABNORMAL HIGH (ref 70–140)
Potassium: 4.1 mEq/L (ref 3.5–5.1)
SODIUM: 143 meq/L (ref 136–145)
Total Bilirubin: 0.43 mg/dL (ref 0.20–1.20)
Total Protein: 6.3 g/dL — ABNORMAL LOW (ref 6.4–8.3)

## 2015-05-04 LAB — CBC WITH DIFFERENTIAL/PLATELET
BASO%: 0.1 % (ref 0.0–2.0)
BASOS ABS: 0 10*3/uL (ref 0.0–0.1)
EOS%: 0 % (ref 0.0–7.0)
Eosinophils Absolute: 0 10*3/uL (ref 0.0–0.5)
HCT: 31.1 % — ABNORMAL LOW (ref 34.8–46.6)
HEMOGLOBIN: 10.2 g/dL — AB (ref 11.6–15.9)
LYMPH#: 0.7 10*3/uL — AB (ref 0.9–3.3)
LYMPH%: 7 % — ABNORMAL LOW (ref 14.0–49.7)
MCH: 29.1 pg (ref 25.1–34.0)
MCHC: 32.8 g/dL (ref 31.5–36.0)
MCV: 88.9 fL (ref 79.5–101.0)
MONO#: 0.5 10*3/uL (ref 0.1–0.9)
MONO%: 5.1 % (ref 0.0–14.0)
NEUT#: 9 10*3/uL — ABNORMAL HIGH (ref 1.5–6.5)
NEUT%: 87.8 % — ABNORMAL HIGH (ref 38.4–76.8)
Platelets: 269 10*3/uL (ref 145–400)
RBC: 3.5 10*6/uL — AB (ref 3.70–5.45)
RDW: 15.9 % — AB (ref 11.2–14.5)
WBC: 10.2 10*3/uL (ref 3.9–10.3)

## 2015-05-04 MED ORDER — ONDANSETRON HCL 8 MG PO TABS: 8.0000 mg | ORAL_TABLET | Freq: Two times a day (BID) | ORAL | Status: DC

## 2015-05-04 MED ORDER — HEPARIN SOD (PORK) LOCK FLUSH 100 UNIT/ML IV SOLN
500.0000 [IU] | Freq: Once | INTRAVENOUS | Status: AC | PRN
  Administered 2015-05-04: 500 [IU]
  Filled 2015-05-04: qty 5

## 2015-05-04 MED ORDER — SODIUM CHLORIDE 0.9 % IJ SOLN
10.0000 mL | INTRAMUSCULAR | Status: DC | PRN
  Administered 2015-05-04: 10 mL
  Filled 2015-05-04: qty 10

## 2015-05-04 MED ORDER — SODIUM CHLORIDE 0.9 % IV SOLN
Freq: Once | INTRAVENOUS | Status: AC
  Administered 2015-05-04: 10:00:00 via INTRAVENOUS
  Filled 2015-05-04: qty 1

## 2015-05-04 MED ORDER — PROCHLORPERAZINE MALEATE 10 MG PO TABS: 10.0000 mg | ORAL_TABLET | Freq: Four times a day (QID) | ORAL | Status: DC | PRN

## 2015-05-04 MED ORDER — ANASTROZOLE 1 MG PO TABS: 1.0000 mg | ORAL_TABLET | Freq: Every day | ORAL | Status: DC

## 2015-05-04 MED ORDER — PALONOSETRON HCL INJECTION 0.25 MG/5ML
INTRAVENOUS | Status: AC
  Filled 2015-05-04: qty 5

## 2015-05-04 MED ORDER — SODIUM CHLORIDE 0.9 % IV SOLN
Freq: Once | INTRAVENOUS | Status: AC
  Administered 2015-05-04: 10:00:00 via INTRAVENOUS

## 2015-05-04 MED ORDER — PALONOSETRON HCL INJECTION 0.25 MG/5ML
0.2500 mg | Freq: Once | INTRAVENOUS | Status: AC
  Administered 2015-05-04: 0.25 mg via INTRAVENOUS

## 2015-05-04 MED ORDER — SODIUM CHLORIDE 0.9 % IV SOLN
600.0000 mg/m2 | Freq: Once | INTRAVENOUS | Status: AC
  Administered 2015-05-04: 1400 mg via INTRAVENOUS
  Filled 2015-05-04: qty 70

## 2015-05-04 MED ORDER — PEGFILGRASTIM 6 MG/0.6ML ~~LOC~~ PSKT
6.0000 mg | PREFILLED_SYRINGE | Freq: Once | SUBCUTANEOUS | Status: AC
  Administered 2015-05-04: 6 mg via SUBCUTANEOUS
  Filled 2015-05-04: qty 0.6

## 2015-05-04 MED ORDER — LORAZEPAM 0.5 MG PO TABS: 0.5000 mg | ORAL_TABLET | Freq: Four times a day (QID) | ORAL | Status: DC | PRN

## 2015-05-04 MED ORDER — DOCETAXEL CHEMO INJECTION 160 MG/16ML
75.0000 mg/m2 | Freq: Once | INTRAVENOUS | Status: AC
  Administered 2015-05-04: 170 mg via INTRAVENOUS
  Filled 2015-05-04: qty 17

## 2015-05-04 MED ORDER — SODIUM CHLORIDE 0.9 % IJ SOLN
10.0000 mL | INTRAMUSCULAR | Status: DC | PRN
  Administered 2015-05-04: 10 mL via INTRAVENOUS
  Filled 2015-05-04: qty 10

## 2015-05-04 NOTE — Patient Instructions (Signed)
[place] Discharge Instructions for Patients Receiving Chemotherapy  Today you received the following chemotherapy agents: Taxotere, Cytoxan  To help prevent nausea and vomiting after your treatment, we encourage you to take your nausea medication as directed.    If you develop nausea and vomiting that is not controlled by your nausea medication, call the clinic.   BELOW ARE SYMPTOMS THAT SHOULD BE REPORTED IMMEDIATELY:  *FEVER GREATER THAN 100.5 F  *CHILLS WITH OR WITHOUT FEVER  NAUSEA AND VOMITING THAT IS NOT CONTROLLED WITH YOUR NAUSEA MEDICATION  *UNUSUAL SHORTNESS OF BREATH  *UNUSUAL BRUISING OR BLEEDING  TENDERNESS IN MOUTH AND THROAT WITH OR WITHOUT PRESENCE OF ULCERS  *URINARY PROBLEMS  *BOWEL PROBLEMS  UNUSUAL RASH Items with * indicate a potential emergency and should be followed up as soon as possible.  Feel free to call the clinic you have any questions or concerns. The clinic phone number is [phone].  Please show the [place] at check-in to the Emergency Department and triage nurse.   Pegfilgrastim injection What is this medicine? PEGFILGRASTIM (peg fil GRA stim) is a long-acting granulocyte colony-stimulating factor that stimulates the growth of neutrophils, a type of white blood cell important in the body's fight against infection. It is used to reduce the incidence of fever and infection in patients with certain types of cancer who are receiving chemotherapy that affects the bone marrow. This medicine may be used for other purposes; ask your health care provider or pharmacist if you have questions. COMMON BRAND NAME(S): Neulasta What should I tell my health care provider before I take this medicine? They need to know if you have any of these conditions: -latex allergy -ongoing radiation therapy -sickle cell disease -skin reactions to acrylic adhesives (On-Body Injector only) -an unusual or allergic reaction to  pegfilgrastim, filgrastim, other medicines, foods, dyes, or preservatives -pregnant or trying to get pregnant -breast-feeding How should I use this medicine? This medicine is for injection under the skin. If you get this medicine at home, you will be taught how to prepare and give the pre-filled syringe or how to use the On-body Injector. Refer to the patient Instructions for Use for detailed instructions. Use exactly as directed. Take your medicine at regular intervals. Do not take your medicine more often than directed. It is important that you put your used needles and syringes in a special sharps container. Do not put them in a trash can. If you do not have a sharps container, call your pharmacist or healthcare provider to get one. Talk to your pediatrician regarding the use of this medicine in children. Special care may be needed. Overdosage: If you think you have taken too much of this medicine contact a poison control center or emergency room at once. NOTE: This medicine is only for you. Do not share this medicine with others. What if I miss a dose? It is important not to miss your dose. Call your doctor or health care professional if you miss your dose. If you miss a dose due to an On-body Injector failure or leakage, a new dose should be administered as soon as possible using a single prefilled syringe for manual use. What may interact with this medicine? Interactions have not been studied. Give your health care provider a list of all the medicines, herbs, non-prescription drugs, or dietary supplements you use. Also tell them if you smoke, drink alcohol, or use illegal drugs. Some items may interact with your medicine. This list may not  describe all possible interactions. Give your health care provider a list of all the medicines, herbs, non-prescription drugs, or dietary supplements you use. Also tell them if you smoke, drink alcohol, or use illegal drugs. Some items may interact with your  medicine. What should I watch for while using this medicine? You may need blood work done while you are taking this medicine. If you are going to need a MRI, CT scan, or other procedure, tell your doctor that you are using this medicine (On-Body Injector only). What side effects may I notice from receiving this medicine? Side effects that you should report to your doctor or health care professional as soon as possible: -allergic reactions like skin rash, itching or hives, swelling of the face, lips, or tongue -dizziness -fever -pain, redness, or irritation at site where injected -pinpoint red spots on the skin -shortness of breath or breathing problems -stomach or side pain, or pain at the shoulder -swelling -tiredness -trouble passing urine Side effects that usually do not require medical attention (report to your doctor or health care professional if they continue or are bothersome): -bone pain -muscle pain This list may not describe all possible side effects. Call your doctor for medical advice about side effects. You may report side effects to FDA at 1-800-FDA-1088. Where should I keep my medicine? Keep out of the reach of children. Store pre-filled syringes in a refrigerator between 2 and 8 degrees C (36 and 46 degrees F). Do not freeze. Keep in carton to protect from light. Throw away this medicine if it is left out of the refrigerator for more than 48 hours. Throw away any unused medicine after the expiration date. NOTE: This sheet is a summary. It may not cover all possible information. If you have questions about this medicine, talk to your doctor, pharmacist, or health care provider.  2015, Elsevier/Gold Standard. (2014-01-22 16:14:05)

## 2015-05-04 NOTE — Telephone Encounter (Signed)
Appointments made and calendar mailed to patient   anne

## 2015-05-04 NOTE — Progress Notes (Signed)
Patient Care Team: Jonathon Jordan, MD as PCP - General (Family Medicine) Alphonsa Overall, MD as Consulting Physician (General Surgery) Nicholas Lose, MD as Consulting Physician (Hematology and Oncology) Thea Silversmith, MD as Consulting Physician (Radiation Oncology) Mauro Kaufmann, RN as Registered Nurse Rockwell Germany, RN as Registered Nurse Holley Bouche, NP as Nurse Practitioner (Nurse Practitioner) Irene Limbo, MD as Consulting Physician (Plastic Surgery)  DIAGNOSIS: Breast cancer of upper-outer quadrant of right female breast   Staging form: Breast, AJCC 7th Edition     Clinical stage from 12/30/2014: Stage IIA (T2, N0, M0) - Unsigned     Pathologic stage from 02/02/2015: Stage IIA (T2, N0, cM0) - Signed by Enid Cutter, MD on 02/08/2015       Staging comments: Staged on final mastectomy specimen by Dr. Avis Epley.    SUMMARY OF ONCOLOGIC HISTORY:   Breast cancer of upper-outer quadrant of right female breast   12/17/2014 Initial Biopsy Right breast biopsy: Invasive ductal carcinoma with DCIS, right axillary lymph node benign   12/24/2014 Initial Biopsy Right breast core biopsy: At least atypical ductal hyperplasia with associated calcification but findings suspicious for DCIS   12/29/2014 Breast MRI 2.1 cm mass in the upper-outer quadrant of the right breast 2 possible satellite nodules associated with this mass. NME 2.2cm, together 5.1 cm   01/26/2015 Surgery Right breast lumpectomy: 2.2 cm invasive ductal carcinoma with DCIS, grade 2, sentinel lymph node negative, T2 N0 M0 stage II a, ER 100%, PR 0%, HER-2 negative, Ki-67 21%   03/03/2015 -  Chemotherapy Adjuvant chemotherapy with Taxotere Cytoxan 4    CHIEF COMPLIANT: Cycle 4 Taxotere Cytoxan  INTERVAL HISTORY: Mariah Kelley is a 64 year old with above-mentioned history of right breast cancer treated with lumpectomy and adjuvant chemotherapy and is here today to receive her fourth cycle of Taxotere Cytoxan. She has felt  reasonably well after last cycle of chemotherapy. Her major complaint is sharp pains in the genital area especially when she goes to urination.  REVIEW OF SYSTEMS:   Constitutional: Denies fevers, chills or abnormal weight loss Eyes: Denies blurriness of vision Ears, nose, mouth, throat, and face: Denies mucositis or sore throat Respiratory: Denies cough, dyspnea or wheezes Cardiovascular: Denies palpitation, chest discomfort or lower extremity swelling Gastrointestinal:  Denies nausea, heartburn or change in bowel habits, genitourinary pain Skin: Denies abnormal skin rashes Lymphatics: Denies new lymphadenopathy or easy bruising Neurological:Denies numbness, tingling or new weaknesses Behavioral/Psych: Mood is stable, no new changes  All other systems were reviewed with the patient and are negative.  I have reviewed the past medical history, past surgical history, social history and family history with the patient and they are unchanged from previous note.  ALLERGIES:  is allergic to codeine and prednisone.  MEDICATIONS:  Current Outpatient Prescriptions  Medication Sig Dispense Refill  . acetaminophen (TYLENOL) 500 MG tablet Take 500 mg by mouth every 6 (six) hours as needed for mild pain.    [name] acidophilus (RISAQUAD) CAPS capsule Take 1 capsule by mouth daily.    [name] aspirin EC 81 MG tablet Take 81 mg by mouth daily.    [name] atorvastatin (LIPITOR) 80 MG tablet TAKE ONE TABLET BY MOUTH ONE TIME DAILY at 6 PM 30 tablet 11  . cholecalciferol (VITAMIN D) 1000 UNITS tablet Take 2,000 Units by mouth daily.    . clopidogrel (PLAVIX) 75 MG tablet Take 1 tablet (75 mg total) by mouth daily. 90 tablet 3  . dexamethasone (DECADRON) 2 MG tablet Take 2 tablets (  4 mg total) by mouth 2 (two) times daily. Start the day before Taxotere. Then again the day after chemo for 3 days. 30 tablet 0  . dexamethasone (DECADRON) 4 MG tablet Take 4 mg by mouth 2 (two) times daily.   0  . fluconazole (DIFLUCAN) 100 MG  tablet TAKE 1 TABLET (100 MG TOTAL) BY MOUTH DAILY. 10 tablet 0  . lidocaine-prilocaine (EMLA) cream Apply 1 application topically as needed. For port    . LORazepam (ATIVAN) 0.5 MG tablet Take 1 tablet (0.5 mg total) by mouth every 6 (six) hours as needed (Nausea or vomiting). 30 tablet 0  . metoprolol (LOPRESSOR) 50 MG tablet TAKE ONE TABLET BY MOUTH TWICE DAILY  60 tablet 11  . ondansetron (ZOFRAN) 8 MG tablet Take 1 tablet (8 mg total) by mouth 2 (two) times daily. Start the day after chemo for 3 days. Then take as needed for nausea or vomiting. 30 tablet 1  . prochlorperazine (COMPAZINE) 10 MG tablet Take 1 tablet (10 mg total) by mouth every 6 (six) hours as needed (Nausea or vomiting). 30 tablet 1  . anastrozole (ARIMIDEX) 1 MG tablet Take 1 tablet (1 mg total) by mouth daily. 90 tablet 3  . nitroGLYCERIN (NITROSTAT) 0.4 MG SL tablet Place 1 tablet (0.4 mg total) under the tongue every 5 (five) minutes as needed for chest pain. (Patient not taking: Reported on 05/04/2015) 25 tablet 3   No current facility-administered medications for this visit.   Facility-Administered Medications Ordered in Other Visits  Medication Dose Route Frequency Provider Last Rate Last Dose  . sodium chloride 0.9 % injection 10 mL  10 mL Intracatheter PRN Nicholas Lose, MD   10 mL at 05/04/15 1236    PHYSICAL EXAMINATION: ECOG PERFORMANCE STATUS: 1 - Symptomatic but completely ambulatory  Filed Vitals:   05/04/15 0917  BP: 149/64  Pulse: 84  Temp: 98.6 F (37 C)  Resp: 16   Filed Weights   05/04/15 0917  Weight: 252 lb 9.6 oz (114.579 kg)    GENERAL:alert, no distress and comfortable SKIN: skin color, texture, turgor are normal, no rashes or significant lesions EYES: normal, Conjunctiva are pink and non-injected, sclera clear OROPHARYNX:no exudate, no erythema and lips, buccal mucosa, and tongue normal  NECK: supple, thyroid normal size, non-tender, without nodularity LYMPH:  no palpable  lymphadenopathy in the cervical, axillary or inguinal LUNGS: clear to auscultation and percussion with normal breathing effort HEART: regular rate & rhythm and no murmurs and no lower extremity edema ABDOMEN:abdomen soft, non-tender and normal bowel sounds Musculoskeletal:no cyanosis of digits and no clubbing  NEURO: alert & oriented x 3 with fluent speech, no focal motor/sensory deficits   LABORATORY DATA:  I have reviewed the data as listed   Chemistry      Component Value Date/Time   NA 143 05/04/2015 0855   NA 139 04/05/2015 0021   K 4.1 05/04/2015 0855   K 4.1 04/05/2015 0021   CL 106 04/05/2015 0021   CO2 27 05/04/2015 0855   CO2 27 04/05/2015 0021   BUN 18.7 05/04/2015 0855   BUN 20 04/05/2015 0021   CREATININE 0.8 05/04/2015 0855   CREATININE 0.70 04/05/2015 0021      Component Value Date/Time   CALCIUM 9.4 05/04/2015 0855   CALCIUM 9.1 04/05/2015 0021   ALKPHOS 73 05/04/2015 0855   ALKPHOS 81 04/05/2015 0021   AST 10 05/04/2015 0855   AST 13* 04/05/2015 0021   ALT 16 05/04/2015 0855  ALT 15 04/05/2015 0021   BILITOT 0.43 05/04/2015 0855   BILITOT 0.5 04/05/2015 0021       Lab Results  Component Value Date   WBC 10.2 05/04/2015   HGB 10.2* 05/04/2015   HCT 31.1* 05/04/2015   MCV 88.9 05/04/2015   PLT 269 05/04/2015   NEUTROABS 9.0* 05/04/2015    ASSESSMENT & PLAN:  Breast cancer of upper-outer quadrant of right female breast Right breast mastectomy 01/27/2015: 2.2 cm grade 2 invasive ductal carcinoma ER 100%, PR 0%, HER-2 negative, Ki-67 21%, margins negative, sentinel lymph nodes negative Oncotype score 25; 16 % risk of recurrence with tamoxifen alone  Treatment plan: Taxotere Cytoxan every 3 weeks 4 Current treatment: Taxotere Cytoxan cycle 4 day 1  Chemotherapy toxicities: 1. Neulasta related bone pain: I instructed her take Claritin-D every day for 7 days with next chemotherapy 2. Vaginal candidiasis: Prescribed her Diflucan instructed her  to hold Lipitor while she is on Diflucan because of interactions. 3. Difficulty sleeping due to steroids: steroid dose reduced 4. Alopecia 5. Genitourinary pain: I discussed with her about using small amount of endocrine in the periurethral tissues to prevent the sharp pains that she experiences with urination. Monitoring closely for chemotherapy toxicities  Recommendation: 1. Following chemo, Anti-estrogen therapy with anastrozole 1 mg daily X 5 years We discussed the risks and benefits of anti-estrogen therapy with aromatase inhibitors. These include but not limited to insomnia, hot flashes, mood changes, vaginal dryness, bone density loss, and weight gain. Although rare, serious side effects including endometrial cancer, risk of blood clots were also discussed. We strongly believe that the benefits far outweigh the risks. Patient understands these risks and consented to starting treatment. Planned treatment duration is 5 years.  Start anti-estrogen therapy in 1 month  RTC 1 month after starting anti-estrogen therapy    Orders Placed This Encounter  Procedures  . CBC with Differential    Standing Status: Future     Number of Occurrences:      Standing Expiration Date: 05/03/2016  . Comprehensive metabolic panel (Cmet) - [facility]    Standing Status: Future     Number of Occurrences:      Standing Expiration Date: 05/03/2016   The patient has a good understanding of the overall plan. she agrees with it. she will call with any problems that may develop before the next visit here.   Rulon Eisenmenger, MD

## 2015-05-04 NOTE — Patient Instructions (Signed)

## 2015-05-05 ENCOUNTER — Ambulatory Visit: Payer: BLUE CROSS/BLUE SHIELD

## 2015-05-07 ENCOUNTER — Telehealth: Payer: Self-pay | Admitting: *Deleted

## 2015-05-07 NOTE — Telephone Encounter (Signed)
Left vm for pt to return call to assess needs after chemotherapy.

## 2015-05-25 ENCOUNTER — Encounter (HOSPITAL_COMMUNITY): Payer: Self-pay

## 2015-07-01 ENCOUNTER — Telehealth: Payer: Self-pay | Admitting: Adult Health

## 2015-07-01 NOTE — Telephone Encounter (Signed)
I left Ms. Horacek a voicemail and asked that she return my call so that we may schedule her survivorship visit, now that she has completed treatment for breast cancer.  I left my direct office number for her to return my call.  We look forward to participating in her care.   Mike Craze, NP [place] [phone]

## 2015-07-05 ENCOUNTER — Ambulatory Visit (HOSPITAL_BASED_OUTPATIENT_CLINIC_OR_DEPARTMENT_OTHER): Payer: BLUE CROSS/BLUE SHIELD | Admitting: Hematology and Oncology

## 2015-07-05 ENCOUNTER — Encounter: Payer: Self-pay | Admitting: Hematology and Oncology

## 2015-07-05 ENCOUNTER — Other Ambulatory Visit (HOSPITAL_BASED_OUTPATIENT_CLINIC_OR_DEPARTMENT_OTHER): Payer: BLUE CROSS/BLUE SHIELD

## 2015-07-05 ENCOUNTER — Telehealth: Payer: Self-pay | Admitting: Hematology and Oncology

## 2015-07-05 VITALS — BP 135/82 | HR 87 | Temp 98.4°F | Resp 18 | Ht 69.0 in | Wt 251.6 lb

## 2015-07-05 DIAGNOSIS — C50411 Malignant neoplasm of upper-outer quadrant of right female breast: Secondary | ICD-10-CM

## 2015-07-05 LAB — COMPREHENSIVE METABOLIC PANEL (CC13)
ALT: 16 U/L (ref 0–55)
AST: 14 U/L (ref 5–34)
Albumin: 4.4 g/dL (ref 3.5–5.0)
Alkaline Phosphatase: 81 U/L (ref 40–150)
Anion Gap: 9 mEq/L (ref 3–11)
BILIRUBIN TOTAL: 0.74 mg/dL (ref 0.20–1.20)
BUN: 27.9 mg/dL — AB (ref 7.0–26.0)
CO2: 28 meq/L (ref 22–29)
Calcium: 10 mg/dL (ref 8.4–10.4)
Chloride: 105 mEq/L (ref 98–109)
Creatinine: 0.8 mg/dL (ref 0.6–1.1)
EGFR: 77 mL/min/{1.73_m2} — AB (ref >90)
Glucose: 107 mg/dl (ref 70–140)
Potassium: 4.3 mEq/L (ref 3.5–5.1)
SODIUM: 142 meq/L (ref 136–145)
TOTAL PROTEIN: 7 g/dL (ref 6.4–8.3)

## 2015-07-05 LAB — CBC WITH DIFFERENTIAL/PLATELET
BASO%: 0.6 % (ref 0.0–2.0)
Basophils Absolute: 0 10*3/uL (ref 0.0–0.1)
EOS%: 3.2 % (ref 0.0–7.0)
Eosinophils Absolute: 0.2 10*3/uL (ref 0.0–0.5)
HCT: 39.8 % (ref 34.8–46.6)
HGB: 13 g/dL (ref 11.6–15.9)
LYMPH%: 17 % (ref 14.0–49.7)
MCH: 28.7 pg (ref 25.1–34.0)
MCHC: 32.7 g/dL (ref 31.5–36.0)
MCV: 87.7 fL (ref 79.5–101.0)
MONO#: 0.4 10*3/uL (ref 0.1–0.9)
MONO%: 7.1 % (ref 0.0–14.0)
NEUT%: 72.1 % (ref 38.4–76.8)
NEUTROS ABS: 3.8 10*3/uL (ref 1.5–6.5)
Platelets: 281 10*3/uL (ref 145–400)
RBC: 4.54 10*6/uL (ref 3.70–5.45)
RDW: 13.6 % (ref 11.2–14.5)
WBC: 5.3 10*3/uL (ref 3.9–10.3)
lymph#: 0.9 10*3/uL (ref 0.9–3.3)

## 2015-07-05 NOTE — Assessment & Plan Note (Signed)
Right breast mastectomy 01/27/2015: 2.2 cm grade 2 invasive ductal carcinoma ER 100%, PR 0%, HER-2 negative, Ki-67 21%, margins negative, sentinel lymph nodes negative Oncotype score 25; 16 % risk of recurrence with tamoxifen alone; status post adjuvant chemotherapy with Taxotere and Cytoxan started 03/03/2015 completed 05/04/2015, started anastrozole 06/07/2015  Anastrozole toxicities:  Breast Cancer Surveillance: 1. Breast exam to be done every 6 months 2. Mammograms to be done annually from June 2017   Return to clinic in 6 months for follow-up

## 2015-07-05 NOTE — Progress Notes (Signed)
Patient Care Team: Jonathon Jordan, MD as PCP - General (Family Medicine) Alphonsa Overall, MD as Consulting Physician (General Surgery) Nicholas Lose, MD as Consulting Physician (Hematology and Oncology) Thea Silversmith, MD as Consulting Physician (Radiation Oncology) Mauro Kaufmann, RN as Registered Nurse Rockwell Germany, RN as Registered Nurse Holley Bouche, NP as Nurse Practitioner (Nurse Practitioner) Irene Limbo, MD as Consulting Physician (Plastic Surgery)  DIAGNOSIS: Breast cancer of upper-outer quadrant of right female breast   Staging form: Breast, AJCC 7th Edition     Clinical stage from 12/30/2014: Stage IIA (T2, N0, M0) - Unsigned     Pathologic stage from 02/02/2015: Stage IIA (T2, N0, cM0) - Signed by Enid Cutter, MD on 02/08/2015       Staging comments: Staged on final mastectomy specimen by Dr. Avis Epley.    SUMMARY OF ONCOLOGIC HISTORY:   Breast cancer of upper-outer quadrant of right female breast   12/17/2014 Initial Biopsy Right breast biopsy: Invasive ductal carcinoma with DCIS, right axillary lymph node benign   12/24/2014 Initial Biopsy Right breast core biopsy: At least atypical ductal hyperplasia with associated calcification but findings suspicious for DCIS   12/29/2014 Breast MRI 2.1 cm mass in the upper-outer quadrant of the right breast 2 possible satellite nodules associated with this mass. NME 2.2cm, together 5.1 cm   01/26/2015 Surgery Right breast mastectomy: 2.2 cm invasive ductal carcinoma with DCIS, grade 2, sentinel lymph node negative, T2 N0 M0 stage II a, ER 100%, PR 0%, HER-2 negative, Ki-67 21%   03/03/2015 - 05/04/2015 Chemotherapy Adjuvant chemotherapy with Taxotere Cytoxan 4   06/04/2015 -  Anti-estrogen oral therapy Anastrozole 1 mg daily 5 years    CHIEF COMPLIANT: Follow-up on anastrozole toxicity check  INTERVAL HISTORY: Mariah Kelley is a 64 year old lady with above-mentioned history of right breast cancer treated with mastectomy and is  now on oral antiestrogen therapy with anastrozole 1 mg daily since 06/04/2015. She is tolerating anastrozole extremely well. She complains of hot flashes. But these are not severe enough to affect her quality of life or physical functioning. She is been very active both at work and at [employer]. She denies any  problems or concerns.  REVIEW OF SYSTEMS:   Constitutional: Denies fevers, chills or abnormal weight loss Eyes: Denies blurriness of vision Ears, nose, mouth, throat, and face: Denies mucositis or sore throat Respiratory: Denies cough, dyspnea or wheezes Cardiovascular: Denies palpitation, chest discomfort or lower extremity swelling Gastrointestinal:  Denies nausea, heartburn or change in bowel habits Skin: Denies abnormal skin rashes Lymphatics: Denies new lymphadenopathy or easy bruising Neurological:Denies numbness, tingling or new weaknesses Behavioral/Psych: Mood is stable, no new changes  Breast:  denies any pain or lumps or nodules in either breasts All other systems were reviewed with the patient and are negative.  I have reviewed the past medical history, past surgical history, social history and family history with the patient and they are unchanged from previous note.  ALLERGIES:  is allergic to codeine and prednisone.  MEDICATIONS:  Current Outpatient Prescriptions  Medication Sig Dispense Refill  . acetaminophen (TYLENOL) 500 MG tablet Take 500 mg by mouth every 6 (six) hours as needed for mild pain.    [name] anastrozole (ARIMIDEX) 1 MG tablet Take 1 tablet (1 mg total) by mouth daily. 90 tablet 3  . aspirin EC 81 MG tablet Take 81 mg by mouth daily.    [name] atorvastatin (LIPITOR) 80 MG tablet TAKE ONE TABLET BY MOUTH ONE TIME DAILY at 6  PM 30 tablet 11  . cholecalciferol (VITAMIN D) 1000 UNITS tablet Take 2,000 Units by mouth daily.    . clopidogrel (PLAVIX) 75 MG tablet Take 1 tablet (75 mg total) by mouth daily. 90 tablet 3  . nitroGLYCERIN (NITROSTAT) 0.4 MG SL tablet  Place 1 tablet (0.4 mg total) under the tongue every 5 (five) minutes as needed for chest pain. (Patient not taking: Reported on 05/04/2015) 25 tablet 3   No current facility-administered medications for this visit.    PHYSICAL EXAMINATION: ECOG PERFORMANCE STATUS: 1 - Symptomatic but completely ambulatory  Filed Vitals:   07/05/15 1022  BP: 135/82  Pulse: 87  Temp: 98.4 F (36.9 C)  Resp: 18   Filed Weights   07/05/15 1022  Weight: 251 lb 9.6 oz (114.125 kg)    GENERAL:alert, no distress and comfortable SKIN: skin color, texture, turgor are normal, no rashes or significant lesions EYES: normal, Conjunctiva are pink and non-injected, sclera clear OROPHARYNX:no exudate, no erythema and lips, buccal mucosa, and tongue normal  NECK: supple, thyroid normal size, non-tender, without nodularity LYMPH:  no palpable lymphadenopathy in the cervical, axillary or inguinal LUNGS: clear to auscultation and percussion with normal breathing effort HEART: regular rate & rhythm and no murmurs and no lower extremity edema ABDOMEN:abdomen soft, non-tender and normal bowel sounds Musculoskeletal:no cyanosis of digits and no clubbing  NEURO: alert & oriented x 3 with fluent speech, no focal motor/sensory deficits  LABORATORY DATA:  I have reviewed the data as listed   Chemistry      Component Value Date/Time   NA 143 05/04/2015 0855   NA 139 04/05/2015 0021   K 4.1 05/04/2015 0855   K 4.1 04/05/2015 0021   CL 106 04/05/2015 0021   CO2 27 05/04/2015 0855   CO2 27 04/05/2015 0021   BUN 18.7 05/04/2015 0855   BUN 20 04/05/2015 0021   CREATININE 0.8 05/04/2015 0855   CREATININE 0.70 04/05/2015 0021      Component Value Date/Time   CALCIUM 9.4 05/04/2015 0855   CALCIUM 9.1 04/05/2015 0021   ALKPHOS 73 05/04/2015 0855   ALKPHOS 81 04/05/2015 0021   AST 10 05/04/2015 0855   AST 13* 04/05/2015 0021   ALT 16 05/04/2015 0855   ALT 15 04/05/2015 0021   BILITOT 0.43 05/04/2015 0855    BILITOT 0.5 04/05/2015 0021       Lab Results  Component Value Date   WBC 5.3 07/05/2015   HGB 13.0 07/05/2015   HCT 39.8 07/05/2015   MCV 87.7 07/05/2015   PLT 281 07/05/2015   NEUTROABS 3.8 07/05/2015   ASSESSMENT & PLAN:  Breast cancer of upper-outer quadrant of right female breast Right breast mastectomy 01/27/2015: 2.2 cm grade 2 invasive ductal carcinoma ER 100%, PR 0%, HER-2 negative, Ki-67 21%, margins negative, sentinel lymph nodes negative Oncotype score 25; 16 % risk of recurrence with tamoxifen alone; status post adjuvant chemotherapy with Taxotere and Cytoxan started 03/03/2015 completed 05/04/2015, started anastrozole 06/07/2015  Anastrozole toxicities: No major side effects anastrozole. 1. Complains of hot flashes quite often throughout the day but it has not been affecting her quality of life. 2. Denies any myalgias.  Survivorship: Patient goes to the [facility] and does water exercises in [place]. She stays very active and busy with her work.  Breast Cancer Surveillance: 1. Breast exam to be done every 6 months 2. Mammograms to be done annually from June 2017   Return to clinic in 6 months for follow-up  No  orders of the defined types were placed in this encounter.   The patient has a good understanding of the overall plan. she agrees with it. she will call with any problems that may develop before the next visit here.   Rulon Eisenmenger, MD

## 2015-07-05 NOTE — Telephone Encounter (Signed)
Gave avs & calendar for February. °

## 2015-07-07 ENCOUNTER — Other Ambulatory Visit: Payer: Self-pay | Admitting: Adult Health

## 2015-07-07 DIAGNOSIS — C50411 Malignant neoplasm of upper-outer quadrant of right female breast: Secondary | ICD-10-CM

## 2015-07-09 ENCOUNTER — Telehealth: Payer: Self-pay | Admitting: Hematology and Oncology

## 2015-07-09 NOTE — Telephone Encounter (Signed)
Aware of survivorship appointment

## 2015-08-04 ENCOUNTER — Telehealth: Payer: Self-pay | Admitting: Hematology and Oncology

## 2015-08-04 NOTE — Telephone Encounter (Signed)
Changed time of 10/7 [place] appointment from PM to AM due to [place]. Left message for patient and mailed schedule.

## 2015-08-13 ENCOUNTER — Encounter: Payer: BLUE CROSS/BLUE SHIELD | Admitting: Nurse Practitioner

## 2015-08-17 ENCOUNTER — Telehealth: Payer: Self-pay | Admitting: *Deleted

## 2015-08-17 NOTE — Telephone Encounter (Signed)
LM to reschedule Survivorship appt missed on Oct 7. Pt may reschedule or we can mail care plan to her. Please let us know.

## 2015-08-25 ENCOUNTER — Encounter: Payer: Self-pay | Admitting: Nurse Practitioner

## 2015-08-25 DIAGNOSIS — C50411 Malignant neoplasm of upper-outer quadrant of right female breast: Secondary | ICD-10-CM

## 2015-08-25 NOTE — Progress Notes (Signed)
The Survivorship Care Plan was mailed to Mariah Kelley as she was unable to come in to the Survivorship Clinic for an in-person visit at this time. A letter was mailed to her outlining the purpose of the content of the care plan, as well as encouraging her to reach out to me with any questions or concerns.  My business card was included in the correspondence to the patient as well.  A copy of the care plan was also routed/faxed/mailed to Mariah Coma, MD, the patient's PCP.  I will not be placing any follow-up appointments to the Survivorship Clinic for Mariah Kelley, but I am happy to see her at any time in the future for any survivorship concerns that may arise. Thank you for allowing me to participate in her care!  Kenn File, [place] [phone]

## 2016-01-02 NOTE — Assessment & Plan Note (Signed)
Right breast mastectomy 01/27/2015: 2.2 cm grade 2 invasive ductal carcinoma ER 100%, PR 0%, HER-2 negative, Ki-67 21%, margins negative, sentinel lymph nodes negative Oncotype score 25; 16 % risk of recurrence with tamoxifen alone; status post adjuvant chemotherapy with Taxotere and Cytoxan started 03/03/2015 completed 05/04/2015, started anastrozole 06/07/2015  Anastrozole toxicities: No major side effects anastrozole. 1. Complains of hot flashes quite often throughout the day but it has not been affecting her quality of life. 2. Denies any myalgias.  Survivorship: Patient goes to the [facility] and does water exercises in [place]. She stays very active and busy with her work.  Breast Cancer Surveillance: 1. Breast exam 01/03/16:  2. Mammograms to be done annually from June 2017   Recommended participation in [facility] Trial with Aspirin. Return to clinic in 6 months for follow-up

## 2016-01-03 ENCOUNTER — Ambulatory Visit (HOSPITAL_BASED_OUTPATIENT_CLINIC_OR_DEPARTMENT_OTHER): Payer: BLUE CROSS/BLUE SHIELD | Admitting: Hematology and Oncology

## 2016-01-03 ENCOUNTER — Encounter: Payer: Self-pay | Admitting: Hematology and Oncology

## 2016-01-03 ENCOUNTER — Telehealth: Payer: Self-pay | Admitting: Hematology and Oncology

## 2016-01-03 VITALS — BP 158/68 | HR 102 | Temp 97.9°F | Resp 19 | Ht 69.0 in | Wt 271.7 lb

## 2016-01-03 DIAGNOSIS — C50411 Malignant neoplasm of upper-outer quadrant of right female breast: Secondary | ICD-10-CM | POA: Diagnosis not present

## 2016-01-03 NOTE — Progress Notes (Signed)
Patient Care Team: Jonathon Jordan, MD as PCP - General (Family Medicine) Alphonsa Overall, MD as Consulting Physician (General Surgery) Nicholas Lose, MD as Consulting Physician (Hematology and Oncology) Thea Silversmith, MD as Consulting Physician (Radiation Oncology) Mauro Kaufmann, RN as Registered Nurse Rockwell Germany, RN as Registered Nurse Holley Bouche, NP as Nurse Practitioner (Nurse Practitioner) Irene Limbo, MD as Consulting Physician (Plastic Surgery) Sylvan Cheese, NP as Nurse Practitioner (Nurse Practitioner)  DIAGNOSIS: Breast cancer of upper-outer quadrant of right female breast [facility])   Staging form: Breast, AJCC 7th Edition     Clinical stage from 12/30/2014: Stage IIA (T2, N0, M0) - Unsigned     Pathologic stage from 02/02/2015: Stage IIA (T2, N0, cM0) - Signed by Enid Cutter, MD on 02/08/2015       Staging comments: Staged on final mastectomy specimen by Dr. Avis Epley.  SUMMARY OF ONCOLOGIC HISTORY:   Breast cancer of upper-outer quadrant of right female breast ([place])   04/29/2014 Procedure Prior right breast bx: fibrocystic changes with calcifications, no evidence of malignancy.   12/11/2014 Mammogram Right breast: focal asymmetric density with indistinct margin at 11:00 middle depth. Also area of grouped heterogenous calcifications at 12:00, middle depth, increased in number.   12/11/2014 Breast US Right breast: 2 cm round mass with irregular margin in UOQ, posterior depth; hypoechoic with posterior acoustic enhancement; lobulated LN with focal cortex thickening in right axilla   12/17/2014 Initial Biopsy Right breast biopsy: Invasive ductal carcinoma with DCIS, right axillary lymph node benign   12/24/2014 Procedure Right breast needle core bx: at least atypical ductal hyperplasia with associated calcifications - suspicious for DCIS   12/29/2014 Breast MRI 2.1 cm mass in the upper-outer quadrant of the right breast 2 possible satellite nodules associated with this  mass. NME 2.2cm, together 5.1 cm   12/29/2014 Clinical Stage Stage IIA: T2 N0   01/27/2015 Definitive Surgery Right breast mastectomy/[facility] Lucia Gaskins): invasive ductal carcinoma, grade 2, with DCIS (int. grade), spanning 2.2 cm, ER+ (100%), PR- (0%,) HER-2 negative (ratio 1.53), Ki-67 21%. 1 LN negative for malignancy (0/1)   01/27/2015 Pathologic Stage Stage IIA: pT2 pN0   01/27/2015 Oncotype testing Score: 25 (16% ROR)   03/03/2015 - 05/04/2015 Chemotherapy Adjuvant chemotherapy with docetaxel and cyclophosphamide 4 (Jessel Gettinger).   06/04/2015 -  Anti-estrogen oral therapy Anastrozole 1 mg daily. Planned duration of therapy 5 years [facility])   08/25/2015 Survivorship Survivorship care plan completed and mailed to patient in lieu of in person visit    CHIEF COMPLIANT: follow-up on anastrozole  INTERVAL HISTORY: Mariah Kelley is a 65 year old with above-mentioned history of right breast cancer currently on antiestrogen therapy with anastrozole. She is tolerating anastrozole extremely well. She does not have any significant hot flashes or myalgias. She stays very busy with her work. She exercises at the [facility] on the weekends.  REVIEW OF SYSTEMS:   Constitutional: Denies fevers, chills or abnormal weight loss Eyes: Denies blurriness of vision Ears, nose, mouth, throat, and face: Denies mucositis or sore throat Respiratory: Denies cough, dyspnea or wheezes Cardiovascular: Denies palpitation, chest discomfort Gastrointestinal:  Denies nausea, heartburn or change in bowel habits Skin: Denies abnormal skin rashes Lymphatics: Denies new lymphadenopathy or easy bruising Neurological:Denies numbness, tingling or new weaknesses Behavioral/Psych: Mood is stable, no new changes  Extremities: No lower extremity edema Breast:  denies any pain or lumps or nodules in either breasts All other systems were reviewed with the patient and are negative.  I have reviewed the past  medical history, past surgical  history, social history and family history with the patient and they are unchanged from previous note.  ALLERGIES:  is allergic to codeine and prednisone.  MEDICATIONS:  Current Outpatient Prescriptions  Medication Sig Dispense Refill  . acetaminophen (TYLENOL) 500 MG tablet Take 500 mg by mouth every 6 (six) hours as needed for mild pain.    [name] anastrozole (ARIMIDEX) 1 MG tablet Take 1 tablet (1 mg total) by mouth daily. 90 tablet 3  . aspirin EC 81 MG tablet Take 81 mg by mouth daily.    [name] atorvastatin (LIPITOR) 80 MG tablet TAKE ONE TABLET BY MOUTH ONE TIME DAILY at 6 PM 30 tablet 11  . cholecalciferol (VITAMIN D) 1000 UNITS tablet Take 2,000 Units by mouth daily.    . clopidogrel (PLAVIX) 75 MG tablet Take 1 tablet (75 mg total) by mouth daily. 90 tablet 3  . metoprolol (LOPRESSOR) 50 MG tablet 50 mg 2 (two) times daily.   10  . nitroGLYCERIN (NITROSTAT) 0.4 MG SL tablet Place 1 tablet (0.4 mg total) under the tongue every 5 (five) minutes as needed for chest pain. (Patient not taking: Reported on 05/04/2015) 25 tablet 3   No current facility-administered medications for this visit.    PHYSICAL EXAMINATION: ECOG PERFORMANCE STATUS: 0 - Asymptomatic  Filed Vitals:   01/03/16 1522  BP: 158/68  Pulse: 102  Temp: 97.9 F (36.6 C)  Resp: 19   Filed Weights   01/03/16 1522  Weight: 271 lb 11.2 oz (123.242 kg)    GENERAL:alert, no distress and comfortable SKIN: skin color, texture, turgor are normal, no rashes or significant lesions EYES: normal, Conjunctiva are pink and non-injected, sclera clear OROPHARYNX:no exudate, no erythema and lips, buccal mucosa, and tongue normal  NECK: supple, thyroid normal size, non-tender, without nodularity LYMPH:  no palpable lymphadenopathy in the cervical, axillary or inguinal LUNGS: clear to auscultation and percussion with normal breathing effort HEART: regular rate & rhythm and no murmurs and no lower extremity edema ABDOMEN:abdomen soft,  non-tender and normal bowel sounds MUSCULOSKELETAL:no cyanosis of digits and no clubbing  NEURO: alert & oriented x 3 with fluent speech, no focal motor/sensory deficits EXTREMITIES: No lower extremity edema   LABORATORY DATA:  I have reviewed the data as listed   Chemistry      Component Value Date/Time   NA 142 07/05/2015 1010   NA 139 04/05/2015 0021   K 4.3 07/05/2015 1010   K 4.1 04/05/2015 0021   CL 106 04/05/2015 0021   CO2 28 07/05/2015 1010   CO2 27 04/05/2015 0021   BUN 27.9* 07/05/2015 1010   BUN 20 04/05/2015 0021   CREATININE 0.8 07/05/2015 1010   CREATININE 0.70 04/05/2015 0021      Component Value Date/Time   CALCIUM 10.0 07/05/2015 1010   CALCIUM 9.1 04/05/2015 0021   ALKPHOS 81 07/05/2015 1010   ALKPHOS 81 04/05/2015 0021   AST 14 07/05/2015 1010   AST 13* 04/05/2015 0021   ALT 16 07/05/2015 1010   ALT 15 04/05/2015 0021   BILITOT 0.74 07/05/2015 1010   BILITOT 0.5 04/05/2015 0021       Lab Results  Component Value Date   WBC 5.3 07/05/2015   HGB 13.0 07/05/2015   HCT 39.8 07/05/2015   MCV 87.7 07/05/2015   PLT 281 07/05/2015   NEUTROABS 3.8 07/05/2015   ASSESSMENT & PLAN:  Breast cancer of upper-outer quadrant of right female breast Right breast mastectomy 01/27/2015: 2.2 cm  grade 2 invasive ductal carcinoma ER 100%, PR 0%, HER-2 negative, Ki-67 21%, margins negative, sentinel lymph nodes negative Oncotype score 25; 16 % risk of recurrence with tamoxifen alone; status post adjuvant chemotherapy with Taxotere and Cytoxan started 03/03/2015 completed 05/04/2015, started anastrozole 06/07/2015  Anastrozole toxicities: No major side effects anastrozole. 1. Complains of hot flashes quite often throughout the day but it has not been affecting her quality of life. 2. Denies any myalgias.  Survivorship: Patient goes to the [facility] and does water exercises in [place]. She stays very active and busy with her work.  Breast Cancer Surveillance: 1.  Breast exam 01/03/16:  2. Mammograms to be done annually from June 2017   Return to clinic in 6 months for follow-up  No orders of the defined types were placed in this encounter.   The patient has a good understanding of the overall plan. she agrees with it. she will call with any problems that may develop before the next visit here.   Rulon Eisenmenger, MD 01/03/2016

## 2016-01-03 NOTE — Telephone Encounter (Signed)
Gave patient avs report and appointment for August  °

## 2016-03-07 ENCOUNTER — Other Ambulatory Visit: Payer: Self-pay | Admitting: Cardiovascular Disease

## 2016-03-07 NOTE — Telephone Encounter (Signed)
REFILL 

## 2016-03-08 ENCOUNTER — Other Ambulatory Visit: Payer: Self-pay | Admitting: Cardiovascular Disease

## 2016-03-08 NOTE — Telephone Encounter (Signed)
REFILL 

## 2016-03-17 ENCOUNTER — Telehealth (HOSPITAL_COMMUNITY): Payer: Self-pay

## 2016-03-17 NOTE — Telephone Encounter (Signed)
Okay to interrupt antiplatelet treatment for colonoscopy

## 2016-03-17 NOTE — Telephone Encounter (Signed)
Requesting surgical clearance:   1. Type of surgery: Colonoscopy  2. Surgeon: Dr. Collene Mares  3. Surgical date: 04/07/2016   4. Medications that need to be held: okay to HOLD Plavix and ASA for 5 days prior  5. CAD: yes     6. I will defer to: [facility]  Attn: Dr. Dione Booze phone: [phone] fax: [phone]

## 2016-03-22 NOTE — Telephone Encounter (Signed)
Pt clearance sent to [facility]

## 2016-04-05 ENCOUNTER — Encounter: Payer: Self-pay | Admitting: Cardiovascular Disease

## 2016-04-05 ENCOUNTER — Ambulatory Visit (INDEPENDENT_AMBULATORY_CARE_PROVIDER_SITE_OTHER): Payer: BLUE CROSS/BLUE SHIELD | Admitting: Cardiovascular Disease

## 2016-04-05 VITALS — BP 157/82 | HR 72 | Ht 68.5 in | Wt 277.0 lb

## 2016-04-05 DIAGNOSIS — I10 Essential (primary) hypertension: Secondary | ICD-10-CM | POA: Diagnosis not present

## 2016-04-05 DIAGNOSIS — E785 Hyperlipidemia, unspecified: Secondary | ICD-10-CM

## 2016-04-05 NOTE — Progress Notes (Signed)
04/05/2016 Mariah Kelley   1951/03/21  [id number]  Primary Physician [name], MD Primary Cardiologist: [name] [place], [place]   HPI:  Mariah Kelley is a 89 .o. female w/ PMHx s/f HTN, HLD, prior tobacco abuse, obesity and family h/o premature CAD I last saw her in the office 01/08/15. She was  admitted to [facility] today for STEMI 01/17/14. She was in her USOH until ~2AM this morning when she experienced sudden onset substernal chest pressure radiating to her neck and arms bilaterally w/ associated nausea and vomiting. The discomfort persisted through the morning. She took an ASA 81mg  ~0600 w/ minimal relief. When the discomfort intensified to a 7-8/10, she sought medical care.  She has no prior cardiac history. She reports smoking 1 PPD x 40 years, no current tobacco abuse. Mother with MI in her 90s, father with MI at 72, brothers w/ CAD. Previously on medication for high cholesterol. No current medications.  In the ED, EKG revealed NSR w/ 5mm ST elevations in II, III, aVF and V5, V6 meeting diagnostic criteria for an inferolateral STEMI. Code STEMI was called, and she was transported emergently to the cardiac cath lab. I placed a drug-eluting stent in her dominant mid RCA for a hazy 80% plaque. The remainder of her coronary anatomy was free of significant disease in her function was preserved. She was discharged in stable condition. Since that time she has seen [place] PA-C in the office in followup. She does deny chest pain or shortness of breath. She has lost 40 pounds as made lifestyle modifications.since I saw her back in the office a year ago she's remained currently stable specifically denying chest pain or shortness of breath.  Shewas diagnosed with breast cancer underwent mastectomy one year ago and chemotherapy subsequent to that. She is currently cancer free although she has gained 20 pounds in the last year.  Current  Outpatient Prescriptions  Medication Sig Dispense Refill  . acetaminophen (TYLENOL) 500 MG tablet Take 500 mg by mouth every 6 (six) hours as needed for mild pain.    [name] anastrozole (ARIMIDEX) 1 MG tablet Take 1 tablet (1 mg total) by mouth daily. 90 tablet 3  . aspirin EC 81 MG tablet Take 81 mg by mouth daily.    [name] atorvastatin (LIPITOR) 80 MG tablet Take 1 tablet (80 mg total) by mouth daily at 6 PM. NEED OV. 30 tablet 0  . cholecalciferol (VITAMIN D) 1000 UNITS tablet Take 2,000 Units by mouth daily.    . clopidogrel (PLAVIX) 75 MG tablet Take 1 tablet (75 mg total) by mouth daily. NEED OV. 90 tablet 0  . metoprolol (LOPRESSOR) 50 MG tablet Take 1 tablet (50 mg total) by mouth 2 (two) times daily. NEED OV. 60 tablet 0  . nitroGLYCERIN (NITROSTAT) 0.4 MG SL tablet Place 1 tablet (0.4 mg total) under the tongue every 5 (five) minutes as needed for chest pain. 25 tablet 3   No current facility-administered medications for this visit.    Allergies  Allergen Reactions  . Codeine     Nausea and headache  . Prednisone Other (See Comments)    "made poison ivy worse", rebound effect when take prednisone    Social History   Social History  . Marital Status: Single    Spouse Name: N/A  . Number of Children: N/A  . Years of Education: N/A   Occupational History  . Social Worker    Social History  Main Topics  . Smoking status: Former Smoker -- 1.00 packs/day for 40 years    Types: Cigarettes    Quit date: 07/30/2009  . Smokeless tobacco: Former Systems developer     Comment: quit 5 yrs ago  . Alcohol Use: No  . Drug Use: No  . Sexual Activity: Not on file   Other Topics Concern  . Not on file   Social History Narrative   Divorced, 2 children.      Review of Systems: General: negative for chills, fever, night sweats or weight changes.  Cardiovascular: negative for chest pain, dyspnea on exertion, edema, orthopnea, palpitations, paroxysmal nocturnal dyspnea or shortness of  breath Dermatological: negative for rash Respiratory: negative for cough or wheezing Urologic: negative for hematuria Abdominal: negative for nausea, vomiting, diarrhea, bright red blood per rectum, melena, or hematemesis Neurologic: negative for visual changes, syncope, or dizziness All other systems reviewed and are otherwise negative except as noted above.    Blood pressure 157/82, pulse 72, height 5' 8.5" (1.74 m), weight 277 lb (125.646 kg).  General appearance: alert and no distress Neck: no adenopathy, no carotid bruit, no JVD, supple, symmetrical, trachea midline and thyroid not enlarged, symmetric, no tenderness/mass/nodules Lungs: clear to auscultation bilaterally Heart: regular rate and rhythm, S1, S2 normal, no murmur, click, rub or gallop Extremities: extremities normal, atraumatic, no cyanosis or edema  EKG normal sinus rhythm of 72 without ST or T-wave changes. I personally reviewed this EKG  ASSESSMENT AND PLAN:   ST elevation myocardial infarction (STEMI) of inferolateral wall History of inferolateral STEMI 3/14/15with drug-eluting stent placed to her dominant RCA. The remainder of her coronary anatomy was free of significant disease and she had preserved LV function. She denies chest pain or shortness of breath.  Dyslipidemia History of dyslipidemia on statin therapy followed by her PCP  HTN (hypertension) istory of hypertension blood pressure measures at 157/82. She is on metoprolol. Continue current meds at current dosing      [name] [facility], [facility] 04/05/2016 3:44 PM

## 2016-04-05 NOTE — Assessment & Plan Note (Signed)
History of dyslipidemia on statin therapy followed by her PCP 

## 2016-04-05 NOTE — Assessment & Plan Note (Signed)
istory of hypertension blood pressure measures at 157/82. She is on metoprolol. Continue current meds at current dosing

## 2016-04-05 NOTE — Patient Instructions (Signed)
Mariah Kelley  04/05/2016     @PREFPERIOPPHARMACY @   Your procedure is scheduled on 04/11/2016.  Report to [facility] at 6:15 A.M.  Call this number if you have problems the morning of surgery:  [phone]   Remember:  Do not eat food or drink liquids after midnight.  Take these medicines the morning of surgery with A SIP OF WATER Arimidex, Metoprolol   Do not wear jewelry, make-up or nail polish.  Do not wear lotions, powders, or perfumes.  You may wear deodorant.  Do not shave 48 hours prior to surgery.  Men may shave face and neck.  Do not bring valuables to the hospital.  [facility] is not responsible for any belongings or valuables.  Contacts, dentures or bridgework may not be worn into surgery.  Leave your suitcase in the car.  After surgery it may be brought to your room.  For patients admitted to the hospital, discharge time will be determined by your treatment team.  Patients discharged the day of surgery will not be allowed to drive home.   Please read over the following fact sheets that you were given. Anesthesia Post-op Instructions     PATIENT INSTRUCTIONS POST-ANESTHESIA  IMMEDIATELY FOLLOWING SURGERY:  Do not drive or operate machinery for the first twenty four hours after surgery.  Do not make any important decisions for twenty four hours after surgery or while taking narcotic pain medications or sedatives.  If you develop intractable nausea and vomiting or a severe headache please notify your doctor immediately.  FOLLOW-UP:  Please make an appointment with your surgeon as instructed. You do not need to follow up with anesthesia unless specifically instructed to do so.  WOUND CARE INSTRUCTIONS (if applicable):  Keep a dry clean dressing on the anesthesia/puncture wound site if there is drainage.  Once the wound has quit draining you may leave it open to air.  Generally you should leave the bandage intact for twenty four hours unless there is  drainage.  If the epidural site drains for more than 36-48 hours please call the anesthesia department.  QUESTIONS?:  Please feel free to call your physician or the hospital operator if you have any questions, and they will be happy to assist you.       A cataract is a clouding of the lens of the eye. When a lens becomes cloudy, vision is reduced based on the degree and nature of the clouding. Surgery may be needed to improve vision. Surgery removes the cloudy lens and usually replaces it with a substitute lens (intraocular lens, IOL). LET YOUR EYE DOCTOR KNOW ABOUT:  Allergies to food or medicine.  Medicines taken including herbs, eye drops, over-the-counter medicines, and creams.  Use of steroids (by mouth or creams).  Previous problems with anesthetics or numbing medicine.  History of bleeding problems or blood clots.  Previous surgery.  Other health problems, including diabetes and kidney problems.  Possibility of pregnancy, if this applies. RISKS AND COMPLICATIONS  Infection.  Inflammation of the eyeball (endophthalmitis) that can spread to both eyes (sympathetic ophthalmia).  Poor wound healing.  If an IOL is inserted, it can later fall out of proper position. This is very uncommon.  Clouding of the part of your eye that holds an IOL in place. This is called an "after-cataract." These are uncommon but easily treated. BEFORE THE PROCEDURE  Do not eat or drink anything except small amounts of water for 8 to 12 before your surgery, or  as directed by your caregiver.  Unless you are told otherwise, continue any eye drops you have been prescribed.  Talk to your primary caregiver about all other medicines that you take (both prescription and nonprescription). In some cases, you may need to stop or change medicines near the time of your surgery. This is most important if you are taking blood-thinning medicine.Do not stop medicines unless you are told to do so.  Arrange for  someone to drive you to and from the procedure.  Do not put contact lenses in either eye on the day of your surgery. PROCEDURE There is more than one method for safely removing a cataract. Your doctor can explain the differences and help determine which is best for you. Phacoemulsification surgery is the most common form of cataract surgery.  An injection is given behind the eye or eye drops are given to make this a painless procedure.  A small cut (incision) is made on the edge of the clear, dome-shaped surface that covers the front of the eye (cornea).  A tiny probe is painlessly inserted into the eye. This device gives off ultrasound waves that soften and break up the cloudy center of the lens. This makes it easier for the cloudy lens to be removed by suction.  An IOL may be implanted.  The normal lens of the eye is covered by a clear capsule. Part of that capsule is intentionally left in the eye to support the IOL.  Your surgeon may or may not use stitches to close the incision. There are other forms of cataract surgery that require a larger incision and stitches to close the eye. This approach is taken in cases where the doctor feels that the cataract cannot be easily removed using phacoemulsification. AFTER THE PROCEDURE  When an IOL is implanted, it does not need care. It becomes a permanent part of your eye and cannot be seen or felt.  Your doctor will schedule follow-up exams to check on your progress.  Review your other medicines with your doctor to see which can be resumed after surgery.  Use eye drops or take medicine as prescribed by your doctor.   This information is not intended to replace advice given to you by your health care provider. Make sure you discuss any questions you have with your health care provider.   Document Released: 10/12/2011 Document Revised: 11/13/2014 Document Reviewed: 10/12/2011 Elsevier Interactive Patient Education [employer].

## 2016-04-05 NOTE — Assessment & Plan Note (Signed)
History of inferolateral STEMI 3/14/15with drug-eluting stent placed to her dominant RCA. The remainder of her coronary anatomy was free of significant disease and she had preserved LV function. She denies chest pain or shortness of breath.

## 2016-04-05 NOTE — Patient Instructions (Signed)

## 2016-04-06 ENCOUNTER — Encounter (HOSPITAL_COMMUNITY): Payer: Self-pay

## 2016-04-06 ENCOUNTER — Encounter (HOSPITAL_COMMUNITY)
Admission: RE | Admit: 2016-04-06 | Discharge: 2016-04-06 | Disposition: A | Discharge status: Home / Self Care | Payer: BLUE CROSS/BLUE SHIELD | Source: Ambulatory Visit | Attending: Ophthalmology | Admitting: Ophthalmology

## 2016-04-06 DIAGNOSIS — Z01812 Encounter for preprocedural laboratory examination: Secondary | ICD-10-CM | POA: Insufficient documentation

## 2016-04-06 DIAGNOSIS — Z01810 Encounter for preprocedural cardiovascular examination: Secondary | ICD-10-CM | POA: Diagnosis not present

## 2016-04-06 HISTORY — DX: Type 2 diabetes mellitus without complications: E11.9

## 2016-04-06 LAB — CBC
HCT: 36.9 % (ref 36.0–46.0)
Hemoglobin: 11.9 g/dL — ABNORMAL LOW (ref 12.0–15.0)
MCH: 28.6 pg (ref 26.0–34.0)
MCHC: 32.2 g/dL (ref 30.0–36.0)
MCV: 88.7 fL (ref 78.0–100.0)
Platelets: 298 10*3/uL (ref 150–400)
RBC: 4.16 MIL/uL (ref 3.87–5.11)
RDW: 13.4 % (ref 11.5–15.5)
WBC: 5.7 10*3/uL (ref 4.0–10.5)

## 2016-04-06 LAB — BASIC METABOLIC PANEL
Anion gap: 7 (ref 5–15)
BUN: 13 mg/dL (ref 6–20)
CO2: 26 mmol/L (ref 22–32)
CREATININE: 0.76 mg/dL (ref 0.44–1.00)
Calcium: 9.6 mg/dL (ref 8.9–10.3)
Chloride: 105 mmol/L (ref 101–111)
GFR calc Af Amer: >60 mL/min (ref >60)
Glucose, Bld: 122 mg/dL — ABNORMAL HIGH (ref 65–99)
POTASSIUM: 4 mmol/L (ref 3.5–5.1)
SODIUM: 138 mmol/L (ref 135–145)

## 2016-04-06 NOTE — Progress Notes (Signed)
   04/06/16 1339  OBSTRUCTIVE SLEEP APNEA  Have you ever been diagnosed with sleep apnea through a sleep study? No  Do you snore loudly (loud enough to be heard through closed doors)?  0  Do you often feel tired, fatigued, or sleepy during the daytime (such as falling asleep during driving or talking to someone)? 0  Has anyone observed you stop breathing during your sleep? 1  Do you have, or are you being treated for high blood pressure? 1  BMI more than 35 kg/m2? 1  Age > 29 (1-yes) 1  Female Gender (Yes=1) 0  Obstructive Sleep Apnea Score 4  Score 5 or greater  Results sent to PCP

## 2016-04-11 ENCOUNTER — Encounter (HOSPITAL_COMMUNITY)
Admission: RE | Disposition: A | Discharge status: Home / Self Care | Payer: Self-pay | Source: Ambulatory Visit | Attending: Ophthalmology

## 2016-04-11 ENCOUNTER — Encounter (HOSPITAL_COMMUNITY): Payer: Self-pay | Admitting: Anesthesiology

## 2016-04-11 ENCOUNTER — Ambulatory Visit (HOSPITAL_COMMUNITY): Payer: BLUE CROSS/BLUE SHIELD | Admitting: Anesthesiology

## 2016-04-11 ENCOUNTER — Ambulatory Visit (HOSPITAL_COMMUNITY)
Admission: RE | Admit: 2016-04-11 | Discharge: 2016-04-11 | Disposition: A | Discharge status: Home / Self Care | Payer: BLUE CROSS/BLUE SHIELD | Source: Ambulatory Visit | Attending: Ophthalmology | Admitting: Ophthalmology

## 2016-04-11 DIAGNOSIS — H268 Other specified cataract: Secondary | ICD-10-CM | POA: Diagnosis not present

## 2016-04-11 DIAGNOSIS — Z853 Personal history of malignant neoplasm of breast: Secondary | ICD-10-CM | POA: Diagnosis not present

## 2016-04-11 DIAGNOSIS — E119 Type 2 diabetes mellitus without complications: Secondary | ICD-10-CM | POA: Insufficient documentation

## 2016-04-11 DIAGNOSIS — I10 Essential (primary) hypertension: Secondary | ICD-10-CM | POA: Insufficient documentation

## 2016-04-11 DIAGNOSIS — Z79899 Other long term (current) drug therapy: Secondary | ICD-10-CM | POA: Diagnosis not present

## 2016-04-11 DIAGNOSIS — I252 Old myocardial infarction: Secondary | ICD-10-CM | POA: Diagnosis not present

## 2016-04-11 DIAGNOSIS — Z7982 Long term (current) use of aspirin: Secondary | ICD-10-CM | POA: Diagnosis not present

## 2016-04-11 DIAGNOSIS — E7800 Pure hypercholesterolemia, unspecified: Secondary | ICD-10-CM | POA: Diagnosis not present

## 2016-04-11 DIAGNOSIS — Z7902 Long term (current) use of antithrombotics/antiplatelets: Secondary | ICD-10-CM | POA: Insufficient documentation

## 2016-04-11 HISTORY — PX: CATARACT EXTRACTION W/PHACO: SHX586

## 2016-04-11 LAB — GLUCOSE, CAPILLARY: GLUCOSE-CAPILLARY: 122 mg/dL — AB (ref 65–99)

## 2016-04-11 SURGERY — PHACOEMULSIFICATION, CATARACT, WITH IOL INSERTION
Anesthesia: Monitor Anesthesia Care | Site: Eye | Laterality: Right

## 2016-04-11 MED ORDER — MIDAZOLAM HCL 2 MG/2ML IJ SOLN
INTRAMUSCULAR | Status: AC
  Filled 2016-04-11: qty 2

## 2016-04-11 MED ORDER — FENTANYL CITRATE (PF) 100 MCG/2ML IJ SOLN
25.0000 ug | INTRAMUSCULAR | Status: AC
  Administered 2016-04-11 (×2): 25 ug via INTRAVENOUS

## 2016-04-11 MED ORDER — PROVISC 10 MG/ML IO SOLN
INTRAOCULAR | Status: DC | PRN
  Administered 2016-04-11: 0.85 mL via INTRAOCULAR

## 2016-04-11 MED ORDER — MIDAZOLAM HCL 2 MG/2ML IJ SOLN
1.0000 mg | INTRAMUSCULAR | Status: DC | PRN
  Administered 2016-04-11: 2 mg via INTRAVENOUS

## 2016-04-11 MED ORDER — TETRACAINE HCL 0.5 % OP SOLN
1.0000 [drp] | OPHTHALMIC | Status: AC
  Administered 2016-04-11 (×3): 1 [drp] via OPHTHALMIC

## 2016-04-11 MED ORDER — EPINEPHRINE HCL 1 MG/ML IJ SOLN
INTRAMUSCULAR | Status: DC | PRN
  Administered 2016-04-11: 500 mL

## 2016-04-11 MED ORDER — EPINEPHRINE HCL 1 MG/ML IJ SOLN
INTRAMUSCULAR | Status: AC
  Filled 2016-04-11: qty 1

## 2016-04-11 MED ORDER — BSS IO SOLN
INTRAOCULAR | Status: DC | PRN
  Administered 2016-04-11: 15 mL

## 2016-04-11 MED ORDER — KETOROLAC TROMETHAMINE 0.5 % OP SOLN
1.0000 [drp] | OPHTHALMIC | Status: AC
Start: 2016-04-11 — End: 2016-04-11
  Administered 2016-04-11 (×3): 1 [drp] via OPHTHALMIC

## 2016-04-11 MED ORDER — CYCLOPENTOLATE-PHENYLEPHRINE 0.2-1 % OP SOLN
1.0000 [drp] | OPHTHALMIC | Status: AC
  Administered 2016-04-11 (×3): 1 [drp] via OPHTHALMIC

## 2016-04-11 MED ORDER — FENTANYL CITRATE (PF) 100 MCG/2ML IJ SOLN
INTRAMUSCULAR | Status: AC
  Filled 2016-04-11: qty 2

## 2016-04-11 MED ORDER — PHENYLEPHRINE HCL 2.5 % OP SOLN
1.0000 [drp] | OPHTHALMIC | Status: AC
  Administered 2016-04-11 (×3): 1 [drp] via OPHTHALMIC

## 2016-04-11 MED ORDER — LACTATED RINGERS IV SOLN
INTRAVENOUS | Status: DC
  Administered 2016-04-11: 08:00:00 via INTRAVENOUS

## 2016-04-11 MED ORDER — TETRACAINE 0.5 % OP SOLN OPTIME - NO CHARGE
OPHTHALMIC | Status: DC | PRN
  Administered 2016-04-11: 2 [drp] via OPHTHALMIC

## 2016-04-11 SURGICAL SUPPLY — 11 items
CLOTH BEACON ORANGE TIMEOUT ST (SAFETY) ×2 IMPLANT
EYE SHIELD UNIVERSAL CLEAR (GAUZE/BANDAGES/DRESSINGS) ×2 IMPLANT
GLOVE BIO SURGEON STRL SZ 6.5 (GLOVE) ×1 IMPLANT
GLOVE BIO SURGEONS STRL SZ 6.5 (GLOVE) ×1
GLOVE EXAM NITRILE MD LF STRL (GLOVE) ×3 IMPLANT
LENS ALC ACRYL/TECN (Ophthalmic Related) ×3 IMPLANT
PAD ARMBOARD 7.5X6 YLW CONV (MISCELLANEOUS) ×2 IMPLANT
RING MALYGIN (MISCELLANEOUS) ×3 IMPLANT
TAPE SURG TRANSPORE 1 IN (GAUZE/BANDAGES/DRESSINGS) IMPLANT
TAPE SURGICAL TRANSPORE 1 IN (GAUZE/BANDAGES/DRESSINGS) ×2
WATER STERILE IRR 250ML POUR (IV SOLUTION) ×2 IMPLANT

## 2016-04-11 NOTE — Transfer of Care (Signed)
Immediate Anesthesia Transfer of Care Note  Patient: [facility]  Procedure(s) Performed: Procedure(s) with comments: CATARACT EXTRACTION PHACO AND INTRAOCULAR LENS PLACEMENT (IOC) (Right) - CDE: 14.15  Patient Location: Short Stay  Anesthesia Type:MAC  Level of Consciousness: awake, alert , oriented and patient cooperative  Airway & Oxygen Therapy: Patient Spontanous Breathing  Post-op Assessment: Report given to RN and Post -op Vital signs reviewed and stable  Post vital signs: Reviewed and stable  Last Vitals:  Filed Vitals:   04/11/16 0805 04/11/16 0810  BP:  175/72  Pulse:    Temp:    Resp: 19 24    Last Pain: There were no vitals filed for this visit.    Patients Stated Pain Goal: 5 ([id number])  Complications: No apparent anesthesia complications

## 2016-04-11 NOTE — H&P (Signed)
The patient was re examined and there is no change in the patients condition since the original H and P. 

## 2016-04-11 NOTE — Anesthesia Preprocedure Evaluation (Signed)
Anesthesia Evaluation  Patient identified by MRN, date of birth, ID band Patient awake    Reviewed: Allergy & Precautions, NPO status , Patient's Chart, lab work & pertinent test results  History of Anesthesia Complications Negative for: history of anesthetic complications  Airway Mallampati: III  TM Distance: >3 FB Neck ROM: Full    Dental no notable dental hx. (+) Dental Advisory Given, Partial Upper   Pulmonary former smoker,    Pulmonary exam normal breath sounds clear to auscultation       Cardiovascular hypertension, + CAD and + Past MI  Normal cardiovascular exam Rhythm:Regular Rate:Normal     Neuro/Psych negative neurological ROS  negative psych ROS   GI/Hepatic negative GI ROS, Neg liver ROS,   Endo/Other  diabetes, Type 2  Renal/GU negative Renal ROS  negative genitourinary   Musculoskeletal  (+) Arthritis , Osteoarthritis,    Abdominal   Peds negative pediatric ROS (+)  Hematology negative hematology ROS (+)   Anesthesia Other Findings   Reproductive/Obstetrics negative OB ROS                             Anesthesia Physical Anesthesia Plan  ASA: III  Anesthesia Plan: MAC   Post-op Pain Management:    Induction: Intravenous  Airway Management Planned: Nasal Cannula  Additional Equipment:   Intra-op Plan:   Post-operative Plan:   Informed Consent: I have reviewed the patients History and Physical, chart, labs and discussed the procedure including the risks, benefits and alternatives for the proposed anesthesia with the patient or authorized representative who has indicated his/her understanding and acceptance.     Plan Discussed with:   Anesthesia Plan Comments:         Anesthesia Quick Evaluation

## 2016-04-11 NOTE — Op Note (Signed)
Patient brought to the operating room and prepped and draped in the usual manner.  Lid speculum inserted in right eye.  Stab incision made at the twelve o'clock position.  Provisc instilled in the anterior chamber.   A 2.4 mm. Stab incision was made temporally. Due to a small pupil, a Malugyn Ring was inserted. An anterior capsulotomy was done with a bent 25 gauge needle.  The nucleus was hydrodissected.  The Phaco tip was inserted in the anterior chamber and the nucleus was emulsified.  CDE was 14.15.  The cortical material was then removed with the I and A tip.  Posterior capsule was the polished.  The anterior chamber was deepened with Provisc.  A 19.0 Diopter Hoya Model 250 IOL was then inserted in the capsular bag.  The Malugyn Ring was removed.  Provisc was then removed with the I and A tip.  The wound was then hydrated.  Patient sent to the Recovery Room in good condition with follow up in my office.  Preoperative Diagnosis:  Nuclear Cataract OD Postoperative Diagnosis:  Same Procedure name: Kelman Phacoemulsification OD with IOL

## 2016-04-11 NOTE — Discharge Instructions (Signed)
°  °          [facility] Instructions °[address]- [place] [address]-[place] °    ° °1. Avoid closing eyes tightly. One often closes the eye tightly when laughing, talking, sneezing, coughing or if they feel irritated. At these times, you should be careful not to close your eyes tightly. ° °2. Instill eye drops as instructed. To instill drops in your eye, open it, look up and have someone gently pull the lower lid down and instill a couple of drops inside the lower lid. ° °3. Do not touch upper lid. ° °4. Take Advil or Tylenol for pain. ° °5. You may use either eye for near work, such as reading or sewing and you may watch television. ° °6. You may have your hair done at the beauty parlor at any time. ° °7. Wear dark glasses with or without your own glasses if you are in bright light. ° °8. Call our office at [phone] or [phone] if you have sharp pain in your eye or unusual symptoms. ° °9.  FOLLOW UP WITH DR. SHAPIRO TODAY IN HIS [place] OFFICE AT 2:45pm. ° °  °I have received a copy of the above instructions and will follow them.  ° ° ° °IF YOU ARE IN IMMEDIATE DANGER CALL 911! ° °It is important for you to keep your follow-up appointment with your physician after discharge, OR, for you /your caregiver to make a follow-up appointment with your physician / medical provider after discharge. ° °Show these instructions to the next healthcare provider you see. ° °

## 2016-04-11 NOTE — Anesthesia Procedure Notes (Signed)
Procedure Name: MAC Date/Time: 04/11/2016 8:10 AM Performed by: Andree Elk, AMY A Pre-anesthesia Checklist: Patient identified, Timeout performed, Emergency Drugs available, Suction available and Patient being monitored Oxygen Delivery Method: Nasal cannula

## 2016-04-11 NOTE — Anesthesia Postprocedure Evaluation (Signed)
Anesthesia Post Note  Patient: [facility]  Procedure(s) Performed: Procedure(s) (LRB): CATARACT EXTRACTION PHACO AND INTRAOCULAR LENS PLACEMENT (IOC) (Right)  Patient location during evaluation: Short Stay Anesthesia Type: MAC Level of consciousness: awake and alert and oriented Pain management: pain level controlled Vital Signs Assessment: post-procedure vital signs reviewed and stable Respiratory status: spontaneous breathing Cardiovascular status: stable Postop Assessment: no signs of nausea or vomiting Anesthetic complications: no    Last Vitals:  Filed Vitals:   04/11/16 0805 04/11/16 0810  BP:  175/72  Pulse:    Temp:    Resp: 19 24    Last Pain: There were no vitals filed for this visit.               [place] A

## 2016-04-12 ENCOUNTER — Encounter (HOSPITAL_COMMUNITY): Payer: Self-pay | Admitting: Ophthalmology

## 2016-04-26 ENCOUNTER — Encounter (HOSPITAL_COMMUNITY): Payer: Self-pay

## 2016-04-26 ENCOUNTER — Encounter (HOSPITAL_COMMUNITY)
Admission: RE | Admit: 2016-04-26 | Discharge: 2016-04-26 | Disposition: A | Discharge status: Home / Self Care | Payer: BLUE CROSS/BLUE SHIELD | Source: Ambulatory Visit | Attending: Ophthalmology | Admitting: Ophthalmology

## 2016-05-01 NOTE — Anesthesia Preprocedure Evaluation (Addendum)
Anesthesia Evaluation  Patient identified by MRN, date of birth, ID band Patient awake    Reviewed: Allergy & Precautions, NPO status , Patient's Chart, lab work & pertinent test results, reviewed documented beta blocker date and time   Airway Mallampati: III  TM Distance: >3 FB Neck ROM: Full    Dental  (+) Teeth Intact, Dental Advisory Given   Pulmonary former smoker,    breath sounds clear to auscultation       Cardiovascular hypertension, Pt. on home beta blockers + CAD, + Past MI and + Cardiac Stents   Rhythm:Regular Rate:Normal     Neuro/Psych negative neurological ROS  negative psych ROS   GI/Hepatic negative GI ROS, Neg liver ROS,   Endo/Other  diabetes  Renal/GU negative Renal ROS  negative genitourinary   Musculoskeletal  (+) Arthritis ,   Abdominal   Peds negative pediatric ROS (+)  Hematology negative hematology ROS (+)   Anesthesia Other Findings   Reproductive/Obstetrics negative OB ROS                            Lab Results  Component Value Date   WBC 5.7 04/06/2016   HGB 11.9* 04/06/2016   HCT 36.9 04/06/2016   MCV 88.7 04/06/2016   PLT 298 04/06/2016   Lab Results  Component Value Date   CREATININE 0.76 04/06/2016   BUN 13 04/06/2016   NA 138 04/06/2016   K 4.0 04/06/2016   CL 105 04/06/2016   CO2 26 04/06/2016   No results found for: INR, PROTIME  03/2016 EKG: normal sinus rhythm.   Anesthesia Physical Anesthesia Plan  ASA: III  Anesthesia Plan: MAC   Post-op Pain Management:    Induction: Intravenous  Airway Management Planned: Nasal Cannula  Additional Equipment:   Intra-op Plan:   Post-operative Plan:   Informed Consent: I have reviewed the patients History and Physical, chart, labs and discussed the procedure including the risks, benefits and alternatives for the proposed anesthesia with the patient or authorized representative who has  indicated his/her understanding and acceptance.     Plan Discussed with: CRNA  Anesthesia Plan Comments:         Anesthesia Quick Evaluation

## 2016-05-02 ENCOUNTER — Ambulatory Visit (HOSPITAL_COMMUNITY)
Admission: RE | Admit: 2016-05-02 | Discharge: 2016-05-02 | Disposition: A | Discharge status: Home / Self Care | Payer: BLUE CROSS/BLUE SHIELD | Source: Ambulatory Visit | Attending: Ophthalmology | Admitting: Ophthalmology

## 2016-05-02 ENCOUNTER — Ambulatory Visit (HOSPITAL_COMMUNITY): Payer: BLUE CROSS/BLUE SHIELD | Admitting: Anesthesiology

## 2016-05-02 ENCOUNTER — Encounter (HOSPITAL_COMMUNITY)
Admission: RE | Disposition: A | Discharge status: Home / Self Care | Payer: Self-pay | Source: Ambulatory Visit | Attending: Ophthalmology

## 2016-05-02 ENCOUNTER — Encounter (HOSPITAL_COMMUNITY): Payer: Self-pay | Admitting: *Deleted

## 2016-05-02 DIAGNOSIS — H268 Other specified cataract: Secondary | ICD-10-CM | POA: Insufficient documentation

## 2016-05-02 DIAGNOSIS — Z853 Personal history of malignant neoplasm of breast: Secondary | ICD-10-CM | POA: Insufficient documentation

## 2016-05-02 DIAGNOSIS — Z7902 Long term (current) use of antithrombotics/antiplatelets: Secondary | ICD-10-CM | POA: Diagnosis not present

## 2016-05-02 DIAGNOSIS — I10 Essential (primary) hypertension: Secondary | ICD-10-CM | POA: Insufficient documentation

## 2016-05-02 DIAGNOSIS — Z955 Presence of coronary angioplasty implant and graft: Secondary | ICD-10-CM | POA: Diagnosis not present

## 2016-05-02 DIAGNOSIS — E119 Type 2 diabetes mellitus without complications: Secondary | ICD-10-CM | POA: Insufficient documentation

## 2016-05-02 DIAGNOSIS — Z7982 Long term (current) use of aspirin: Secondary | ICD-10-CM | POA: Diagnosis not present

## 2016-05-02 DIAGNOSIS — I252 Old myocardial infarction: Secondary | ICD-10-CM | POA: Diagnosis not present

## 2016-05-02 DIAGNOSIS — I2510 Atherosclerotic heart disease of native coronary artery without angina pectoris: Secondary | ICD-10-CM | POA: Diagnosis not present

## 2016-05-02 DIAGNOSIS — Z87891 Personal history of nicotine dependence: Secondary | ICD-10-CM | POA: Diagnosis not present

## 2016-05-02 DIAGNOSIS — M1990 Unspecified osteoarthritis, unspecified site: Secondary | ICD-10-CM | POA: Diagnosis not present

## 2016-05-02 HISTORY — PX: CATARACT EXTRACTION W/PHACO: SHX586

## 2016-05-02 SURGERY — PHACOEMULSIFICATION, CATARACT, WITH IOL INSERTION
Anesthesia: Monitor Anesthesia Care | Site: Eye | Laterality: Left

## 2016-05-02 MED ORDER — PHENYLEPHRINE HCL 2.5 % OP SOLN
1.0000 [drp] | OPHTHALMIC | Status: AC
  Administered 2016-05-02 (×3): 1 [drp] via OPHTHALMIC

## 2016-05-02 MED ORDER — FENTANYL CITRATE (PF) 100 MCG/2ML IJ SOLN
25.0000 ug | INTRAMUSCULAR | Status: DC | PRN
  Administered 2016-05-02 (×2): 25 ug via INTRAVENOUS
  Filled 2016-05-02: qty 2

## 2016-05-02 MED ORDER — PROVISC 10 MG/ML IO SOLN
INTRAOCULAR | Status: DC | PRN
  Administered 2016-05-02: 0.85 mL via INTRAOCULAR

## 2016-05-02 MED ORDER — LACTATED RINGERS IV SOLN
INTRAVENOUS | Status: DC
  Administered 2016-05-02: 1000 mL via INTRAVENOUS

## 2016-05-02 MED ORDER — MIDAZOLAM HCL 2 MG/2ML IJ SOLN
1.0000 mg | INTRAMUSCULAR | Status: DC | PRN
  Administered 2016-05-02: 2 mg via INTRAVENOUS
  Filled 2016-05-02: qty 2

## 2016-05-02 MED ORDER — EPINEPHRINE HCL 1 MG/ML IJ SOLN
INTRAOCULAR | Status: DC | PRN
  Administered 2016-05-02: 500 mL

## 2016-05-02 MED ORDER — ONDANSETRON HCL 4 MG/2ML IJ SOLN
4.0000 mg | Freq: Once | INTRAMUSCULAR | Status: AC
  Administered 2016-05-02: 4 mg via INTRAVENOUS
  Filled 2016-05-02: qty 2

## 2016-05-02 MED ORDER — EPINEPHRINE HCL 1 MG/ML IJ SOLN
INTRAMUSCULAR | Status: AC
  Filled 2016-05-02: qty 1

## 2016-05-02 MED ORDER — TETRACAINE HCL 0.5 % OP SOLN
1.0000 [drp] | OPHTHALMIC | Status: AC
  Administered 2016-05-02 (×3): 1 [drp] via OPHTHALMIC

## 2016-05-02 MED ORDER — BSS IO SOLN
INTRAOCULAR | Status: DC | PRN
  Administered 2016-05-02: 15 mL

## 2016-05-02 MED ORDER — ONDANSETRON HCL 4 MG/2ML IJ SOLN
INTRAMUSCULAR | Status: DC | PRN
  Administered 2016-05-02: 4 mg via INTRAVENOUS

## 2016-05-02 MED ORDER — KETOROLAC TROMETHAMINE 0.5 % OP SOLN
1.0000 [drp] | OPHTHALMIC | Status: AC
  Administered 2016-05-02 (×3): 1 [drp] via OPHTHALMIC

## 2016-05-02 MED ORDER — TETRACAINE 0.5 % OP SOLN OPTIME - NO CHARGE
OPHTHALMIC | Status: DC | PRN
  Administered 2016-05-02: 1 [drp] via OPHTHALMIC

## 2016-05-02 MED ORDER — CYCLOPENTOLATE-PHENYLEPHRINE 0.2-1 % OP SOLN
1.0000 [drp] | OPHTHALMIC | Status: AC
  Administered 2016-05-02 (×3): 1 [drp] via OPHTHALMIC

## 2016-05-02 SURGICAL SUPPLY — 11 items
CLOTH BEACON ORANGE TIMEOUT ST (SAFETY) ×2 IMPLANT
EYE SHIELD UNIVERSAL CLEAR (GAUZE/BANDAGES/DRESSINGS) ×2 IMPLANT
GLOVE BIO SURGEON STRL SZ 6.5 (GLOVE) ×1 IMPLANT
GLOVE BIO SURGEONS STRL SZ 6.5 (GLOVE) ×1
GLOVE EXAM NITRILE MD LF STRL (GLOVE) ×2 IMPLANT
LENS ALC ACRYL/TECN (Ophthalmic Related) ×3 IMPLANT
PAD ARMBOARD 7.5X6 YLW CONV (MISCELLANEOUS) ×2 IMPLANT
RING MALYGIN (MISCELLANEOUS) ×2 IMPLANT
TAPE SURG TRANSPORE 1 IN (GAUZE/BANDAGES/DRESSINGS) IMPLANT
TAPE SURGICAL TRANSPORE 1 IN (GAUZE/BANDAGES/DRESSINGS) ×2
WATER STERILE IRR 250ML POUR (IV SOLUTION) ×2 IMPLANT

## 2016-05-02 NOTE — Discharge Instructions (Signed)
°  °          [facility] Instructions °[address]- [place] [address]-[place] °    ° °1. Avoid closing eyes tightly. One often closes the eye tightly when laughing, talking, sneezing, coughing or if they feel irritated. At these times, you should be careful not to close your eyes tightly. ° °2. Instill eye drops as instructed. To instill drops in your eye, open it, look up and have someone gently pull the lower lid down and instill a couple of drops inside the lower lid. ° °3. Do not touch upper lid. ° °4. Take Advil or Tylenol for pain. ° °5. You may use either eye for near work, such as reading or sewing and you may watch television. ° °6. You may have your hair done at the beauty parlor at any time. ° °7. Wear dark glasses with or without your own glasses if you are in bright light. ° °8. Call our office at [phone] or [phone] if you have sharp pain in your eye or unusual symptoms. ° °9.  FOLLOW UP WITH DR. SHAPIRO TODAY IN HIS [place] OFFICE AT 2:45pm. ° °  °I have received a copy of the above instructions and will follow them.  ° ° ° °IF YOU ARE IN IMMEDIATE DANGER CALL 911! ° °It is important for you to keep your follow-up appointment with your physician after discharge, OR, for you /your caregiver to make a follow-up appointment with your physician / medical provider after discharge. ° °Show these instructions to the next healthcare provider you see. ° °

## 2016-05-02 NOTE — H&P (Signed)
The patient was re examined and there is no change in the patients condition since the original H and P. 

## 2016-05-02 NOTE — Transfer of Care (Signed)
Immediate Anesthesia Transfer of Care Note  Patient: [facility]  Procedure(s) Performed: Procedure(s) (LRB): CATARACT EXTRACTION PHACO AND INTRAOCULAR LENS PLACEMENT (IOC) (Left)  Patient Location: Shortstay  Anesthesia Type: MAC  Level of Consciousness: awake  Airway & Oxygen Therapy: Patient Spontanous Breathing   Post-op Assessment: Report given to PACU RN, Post -op Vital signs reviewed and stable and Patient moving all extremities  Post vital signs: Reviewed and stable  Complications: No apparent anesthesia complications

## 2016-05-02 NOTE — Anesthesia Procedure Notes (Signed)
Procedure Name: MAC Date/Time: 05/02/2016 10:27 AM Performed by: [place] Deck Pre-anesthesia Checklist: Patient identified, Emergency Drugs available, Suction available, Timeout performed and Patient being monitored Patient Re-evaluated:Patient Re-evaluated prior to inductionOxygen Delivery Method: Nasal Cannula

## 2016-05-02 NOTE — Anesthesia Postprocedure Evaluation (Signed)
  Anesthesia Post-op Note  Patient: [facility]  Procedure(s) Performed: Procedure(s) (LRB): CATARACT EXTRACTION PHACO AND INTRAOCULAR LENS PLACEMENT (IOC) (Left)  Patient Location:  Short Stay  Anesthesia Type: MAC  Level of Consciousness: awake  Airway and Oxygen Therapy: Patient Spontanous Breathing  Post-op Pain: none  Post-op Assessment: Post-op Vital signs reviewed, Patient's Cardiovascular Status Stable, Respiratory Function Stable, Patent Airway, No signs of Nausea or vomiting and Pain level controlled  Post-op Vital Signs: Reviewed and stable  Complications: No apparent anesthesia complications

## 2016-05-02 NOTE — Op Note (Signed)
Patient brought to the operating room and prepped and draped in the usual manner.  Lid speculum inserted in left eye.  Stab incision made at the twelve o'clock position.  Provisc instilled in the anterior chamber.   A 2.4 mm. Stab incision was made temporally. Due to a small pupil, a Malugyn Ring was inserted.  An anterior capsulotomy was done with a bent 25 gauge needle.  The nucleus was hydrodissected.  The Phaco tip was inserted in the anterior chamber and the nucleus was emulsified.  CDE was 12.11.  The cortical material was then removed with the I and A tip.  Posterior capsule was the polished.  The anterior chamber was deepened with Provisc.  A 18.5 Diopter Hoya Model 250 IOL was then inserted in the capsular bag. The Malugyn Ring was removed.  Provisc was then removed with the I and A tip.  The wound was then hydrated.  Patient sent to the Recovery Room in good condition with follow up in my office.  Preoperative Diagnosis:  Nuclear Cataract OS Postoperative Diagnosis:  Same Procedure name: Kelman Phacoemulsification OS with IOL

## 2016-05-03 ENCOUNTER — Encounter (HOSPITAL_COMMUNITY): Payer: Self-pay | Admitting: Ophthalmology

## 2016-05-12 ENCOUNTER — Other Ambulatory Visit: Payer: Self-pay

## 2016-05-12 MED ORDER — ATORVASTATIN CALCIUM 80 MG PO TABS: 80.0000 mg | ORAL_TABLET | Freq: Every day | ORAL | Status: DC

## 2016-05-20 ENCOUNTER — Other Ambulatory Visit: Payer: Self-pay | Admitting: Cardiovascular Disease

## 2016-05-22 ENCOUNTER — Other Ambulatory Visit: Payer: Self-pay | Admitting: Cardiovascular Disease

## 2016-05-22 NOTE — Telephone Encounter (Signed)
Rx(s) sent to pharmacy electronically.  

## 2016-06-11 ENCOUNTER — Other Ambulatory Visit: Payer: Self-pay | Admitting: Hematology and Oncology

## 2016-06-11 DIAGNOSIS — C50411 Malignant neoplasm of upper-outer quadrant of right female breast: Secondary | ICD-10-CM

## 2016-07-03 ENCOUNTER — Ambulatory Visit (HOSPITAL_BASED_OUTPATIENT_CLINIC_OR_DEPARTMENT_OTHER): Payer: 59 | Admitting: Hematology and Oncology

## 2016-07-03 ENCOUNTER — Other Ambulatory Visit: Payer: Self-pay | Admitting: *Deleted

## 2016-07-03 ENCOUNTER — Telehealth: Payer: Self-pay | Admitting: Hematology and Oncology

## 2016-07-03 ENCOUNTER — Encounter: Payer: Self-pay | Admitting: Hematology and Oncology

## 2016-07-03 DIAGNOSIS — N951 Menopausal and female climacteric states: Secondary | ICD-10-CM

## 2016-07-03 DIAGNOSIS — C50411 Malignant neoplasm of upper-outer quadrant of right female breast: Secondary | ICD-10-CM

## 2016-07-03 NOTE — Progress Notes (Signed)
Patient Care Team: Jonathon Jordan, MD as PCP - General (Family Medicine) Alphonsa Overall, MD as Consulting Physician (General Surgery) Nicholas Lose, MD as Consulting Physician (Hematology and Oncology) Thea Silversmith, MD as Consulting Physician (Radiation Oncology) Mauro Kaufmann, RN as Registered Nurse Rockwell Germany, RN as Registered Nurse Holley Bouche, NP as Nurse Practitioner (Nurse Practitioner) Irene Limbo, MD as Consulting Physician (Plastic Surgery) Sylvan Cheese, NP as Nurse Practitioner (Nurse Practitioner)  DIAGNOSIS: Breast cancer of upper-outer quadrant of right female breast [facility])   Staging form: Breast, AJCC 7th Edition   - Clinical stage from 12/30/2014: Stage IIA (T2, N0, M0) - Unsigned   - Pathologic stage from 02/02/2015: Stage IIA (T2, N0, cM0) - Signed by Enid Cutter, MD on 02/08/2015         Staging comments: Staged on final mastectomy specimen by Dr. Avis Epley.  SUMMARY OF ONCOLOGIC HISTORY:   Breast cancer of upper-outer quadrant of right female breast ([place])   04/29/2014 Procedure    Prior right breast bx: fibrocystic changes with calcifications, no evidence of malignancy.      12/11/2014 Mammogram    Right breast: focal asymmetric density with indistinct margin at 11:00 middle depth. Also area of grouped heterogenous calcifications at 12:00, middle depth, increased in number.      12/11/2014 Breast US    Right breast: 2 cm round mass with irregular margin in UOQ, posterior depth; hypoechoic with posterior acoustic enhancement; lobulated LN with focal cortex thickening in right axilla      12/17/2014 Initial Biopsy    Right breast biopsy: Invasive ductal carcinoma with DCIS, right axillary lymph node benign      12/24/2014 Procedure    Right breast needle core bx: at least atypical ductal hyperplasia with associated calcifications - suspicious for DCIS      12/29/2014 Breast MRI    2.1 cm mass in the upper-outer quadrant of the right breast 2  possible satellite nodules associated with this mass. NME 2.2cm, together 5.1 cm      12/29/2014 Clinical Stage    Stage IIA: T2 N0      01/27/2015 Definitive Surgery    Right breast mastectomy/SLNB Lucia Gaskins): invasive ductal carcinoma, grade 2, with DCIS (int. grade), spanning 2.2 cm, ER+ (100%), PR- (0%,) HER-2 negative (ratio 1.53), Ki-67 21%. 1 LN negative for malignancy (0/1)      01/27/2015 Pathologic Stage    Stage IIA: pT2 pN0      01/27/2015 Oncotype testing    Score: 25 (16% ROR)      03/03/2015 - 05/04/2015 Chemotherapy    Adjuvant chemotherapy with docetaxel and cyclophosphamide 4 (Solei Wubben).      06/04/2015 -  Anti-estrogen oral therapy    Anastrozole 1 mg daily. Planned duration of therapy 5 years Lindi Adie)      08/25/2015 Survivorship    Survivorship care plan completed and mailed to patient in lieu of in person visit       CHIEF COMPLIANT: Follow-up on anastrozole  INTERVAL HISTORY: Mariah Kelley is a 65 year old with above-mentioned history of right breast cancer currently on adjuvant antiestrogen therapy with anastrozole. She is tolerating anastrozole fairly well. Occasional hot flashes continuing to be a problem. Denies any myalgias. Denies any lumps or nodules in the breast.  REVIEW OF SYSTEMS:   Constitutional: Denies fevers, chills or abnormal weight loss Eyes: Denies blurriness of vision Ears, nose, mouth, throat, and face: Denies mucositis or sore throat Respiratory: Denies cough, dyspnea or wheezes Cardiovascular: Denies  palpitation, chest discomfort Gastrointestinal:  Denies nausea, heartburn or change in bowel habits Skin: Denies abnormal skin rashes Lymphatics: Denies new lymphadenopathy or easy bruising Neurological:Denies numbness, tingling or new weaknesses Behavioral/Psych: Mood is stable, no new changes  Extremities: No lower extremity edema Breast:  denies any pain or lumps or nodules in either breasts All other systems were reviewed  with the patient and are negative.  I have reviewed the past medical history, past surgical history, social history and family history with the patient and they are unchanged from previous note.  ALLERGIES:  is allergic to codeine and prednisone.  MEDICATIONS:  Current Outpatient Prescriptions  Medication Sig Dispense Refill  . acetaminophen (TYLENOL) 500 MG tablet Take 500 mg by mouth every 6 (six) hours as needed for mild pain.    [name] anastrozole (ARIMIDEX) 1 MG tablet TAKE 1 TABLET (1 MG TOTAL) BY MOUTH DAILY. 90 tablet 3  . aspirin EC 81 MG tablet Take 81 mg by mouth daily.    [name] atorvastatin (LIPITOR) 80 MG tablet Take 1 tablet (80 mg total) by mouth daily at 6 PM. NEED OV. 30 tablet 10  . cholecalciferol (VITAMIN D) 1000 UNITS tablet Take 2,000 Units by mouth daily.    . clopidogrel (PLAVIX) 75 MG tablet TAKE 1 TAB BY MOUTH ONCE DAILY. NEEDS OFFICE VISIT FOR MORE REFILLS 90 tablet 0  . metoprolol (LOPRESSOR) 50 MG tablet Take 1 tablet (50 mg total) by mouth 2 (two) times daily. 60 tablet 10  . nitroGLYCERIN (NITROSTAT) 0.4 MG SL tablet Place 1 tablet (0.4 mg total) under the tongue every 5 (five) minutes as needed for chest pain. 25 tablet 3   No current facility-administered medications for this visit.     PHYSICAL EXAMINATION: ECOG PERFORMANCE STATUS: 1 - Symptomatic but completely ambulatory  There were no vitals filed for this visit. There were no vitals filed for this visit.  GENERAL:alert, no distress and comfortable SKIN: skin color, texture, turgor are normal, no rashes or significant lesions EYES: normal, Conjunctiva are pink and non-injected, sclera clear OROPHARYNX:no exudate, no erythema and lips, buccal mucosa, and tongue normal  NECK: supple, thyroid normal size, non-tender, without nodularity LYMPH:  no palpable lymphadenopathy in the cervical, axillary or inguinal LUNGS: clear to auscultation and percussion with normal breathing effort HEART: regular rate & rhythm  and no murmurs and no lower extremity edema ABDOMEN:abdomen soft, non-tender and normal bowel sounds MUSCULOSKELETAL:no cyanosis of digits and no clubbing  NEURO: alert & oriented x 3 with fluent speech, no focal motor/sensory deficits EXTREMITIES: No lower extremity edema BREAST: No palpable masses or nodules in either right or left breasts. No palpable axillary supraclavicular or infraclavicular adenopathy no breast tenderness or nipple discharge. (exam performed in the presence of a chaperone)  LABORATORY DATA:  I have reviewed the data as listed   Chemistry      Component Value Date/Time   NA 138 04/06/2016 1448   NA 142 07/05/2015 1010   K 4.0 04/06/2016 1448   K 4.3 07/05/2015 1010   CL 105 04/06/2016 1448   CO2 26 04/06/2016 1448   CO2 28 07/05/2015 1010   BUN 13 04/06/2016 1448   BUN 27.9 (H) 07/05/2015 1010   CREATININE 0.76 04/06/2016 1448   CREATININE 0.8 07/05/2015 1010      Component Value Date/Time   CALCIUM 9.6 04/06/2016 1448   CALCIUM 10.0 07/05/2015 1010   ALKPHOS 81 07/05/2015 1010   AST 14 07/05/2015 1010   ALT 16 07/05/2015  1010   BILITOT 0.74 07/05/2015 1010       Lab Results  Component Value Date   WBC 5.7 04/06/2016   HGB 11.9 (L) 04/06/2016   HCT 36.9 04/06/2016   MCV 88.7 04/06/2016   PLT 298 04/06/2016   NEUTROABS 3.8 07/05/2015     ASSESSMENT & PLAN:  Breast cancer of upper-outer quadrant of right female breast Right breast mastectomy 01/27/2015: 2.2 cm grade 2 invasive ductal carcinoma ER 100%, PR 0%, HER-2 negative, Ki-67 21%, margins negative, sentinel lymph nodes negative Oncotype score 25; 16 % risk of recurrence with tamoxifen alone; status post adjuvant chemotherapy with Taxotere and Cytoxan started 03/03/2015 completed 05/04/2015, started anastrozole 06/07/2015  Anastrozole toxicities: No major side effects anastrozole. 1. Minimal hot flashes. 2. Denies any myalgias.  Survivorship: Patient goes to the [facility] and does water  exercises in [place]. She stays very active and busy with her work.  Cataract surgery complication in the left eye: Patient apparently had dislocation of the lens into the vitreous and she had to have a vitrectomy and she is still waiting to see how it improves and has ophthalmology follow-ups. She is unable to open her left eye.  Breast Cancer Surveillance: 1. Breast exam 07/03/16: Benign 2. Mammograms to be done annually from June 2017   Return to clinic in 6 months for follow-up and after that we can see her once a year.  No orders of the defined types were placed in this encounter.  The patient has a good understanding of the overall plan. she agrees with it. she will call with any problems that may develop before the next visit here.   Rulon Eisenmenger, MD 07/03/16

## 2016-07-03 NOTE — Assessment & Plan Note (Signed)
Right breast mastectomy 01/27/2015: 2.2 cm grade 2 invasive ductal carcinoma ER 100%, PR 0%, HER-2 negative, Ki-67 21%, margins negative, sentinel lymph nodes negative Oncotype score 25; 16 % risk of recurrence with tamoxifen alone; status post adjuvant chemotherapy with Taxotere and Cytoxan started 03/03/2015 completed 05/04/2015, started anastrozole 06/07/2015  Anastrozole toxicities: No major side effects anastrozole. 1. Complains of hot flashes quite often throughout the day but it has not been affecting her quality of life. 2. Denies any myalgias.  Survivorship: Patient goes to the [facility] and does water exercises in [place]. She stays very active and busy with her work.  Breast Cancer Surveillance: 1. Breast exam 07/03/16: Benign 2. Mammograms to be done annually from June 2017   Return to clinic in 6 months for follow-up

## 2016-07-03 NOTE — Telephone Encounter (Signed)
appt made and avs printed °

## 2016-09-12 ENCOUNTER — Other Ambulatory Visit: Payer: Self-pay | Admitting: Cardiovascular Disease

## 2016-10-19 ENCOUNTER — Emergency Department (HOSPITAL_COMMUNITY): Payer: 59

## 2016-10-19 ENCOUNTER — Encounter (HOSPITAL_COMMUNITY): Payer: Self-pay | Admitting: Emergency Medicine

## 2016-10-19 ENCOUNTER — Emergency Department (HOSPITAL_COMMUNITY)
Admission: EM | Admit: 2016-10-19 | Discharge: 2016-10-19 | Disposition: A | Discharge status: Home / Self Care | Payer: 59 | Attending: Emergency Medicine | Admitting: Emergency Medicine

## 2016-10-19 DIAGNOSIS — Z87891 Personal history of nicotine dependence: Secondary | ICD-10-CM | POA: Insufficient documentation

## 2016-10-19 DIAGNOSIS — I2510 Atherosclerotic heart disease of native coronary artery without angina pectoris: Secondary | ICD-10-CM | POA: Diagnosis not present

## 2016-10-19 DIAGNOSIS — E119 Type 2 diabetes mellitus without complications: Secondary | ICD-10-CM | POA: Insufficient documentation

## 2016-10-19 DIAGNOSIS — Z853 Personal history of malignant neoplasm of breast: Secondary | ICD-10-CM | POA: Diagnosis not present

## 2016-10-19 DIAGNOSIS — Z955 Presence of coronary angioplasty implant and graft: Secondary | ICD-10-CM | POA: Insufficient documentation

## 2016-10-19 DIAGNOSIS — I252 Old myocardial infarction: Secondary | ICD-10-CM | POA: Insufficient documentation

## 2016-10-19 DIAGNOSIS — J209 Acute bronchitis, unspecified: Secondary | ICD-10-CM

## 2016-10-19 DIAGNOSIS — J9801 Acute bronchospasm: Secondary | ICD-10-CM

## 2016-10-19 DIAGNOSIS — Z7982 Long term (current) use of aspirin: Secondary | ICD-10-CM | POA: Diagnosis not present

## 2016-10-19 DIAGNOSIS — I10 Essential (primary) hypertension: Secondary | ICD-10-CM | POA: Diagnosis not present

## 2016-10-19 DIAGNOSIS — R05 Cough: Secondary | ICD-10-CM | POA: Diagnosis present

## 2016-10-19 LAB — CBC WITH DIFFERENTIAL/PLATELET
Basophils Absolute: 0 10*3/uL (ref 0.0–0.1)
Basophils Relative: 0 %
EOS ABS: 0.3 10*3/uL (ref 0.0–0.7)
EOS PCT: 2 %
HCT: 37.5 % (ref 36.0–46.0)
Hemoglobin: 11.8 g/dL — ABNORMAL LOW (ref 12.0–15.0)
LYMPHS PCT: 14 %
Lymphs Abs: 1.7 10*3/uL (ref 0.7–4.0)
MCH: 27.8 pg (ref 26.0–34.0)
MCHC: 31.5 g/dL (ref 30.0–36.0)
MCV: 88.2 fL (ref 78.0–100.0)
MONO ABS: 1.1 10*3/uL — AB (ref 0.1–1.0)
Monocytes Relative: 9 %
Neutro Abs: 9.4 10*3/uL — ABNORMAL HIGH (ref 1.7–7.7)
Neutrophils Relative %: 75 %
PLATELETS: 400 10*3/uL (ref 150–400)
RBC: 4.25 MIL/uL (ref 3.87–5.11)
RDW: 13.7 % (ref 11.5–15.5)
WBC: 12.6 10*3/uL — AB (ref 4.0–10.5)

## 2016-10-19 LAB — COMPREHENSIVE METABOLIC PANEL
ALT: 25 U/L (ref 14–54)
ANION GAP: 10 (ref 5–15)
AST: 17 U/L (ref 15–41)
Albumin: 3.1 g/dL — ABNORMAL LOW (ref 3.5–5.0)
Alkaline Phosphatase: 101 U/L (ref 38–126)
BUN: 11 mg/dL (ref 6–20)
CALCIUM: 9.6 mg/dL (ref 8.9–10.3)
CHLORIDE: 100 mmol/L — AB (ref 101–111)
CO2: 32 mmol/L (ref 22–32)
CREATININE: 0.86 mg/dL (ref 0.44–1.00)
Glucose, Bld: 124 mg/dL — ABNORMAL HIGH (ref 65–99)
Potassium: 3.8 mmol/L (ref 3.5–5.1)
Sodium: 142 mmol/L (ref 135–145)
Total Bilirubin: 0.5 mg/dL (ref 0.3–1.2)
Total Protein: 6.8 g/dL (ref 6.5–8.1)

## 2016-10-19 LAB — I-STAT TROPONIN, ED: Troponin i, poc: 0 ng/mL (ref 0.00–0.08)

## 2016-10-19 MED ORDER — ALBUTEROL SULFATE HFA 108 (90 BASE) MCG/ACT IN AERS
2.0000 | INHALATION_SPRAY | Freq: Once | RESPIRATORY_TRACT | Status: AC
  Administered 2016-10-19: 2 via RESPIRATORY_TRACT
  Filled 2016-10-19: qty 6.7

## 2016-10-19 MED ORDER — PREDNISONE 10 MG PO TABS: 50.0000 mg | ORAL_TABLET | Freq: Every day | ORAL | 0 refills | Status: AC

## 2016-10-19 MED ORDER — ALBUTEROL SULFATE (2.5 MG/3ML) 0.083% IN NEBU
5.0000 mg | INHALATION_SOLUTION | Freq: Once | RESPIRATORY_TRACT | Status: AC
  Administered 2016-10-19: 5 mg via RESPIRATORY_TRACT
  Filled 2016-10-19: qty 6

## 2016-10-19 MED ORDER — DOXYCYCLINE HYCLATE 100 MG PO TABS
100.0000 mg | ORAL_TABLET | Freq: Once | ORAL | Status: AC
  Administered 2016-10-19: 100 mg via ORAL
  Filled 2016-10-19: qty 1

## 2016-10-19 MED ORDER — AEROCHAMBER PLUS FLO-VU MEDIUM MISC
1.0000 | Freq: Once | Status: AC
  Administered 2016-10-19: 1
  Filled 2016-10-19: qty 1

## 2016-10-19 MED ORDER — PREDNISONE 20 MG PO TABS
60.0000 mg | ORAL_TABLET | Freq: Once | ORAL | Status: AC
  Administered 2016-10-19: 60 mg via ORAL
  Filled 2016-10-19: qty 3

## 2016-10-19 MED ORDER — ALBUTEROL (5 MG/ML) CONTINUOUS INHALATION SOLN
15.0000 mg/h | INHALATION_SOLUTION | Freq: Once | RESPIRATORY_TRACT | Status: AC
  Administered 2016-10-19: 15 mg/h via RESPIRATORY_TRACT
  Filled 2016-10-19: qty 20

## 2016-10-19 MED ORDER — DOXYCYCLINE HYCLATE 100 MG PO CAPS: 100.0000 mg | ORAL_CAPSULE | Freq: Two times a day (BID) | ORAL | 0 refills | Status: DC

## 2016-10-19 MED ORDER — IPRATROPIUM BROMIDE 0.02 % IN SOLN
0.5000 mg | Freq: Once | RESPIRATORY_TRACT | Status: AC
  Administered 2016-10-19: 0.5 mg via RESPIRATORY_TRACT
  Filled 2016-10-19: qty 2.5

## 2016-10-19 NOTE — ED Triage Notes (Addendum)
Pt. reports persistent productive cough with chest congestion / SOB and fever onset last week , diagnosed by her PCP with URI ( viral ) prescribed with cough medication with no relief.

## 2016-10-19 NOTE — ED Notes (Signed)
Pt ambulated around Pod A, pt's sats stayed at 89-90% on room air-- pt became dyspneic, unable to speak in complete sentences. HR = 134. Resp 28-30. Dr. Laneta Simmers made aware.

## 2016-10-19 NOTE — ED Notes (Signed)
On discharge, pt resp easier, able to speak in sentences. Stressed the importance of returning if condition worsens. Pt verbalized understanding,

## 2016-10-19 NOTE — ED Notes (Signed)
Pt is unable to lay down, remains sitting on side of bed-- O2 sats are in high 80's- low 90's -- is able to speak in complete sentences.

## 2016-10-19 NOTE — ED Provider Notes (Signed)
[facility] DEPT Provider Note   CSN: [id number] Arrival date & time: 10/19/16  0353     History   Chief Complaint Chief Complaint  Patient presents with  . Cough    Chest Congestion / URI    HPI Mariah Kelley is a 65 y.o. female.  She presents for evaluation of ongoing cough productive of sputum for 2 weeks, not improving with codeine medication. Ex-smoker, stopped about 7 years ago. She's had low-grade fever at home. She denies nausea, vomiting, chest pain, weakness or dizziness. She does not currently take medications for bronchitis or COPD. No prior diagnosis of chronic respiratory illness. There are no other known modifying factors.  HPI  Past Medical History:  Diagnosis Date  . Arthritis   . Breast cancer of upper-outer quadrant of right female breast ([place]) 12/23/2014  . Bruises easily   . CAD S/P percutaneous coronary angioplasty 01/17/2014   takes Brilinta and Plavix daily  . Cataracts, bilateral   . Diabetes mellitus without complication ([place])   . History of bronchitis 7-76yrs ago  . History of tobacco abuse    40 pack-years  . Hyperlipidemia    takes Lipitor daily  . Hypertension    takes Metoprolol daily  . Joint pain   . Obesity   . ST elevation myocardial infarction (STEMI) of inferolateral wall ([place]) 01/17/2014    Patient Active Problem List   Diagnosis Date Noted  . Breast cancer of upper-outer quadrant of right female breast ([place]) 12/23/2014  . Metabolic syndrome   . Diabetes ([place]) 01/18/2014  . Dyslipidemia 01/18/2014  . HTN (hypertension) 01/18/2014  . ST elevation myocardial infarction (STEMI) of inferolateral wall ([place]) 01/17/2014  . CAD S/P percutaneous coronary angioplasty - inferior STEMI: Mid RCA PCI Promus DES 3.0 mm x 12 mm (3.5 mm) 01/17/2014    Past Surgical History:  Procedure Laterality Date  . ABDOMINAL HYSTERECTOMY    . APPENDECTOMY    . BREAST REDUCTION SURGERY Left 01/27/2015  . BREAST REDUCTION SURGERY Left  01/27/2015   Procedure: MAMMARY REDUCTION  LEFT (BREAST);  Surgeon: Irene Limbo, MD;  Location: [facility];  Service: Plastics;  Laterality: Left;  . CATARACT EXTRACTION W/PHACO Right 04/11/2016   Procedure: CATARACT EXTRACTION PHACO AND INTRAOCULAR LENS PLACEMENT (IOC);  Surgeon: Rutherford Guys, MD;  Location: [facility];  Service: Ophthalmology;  Laterality: Right;  CDE: 14.15  . CATARACT EXTRACTION W/PHACO Left 05/02/2016   Procedure: CATARACT EXTRACTION PHACO AND INTRAOCULAR LENS PLACEMENT (IOC);  Surgeon: Rutherford Guys, MD;  Location: [facility];  Service: Ophthalmology;  Laterality: Left;  CDE: 12.11  . CHOLECYSTECTOMY    . COLONOSCOPY    . LEFT HEART CATH Bilateral 01/17/2014   Procedure: LEFT HEART CATH;  Surgeon: Lorretta Harp, MD;  Location: [facility] CATH LAB;  Service: Cardiovascular;  Laterality: Bilateral;  . MASTECTOMY W/ SENTINEL NODE BIOPSY Right 01/27/2015   dr Lucia Gaskins   . MASTECTOMY W/ SENTINEL NODE BIOPSY Right 01/27/2015   Procedure: RIGHT MASTECTOMY WITH RIGHT AXILLARY  SENTINEL LYMPH NODE BIOPSY;  Surgeon: Alphonsa Overall, MD;  Location: [facility];  Service: General;  Laterality: Right;  . PERCUTANEOUS CORONARY STENT INTERVENTION (PCI-S)    . PORTACATH PLACEMENT Left 03/23/2015   Procedure: INSERTION PORT-A-CATH ;  Surgeon: Alphonsa Overall, MD;  Location: [facility];  Service: General;  Laterality: Left;  . SPINAL FUSION    . TUBAL LIGATION      OB History    No data available       Home Medications  Prior to Admission medications   Medication Sig Start Date End Date Taking? Authorizing Provider  anastrozole (ARIMIDEX) 1 MG tablet TAKE 1 TABLET (1 MG TOTAL) BY MOUTH DAILY. 06/12/16  Yes Nicholas Lose, MD  aspirin EC 81 MG tablet Take 81 mg by mouth daily.   Yes Historical Provider, MD  atorvastatin (LIPITOR) 80 MG tablet Take 1 tablet (80 mg total) by mouth daily at 6 PM. NEED OV. 05/12/16  Yes Lorretta Harp, MD  cholecalciferol (VITAMIN D) 1000 UNITS tablet Take 2,000 Units by mouth daily.   Yes  Historical Provider, MD  clopidogrel (PLAVIX) 75 MG tablet Take 1 tablet (75 mg total) by mouth daily. 09/12/16  Yes Lorretta Harp, MD  metoprolol (LOPRESSOR) 50 MG tablet Take 1 tablet (50 mg total) by mouth 2 (two) times daily. 05/22/16  Yes Lorretta Harp, MD  nitroGLYCERIN (NITROSTAT) 0.4 MG SL tablet Place 1 tablet (0.4 mg total) under the tongue every 5 (five) minutes as needed for chest pain. 04/15/15  Yes Lorretta Harp, MD  promethazine-codeine (PHENERGAN WITH CODEINE) 6.25-10 MG/5ML syrup Take 5-10 mLs by mouth 3 (three) times daily as needed for cough.  10/13/16  Yes Historical Provider, MD    Family History Family History  Problem Relation Age of Onset  . Heart attack Mother 97    passed at 54  . Heart attack Father 15    s/p CABG, multiple PCIs  . CAD Brother     Still living  . CAD Brother     Still living    Social History Social History  Substance Use Topics  . Smoking status: Former Smoker    Packs/day: 1.00    Years: 40.00    Types: Cigarettes    Quit date: 07/30/2009  . Smokeless tobacco: Former Systems developer     Comment: quit 5 yrs ago  . Alcohol use No     Allergies   Codeine and Prednisone   Review of Systems Review of Systems  All other systems reviewed and are negative.    Physical Exam Updated Vital Signs BP 117/70 (BP Location: Left Arm)   Pulse 99   Temp 98.3 F (36.8 C) (Oral)   Resp 17   SpO2 92%   Physical Exam  Constitutional: She is oriented to person, place, and time. She appears well-developed and well-nourished. She appears distressed (She is uncomfortable).  HENT:  Head: Normocephalic and atraumatic.  Eyes: Conjunctivae and EOM are normal. Pupils are equal, round, and reactive to light.  Neck: Normal range of motion and phonation normal. Neck supple.  Cardiovascular: Normal rate and regular rhythm.   Pulmonary/Chest: Effort normal. She exhibits no tenderness.  Somewhat decreased air movement bilaterally with scattered wheezes  and rhonchi. There is no increased work of breathing.  Abdominal: Soft. She exhibits no distension. There is no tenderness. There is no guarding.  Musculoskeletal: Normal range of motion. She exhibits edema (1+ bilateral lower legs.).  Neurological: She is alert and oriented to person, place, and time. She exhibits normal muscle tone.  Skin: Skin is warm and dry.  Psychiatric: She has a normal mood and affect. Her behavior is normal. Judgment and thought content normal.  Nursing note and vitals reviewed.    ED Treatments / Results  Labs (all labs ordered are listed, but only abnormal results are displayed) Labs Reviewed  CBC WITH DIFFERENTIAL/PLATELET - Abnormal; Notable for the following:       Result Value   WBC 12.6 (*)  Hemoglobin 11.8 (*)    Neutro Abs 9.4 (*)    Monocytes Absolute 1.1 (*)    All other components within normal limits  COMPREHENSIVE METABOLIC PANEL - Abnormal; Notable for the following:    Chloride 100 (*)    Glucose, Bld 124 (*)    Albumin 3.1 (*)    All other components within normal limits  I-STAT TROPOININ, ED    EKG  EKG Interpretation  Date/Time:  Thursday October 19 2016 04:05:01 EST Ventricular Rate:  105 PR Interval:  148 QRS Duration: 84 QT Interval:  352 QTC Calculation: 465 R Axis:   57 Text Interpretation:  Sinus tachycardia Otherwise normal ECG Since last tracing rate faster Confirmed by Eulis Foster  MD, Vira Agar [id number]) on 10/19/2016 5:06:10 AM Also confirmed by Eulis Foster  MD, Syniyah Bourne [phone]), editor [place]  CCT, [place] ([id number])  on 10/19/2016 7:27:39 AM       Radiology Dg Chest 2 View  Result Date: 10/19/2016 CLINICAL DATA:  65 year old female with shortness of breath and productive cough. EXAM: CHEST  2 VIEW COMPARISON:  Chest CT dated 04/05/2015 and radiograph dated 04/04/2015 FINDINGS: Two views of the chest do not demonstrate a focal consolidation. There is no pleural effusion or pneumothorax. There is mild diffuse interstitial  coarsening and nodularity new from prior radiographs which may be related to bronchial thickening representing bronchitis or an atypical or viral infection. Correlation with clinical exam recommended. The cardiac silhouette is within normal limits. No acute osseous pathology identified. IMPRESSION: Diffuse interstitial and peribronchial coarsening and nodularity may represent bronchitis or an atypical/viral infection. Clinical correlation is recommended. No focal consolidation. Electronically Signed   By: Anner Crete M.D.   On: 10/19/2016 04:45    Procedures Procedures (including critical care time)  Medications Ordered in ED Medications  albuterol (PROVENTIL) (2.5 MG/3ML) 0.083% nebulizer solution 5 mg (5 mg Nebulization Given 10/19/16 0559)  ipratropium (ATROVENT) nebulizer solution 0.5 mg (0.5 mg Nebulization Given 10/19/16 0559)  predniSONE (DELTASONE) tablet 60 mg (60 mg Oral Given 10/19/16 0559)  doxycycline (VIBRA-TABS) tablet 100 mg (100 mg Oral Given 10/19/16 0559)     Initial Impression / Assessment and Plan / ED Course  I have reviewed the triage vital signs and the nursing notes.  Pertinent labs & imaging results that were available during my care of the patient were reviewed by me and considered in my medical decision making (see chart for details).  Clinical Course as of Oct 19 758  Thu Oct 19, 2016  0505 [facility]: 27.8 [EW]  0506 Nonspecific interstitial disease DG Chest 2 View [EW]    Clinical Course User Index [EW] Daleen Bo, MD    Medications  albuterol (PROVENTIL,VENTOLIN) solution continuous neb (not administered)  albuterol (PROVENTIL) (2.5 MG/3ML) 0.083% nebulizer solution 5 mg (5 mg Nebulization Given 10/19/16 0559)  ipratropium (ATROVENT) nebulizer solution 0.5 mg (0.5 mg Nebulization Given 10/19/16 0559)  predniSONE (DELTASONE) tablet 60 mg (60 mg Oral Given 10/19/16 0559)  doxycycline (VIBRA-TABS) tablet 100 mg (100 mg Oral Given 10/19/16 0559)     Patient Vitals for the past 24 hrs:  BP Temp Temp src Pulse Resp SpO2  10/19/16 0749 117/70 - - 99 17 92 %  10/19/16 0745 - - - 113 20 90 %  10/19/16 0715 128/69 - - 111 18 91 %  10/19/16 0700 136/68 - - 116 24 92 %  10/19/16 0645 125/80 - - 117 26 93 %  10/19/16 0615 (!) 130/113 - - 101 20 100 %  10/19/16 0600 129/77 - - 106 20 90 %  10/19/16 0545 144/75 - - 100 (!) 28 97 %  10/19/16 0530 (!) 134/54 - - 97 26 91 %  10/19/16 0515 125/74 - - 100 (!) 29 (!) 89 %  10/19/16 0406 146/85 98.3 F (36.8 C) Oral 102 18 94 %    8:02 AM Reevaluation with update and discussion. After initial assessment and treatment, an updated evaluation reveals After initial treatment, oxygen saturations have dropped to the low 90s. Patient supplemented with oxygen and continuous nebulizer ordered. Henna Derderian L    Final Clinical Impressions(s) / ED Diagnoses   Final diagnoses:  Acute bronchitis, unspecified organism  Bronchospasm   Nursing Notes Reviewed/ Care Coordinated Applicable Imaging Reviewed Interpretation of Laboratory Data incorporated into ED treatment  Plan: Dr. Laneta Simmers will reeval. After Continuous nebulizer treatment, and consider disposition.   New Prescriptions New Prescriptions   No medications on file     Daleen Bo, MD 10/19/16 [phone]

## 2016-10-19 NOTE — ED Provider Notes (Signed)
Care assumed from Dr. Eulis Foster at 0800 with plan for reassessment after albuterol.   Results:  BP 117/70 (BP Location: Left Arm)   Pulse 99   Temp 98.3 F (36.8 C) (Oral)   Resp 17   SpO2 94%   Results for orders placed or performed during the hospital encounter of 10/19/16  CBC with Differential  Result Value Ref Range   WBC 12.6 (H) 4.0 - 10.5 K/uL   RBC 4.25 3.87 - 5.11 MIL/uL   Hemoglobin 11.8 (L) 12.0 - 15.0 g/dL   HCT 37.5 36.0 - 46.0 %   MCV 88.2 78.0 - 100.0 fL   MCH 27.8 26.0 - 34.0 pg   MCHC 31.5 30.0 - 36.0 g/dL   RDW 13.7 11.5 - 15.5 %   Platelets 400 150 - 400 K/uL   Neutrophils Relative % 75 %   Neutro Abs 9.4 (H) 1.7 - 7.7 K/uL   Lymphocytes Relative 14 %   Lymphs Abs 1.7 0.7 - 4.0 K/uL   Monocytes Relative 9 %   Monocytes Absolute 1.1 (H) 0.1 - 1.0 K/uL   Eosinophils Relative 2 %   Eosinophils Absolute 0.3 0.0 - 0.7 K/uL   Basophils Relative 0 %   Basophils Absolute 0.0 0.0 - 0.1 K/uL  Comprehensive metabolic panel  Result Value Ref Range   Sodium 142 135 - 145 mmol/L   Potassium 3.8 3.5 - 5.1 mmol/L   Chloride 100 (L) 101 - 111 mmol/L   CO2 32 22 - 32 mmol/L   Glucose, Bld 124 (H) 65 - 99 mg/dL   BUN 11 6 - 20 mg/dL   Creatinine, Ser 0.86 0.44 - 1.00 mg/dL   Calcium 9.6 8.9 - 10.3 mg/dL   Total Protein 6.8 6.5 - 8.1 g/dL   Albumin 3.1 (L) 3.5 - 5.0 g/dL   AST 17 15 - 41 U/L   ALT 25 14 - 54 U/L   Alkaline Phosphatase 101 38 - 126 U/L   Total Bilirubin 0.5 0.3 - 1.2 mg/dL   GFR calc non Af Amer >60 >60 mL/min   GFR calc Af Amer >60 >60 mL/min   Anion gap 10 5 - 15  I-Stat Troponin, ED (not at [facility])  Result Value Ref Range   Troponin i, poc 0.00 0.00 - 0.08 ng/mL   Comment 3            Dg Chest 2 View  Result Date: 10/19/2016 CLINICAL DATA:  65 year old female with shortness of breath and productive cough. EXAM: CHEST  2 VIEW COMPARISON:  Chest CT dated 04/05/2015 and radiograph dated 04/04/2015 FINDINGS: Two views of the chest do not  demonstrate a focal consolidation. There is no pleural effusion or pneumothorax. There is mild diffuse interstitial coarsening and nodularity new from prior radiographs which may be related to bronchial thickening representing bronchitis or an atypical or viral infection. Correlation with clinical exam recommended. The cardiac silhouette is within normal limits. No acute osseous pathology identified. IMPRESSION: Diffuse interstitial and peribronchial coarsening and nodularity may represent bronchitis or an atypical/viral infection. Clinical correlation is recommended. No focal consolidation. Electronically Signed   By: Anner Crete M.D.   On: 10/19/2016 04:45    Radiology and laboratory examinations were reviewed by me and used in medical decision making if performed.   MDM:  Pt improved breathing but feeling jittery from albuterol treatment. Will cover with steroids and doxy for atypicals as discussed at handoff. No hypoxemia with ambulation. Plan to follow up with PCP  as needed and return precautions discussed for worsening or new concerning symptoms.   Diagnoses that have been ruled out:  None  Diagnoses that are still under consideration:  None  Final diagnoses:  Acute bronchitis, unspecified organism  Bronchospasm      Leo Grosser, MD 10/19/16 [phone]

## 2016-11-17 ENCOUNTER — Other Ambulatory Visit (HOSPITAL_COMMUNITY): Payer: Self-pay | Admitting: Respiratory Therapy

## 2016-11-17 DIAGNOSIS — J45909 Unspecified asthma, uncomplicated: Secondary | ICD-10-CM

## 2016-11-17 DIAGNOSIS — Z87891 Personal history of nicotine dependence: Secondary | ICD-10-CM

## 2016-11-22 ENCOUNTER — Other Ambulatory Visit: Payer: Self-pay | Admitting: Nurse Practitioner

## 2016-11-24 ENCOUNTER — Ambulatory Visit (HOSPITAL_COMMUNITY)
Admission: RE | Admit: 2016-11-24 | Discharge: 2016-11-24 | Disposition: A | Discharge status: Home / Self Care | Payer: 59 | Source: Ambulatory Visit | Attending: Family Medicine | Admitting: Family Medicine

## 2016-11-24 DIAGNOSIS — Z87891 Personal history of nicotine dependence: Secondary | ICD-10-CM | POA: Insufficient documentation

## 2016-11-24 MED ORDER — METHACHOLINE 0.0625 MG/ML NEB SOLN
2.0000 mL | Freq: Once | RESPIRATORY_TRACT | Status: AC
  Administered 2016-11-24: 0.125 mg via RESPIRATORY_TRACT
  Filled 2016-11-24: qty 2

## 2016-11-24 MED ORDER — SODIUM CHLORIDE 0.9 % IN NEBU
3.0000 mL | INHALATION_SOLUTION | Freq: Once | RESPIRATORY_TRACT | Status: AC
  Administered 2016-11-24: 3 mL via RESPIRATORY_TRACT
  Filled 2016-11-24: qty 3

## 2016-11-24 MED ORDER — ALBUTEROL SULFATE (2.5 MG/3ML) 0.083% IN NEBU
2.5000 mg | INHALATION_SOLUTION | Freq: Once | RESPIRATORY_TRACT | Status: AC
  Administered 2016-11-24: 2.5 mg via RESPIRATORY_TRACT

## 2016-11-24 MED ORDER — METHACHOLINE 4 MG/ML NEB SOLN
2.0000 mL | Freq: Once | RESPIRATORY_TRACT | Status: DC
  Filled 2016-11-24: qty 2

## 2016-11-24 MED ORDER — METHACHOLINE 1 MG/ML NEB SOLN
2.0000 mL | Freq: Once | RESPIRATORY_TRACT | Status: AC
  Administered 2016-11-24: 2 mg via RESPIRATORY_TRACT
  Filled 2016-11-24: qty 2

## 2016-11-24 MED ORDER — METHACHOLINE 16 MG/ML NEB SOLN
2.0000 mL | Freq: Once | RESPIRATORY_TRACT | Status: DC
  Filled 2016-11-24: qty 2

## 2016-11-24 MED ORDER — METHACHOLINE 0.25 MG/ML NEB SOLN
2.0000 mL | Freq: Once | RESPIRATORY_TRACT | Status: AC
  Administered 2016-11-24: 0.5 mg via RESPIRATORY_TRACT
  Filled 2016-11-24: qty 2

## 2016-12-12 ENCOUNTER — Other Ambulatory Visit: Payer: Self-pay | Admitting: Family Medicine

## 2016-12-12 DIAGNOSIS — F17210 Nicotine dependence, cigarettes, uncomplicated: Secondary | ICD-10-CM

## 2016-12-13 ENCOUNTER — Ambulatory Visit
Admission: RE | Admit: 2016-12-13 | Discharge: 2016-12-13 | Disposition: A | Discharge status: Home / Self Care | Payer: 59 | Source: Ambulatory Visit | Attending: Family Medicine | Admitting: Family Medicine

## 2016-12-13 DIAGNOSIS — F17210 Nicotine dependence, cigarettes, uncomplicated: Secondary | ICD-10-CM

## 2016-12-26 ENCOUNTER — Telehealth: Payer: Self-pay | Admitting: Cardiovascular Disease

## 2016-12-26 NOTE — Telephone Encounter (Signed)
New Message   Request for surgical clearance:  1. What type of surgery is being performed? Biopsy  2. When is this surgery scheduled? 01/01/17  3. Are there any medications that need to be held prior to surgery and how long? Wants to know if pt can hold plavix  4. Name of physician performing surgery? Christene Slates with Radiology   5. What is your office phone and fax number?   [phone]

## 2016-12-28 NOTE — Telephone Encounter (Signed)
Follow up   Jessica from [facility] is calling to follow up about surgical clearance. She states pt is scheduled for Monday.

## 2016-12-28 NOTE — Telephone Encounter (Signed)
,  placed call,awaiting for Dr Gwenlyn Found,

## 2016-12-29 ENCOUNTER — Telehealth: Payer: Self-pay | Admitting: Cardiovascular Disease

## 2016-12-29 NOTE — Telephone Encounter (Signed)
Okay to interrupt antiplatelet therapy for biopsy

## 2016-12-29 NOTE — Telephone Encounter (Signed)
LMTCB

## 2016-12-29 NOTE — Telephone Encounter (Signed)
New Message     Have you gotten approval for pt to stop taking blood thinner yet?

## 2016-12-29 NOTE — Telephone Encounter (Signed)
Addressed in another telephone encounter. This encounter will be closed

## 2016-12-29 NOTE — Telephone Encounter (Signed)
Spoke with Mariah Kelley who needs clarification on length of time that antiplatelets needs to be held.   Consulted with Dr. Gwenlyn Found in the office today - hold plavix for 5 days & aspirin for 7 days prior to procedure  Returned call to [facility] with this information & faxed note to number provided.

## 2017-01-01 ENCOUNTER — Other Ambulatory Visit: Payer: Self-pay | Admitting: Radiology

## 2017-01-02 NOTE — Progress Notes (Signed)
Received solis 3d mm/us. Fu appt scheduled.

## 2017-01-03 ENCOUNTER — Ambulatory Visit (HOSPITAL_BASED_OUTPATIENT_CLINIC_OR_DEPARTMENT_OTHER): Payer: 59 | Admitting: Hematology and Oncology

## 2017-01-03 ENCOUNTER — Encounter: Payer: Self-pay | Admitting: Hematology and Oncology

## 2017-01-03 DIAGNOSIS — Z170 Estrogen receptor positive status [ER+]: Secondary | ICD-10-CM | POA: Diagnosis not present

## 2017-01-03 DIAGNOSIS — C50411 Malignant neoplasm of upper-outer quadrant of right female breast: Secondary | ICD-10-CM

## 2017-01-03 MED ORDER — ALBUTEROL SULFATE HFA 108 (90 BASE) MCG/ACT IN AERS: 2.0000 | INHALATION_SPRAY | Freq: Four times a day (QID) | RESPIRATORY_TRACT | 2 refills | Status: DC | PRN

## 2017-01-03 MED ORDER — BUDESONIDE-FORMOTEROL FUMARATE 160-4.5 MCG/ACT IN AERO: 2.0000 | INHALATION_SPRAY | Freq: Every day | RESPIRATORY_TRACT | 12 refills | Status: DC | PRN

## 2017-01-03 MED ORDER — ANASTROZOLE 1 MG PO TABS: 1.0000 mg | ORAL_TABLET | Freq: Every day | ORAL | 3 refills | Status: DC

## 2017-01-03 NOTE — Progress Notes (Signed)
Patient Care Team: Jonathon Jordan, MD as PCP - General (Family Medicine) Alphonsa Overall, MD as Consulting Physician (General Surgery) Nicholas Lose, MD as Consulting Physician (Hematology and Oncology) Thea Silversmith, MD (Inactive) as Consulting Physician (Radiation Oncology) Mauro Kaufmann, RN as Registered Nurse Rockwell Germany, RN as Registered Nurse Holley Bouche, NP as Nurse Practitioner (Nurse Practitioner) Irene Limbo, MD as Consulting Physician (Plastic Surgery) Sylvan Cheese, NP as Nurse Practitioner (Nurse Practitioner)  DIAGNOSIS:  Encounter Diagnosis  Name Primary?  . Malignant neoplasm of upper-outer quadrant of right breast in female, estrogen receptor positive ([place])     SUMMARY OF ONCOLOGIC HISTORY:   Malignant neoplasm of upper-outer quadrant of right breast in female, estrogen receptor positive ([place])   04/29/2014 Procedure    Prior right breast bx: fibrocystic changes with calcifications, no evidence of malignancy.      12/11/2014 Mammogram    Right breast: focal asymmetric density with indistinct margin at 11:00 middle depth. Also area of grouped heterogenous calcifications at 12:00, middle depth, increased in number.      12/11/2014 Breast US    Right breast: 2 cm round mass with irregular margin in UOQ, posterior depth; hypoechoic with posterior acoustic enhancement; lobulated LN with focal cortex thickening in right axilla      12/17/2014 Initial Biopsy    Right breast biopsy: Invasive ductal carcinoma with DCIS, right axillary lymph node benign      12/24/2014 Procedure    Right breast needle core bx: at least atypical ductal hyperplasia with associated calcifications - suspicious for DCIS      12/29/2014 Breast MRI    2.1 cm mass in the upper-outer quadrant of the right breast 2 possible satellite nodules associated with this mass. NME 2.2cm, together 5.1 cm      12/29/2014 Clinical Stage    Stage IIA: T2 N0      01/27/2015 Definitive  Surgery    Right breast mastectomy/SLNB Lucia Gaskins): invasive ductal carcinoma, grade 2, with DCIS (int. grade), spanning 2.2 cm, ER+ (100%), PR- (0%,) HER-2 negative (ratio 1.53), Ki-67 21%. 1 LN negative for malignancy (0/1)      01/27/2015 Pathologic Stage    Stage IIA: pT2 pN0      01/27/2015 Oncotype testing    Score: 25 (16% ROR)      03/03/2015 - 05/04/2015 Chemotherapy    Adjuvant chemotherapy with docetaxel and cyclophosphamide 4 ([place]).      06/04/2015 -  Anti-estrogen oral therapy    Anastrozole 1 mg daily. Planned duration of therapy 5 years Lindi Adie)      08/25/2015 Survivorship    Survivorship care plan completed and mailed to patient in lieu of in person visit       CHIEF COMPLIANT: Follow-up on anastrozole therapy  INTERVAL HISTORY: Mariah Kelley is a 66 year old with above-mentioned history of right breast cancer treated with mastectomy followed by adjuvant chemotherapy and is currently on anastrozole. She is tolerating anastrozole extremely well. Recently she had a biopsy of the breast which was benign. Denies any hot flashes or myalgias. She is working out with the [employer] 5 days a week.  REVIEW OF SYSTEMS:   Constitutional: Denies fevers, chills or abnormal weight loss Eyes: Denies blurriness of vision Ears, nose, mouth, throat, and face: Denies mucositis or sore throat Respiratory: Denies cough, dyspnea or wheezes Cardiovascular: Denies palpitation, chest discomfort Gastrointestinal:  Denies nausea, heartburn or change in bowel habits Skin: Denies abnormal skin rashes Lymphatics: Denies new lymphadenopathy or easy  bruising Neurological:Denies numbness, tingling or new weaknesses Behavioral/Psych: Mood is stable, no new changes  Extremities: No lower extremity edema Breast:  denies any pain or lumps or nodules in either breasts All other systems were reviewed with the patient and are negative.  I have reviewed the past medical history, past  surgical history, social history and family history with the patient and they are unchanged from previous note.  ALLERGIES:  is allergic to codeine and prednisone.  MEDICATIONS:  Current Outpatient Prescriptions  Medication Sig Dispense Refill  . anastrozole (ARIMIDEX) 1 MG tablet TAKE 1 TABLET (1 MG TOTAL) BY MOUTH DAILY. 90 tablet 3  . aspirin EC 81 MG tablet Take 81 mg by mouth daily.    [name] atorvastatin (LIPITOR) 80 MG tablet Take 1 tablet (80 mg total) by mouth daily at 6 PM. NEED OV. 30 tablet 10  . cholecalciferol (VITAMIN D) 1000 UNITS tablet Take 2,000 Units by mouth daily.    . clopidogrel (PLAVIX) 75 MG tablet Take 1 tablet (75 mg total) by mouth daily. 90 tablet 1  . doxycycline (VIBRAMYCIN) 100 MG capsule Take 1 capsule (100 mg total) by mouth 2 (two) times daily. 20 capsule 0  . metoprolol (LOPRESSOR) 50 MG tablet Take 1 tablet (50 mg total) by mouth 2 (two) times daily. 60 tablet 10  . nitroGLYCERIN (NITROSTAT) 0.4 MG SL tablet Place 1 tablet (0.4 mg total) under the tongue every 5 (five) minutes as needed for chest pain. 25 tablet 3  . promethazine-codeine (PHENERGAN WITH CODEINE) 6.25-10 MG/5ML syrup Take 5-10 mLs by mouth 3 (three) times daily as needed for cough.   0   No current facility-administered medications for this visit.     PHYSICAL EXAMINATION: ECOG PERFORMANCE STATUS: 0 - Asymptomatic  There were no vitals filed for this visit. There were no vitals filed for this visit.  GENERAL:alert, no distress and comfortable SKIN: skin color, texture, turgor are normal, no rashes or significant lesions EYES: normal, Conjunctiva are pink and non-injected, sclera clear OROPHARYNX:no exudate, no erythema and lips, buccal mucosa, and tongue normal  NECK: supple, thyroid normal size, non-tender, without nodularity LYMPH:  no palpable lymphadenopathy in the cervical, axillary or inguinal LUNGS: clear to auscultation and percussion with normal breathing effort HEART: regular  rate & rhythm and no murmurs and no lower extremity edema ABDOMEN:abdomen soft, non-tender and normal bowel sounds MUSCULOSKELETAL:no cyanosis of digits and no clubbing  NEURO: alert & oriented x 3 with fluent speech, no focal motor/sensory deficits EXTREMITIES: No lower extremity edema BREAST: No palpable masses or nodules in either right or left breasts. No palpable axillary supraclavicular or infraclavicular adenopathy no breast tenderness or nipple discharge. (exam performed in the presence of a chaperone)  LABORATORY DATA:  I have reviewed the data as listed   Chemistry      Component Value Date/Time   NA 142 10/19/2016 0413   NA 142 07/05/2015 1010   K 3.8 10/19/2016 0413   K 4.3 07/05/2015 1010   CL 100 (L) 10/19/2016 0413   CO2 32 10/19/2016 0413   CO2 28 07/05/2015 1010   BUN 11 10/19/2016 0413   BUN 27.9 (H) 07/05/2015 1010   CREATININE 0.86 10/19/2016 0413   CREATININE 0.8 07/05/2015 1010      Component Value Date/Time   CALCIUM 9.6 10/19/2016 0413   CALCIUM 10.0 07/05/2015 1010   ALKPHOS 101 10/19/2016 0413   ALKPHOS 81 07/05/2015 1010   AST 17 10/19/2016 0413   AST 14 07/05/2015  1010   ALT 25 10/19/2016 0413   ALT 16 07/05/2015 1010   BILITOT 0.5 10/19/2016 0413   BILITOT 0.74 07/05/2015 1010       Lab Results  Component Value Date   WBC 12.6 (H) 10/19/2016   HGB 11.8 (L) 10/19/2016   HCT 37.5 10/19/2016   MCV 88.2 10/19/2016   PLT 400 10/19/2016   NEUTROABS 9.4 (H) 10/19/2016    ASSESSMENT & PLAN:  Malignant neoplasm of upper-outer quadrant of right breast in female, estrogen receptor positive ([place]) Right breast mastectomy 01/27/2015: 2.2 cm grade 2 invasive ductal carcinoma ER 100%, PR 0%, HER-2 negative, Ki-67 21%, margins negative, sentinel lymph nodes negative Oncotype score 25; 16 % risk of recurrence with tamoxifen alone; status post adjuvant chemotherapy with Taxotere and Cytoxan started 03/03/2015 completed 05/04/2015, started anastrozole  06/07/2015  Anastrozole toxicities: No major side effects anastrozole. 1. Minimal hot flashes. 2. Denies any myalgias.  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 Breast exam 01/03/2017: Benign 2. Mammograms June 2017   Low-dose CT scan performed by pulmonary 12/13/2016: Negative, emphysema  Return to clinic in 1 year for follow-up    I spent 25 minutes talking to the patient of which more than half was spent in counseling and coordination of care.  No orders of the defined types were placed in this encounter.  The patient has a good understanding of the overall plan. she agrees with it. she will call with any problems that may develop before the next visit here.   Rulon Eisenmenger, MD 01/03/17

## 2017-01-03 NOTE — Assessment & Plan Note (Signed)
Right breast mastectomy 01/27/2015: 2.2 cm grade 2 invasive ductal carcinoma ER 100%, PR 0%, HER-2 negative, Ki-67 21%, margins negative, sentinel lymph nodes negative Oncotype score 25; 16 % risk of recurrence with tamoxifen alone; status post adjuvant chemotherapy with Taxotere and Cytoxan started 03/03/2015 completed 05/04/2015, started anastrozole 06/07/2015  Anastrozole toxicities: No major side effects anastrozole. 1. Minimal hot flashes. 2. Denies any myalgias.  Survivorship: Patient goes to the [employer] and does water exercises in [place]. She stays very active and busy with her work.  Cataract surgery complication in the left eye: Patient apparently had dislocation of the lens into the vitreous and she had to have a vitrectomy and she is still waiting to see how it improves and has ophthalmology follow-ups. She is unable to open her left eye.  Breast Cancer Surveillance: 1. Breast exam 12/26/2016: Benign 2. Mammograms June 2017   Low-dose CT scan performed by pulmonary 12/13/2016: Negative, emphysema  Return to clinic in 1 year for follow-up

## 2017-02-27 ENCOUNTER — Other Ambulatory Visit: Payer: Self-pay | Admitting: Cardiovascular Disease

## 2017-02-27 NOTE — Telephone Encounter (Signed)
Rx request sent to pharmacy.  

## 2017-03-02 LAB — PULMONARY FUNCTION TEST
FEF 25-75 Post: 1.56 L/sec
FEF 25-75 Pre: 1.25 L/sec
FEF2575-%Change-Post: 25 %
FEF2575-%PRED-PRE: 52 %
FEF2575-%Pred-Post: 66 %
FEV1-%Change-Post: 4 %
FEV1-%PRED-PRE: 63 %
FEV1-%Pred-Post: 66 %
FEV1-Post: 1.91 L
FEV1-Pre: 1.83 L
FEV1FVC-%CHANGE-POST: 7 %
FEV1FVC-%PRED-PRE: 95 %
FEV6-%Change-Post: -2 %
FEV6-%Pred-Post: 66 %
FEV6-%Pred-Pre: 68 %
FEV6-Post: 2.42 L
FEV6-Pre: 2.47 L
FEV6FVC-%Change-Post: 0 %
FEV6FVC-%PRED-POST: 104 %
FEV6FVC-%Pred-Pre: 103 %
FVC-%Change-Post: -2 %
FVC-%PRED-PRE: 66 %
FVC-%Pred-Post: 64 %
FVC-POST: 2.42 L
FVC-PRE: 2.48 L
POST FEV1/FVC RATIO: 79 %
PRE FEV1/FVC RATIO: 74 %
Post FEV6/FVC ratio: 100 %
Pre FEV6/FVC Ratio: 99 %

## 2017-03-19 ENCOUNTER — Ambulatory Visit
Admission: RE | Admit: 2017-03-19 | Discharge: 2017-03-19 | Disposition: A | Discharge status: Home / Self Care | Payer: 59 | Source: Ambulatory Visit | Attending: Family Medicine | Admitting: Family Medicine

## 2017-03-19 ENCOUNTER — Other Ambulatory Visit: Payer: Self-pay | Admitting: Family Medicine

## 2017-03-19 DIAGNOSIS — M25512 Pain in left shoulder: Secondary | ICD-10-CM

## 2017-03-22 ENCOUNTER — Other Ambulatory Visit: Payer: Self-pay | Admitting: Cardiovascular Disease

## 2017-04-03 ENCOUNTER — Encounter: Payer: Self-pay | Admitting: Cardiovascular Disease

## 2017-04-03 ENCOUNTER — Ambulatory Visit (INDEPENDENT_AMBULATORY_CARE_PROVIDER_SITE_OTHER): Payer: 59 | Admitting: Cardiovascular Disease

## 2017-04-03 DIAGNOSIS — E785 Hyperlipidemia, unspecified: Secondary | ICD-10-CM

## 2017-04-03 DIAGNOSIS — I10 Essential (primary) hypertension: Secondary | ICD-10-CM | POA: Diagnosis not present

## 2017-04-03 DIAGNOSIS — I2119 ST elevation (STEMI) myocardial infarction involving other coronary artery of inferior wall: Secondary | ICD-10-CM

## 2017-04-03 DIAGNOSIS — G4733 Obstructive sleep apnea (adult) (pediatric): Secondary | ICD-10-CM | POA: Diagnosis not present

## 2017-04-03 NOTE — Assessment & Plan Note (Signed)
History of hypertension blood pressure measured 162/80. When measured her blood pressure runs in the 130/70 range. She is on metoprolol. Continue current meds at current dosing.

## 2017-04-03 NOTE — Assessment & Plan Note (Signed)
History of CAD status post inferior STEMI 01/17/14. With drug-eluting stenting to the mid dominant RCA for a 80 % hazy lesion. The remainder of her coronary anatomy is unremarkable and her EF was normal. She's had no recurrent chest pain and has remained on dual antiplatelet therapy.

## 2017-04-03 NOTE — Assessment & Plan Note (Signed)
History of hyperlipidemia on statin therapy followed by her PCP. 

## 2017-04-03 NOTE — Progress Notes (Signed)
04/03/2017 Mariah Kelley   1951-02-04  [id number]  Primary Physician Jonathon Jordan, MD Primary Cardiologist: [name] Garret Reddish, [place], [place]  HPI:  Mariah Kelley is a 27.year-old. female w/ PMHx s/f HTN, HLD, prior tobacco abuse, obesity and family h/o premature CAD I last saw her in the office 04/05/16. She was  admitted to [facility] today for STEMI 01/17/14. She was in her USOH until ~2AM this morning when she experienced sudden onset substernal chest pressure radiating to her neck and arms bilaterally w/ associated nausea and vomiting. The discomfort persisted through the morning. She took an ASA 81mg  ~0600 w/ minimal relief. When the discomfort intensified to a 7-8/10, she sought medical care.  She has no prior cardiac history. She reports smoking 1 PPD x 40 years, no current tobacco abuse. Mother with MI in her 62s, father with MI at 31, brothers w/ CAD. Previously on medication for high cholesterol. No current medications.  In the ED, EKG revealed NSR w/ 32mm ST elevations in II, III, aVF and V5, V6 meeting diagnostic criteria for an inferolateral STEMI. Code STEMI was called, and she was transported emergently to the cardiac cath lab. I placed a drug-eluting stent in her dominant mid RCA for a hazy 80% plaque. The remainder of her coronary anatomy was free of significant disease in her function was preserved. She was discharged in stable condition. Since that time she has seen [place] PA-C in the office in followup. She does deny chest pain or shortness of breath. She has lost 40 pounds as made lifestyle modifications.since I saw her back in the office a year ago she's remained clinically stable specifically denying chest pain or shortness of breath.  Shewas diagnosed with breast cancer underwent mastectomy one year ago and chemotherapy subsequent to that. She is currently cancer free. She exercises 4 times a week but unfortunately continues to  gain weight. She is also scheduled to have a left total shoulder replacement in the upcoming future.   Current Outpatient Prescriptions  Medication Sig Dispense Refill  . albuterol (PROVENTIL HFA;VENTOLIN HFA) 108 (90 Base) MCG/ACT inhaler Inhale 2 puffs into the lungs every 6 (six) hours as needed for wheezing or shortness of breath. 1 Inhaler 2  . anastrozole (ARIMIDEX) 1 MG tablet Take 1 tablet (1 mg total) by mouth daily. 90 tablet 3  . aspirin EC 81 MG tablet Take 81 mg by mouth daily.    [name] atorvastatin (LIPITOR) 80 MG tablet Take 1 tablet (80 mg total) by mouth daily at 6 PM. NEED OV. 30 tablet 10  . budesonide-formoterol (SYMBICORT) 160-4.5 MCG/ACT inhaler Inhale 2 puffs into the lungs daily as needed. (Patient taking differently: Inhale 2 puffs into the lungs 4 (four) times daily. ) 1 Inhaler 12  . cholecalciferol (VITAMIN D) 1000 UNITS tablet Take 2,000 Units by mouth daily.    . clopidogrel (PLAVIX) 75 MG tablet TAKE 1 TABLET (75 MG TOTAL) BY MOUTH DAILY. 30 tablet 0  . metoprolol (LOPRESSOR) 50 MG tablet Take 1 tablet (50 mg total) by mouth 2 (two) times daily. 60 tablet 10  . nitroGLYCERIN (NITROSTAT) 0.4 MG SL tablet PLACE 1 TABLET UNDER THE TONGUE EVERY 5 MINUTES AS NEEDED FOR CHEST PAIN. 25 tablet 1   No current facility-administered medications for this visit.     Allergies  Allergen Reactions  . Codeine     Nausea and headache  . Prednisone Other (See Comments)    "made  poison ivy worse", rebound effect when take prednisone    Social History   Social History  . Marital status: Single    Spouse name: N/A  . Number of children: N/A  . Years of education: N/A   Occupational History  . Social Worker    Social History Main Topics  . Smoking status: Former Smoker    Packs/day: 1.00    Years: 40.00    Types: Cigarettes    Quit date: 07/30/2009  . Smokeless tobacco: Former Systems developer     Comment: quit 5 yrs ago  . Alcohol use No  . Drug use: No  . Sexual activity: No    Other Topics Concern  . Not on file   Social History Narrative   Divorced, 2 children.      Review of Systems: General: negative for chills, fever, night sweats or weight changes.  Cardiovascular: negative for chest pain, dyspnea on exertion, edema, orthopnea, palpitations, paroxysmal nocturnal dyspnea or shortness of breath Dermatological: negative for rash Respiratory: negative for cough or wheezing Urologic: negative for hematuria Abdominal: negative for nausea, vomiting, diarrhea, bright red blood per rectum, melena, or hematemesis Neurologic: negative for visual changes, syncope, or dizziness All other systems reviewed and are otherwise negative except as noted above.    Blood pressure (!) 162/80, pulse 77, height 5\' 9"  (1.753 m), weight 277 lb 3.2 oz (125.7 kg).  General appearance: alert and no distress Neck: no adenopathy, no carotid bruit, no JVD, supple, symmetrical, trachea midline and thyroid not enlarged, symmetric, no tenderness/mass/nodules Lungs: clear to auscultation bilaterally Heart: regular rate and rhythm, S1, S2 normal, no murmur, click, rub or gallop Extremities: extremities normal, atraumatic, no cyanosis or edema  EKG sinus rhythm at 77 with septal Q waves and LVH voltage. I personally reviewed his EKG  ASSESSMENT AND PLAN:   ST elevation myocardial infarction (STEMI) of inferolateral wall History of CAD status post inferior STEMI 01/17/14. With drug-eluting stenting to the mid dominant RCA for a 80 % hazy lesion. The remainder of her coronary anatomy is unremarkable and her EF was normal. She's had no recurrent chest pain and has remained on dual antiplatelet therapy.  Dyslipidemia History of hyperlipidemia on statin therapy followed by her PCP  HTN (hypertension) History of hypertension blood pressure measured 162/80. When measured her blood pressure runs in the 130/70 range. She is on metoprolol. Continue current meds at current  dosing.  Obstructive sleep apnea Symptoms compatible with obstructive sleep apnea and consistent with her body habitus. We will order an outpatient sleep study for further evaluation.      [name] FACP,FACC,FAHA, [facility] 04/03/2017 9:23 AM

## 2017-04-03 NOTE — Assessment & Plan Note (Signed)
Symptoms compatible with obstructive sleep apnea and consistent with her body habitus. We will order an outpatient sleep study for further evaluation.

## 2017-04-03 NOTE — Patient Instructions (Addendum)
Medication Instructions: Your physician recommends that you continue on your current medications as directed. Please refer to the Current Medication list given to you today.  OK to stop Plavix 5 days prior to procedure and Aspirin 81 mg 7 days prior to procedure.   Labwork: I will request recent lab work from Dr. Stephanie Acre.   Testing:  Your physician has recommended that you have a sleep study. This test records several body functions during sleep, including: brain activity, eye movement, oxygen and carbon dioxide blood levels, heart rate and rhythm, breathing rate and rhythm, the flow of air through your mouth and nose, snoring, body muscle movements, and chest and belly movement.  Follow-Up: Your physician wants you to follow-up in: 1 year with Dr. Gwenlyn Found. You will receive a reminder letter in the mail two months in advance. If you don't receive a letter, please call our office to schedule the follow-up appointment.   Any Other Special Instructions will be listed below:  You have been cleared for your Orthopedic procedure per Dr. Gwenlyn Found.   If you need a refill on your cardiac medications before your next appointment, please call your pharmacy.

## 2017-04-05 ENCOUNTER — Other Ambulatory Visit: Payer: Self-pay | Admitting: Orthopedic Surgery

## 2017-04-05 DIAGNOSIS — M19012 Primary osteoarthritis, left shoulder: Secondary | ICD-10-CM

## 2017-04-10 ENCOUNTER — Ambulatory Visit
Admission: RE | Admit: 2017-04-10 | Discharge: 2017-04-10 | Disposition: A | Discharge status: Home / Self Care | Payer: 59 | Source: Ambulatory Visit | Attending: Orthopedic Surgery | Admitting: Orthopedic Surgery

## 2017-04-10 DIAGNOSIS — M19012 Primary osteoarthritis, left shoulder: Secondary | ICD-10-CM

## 2017-04-10 NOTE — Pre-Procedure Instructions (Signed)
Mariah Kelley  04/10/2017      [facility] - [place], [place] - [place] [address] [place] [address] Phone: [phone] Fax: [phone]  [facility] [place], [place] - [place] [address] [place] [address] Phone: [phone] Fax: [phone]    Your procedure is scheduled on Thurs. June 14  Report to [facility] Admitting at 5:30 A.M.  Call this number if you have problems the morning of surgery:  [phone]   Remember:  Do not eat food or drink liquids after midnight on Wed. June 13   Take these medicines the morning of surgery with A SIP OF WATER : albuterol if needed-bring to hospital, anastrozole(arimidex), symbicort-bring to hospital,metoprolol (lopressor),nitroglycerine if needed           Stop plavix per Dr. Tamera Punt   Do not wear jewelry, make-up or nail polish.  Do not wear lotions, powders, or perfumes, or deoderant.  Do not shave 48 hours prior to surgery.  Men may shave face and neck.  Do not bring valuables to the hospital.  [facility] is not responsible for any belongings or valuables.  Contacts, dentures or bridgework may not be worn into surgery.  Leave your suitcase in the car.  After surgery it may be brought to your room.  For patients admitted to the hospital, discharge time will be determined by your treatment team.  Patients discharged the day of surgery will not be allowed to drive home.    Special instructions:   [facility]- Preparing For Surgery  Before surgery, you can play an important role. Because skin is not sterile, your skin needs to be as free of germs as possible. You can reduce the number of germs on your skin by washing with CHG (chlorahexidine gluconate) Soap before surgery.  CHG is an antiseptic cleaner which kills germs and bonds with the skin to continue killing germs even after washing.  Please do not use if you have an allergy to  CHG or antibacterial soaps. If your skin becomes reddened/irritated stop using the CHG.  Do not shave (including legs and underarms) for at least 48 hours prior to first CHG shower. It is OK to shave your face.  Please follow these instructions carefully.   1. Shower the NIGHT BEFORE SURGERY and the MORNING OF SURGERY with CHG.   2. If you chose to wash your hair, wash your hair first as usual with your normal shampoo.  3. After you shampoo, rinse your hair and body thoroughly to remove the shampoo.  4. Use CHG as you would any other liquid soap. You can apply CHG directly to the skin and wash gently with a scrungie or a clean washcloth.   5. Apply the CHG Soap to your body ONLY FROM THE NECK DOWN.  Do not use on open wounds or open sores. Avoid contact with your eyes, ears, mouth and genitals (private parts). Wash genitals (private parts) with your normal soap.  6. Wash thoroughly, paying special attention to the area where your surgery will be performed.  7. Thoroughly rinse your body with warm water from the neck down.  8. DO NOT shower/wash with your normal soap after using and rinsing off the CHG Soap.  9. Pat yourself dry with a CLEAN TOWEL.   10. Wear CLEAN PAJAMAS   11. Place CLEAN SHEETS on your bed the night of your first shower and DO NOT SLEEP WITH  PETS.    Day of Surgery: Do not apply any deodorants/lotions. Please wear clean clothes to the hospital/surgery center.      Please read over the following fact sheets that you were given. Coughing and Deep Breathing, MRSA Information and Surgical Site Infection Prevention

## 2017-04-11 ENCOUNTER — Encounter (HOSPITAL_COMMUNITY)
Admission: RE | Admit: 2017-04-11 | Discharge: 2017-04-11 | Disposition: A | Discharge status: Home / Self Care | Payer: 59 | Source: Ambulatory Visit | Attending: Orthopedic Surgery | Admitting: Orthopedic Surgery

## 2017-04-11 ENCOUNTER — Ambulatory Visit (HOSPITAL_COMMUNITY)
Admission: RE | Admit: 2017-04-11 | Discharge: 2017-04-11 | Disposition: A | Discharge status: Home / Self Care | Payer: 59 | Source: Ambulatory Visit | Attending: Orthopedic Surgery | Admitting: Orthopedic Surgery

## 2017-04-11 ENCOUNTER — Encounter (HOSPITAL_COMMUNITY): Payer: Self-pay

## 2017-04-11 ENCOUNTER — Other Ambulatory Visit: Payer: Self-pay | Admitting: Cardiovascular Disease

## 2017-04-11 DIAGNOSIS — I700 Atherosclerosis of aorta: Secondary | ICD-10-CM | POA: Insufficient documentation

## 2017-04-11 DIAGNOSIS — Z01818 Encounter for other preprocedural examination: Secondary | ICD-10-CM | POA: Insufficient documentation

## 2017-04-11 DIAGNOSIS — Z01812 Encounter for preprocedural laboratory examination: Secondary | ICD-10-CM | POA: Insufficient documentation

## 2017-04-11 DIAGNOSIS — J449 Chronic obstructive pulmonary disease, unspecified: Secondary | ICD-10-CM | POA: Diagnosis not present

## 2017-04-11 DIAGNOSIS — G4733 Obstructive sleep apnea (adult) (pediatric): Secondary | ICD-10-CM

## 2017-04-11 DIAGNOSIS — M19012 Primary osteoarthritis, left shoulder: Secondary | ICD-10-CM | POA: Insufficient documentation

## 2017-04-11 LAB — COMPREHENSIVE METABOLIC PANEL
ALT: 19 U/L (ref 14–54)
AST: 20 U/L (ref 15–41)
Albumin: 3.8 g/dL (ref 3.5–5.0)
Alkaline Phosphatase: 97 U/L (ref 38–126)
Anion gap: 9 (ref 5–15)
BUN: 16 mg/dL (ref 6–20)
CHLORIDE: 104 mmol/L (ref 101–111)
CO2: 26 mmol/L (ref 22–32)
CREATININE: 0.87 mg/dL (ref 0.44–1.00)
Calcium: 9.6 mg/dL (ref 8.9–10.3)
Glucose, Bld: 176 mg/dL — ABNORMAL HIGH (ref 65–99)
POTASSIUM: 3.8 mmol/L (ref 3.5–5.1)
SODIUM: 139 mmol/L (ref 135–145)
Total Bilirubin: 0.8 mg/dL (ref 0.3–1.2)
Total Protein: 6.6 g/dL (ref 6.5–8.1)

## 2017-04-11 LAB — CBC WITH DIFFERENTIAL/PLATELET
BASOS ABS: 0 10*3/uL (ref 0.0–0.1)
Basophils Relative: 0 %
EOS ABS: 0.3 10*3/uL (ref 0.0–0.7)
EOS PCT: 4 %
HCT: 38.8 % (ref 36.0–46.0)
Hemoglobin: 12.4 g/dL (ref 12.0–15.0)
Lymphocytes Relative: 19 %
Lymphs Abs: 1.4 10*3/uL (ref 0.7–4.0)
MCH: 28.4 pg (ref 26.0–34.0)
MCHC: 32 g/dL (ref 30.0–36.0)
MCV: 88.8 fL (ref 78.0–100.0)
MONO ABS: 0.5 10*3/uL (ref 0.1–1.0)
Monocytes Relative: 6 %
Neutro Abs: 5.3 10*3/uL (ref 1.7–7.7)
Neutrophils Relative %: 71 %
PLATELETS: 284 10*3/uL (ref 150–400)
RBC: 4.37 MIL/uL (ref 3.87–5.11)
RDW: 13.4 % (ref 11.5–15.5)
WBC: 7.4 10*3/uL (ref 4.0–10.5)

## 2017-04-11 LAB — URINALYSIS, ROUTINE W REFLEX MICROSCOPIC
Bilirubin Urine: NEGATIVE
GLUCOSE, UA: 150 mg/dL — AB
Hgb urine dipstick: NEGATIVE
KETONES UR: NEGATIVE mg/dL
LEUKOCYTES UA: NEGATIVE
NITRITE: NEGATIVE
PROTEIN: NEGATIVE mg/dL
Specific Gravity, Urine: 1.023 (ref 1.005–1.030)
pH: 5 (ref 5.0–8.0)

## 2017-04-11 LAB — SURGICAL PCR SCREEN
MRSA, PCR: NEGATIVE
STAPHYLOCOCCUS AUREUS: NEGATIVE

## 2017-04-11 LAB — APTT: APTT: 24 s (ref 24–36)

## 2017-04-11 LAB — PROTIME-INR
INR: 0.95
PROTHROMBIN TIME: 12.7 s (ref 11.4–15.2)

## 2017-04-11 NOTE — Progress Notes (Signed)
Mariah Kelley            04/10/2017                          [facility] - [place], [place] - [place] [address]  [place] [address] Phone: [phone] Fax: [phone]  [facility] [place], [place] - [place] [address] [place] [address] Phone: [phone] Fax: [phone]              Your procedure is scheduled on Thurs. June 14            Report to [facility] Admitting at 5:30 A.M.            Call this number if you have problems the morning of surgery:            [phone]             Remember:            Do not eat food or drink liquids after midnight on Wed. June 13             Take these medicines the morning of surgery with A SIP OF WATER : albuterol if needed-bring to hospital, anastrozole(arimidex), symbicort-bring to hospital,metoprolol (lopressor),nitroglycerine if needed           Stop plavix per Dr. Tamera Punt             Do not wear jewelry, make-up or nail polish.            Do not wear lotions, powders, or perfumes, or deoderant.            Do not shave 48 hours prior to surgery.  Men may shave face and neck.            Do not bring valuables to the hospital.            [facility] is not responsible for any belongings or valuables.  Contacts, dentures or bridgework may not be worn into surgery.  Leave your suitcase in the car.  After surgery it may be brought to your room.  For patients admitted to the hospital, discharge time will be determined by your treatment team.  Patients discharged the day of surgery will not be allowed to drive home.    Special instructions:      How to Manage Your Diabetes Before and After Surgery  Why is it important to control my blood sugar before and after surgery? . Improving blood sugar levels before and after surgery helps healing and can limit problems. . A way of improving blood sugar control is  eating a healthy diet by: o  Eating less sugar and carbohydrates o  Increasing activity/exercise o  Talking with your doctor about reaching your blood sugar goals . High blood sugars (greater than 180 mg/dL) can raise your risk of infections and slow your recovery, so you will need to focus on controlling your diabetes during the weeks before surgery. . Make sure that the doctor who takes care of your diabetes knows about your planned surgery including the date and location.  How do I manage my blood sugar before surgery? . Check your blood sugar at least 4 times a day, starting 2 days before surgery, to make sure that the level is not too high or low. o Check your blood sugar the morning of your surgery  when you wake up and every 2 hours until you get to the Short Stay unit. . If your blood sugar is less than 70 mg/dL, you will need to treat for low blood sugar: o Do not take insulin. o Treat a low blood sugar (less than 70 mg/dL) with  cup of clear juice (cranberry or apple), 4 glucose tablets, OR glucose gel. o Recheck blood sugar in 15 minutes after treatment (to make sure it is greater than 70 mg/dL). If your blood sugar is not greater than 70 mg/dL on recheck, call [phone] for further instructions. . Report your blood sugar to the short stay nurse when you get to Short Stay.  . If you are admitted to the hospital after surgery: o Your blood sugar will be checked by the staff and you will probably be given insulin after surgery (instead of oral diabetes medicines) to make sure you have good blood sugar levels. o The goal for blood sugar control after surgery is 80-180 mg/dL.   Reviewed and Endorsed by [facility] Patient Education Committee, August 2015  [facility]- Preparing For Surgery  Before surgery, you can play an important role. Because skin is not sterile, your skin needs to be as free of germs as possible. You can reduce the number of germs on your skin by washing with  CHG (chlorahexidine gluconate) Soap before surgery.  CHG is an antiseptic cleaner which kills germs and bonds with the skin to continue killing germs even after washing.  Please do not use if you have an allergy to CHG or antibacterial soaps. If your skin becomes reddened/irritated stop using the CHG.  Do not shave (including legs and underarms) for at least 48 hours prior to first CHG shower. It is OK to shave your face.  Please follow these instructions carefully.                                                                                                                     1. Shower the NIGHT BEFORE SURGERY and the MORNING OF SURGERY with CHG.   2. If you chose to wash your hair, wash your hair first as usual with your normal shampoo.  3. After you shampoo, rinse your hair and body thoroughly to remove the shampoo.  4. Use CHG as you would any other liquid soap. You can apply CHG directly to the skin and wash gently with a scrungie or a clean washcloth.   5. Apply the CHG Soap to your body ONLY FROM THE NECK DOWN.  Do not use on open wounds or open sores. Avoid contact with your eyes, ears, mouth and genitals (private parts). Wash genitals (private parts) with your normal soap.  6. Wash thoroughly, paying special attention to the area where your surgery will be performed.  7. Thoroughly rinse your body with warm water from the neck down.  8. DO NOT shower/wash with your normal soap after using and rinsing off the CHG Soap.  9. Pat yourself dry  with a CLEAN TOWEL.   10. Wear CLEAN PAJAMAS   11. Place CLEAN SHEETS on your bed the night of your first shower and DO NOT SLEEP WITH PETS.    Day of Surgery: Do not apply any deodorants/lotions. Please wear clean clothes to the hospital/surgery center.      Please read over the following fact sheets that you were given. Coughing and Deep Breathing, MRSA Information and Surgical Site Infection Prevention

## 2017-04-11 NOTE — Progress Notes (Signed)
PCP: Jonathon Jordan, MD  Cardiologist: Dr. Gwenlyn Found  EKG: 04/03/17  Stress test:pt denies ever  ECHO: pt denies ever  Cardiac Cath: 01/17/2014  Chest x-ray: pt denies past year, done today

## 2017-04-11 NOTE — Progress Notes (Signed)
PCP:  Cardiologist:  EKG:  Stress test:  ECHO:  Cardiac Cath:  Chest x-ray 

## 2017-04-12 LAB — HEMOGLOBIN A1C
HEMOGLOBIN A1C: 6.6 % — AB (ref 4.8–5.6)
Mean Plasma Glucose: 143 mg/dL

## 2017-04-18 MED ORDER — DEXTROSE 5 % IV SOLN
3.0000 g | INTRAVENOUS | Status: AC
  Administered 2017-04-19: 3 g via INTRAVENOUS
  Filled 2017-04-18: qty 3000

## 2017-04-19 ENCOUNTER — Inpatient Hospital Stay (HOSPITAL_COMMUNITY): Payer: Medicare Other | Admitting: Anesthesiology

## 2017-04-19 ENCOUNTER — Inpatient Hospital Stay (HOSPITAL_COMMUNITY): Payer: Medicare Other

## 2017-04-19 ENCOUNTER — Inpatient Hospital Stay (HOSPITAL_COMMUNITY)
Admission: RE | Admit: 2017-04-19 | Discharge: 2017-04-20 | DRG: 483 | Disposition: A | Discharge status: Home / Self Care | Payer: Medicare Other | Source: Ambulatory Visit | Attending: Orthopedic Surgery | Admitting: Orthopedic Surgery

## 2017-04-19 ENCOUNTER — Encounter (HOSPITAL_COMMUNITY): Payer: Self-pay | Admitting: *Deleted

## 2017-04-19 ENCOUNTER — Encounter (HOSPITAL_COMMUNITY)
Admission: RE | Disposition: A | Discharge status: Home / Self Care | Payer: Self-pay | Source: Ambulatory Visit | Attending: Orthopedic Surgery

## 2017-04-19 DIAGNOSIS — Z6841 Body Mass Index (BMI) 40.0 and over, adult: Secondary | ICD-10-CM | POA: Diagnosis not present

## 2017-04-19 DIAGNOSIS — I2510 Atherosclerotic heart disease of native coronary artery without angina pectoris: Secondary | ICD-10-CM | POA: Diagnosis not present

## 2017-04-19 DIAGNOSIS — M19012 Primary osteoarthritis, left shoulder: Principal | ICD-10-CM | POA: Diagnosis present

## 2017-04-19 DIAGNOSIS — Z885 Allergy status to narcotic agent status: Secondary | ICD-10-CM | POA: Diagnosis not present

## 2017-04-19 DIAGNOSIS — Z87891 Personal history of nicotine dependence: Secondary | ICD-10-CM | POA: Diagnosis not present

## 2017-04-19 DIAGNOSIS — Z7902 Long term (current) use of antithrombotics/antiplatelets: Secondary | ICD-10-CM | POA: Diagnosis not present

## 2017-04-19 DIAGNOSIS — I119 Hypertensive heart disease without heart failure: Secondary | ICD-10-CM | POA: Diagnosis not present

## 2017-04-19 DIAGNOSIS — G8918 Other acute postprocedural pain: Secondary | ICD-10-CM | POA: Diagnosis not present

## 2017-04-19 DIAGNOSIS — Z7951 Long term (current) use of inhaled steroids: Secondary | ICD-10-CM | POA: Diagnosis not present

## 2017-04-19 DIAGNOSIS — I10 Essential (primary) hypertension: Secondary | ICD-10-CM | POA: Diagnosis not present

## 2017-04-19 DIAGNOSIS — Z853 Personal history of malignant neoplasm of breast: Secondary | ICD-10-CM

## 2017-04-19 DIAGNOSIS — E785 Hyperlipidemia, unspecified: Secondary | ICD-10-CM | POA: Diagnosis present

## 2017-04-19 DIAGNOSIS — E119 Type 2 diabetes mellitus without complications: Secondary | ICD-10-CM | POA: Diagnosis not present

## 2017-04-19 DIAGNOSIS — Z96612 Presence of left artificial shoulder joint: Secondary | ICD-10-CM

## 2017-04-19 DIAGNOSIS — Z955 Presence of coronary angioplasty implant and graft: Secondary | ICD-10-CM | POA: Diagnosis not present

## 2017-04-19 DIAGNOSIS — Z79899 Other long term (current) drug therapy: Secondary | ICD-10-CM | POA: Diagnosis not present

## 2017-04-19 DIAGNOSIS — Z7982 Long term (current) use of aspirin: Secondary | ICD-10-CM | POA: Diagnosis not present

## 2017-04-19 DIAGNOSIS — Z888 Allergy status to other drugs, medicaments and biological substances status: Secondary | ICD-10-CM

## 2017-04-19 HISTORY — PX: TOTAL SHOULDER ARTHROPLASTY: SHX126

## 2017-04-19 LAB — GLUCOSE, CAPILLARY
Glucose-Capillary: 130 mg/dL — ABNORMAL HIGH (ref 65–99)
Glucose-Capillary: 130 mg/dL — ABNORMAL HIGH (ref 65–99)

## 2017-04-19 SURGERY — ARTHROPLASTY, SHOULDER, TOTAL
Anesthesia: General | Site: Shoulder | Laterality: Left

## 2017-04-19 MED ORDER — SODIUM CHLORIDE 0.9 % IR SOLN
Status: DC | PRN
Start: 2017-04-19 — End: 2017-04-19
  Administered 2017-04-19: 3000 mL

## 2017-04-19 MED ORDER — DOCUSATE SODIUM 100 MG PO CAPS
100.0000 mg | ORAL_CAPSULE | Freq: Two times a day (BID) | ORAL | Status: DC
  Administered 2017-04-19 – 2017-04-20 (×3): 100 mg via ORAL
  Filled 2017-04-19 (×3): qty 1

## 2017-04-19 MED ORDER — MORPHINE SULFATE (PF) 4 MG/ML IV SOLN: 1.0000 mg | INTRAVENOUS | Status: DC | PRN

## 2017-04-19 MED ORDER — POVIDONE-IODINE 7.5 % EX SOLN
Freq: Once | CUTANEOUS | Status: DC
  Filled 2017-04-19: qty 118

## 2017-04-19 MED ORDER — SUGAMMADEX SODIUM 500 MG/5ML IV SOLN
INTRAVENOUS | Status: DC | PRN
  Administered 2017-04-19: 300 mg via INTRAVENOUS

## 2017-04-19 MED ORDER — OXYCODONE HCL 5 MG PO TABS
ORAL_TABLET | ORAL | Status: AC
  Administered 2017-04-19: 10 mg via ORAL
  Filled 2017-04-19: qty 2

## 2017-04-19 MED ORDER — SODIUM CHLORIDE 0.9% FLUSH
INTRAVENOUS | Status: DC | PRN
  Administered 2017-04-19: 20 mL

## 2017-04-19 MED ORDER — ROCURONIUM BROMIDE 100 MG/10ML IV SOLN
INTRAVENOUS | Status: DC | PRN
  Administered 2017-04-19: 50 mg via INTRAVENOUS

## 2017-04-19 MED ORDER — PROPOFOL 10 MG/ML IV BOLUS
INTRAVENOUS | Status: DC | PRN
  Administered 2017-04-19: 125 mg via INTRAVENOUS

## 2017-04-19 MED ORDER — ACETAMINOPHEN 325 MG PO TABS
650.0000 mg | ORAL_TABLET | Freq: Four times a day (QID) | ORAL | Status: DC | PRN
  Administered 2017-04-19: 650 mg via ORAL

## 2017-04-19 MED ORDER — PHENYLEPHRINE HCL 10 MG/ML IJ SOLN
INTRAVENOUS | Status: DC | PRN
  Administered 2017-04-19: 50 ug/min via INTRAVENOUS

## 2017-04-19 MED ORDER — OXYCODONE HCL 5 MG PO TABS: 5.0000 mg | ORAL_TABLET | Freq: Once | ORAL | Status: DC | PRN

## 2017-04-19 MED ORDER — ATORVASTATIN CALCIUM 80 MG PO TABS
80.0000 mg | ORAL_TABLET | Freq: Every day | ORAL | Status: DC
  Administered 2017-04-19: 80 mg via ORAL
  Filled 2017-04-19: qty 1

## 2017-04-19 MED ORDER — ACETAMINOPHEN 325 MG PO TABS
ORAL_TABLET | ORAL | Status: AC
  Administered 2017-04-19: 650 mg via ORAL
  Filled 2017-04-19: qty 2

## 2017-04-19 MED ORDER — ONDANSETRON HCL 4 MG/2ML IJ SOLN
INTRAMUSCULAR | Status: DC | PRN
Start: 2017-04-19 — End: 2017-04-19
  Administered 2017-04-19: 4 mg via INTRAVENOUS

## 2017-04-19 MED ORDER — ASPIRIN EC 325 MG PO TBEC
325.0000 mg | DELAYED_RELEASE_TABLET | Freq: Two times a day (BID) | ORAL | Status: AC
  Administered 2017-04-19 – 2017-04-20 (×2): 325 mg via ORAL
  Filled 2017-04-19 (×2): qty 1

## 2017-04-19 MED ORDER — ACETAMINOPHEN 500 MG PO TABS
1000.0000 mg | ORAL_TABLET | Freq: Four times a day (QID) | ORAL | Status: DC
  Administered 2017-04-19 – 2017-04-20 (×3): 1000 mg via ORAL
  Filled 2017-04-19 (×4): qty 2

## 2017-04-19 MED ORDER — DIPHENHYDRAMINE HCL 12.5 MG/5ML PO ELIX: 12.5000 mg | ORAL_SOLUTION | ORAL | Status: DC | PRN

## 2017-04-19 MED ORDER — CLOPIDOGREL BISULFATE 75 MG PO TABS
75.0000 mg | ORAL_TABLET | Freq: Every day | ORAL | Status: DC
  Administered 2017-04-20: 75 mg via ORAL
  Filled 2017-04-19: qty 1

## 2017-04-19 MED ORDER — PROPOFOL 10 MG/ML IV BOLUS
INTRAVENOUS | Status: AC
  Filled 2017-04-19: qty 20

## 2017-04-19 MED ORDER — ONDANSETRON HCL 4 MG/2ML IJ SOLN: 4.0000 mg | Freq: Four times a day (QID) | INTRAMUSCULAR | Status: DC | PRN

## 2017-04-19 MED ORDER — EPHEDRINE SULFATE 50 MG/ML IJ SOLN
INTRAMUSCULAR | Status: DC | PRN
  Administered 2017-04-19: 10 mg via INTRAVENOUS
  Administered 2017-04-19 (×2): 5 mg via INTRAVENOUS

## 2017-04-19 MED ORDER — PHENYLEPHRINE HCL 10 MG/ML IJ SOLN
INTRAMUSCULAR | Status: DC | PRN
  Administered 2017-04-19 (×2): 80 ug via INTRAVENOUS

## 2017-04-19 MED ORDER — PHENOL 1.4 % MT LIQD: 1.0000 | OROMUCOSAL | Status: DC | PRN

## 2017-04-19 MED ORDER — ONDANSETRON HCL 4 MG/2ML IJ SOLN
4.0000 mg | Freq: Four times a day (QID) | INTRAMUSCULAR | Status: DC | PRN
  Administered 2017-04-20: 4 mg via INTRAVENOUS
  Filled 2017-04-19: qty 2

## 2017-04-19 MED ORDER — ACETAMINOPHEN 650 MG RE SUPP: 650.0000 mg | Freq: Four times a day (QID) | RECTAL | Status: DC | PRN

## 2017-04-19 MED ORDER — METOCLOPRAMIDE HCL 5 MG PO TABS: 5.0000 mg | ORAL_TABLET | Freq: Three times a day (TID) | ORAL | Status: DC | PRN

## 2017-04-19 MED ORDER — BUPIVACAINE-EPINEPHRINE (PF) 0.5% -1:200000 IJ SOLN
INTRAMUSCULAR | Status: DC | PRN
  Administered 2017-04-19: 30 mL via PERINEURAL

## 2017-04-19 MED ORDER — BUPIVACAINE LIPOSOME 1.3 % IJ SUSP
20.0000 mL | INTRAMUSCULAR | Status: AC
  Administered 2017-04-19: 20 mL
  Filled 2017-04-19: qty 20

## 2017-04-19 MED ORDER — HEMOSTATIC AGENTS (NO CHARGE) OPTIME
TOPICAL | Status: DC | PRN
  Administered 2017-04-19: 1 via TOPICAL

## 2017-04-19 MED ORDER — METOPROLOL TARTRATE 50 MG PO TABS
50.0000 mg | ORAL_TABLET | Freq: Two times a day (BID) | ORAL | Status: DC
  Administered 2017-04-19 – 2017-04-20 (×2): 50 mg via ORAL
  Filled 2017-04-19 (×2): qty 1

## 2017-04-19 MED ORDER — OXYCODONE HCL 5 MG/5ML PO SOLN: 5.0000 mg | Freq: Once | ORAL | Status: DC | PRN

## 2017-04-19 MED ORDER — MOMETASONE FURO-FORMOTEROL FUM 200-5 MCG/ACT IN AERO
2.0000 | INHALATION_SPRAY | Freq: Two times a day (BID) | RESPIRATORY_TRACT | Status: DC
  Filled 2017-04-19: qty 8.8

## 2017-04-19 MED ORDER — MORPHINE SULFATE (PF) 2 MG/ML IV SOLN: 1.0000 mg | INTRAVENOUS | Status: DC | PRN

## 2017-04-19 MED ORDER — PHENYLEPHRINE 40 MCG/ML (10ML) SYRINGE FOR IV PUSH (FOR BLOOD PRESSURE SUPPORT)
PREFILLED_SYRINGE | INTRAVENOUS | Status: AC
  Filled 2017-04-19: qty 10

## 2017-04-19 MED ORDER — ONDANSETRON HCL 4 MG PO TABS: 4.0000 mg | ORAL_TABLET | Freq: Four times a day (QID) | ORAL | Status: DC | PRN

## 2017-04-19 MED ORDER — ZOLPIDEM TARTRATE 5 MG PO TABS: 5.0000 mg | ORAL_TABLET | Freq: Every evening | ORAL | Status: DC | PRN

## 2017-04-19 MED ORDER — ANASTROZOLE 1 MG PO TABS
1.0000 mg | ORAL_TABLET | Freq: Every day | ORAL | Status: DC
  Administered 2017-04-20: 1 mg via ORAL
  Filled 2017-04-19 (×2): qty 1

## 2017-04-19 MED ORDER — ALUM & MAG HYDROXIDE-SIMETH 200-200-20 MG/5ML PO SUSP: 30.0000 mL | ORAL | Status: DC | PRN

## 2017-04-19 MED ORDER — GLYCOPYRROLATE 0.2 MG/ML IJ SOLN
INTRAMUSCULAR | Status: DC | PRN
  Administered 2017-04-19: 0.2 mg via INTRAVENOUS

## 2017-04-19 MED ORDER — BISACODYL 5 MG PO TBEC
5.0000 mg | DELAYED_RELEASE_TABLET | Freq: Every day | ORAL | Status: DC | PRN
  Administered 2017-04-19: 5 mg via ORAL
  Filled 2017-04-19: qty 1

## 2017-04-19 MED ORDER — TRANEXAMIC ACID 1000 MG/10ML IV SOLN
1000.0000 mg | INTRAVENOUS | Status: AC
  Administered 2017-04-19: 1000 mg via INTRAVENOUS
  Filled 2017-04-19: qty 10

## 2017-04-19 MED ORDER — FENTANYL CITRATE (PF) 100 MCG/2ML IJ SOLN
INTRAMUSCULAR | Status: AC
  Filled 2017-04-19: qty 4

## 2017-04-19 MED ORDER — FENTANYL CITRATE (PF) 100 MCG/2ML IJ SOLN: 25.0000 ug | INTRAMUSCULAR | Status: DC | PRN

## 2017-04-19 MED ORDER — CEFAZOLIN SODIUM-DEXTROSE 2-4 GM/100ML-% IV SOLN
2.0000 g | Freq: Four times a day (QID) | INTRAVENOUS | Status: AC
  Administered 2017-04-19 – 2017-04-20 (×3): 2 g via INTRAVENOUS
  Filled 2017-04-19 (×4): qty 100

## 2017-04-19 MED ORDER — POLYETHYLENE GLYCOL 3350 17 G PO PACK: 17.0000 g | PACK | Freq: Every day | ORAL | Status: DC | PRN

## 2017-04-19 MED ORDER — METOCLOPRAMIDE HCL 5 MG/ML IJ SOLN
5.0000 mg | Freq: Three times a day (TID) | INTRAMUSCULAR | Status: DC | PRN
  Administered 2017-04-20: 10 mg via INTRAVENOUS
  Filled 2017-04-19: qty 2

## 2017-04-19 MED ORDER — LIDOCAINE HCL (CARDIAC) 20 MG/ML IV SOLN
INTRAVENOUS | Status: DC | PRN
  Administered 2017-04-19: 50 mg via INTRAVENOUS

## 2017-04-19 MED ORDER — ALBUTEROL SULFATE (2.5 MG/3ML) 0.083% IN NEBU: 3.0000 mL | INHALATION_SOLUTION | Freq: Four times a day (QID) | RESPIRATORY_TRACT | Status: DC | PRN

## 2017-04-19 MED ORDER — OXYCODONE HCL 5 MG PO TABS
5.0000 mg | ORAL_TABLET | ORAL | Status: DC | PRN
  Administered 2017-04-19 (×2): 10 mg via ORAL
  Administered 2017-04-20: 5 mg via ORAL
  Filled 2017-04-19: qty 1
  Filled 2017-04-19: qty 2

## 2017-04-19 MED ORDER — MIDAZOLAM HCL 5 MG/5ML IJ SOLN
INTRAMUSCULAR | Status: DC | PRN
  Administered 2017-04-19: 2 mg via INTRAVENOUS

## 2017-04-19 MED ORDER — SODIUM CHLORIDE 0.9 % IV SOLN
INTRAVENOUS | Status: DC
  Administered 2017-04-19: 12:00:00 via INTRAVENOUS

## 2017-04-19 MED ORDER — LACTATED RINGERS IV SOLN
INTRAVENOUS | Status: DC | PRN
  Administered 2017-04-19 (×2): via INTRAVENOUS

## 2017-04-19 MED ORDER — 0.9 % SODIUM CHLORIDE (POUR BTL) OPTIME
TOPICAL | Status: DC | PRN
  Administered 2017-04-19: 1000 mL

## 2017-04-19 MED ORDER — MENTHOL 3 MG MT LOZG: 1.0000 | LOZENGE | OROMUCOSAL | Status: DC | PRN

## 2017-04-19 MED ORDER — FLEET ENEMA 7-19 GM/118ML RE ENEM: 1.0000 | ENEMA | Freq: Once | RECTAL | Status: DC | PRN

## 2017-04-19 MED ORDER — FENTANYL CITRATE (PF) 100 MCG/2ML IJ SOLN
INTRAMUSCULAR | Status: DC | PRN
  Administered 2017-04-19: 50 ug via INTRAVENOUS

## 2017-04-19 MED ORDER — MIDAZOLAM HCL 2 MG/2ML IJ SOLN
INTRAMUSCULAR | Status: AC
  Filled 2017-04-19: qty 2

## 2017-04-19 SURGICAL SUPPLY — 79 items
AID PSTN UNV HD RSTRNT DISP (MISCELLANEOUS) ×1
BIT DRILL 5/64X5 DISP (BIT) IMPLANT
BLADE SAW SAG 73X25 THK (BLADE) ×2
BLADE SAW SGTL 73X25 THK (BLADE) ×1 IMPLANT
BLADE SURG 10 STRL SS (BLADE) ×2 IMPLANT
BLADE SURG 15 STRL LF DISP TIS (BLADE) ×1 IMPLANT
BLADE SURG 15 STRL SS (BLADE) ×3
CAP SHOULDER TOTAL 2 ×2 IMPLANT
CEMENT BONE DEPUY (Cement) ×2 IMPLANT
CHLORAPREP W/TINT 26ML (MISCELLANEOUS) ×3 IMPLANT
CLOSURE STERI-STRIP 1/2X4 (GAUZE/BANDAGES/DRESSINGS) ×1
CLOSURE WOUND 1/2 X4 (GAUZE/BANDAGES/DRESSINGS) ×1
CLSR STERI-STRIP ANTIMIC 1/2X4 (GAUZE/BANDAGES/DRESSINGS) ×1 IMPLANT
COVER SURGICAL LIGHT HANDLE (MISCELLANEOUS) ×3 IMPLANT
DRAPE HALF SHEET 40X57 (DRAPES) ×2 IMPLANT
DRAPE INCISE IOBAN 66X45 STRL (DRAPES) ×3 IMPLANT
DRAPE ORTHO SPLIT 77X108 STRL (DRAPES) ×6
DRAPE SURG 17X23 STRL (DRAPES) ×3 IMPLANT
DRAPE SURG ORHT 6 SPLT 77X108 (DRAPES) ×2 IMPLANT
DRAPE U-SHAPE 47X51 STRL (DRAPES) ×3 IMPLANT
DRSG AQUACEL AG ADV 3.5X10 (GAUZE/BANDAGES/DRESSINGS) ×2 IMPLANT
ELECT BLADE 4.0 EZ CLEAN MEGAD (MISCELLANEOUS)
ELECT REM PT RETURN 9FT ADLT (ELECTROSURGICAL) ×3
ELECTRODE BLDE 4.0 EZ CLN MEGD (MISCELLANEOUS) IMPLANT
ELECTRODE REM PT RTRN 9FT ADLT (ELECTROSURGICAL) ×1 IMPLANT
EVACUATOR 1/8 PVC DRAIN (DRAIN) IMPLANT
GLOVE BIO SURGEON STRL SZ7 (GLOVE) ×3 IMPLANT
GLOVE BIO SURGEON STRL SZ7.5 (GLOVE) ×3 IMPLANT
GLOVE BIOGEL PI IND STRL 7.0 (GLOVE) ×1 IMPLANT
GLOVE BIOGEL PI IND STRL 8 (GLOVE) ×1 IMPLANT
GLOVE BIOGEL PI INDICATOR 7.0 (GLOVE) ×2
GLOVE BIOGEL PI INDICATOR 8 (GLOVE) ×2
GOWN STRL REUS W/ TWL LRG LVL3 (GOWN DISPOSABLE) ×1 IMPLANT
GOWN STRL REUS W/ TWL XL LVL3 (GOWN DISPOSABLE) ×1 IMPLANT
GOWN STRL REUS W/TWL LRG LVL3 (GOWN DISPOSABLE) ×3
GOWN STRL REUS W/TWL XL LVL3 (GOWN DISPOSABLE) ×3
GUIDEWIRE GLENOID 2.5X220 (WIRE) ×2 IMPLANT
HANDPIECE INTERPULSE COAX TIP (DISPOSABLE) ×3
HEMOSTAT SURGICEL 2X14 (HEMOSTASIS) ×3 IMPLANT
HOOD PEEL AWAY FLYTE STAYCOOL (MISCELLANEOUS) ×6 IMPLANT
KIT BASIN OR (CUSTOM PROCEDURE TRAY) ×3 IMPLANT
KIT ROOM TURNOVER OR (KITS) ×3 IMPLANT
MANIFOLD NEPTUNE II (INSTRUMENTS) ×3 IMPLANT
NDL MAYO TROCAR (NEEDLE) ×1 IMPLANT
NDL SPNL 18GX3.5 QUINCKE PK (NEEDLE) ×2 IMPLANT
NEEDLE HYPO 25GX1X1/2 BEV (NEEDLE) IMPLANT
NEEDLE MAYO TROCAR (NEEDLE) ×3 IMPLANT
NEEDLE SPNL 18GX3.5 QUINCKE PK (NEEDLE) ×6 IMPLANT
NEEDLE SPNL 22GX3.5 QUINCKE BK (NEEDLE) ×3 IMPLANT
NS IRRIG 1000ML POUR BTL (IV SOLUTION) ×3 IMPLANT
PACK SHOULDER (CUSTOM PROCEDURE TRAY) ×3 IMPLANT
PAD ARMBOARD 7.5X6 YLW CONV (MISCELLANEOUS) ×6 IMPLANT
RESTRAINT HEAD UNIVERSAL NS (MISCELLANEOUS) ×3 IMPLANT
RETRIEVER SUT HEWSON (MISCELLANEOUS) ×3 IMPLANT
SET HNDPC FAN SPRY TIP SCT (DISPOSABLE) ×1 IMPLANT
SLING ARM FOAM STRAP LRG (SOFTGOODS) ×3 IMPLANT
SLING ARM FOAM STRAP MED (SOFTGOODS) IMPLANT
SLING ARM FOAM STRAP XLG (SOFTGOODS) ×3 IMPLANT
SMARTMIX MINI TOWER (MISCELLANEOUS) ×3
SPONGE LAP 18X18 X RAY DECT (DISPOSABLE) ×3 IMPLANT
SPONGE LAP 4X18 X RAY DECT (DISPOSABLE) IMPLANT
STRIP CLOSURE SKIN 1/2X4 (GAUZE/BANDAGES/DRESSINGS) ×2 IMPLANT
SUCTION FRAZIER HANDLE 10FR (MISCELLANEOUS) ×2
SUCTION TUBE FRAZIER 10FR DISP (MISCELLANEOUS) ×1 IMPLANT
SUPPORT WRAP ARM LG (MISCELLANEOUS) ×3 IMPLANT
SUT BONE WAX W31G (SUTURE) ×3 IMPLANT
SUT ETHIBOND NAB CT1 #1 30IN (SUTURE) ×9 IMPLANT
SUT MNCRL AB 4-0 PS2 18 (SUTURE) ×3 IMPLANT
SUT SILK 2 0 TIES 17X18 (SUTURE)
SUT SILK 2-0 18XBRD TIE BLK (SUTURE) IMPLANT
SUT VIC AB 2-0 CT1 27 (SUTURE) ×3
SUT VIC AB 2-0 CT1 TAPERPNT 27 (SUTURE) ×1 IMPLANT
SYR 30ML LL (SYRINGE) ×6 IMPLANT
SYR CONTROL 10ML LL (SYRINGE) IMPLANT
TAPE LABRALWHITE 1.5X36 (TAPE) ×3 IMPLANT
TAPE SUT LABRALTAP WHT/BLK (SUTURE) ×3 IMPLANT
TOWEL OR 17X24 6PK STRL BLUE (TOWEL DISPOSABLE) ×3 IMPLANT
TOWEL OR 17X26 10 PK STRL BLUE (TOWEL DISPOSABLE) ×3 IMPLANT
TOWER SMARTMIX MINI (MISCELLANEOUS) ×1 IMPLANT

## 2017-04-19 NOTE — Discharge Instructions (Signed)

## 2017-04-19 NOTE — Anesthesia Postprocedure Evaluation (Signed)
Anesthesia Post Note  Patient: Mariah Kelley  Procedure(s) Performed: Procedure(s) (LRB): TOTAL SHOULDER ARTHROPLASTY (Left)     Patient location during evaluation: PACU Anesthesia Type: General Level of consciousness: awake and alert and patient cooperative Pain management: pain level controlled Vital Signs Assessment: post-procedure vital signs reviewed and stable Respiratory status: spontaneous breathing and respiratory function stable Cardiovascular status: stable Anesthetic complications: no    Last Vitals:  Vitals:   04/19/17 1000 04/19/17 1015  BP: (!) 162/62 (!) 161/66  Pulse: 87 85  Resp: 16 (!) 23  Temp:      Last Pain:  Vitals:   04/19/17 0940  TempSrc:   PainSc: 0-No pain                 Warrick Llera S

## 2017-04-19 NOTE — H&P (Signed)
Mariah Kelley is an 66 y.o. female.   Chief Complaint: L shoulder pain and dysfunction HPI: Endstage L shoulder arthritis with significant pain and dysfunction, failed conservative measures.  Pain interferes with sleep and quality of life.   Past Medical History:  Diagnosis Date  . Arthritis   . Breast cancer of upper-outer quadrant of right female breast ([place]) 12/23/2014  . Bruises easily   . CAD S/P percutaneous coronary angioplasty 01/17/2014   takes Brilinta and Plavix daily  . Cataracts, bilateral   . Diabetes mellitus without complication ([place])   . History of bronchitis 7-70yrs ago  . History of tobacco abuse    40 pack-years  . Hyperlipidemia    takes Lipitor daily  . Hypertension    takes Metoprolol daily  . Joint pain   . Obesity   . ST elevation myocardial infarction (STEMI) of inferolateral wall ([place]) 01/17/2014    Past Surgical History:  Procedure Laterality Date  . ABDOMINAL HYSTERECTOMY    . APPENDECTOMY    . BREAST REDUCTION SURGERY Left 01/27/2015  . BREAST REDUCTION SURGERY Left 01/27/2015   Procedure: MAMMARY REDUCTION  LEFT (BREAST);  Surgeon: Irene Limbo, MD;  Location: [facility];  Service: Plastics;  Laterality: Left;  . CATARACT EXTRACTION W/PHACO Right 04/11/2016   Procedure: CATARACT EXTRACTION PHACO AND INTRAOCULAR LENS PLACEMENT (IOC);  Surgeon: Rutherford Guys, MD;  [facility];  Service: Ophthalmology;  Laterality: Right;  CDE: 14.15  . CATARACT EXTRACTION W/PHACO Left 05/02/2016   Procedure: CATARACT EXTRACTION PHACO AND INTRAOCULAR LENS PLACEMENT (IOC);  Surgeon: Rutherford Guys, MD;  Location: [facility];  Service: Ophthalmology;  Laterality: Left;  CDE: 12.11  . CHOLECYSTECTOMY    . COLONOSCOPY    . LEFT HEART CATH Bilateral 01/17/2014   Procedure: LEFT HEART CATH;  Surgeon: Lorretta Harp, MD;  Location: [facility] C[facility];  Service: Cardiovascular;  Laterality: Bilateral;  . MASTECTOMY W/ SENTINEL NODE BIOPSY Right 01/27/2015   dr Lucia Gaskins   .  MASTECTOMY W/ SENTINEL NODE BIOPSY Right 01/27/2015   Procedure: RIGHT MASTECTOMY WITH RIGHT AXILLARY  SENTINEL LYMPH NODE BIOPSY;  Surgeon: Alphonsa Overall, MD;  Location: [facility];  Service: General;  Laterality: Right;  . PERCUTANEOUS CORONARY STENT INTERVENTION (PCI-S)    . PORTACATH PLACEMENT Left 03/23/2015   Procedure: INSERTION PORT-A-CATH ;  Surgeon: Alphonsa Overall, MD;  Location: [facility];  Service: General;  Laterality: Left;  . SPINAL FUSION    . TUBAL LIGATION      Family History  Problem Relation Age of Onset  . Heart attack Mother 41       passed at 22  . Heart attack Father 23       s/p CABG, multiple PCIs  . CAD Brother        Still living  . CAD Brother        Still living   Social History:  reports that she quit smoking about 7 years ago. Her smoking use included Cigarettes. She has a 40.00 pack-year smoking history. She has quit using smokeless tobacco. She reports that she does not drink alcohol or use drugs.  Allergies:  Allergies  Allergen Reactions  . Prednisone Other (See Comments)    "made poison ivy worse", rebound effect when take prednisone  . Codeine Nausea Only and Other (See Comments)    HEADACHE    Medications Prior to Admission  Medication Sig Dispense Refill  . albuterol (PROVENTIL HFA;VENTOLIN HFA) 108 (90 Base) MCG/ACT inhaler Inhale 2 puffs into  the lungs every 6 (six) hours as needed for wheezing or shortness of breath. 1 Inhaler 2  . anastrozole (ARIMIDEX) 1 MG tablet Take 1 tablet (1 mg total) by mouth daily. 90 tablet 3  . aspirin EC 81 MG tablet Take 81 mg by mouth daily.    [name] atorvastatin (LIPITOR) 80 MG tablet Take 1 tablet (80 mg total) by mouth daily at 6 PM. NEED OV. (Patient taking differently: Take 80 mg by mouth daily at 6 PM. ) 30 tablet 10  . budesonide-formoterol (SYMBICORT) 160-4.5 MCG/ACT inhaler Inhale 2 puffs into the lungs daily as needed. (Patient taking differently: Inhale 2 puffs into the lungs 2 (two) times daily. ) 1 Inhaler 12   . Cholecalciferol (VITAMIN D3) 2000 units TABS Take 2,000 Units by mouth daily.    . clopidogrel (PLAVIX) 75 MG tablet TAKE 1 TABLET (75 MG TOTAL) BY MOUTH DAILY. 30 tablet 0  . metoprolol (LOPRESSOR) 50 MG tablet Take 1 tablet (50 mg total) by mouth 2 (two) times daily. 60 tablet 10  . nitroGLYCERIN (NITROSTAT) 0.4 MG SL tablet PLACE 1 TABLET UNDER THE TONGUE EVERY 5 MINUTES AS NEEDED FOR CHEST PAIN. 25 tablet 1    Results for orders placed or performed during the hospital encounter of 04/19/17 (from the past 48 hour(s))  Glucose, capillary     Status: Abnormal   Collection Time: 04/19/17  6:11 AM  Result Value Ref Range   Glucose-Capillary 130 (H) 65 - 99 mg/dL   No results found.  Review of Systems  All other systems reviewed and are negative.   Blood pressure (!) 148/73, pulse 73, temperature 98.2 F (36.8 C), temperature source Oral, resp. rate 18, height 5\' 9"  (1.753 m), weight 125.6 kg (277 lb), SpO2 95 %. Physical Exam  Constitutional: She is oriented to person, place, and time. She appears well-developed and well-nourished.  HENT:  Head: Atraumatic.  Eyes: EOM are normal.  Cardiovascular: Intact distal pulses.   Respiratory: Effort normal.  Musculoskeletal:  L shoulder pain with limited ROM.  Neurological: She is alert and oriented to person, place, and time.  Skin: Skin is warm and dry.  Psychiatric: She has a normal mood and affect.     Assessment/Plan Endstage L shoulder arthritis with significant pain and dysfunction, failed conservative measures.  Pain interferes with sleep and quality of life. Plan L total shoulder replacement Risks / benefits of surgery discussed Consent on chart  NPO for OR Preop antibiotics   Nita Sells, MD 04/19/2017, 7:20 AM

## 2017-04-19 NOTE — Op Note (Signed)
Procedure(s): TOTAL SHOULDER ARTHROPLASTY Procedure Note  SAM WUNSCHEL female 66 y.o. 04/19/2017  Procedure(s) and Anesthesia Type:    * LEFT TOTAL SHOULDER ARTHROPLASTY - General      LEFT PROXIMAL LONG HEAD BICEPS TENODESIS  Surgeon(s) and Role:    Tania Ade, MD - Primary   Indications:  66 y.o. female  With endstage left shoulder arthritis. Pain and dysfunction interfered with quality of life and nonoperative treatment with activity modification, NSAIDS and injections failed.     Surgeon: Nita Sells   Assistants: Jeanmarie Hubert PA-C [facility] was present and scrubbed throughout the procedure and was essential in positioning, retraction, exposure, and closure)  Anesthesia: General endotracheal anesthesia with preoperative interscalene block given by the attending anesthesiologist    Procedure Detail   Findings: Tornier flex anatomic press-fit size 3 stem with a 48 high offset head, cemented size 81M Cortiloc glenoid.   A lesser tuberosity osteotomy was performed and repaired at the conclusion of the procedure.  Estimated Blood Loss:  200 mL         Drains: None   Blood Given: none          Specimens: none        Complications:  * No complications entered in OR log *         Disposition: PACU - hemodynamically stable.         Condition: stable    Procedure:   The patient was identified in the preoperative holding area where I personally marked the operative extremity after verifying with the patient and consent. She  was taken to the operating room where She was transferred to the   operative table.  The patient received an interscalene block in   the holding area by the attending anesthesiologist.  General anesthesia was induced   in the operating room without complication.  The patient did receive IV  Ancef prior to the commencement of the procedure.  The patient was   placed in the beach-chair position with the back raised  about 30   degrees.  The nonoperative extremity and head and neck were carefully   positioned and padded protecting against neurovascular compromise.  The   left upper extremity was then prepped and draped in the standard sterile   fashion.    The appropriate operative time-out was performed with   Anesthesia, the perioperative staff, as well as myself and we all agreed   that the left side was the correct operative site.  The patient received 1 g IV tranexamic acid at the start of the case around time of the incision. An approximately   10 cm incision was made from the tip of the coracoid to the center point of the   humerus at the level of the axilla.  Dissection was carried down sharply   through subcutaneous tissues and cephalic vein was identified and taken   laterally with the deltoid.  The pectoralis major was taken medially.  The   upper 1 cm of the pectoralis major was released from its attachment on   the humerus.  The clavipectoral fascia was incised just lateral to the   conjoined tendon.  This incision was carried up to but not into the   coracoacromial ligament.  Digital palpation was used to prove   integrity of the axillary nerve which was protected throughout the   procedure.  Musculocutaneous nerve was not palpated in the operative   field.  Conjoined tendon was then retracted  gently medially and the   deltoid laterally.  Anterior circumflex humeral vessels were clamped and   coagulated.  The soft tissues overlying the biceps was incised and this   incision was carried across the transverse humeral ligament to the base   of the coracoid.  The biceps was noted to be severely degenerated. It was released from the superior labrum. The biceps was then tenodesed to the soft tissue just above   pectoralis major and the remaining portion of the biceps superiorly was   excised.  An osteotomy was performed at the lesser tuberosity.  The capsule was then   released all the way down  to the 6 o'clock position of the humeral head.   The humeral head was then delivered with simultaneous adduction,   extension and external rotation.  All humeral osteophytes were removed   and the anatomic neck of the humerus was marked and cut free hand at   approximately 25 degrees retroversion within about 3 mm of the cuff   reflection posteriorly.  The head size was estimated to be a 48 medium   offset.  At that point, the humeral head was retracted posteriorly with   a Fukuda retractor.   Remaining portion of the capsule was released at the base of the   coracoid.  The remaining biceps anchor and the entire anterior-inferior   labrum was excised.  The posterior labrum was also excised but the   posterior capsule was not released.  The guidepin was placed bicortically with 0 elevated guide.  The reamer was used to ream to concentric bone with punctate bleeding.  This gave an excellent concentric surface.  The center hole was then drilled for an anchor peg glenoid followed by the three peripheral holes and none of the holes   exited the glenoid wall.  I then pulse irrigated these holes and dried   them with Surgicel.  The three peripheral holes were then   pressurized cemented and the anchor peg glenoid was placed and impacted   with an excellent fit.  The glenoid was a 51M component.  The proximal humerus was then again exposed taking care not to displace the glenoid.    The entry awl was used followed by sounding reamers and then sequentially broached from size 1 to 3. This was then left in place and the calcar planer was used. Trial head was placed with a 48.  With the trial implantation of the component, there was approximately 50% posterior translation with immediate snap back to the   anatomic position.  With forward elevation, there was no tendency   towards posterior subluxation.   The trial was removed and the final implant was prepared on a back table.  The trial was removed and the  final implant was prepared on a back table.   3 small holes were drilled on the medial side of the lesser tuberosity osteotomy, through which 2 labral tapes were passed. The implant was then placed through the loop of the 2 labral tapes and impacted with an excellent press-fit. This achieved excellent anatomic reconstruction of the proximal humerus.  The joint was then copiously irrigated with pulse lavage.  The subscapularis and   lesser tuberosity osteotomy were then repaired using the 2 labral tapes previously passed in a double row fashion with horizontal mattress sutures medially brought over through bone tunnels tied over a bone bridge laterally.   One #1 Ethibond was placed at the rotator interval just above  the lesser tuberosity. Copious irrigation was used. Throughout the case a mixture of 20 mL liposomal bupivacaine and 40 mL normal saline was used to infiltrate the deep capsular tissue, bony surfaces and subcutaneous tissue. Skin was closed with 2-0 Vicryl sutures in the deep dermal layer and 4-0 Monocryl in a subcuticular  running fashion.  Sterile dressings were then applied including Aquacel.  The patient was placed in a sling and allowed to awaken from general anesthesia and taken to the recovery room in stable condition.      POSTOPERATIVE PLAN:  Early passive range of motion will be allowed with the goal of 0 degrees external rotation and 90 degrees forward elevation.  No internal rotation at this time.  No active motion of the arm until the lesser tuberosity heals.  The patient will likely be kept in the hospital for 1-2 days and then discharged home.

## 2017-04-19 NOTE — Anesthesia Procedure Notes (Signed)
Procedure Name: Intubation Date/Time: 04/19/2017 7:40 AM Performed by: Neldon Newport Pre-anesthesia Checklist: Timeout performed, Patient being monitored, Suction available, Emergency Drugs available and Patient identified Patient Re-evaluated:Patient Re-evaluated prior to inductionOxygen Delivery Method: Circle system utilized Preoxygenation: Pre-oxygenation with 100% oxygen Intubation Type: IV induction Ventilation: Mask ventilation without difficulty and Oral airway inserted - appropriate to patient size Laryngoscope Size: Mac and 4 Grade View: Grade I Tube type: Oral Tube size: 7.0 mm Number of attempts: 1 Placement Confirmation: breath sounds checked- equal and bilateral,  positive ETCO2 and ETT inserted through vocal cords under direct vision Secured at: 22 cm Tube secured with: Tape Dental Injury: Teeth and Oropharynx as per pre-operative assessment

## 2017-04-19 NOTE — Transfer of Care (Signed)
Immediate Anesthesia Transfer of Care Note  Patient: Mariah Kelley  Procedure(s) Performed: Procedure(s) with comments: TOTAL SHOULDER ARTHROPLASTY (Left) - Left total shoulder arthroplasty  Patient Location: PACU  Anesthesia Type:General  Level of Consciousness: awake, alert  and oriented  Airway & Oxygen Therapy: Patient Spontanous Breathing and Patient connected to nasal cannula oxygen  Post-op Assessment: Report given to RN and Post -op Vital signs reviewed and stable  Post vital signs: Reviewed and stable  Last Vitals:  Vitals:   04/19/17 0553 04/19/17 0940  BP: (!) 148/73   Pulse: 73   Resp: 18   Temp: 36.8 C 36.6 C    Last Pain:  Vitals:   04/19/17 0553  TempSrc: Oral      Patients Stated Pain Goal: 4 (62/83/66 2947)  Complications: No apparent anesthesia complications

## 2017-04-19 NOTE — Anesthesia Preprocedure Evaluation (Addendum)
Anesthesia Evaluation  Patient identified by MRN, date of birth, ID band Patient awake    Reviewed: Allergy & Precautions, H&P , NPO status , Patient's Chart, lab work & pertinent test results  Airway Mallampati: I  TM Distance: >3 FB Neck ROM: full    Dental  (+) Teeth Intact, Dental Advidsory Given   Pulmonary sleep apnea , former smoker,    breath sounds clear to auscultation       Cardiovascular hypertension, + CAD, + Past MI and + Cardiac Stents   Rhythm:regular Rate:Normal     Neuro/Psych    GI/Hepatic   Endo/Other  diabetes, Type 2Morbid obesity  Renal/GU      Musculoskeletal  (+) Arthritis ,   Abdominal   Peds  Hematology   Anesthesia Other Findings   Reproductive/Obstetrics                           Anesthesia Physical Anesthesia Plan  ASA: III  Anesthesia Plan: General   Post-op Pain Management:  Regional for Post-op pain   Induction: Intravenous  PONV Risk Score and Plan: 3 and Ondansetron, Dexamethasone, Propofol and Midazolam  Airway Management Planned: Oral ETT  Additional Equipment:   Intra-op Plan:   Post-operative Plan: Extubation in OR  Informed Consent: I have reviewed the patients History and Physical, chart, labs and discussed the procedure including the risks, benefits and alternatives for the proposed anesthesia with the patient or authorized representative who has indicated his/her understanding and acceptance.   Dental Advisory Given  Plan Discussed with: Anesthesiologist, CRNA and Surgeon  Anesthesia Plan Comments:        Anesthesia Quick Evaluation

## 2017-04-19 NOTE — Anesthesia Procedure Notes (Signed)
Anesthesia Regional Block: Interscalene brachial plexus block   Pre-Anesthetic Checklist: ,, timeout performed, Correct Patient, Correct Site, Correct Laterality, Correct Procedure, Correct Position, site marked, Risks and benefits discussed,  Surgical consent,  Pre-op evaluation,  At surgeon's request and post-op pain management  Laterality: Left  Prep: chloraprep       Needles:  Injection technique: Single-shot  Needle Type: Echogenic Stimulator Needle     Needle Length: 5cm  Needle Gauge: 22     Additional Needles:   Procedures: ultrasound guided, nerve stimulator,,,,,,   Nerve Stimulator or Paresthesia:  Response: biceps flexion, 0.45 mA,   Additional Responses:   Narrative:  Start time: 04/19/2017 7:03 AM End time: 04/19/2017 7:13 AM Injection made incrementally with aspirations every 5 mL.  Performed by: Personally  Anesthesiologist: Lynnsey Barbara  Additional Notes: Functioning IV was confirmed and monitors were applied.  A 2mm 22ga Arrow echogenic stimulator needle was used. Sterile prep and drape,hand hygiene and sterile gloves were used.  Negative aspiration and negative test dose prior to incremental administration of local anesthetic. The patient tolerated the procedure well.  Ultrasound guidance: relevent anatomy identified, needle position confirmed, local anesthetic spread visualized around nerve(s), vascular puncture avoided.  Image printed for medical record.

## 2017-04-20 ENCOUNTER — Other Ambulatory Visit: Payer: Self-pay | Admitting: Cardiovascular Disease

## 2017-04-20 ENCOUNTER — Encounter (HOSPITAL_COMMUNITY): Payer: Self-pay | Admitting: Orthopedic Surgery

## 2017-04-20 LAB — BASIC METABOLIC PANEL
ANION GAP: 9 (ref 5–15)
BUN: 14 mg/dL (ref 6–20)
CALCIUM: 9.1 mg/dL (ref 8.9–10.3)
CO2: 28 mmol/L (ref 22–32)
CREATININE: 0.91 mg/dL (ref 0.44–1.00)
Chloride: 98 mmol/L — ABNORMAL LOW (ref 101–111)
Glucose, Bld: 156 mg/dL — ABNORMAL HIGH (ref 65–99)
Potassium: 4.8 mmol/L (ref 3.5–5.1)
Sodium: 135 mmol/L (ref 135–145)

## 2017-04-20 LAB — CBC
HCT: 37.5 % (ref 36.0–46.0)
Hemoglobin: 11.6 g/dL — ABNORMAL LOW (ref 12.0–15.0)
MCH: 28.1 pg (ref 26.0–34.0)
MCHC: 30.9 g/dL (ref 30.0–36.0)
MCV: 90.8 fL (ref 78.0–100.0)
PLATELETS: 274 10*3/uL (ref 150–400)
RBC: 4.13 MIL/uL (ref 3.87–5.11)
RDW: 13.9 % (ref 11.5–15.5)
WBC: 10.8 10*3/uL — AB (ref 4.0–10.5)

## 2017-04-20 LAB — GLUCOSE, CAPILLARY: Glucose-Capillary: 137 mg/dL — ABNORMAL HIGH (ref 65–99)

## 2017-04-20 MED ORDER — OXYCODONE-ACETAMINOPHEN 5-325 MG PO TABS: 1.0000 | ORAL_TABLET | ORAL | 0 refills | Status: DC | PRN

## 2017-04-20 MED ORDER — ONDANSETRON HCL 4 MG PO TABS: 4.0000 mg | ORAL_TABLET | Freq: Every day | ORAL | 1 refills | Status: DC | PRN

## 2017-04-20 MED ORDER — DOCUSATE SODIUM 100 MG PO CAPS: 100.0000 mg | ORAL_CAPSULE | Freq: Three times a day (TID) | ORAL | 0 refills | Status: DC | PRN

## 2017-04-20 NOTE — Progress Notes (Signed)
   PATIENT ID: Mariah Kelley   1 Day Post-Op Procedure(s) (LRB): TOTAL SHOULDER ARTHROPLASTY (Left)  Subjective: Doing well, mod pain overnight controlled with pain rx. Some nausea secondary to pain rx. No other complaints.   Objective:  Vitals:   04/20/17 0008 04/20/17 0444  BP: (!) 142/76 (!) 166/57  Pulse: 92 70  Resp: 18 18  Temp: 98.3 F (36.8 C) 98.6 F (37 C)     L UE dressing with small area dried blood Wiggles fingers, distally NVI  Labs:   Recent Labs  04/20/17 0504  HGB 11.6*   Recent Labs  04/20/17 0504  WBC 10.8*  RBC 4.13  HCT 37.5  PLT 274   Recent Labs  04/20/17 0504  NA 135  K 4.8  CL 98*  CO2 28  BUN 14  CREATININE 0.91  GLUCOSE 156*  CALCIUM 9.1    Assessment and Plan: 1 day s/p L TSA Minimize pain rx. D/c on percocet OT- PROM goal to 90 FF, 0 ER D/c home today when cleared by OT Fu with Dr. Tamera Punt in 2 week  VTE proph: plavix, SCDs

## 2017-04-20 NOTE — Telephone Encounter (Signed)
REFILL 

## 2017-04-20 NOTE — Evaluation (Signed)
Occupational Therapy Evaluation Patient Details Name: Mariah Kelley MRN: [id number] DOB: [birth year] Today's Date: 04/20/2017    History of Present Illness Pt is a 66 y/o F s/p L TSA   Clinical Impression   This 66 y/o F presents with the above. Prior to admission Pt was independent with ADLs, IADLs, and functional mobility. Pt currently requires MinA for ADLs secondary to adhering to shoulder precautions. Pt plans to return home with initial 24 hr assist from a friend. Reviewed with Pt shoulder protocol including precautions, HEP, and compensatory techniques for safely completing ADLs with Pt demonstrating good understanding throughout session. Education provided and questions answered throughout. No further acute OT needs at this time. Will sign off.     Follow Up Recommendations  DC plan and follow up therapy as arranged by surgeon;Supervision/Assistance - 24 hour (supervision initially )    Equipment Recommendations  None recommended by OT           Precautions / Restrictions Precautions Precautions: Shoulder Shoulder Interventions: Shoulder sling/immobilizer;At all times;Off for dressing/bathing/exercises Precaution Booklet Issued: Yes (comment) Precaution Comments: shoulder protocol handout issued  Restrictions Weight Bearing Restrictions: Yes LUE Weight Bearing: Non weight bearing      Mobility Bed Mobility Overal bed mobility: Modified Independent                Transfers Overall transfer level: Independent Equipment used: None             General transfer comment: supervision for safety during sit to/from stand     Balance Overall balance assessment: No apparent balance deficits (not formally assessed)                                         ADL either performed or assessed with clinical judgement   ADL Overall ADL's : Needs assistance/impaired Eating/Feeding: Independent   Grooming: Wash/dry face;Set up;Sitting   Upper  Body Bathing: Sitting;Minimal assistance;Adhering to UE precautions   Lower Body Bathing: Min guard;Sit to/from stand   Upper Body Dressing : Minimal assistance;Sitting;Adhering to UE precautions Upper Body Dressing Details (indicate cue type and reason): donning overhead dress  Lower Body Dressing: Sit to/from stand;Min guard Lower Body Dressing Details (indicate cue type and reason): donning underwear  Toilet Transfer: Min guard;Ambulation;Regular Museum/gallery exhibitions officer and Hygiene: Min guard;Sit to/from stand   Tub/ Banker: Min guard   Functional mobility during ADLs: Supervision/safety General ADL Comments: Education provided on compensatory techniques for completing ADLs while adhering to shoulder precautions after return home with Pt demonstrating good follow through and understanding      Vision Baseline Vision/History: No visual deficits Vision Assessment?: No apparent visual deficits                Pertinent Vitals/Pain Pain Assessment: 0-10 Pain Score: 3  Pain Descriptors / Indicators: Aching;Sore Pain Intervention(s): Limited activity within patient's tolerance;Ice applied;Monitored during session;Repositioned     Hand Dominance Right   Extremity/Trunk Assessment Upper Extremity Assessment Upper Extremity Assessment: LUE deficits/detail (RUE WFL ) LUE Deficits / Details: LUE limitations secondary to shoulder precautions LUE: Unable to fully assess due to immobilization   Lower Extremity Assessment Lower Extremity Assessment: Overall WFL for tasks assessed       Communication Communication Communication: No difficulties   Cognition Arousal/Alertness: Awake/alert Behavior During Therapy: WFL for tasks assessed/performed Overall Cognitive Status: Within Functional Limits for tasks  assessed                                     General Comments       Exercises General Exercises - Upper Extremity Shoulder  Flexion: PROM;Self ROM;Left;10 reps;Supine (forward flexion 0-90) Elbow Flexion: AROM;10 reps;Left;Supine Elbow Extension: AROM;Left;Supine;10 reps Wrist Flexion: AROM;10 reps;Left;Supine Wrist Extension: AROM;10 reps;Left;Supine Digit Composite Flexion: AROM;10 reps;Left;Supine Composite Extension: AROM;Left;Supine;10 reps Shoulder Exercises Neck Flexion: AROM;Seated Neck Extension: AROM;Seated Neck Lateral Flexion - Right: AROM;Seated Neck Lateral Flexion - Left: AROM;Seated   Shoulder Instructions Shoulder Instructions Donning/doffing shirt without moving shoulder: Minimal assistance;Patient able to independently direct caregiver Method for sponge bathing under operated UE: Supervision/safety;Patient able to independently direct caregiver Donning/doffing sling/immobilizer: Minimal assistance;Patient able to independently direct caregiver Correct positioning of sling/immobilizer: Minimal assistance;Patient able to independently direct caregiver ROM for elbow, wrist and digits of operated UE: Independent Sling wearing schedule (on at all times/off for ADL's): Independent Proper positioning of operated UE when showering: Independent Positioning of UE while sleeping: Minimal assistance;Patient able to independently direct caregiver    Home Living Family/patient expects to be discharged to:: Private residence Living Arrangements: Non-relatives/Friends Available Help at Discharge: Friend(s);Available 24 hours/day Type of Home: House Home Access: Stairs to enter [employer] of Steps: 3 Entrance Stairs-Rails: Right Home Layout: One level     Bathroom Shower/Tub: Teacher, early years/pre: Standard     Home Equipment: Cane - single point;Walker - 2 wheels;Grab bars - tub/shower;Shower seat;Toilet riser          Prior Functioning/Environment Level of Independence: Independent                 OT Problem List: Decreased range of motion;Impaired UE  functional use            OT Goals(Current goals can be found in the care plan section) Acute Rehab OT Goals Patient Stated Goal: to return to independence  OT Goal Formulation: With patient                                 AM-PAC PT "6 Clicks" Daily Activity     Outcome Measure Help from another person eating meals?: None Help from another person taking care of personal grooming?: A Little Help from another person toileting, which includes using toliet, bedpan, or urinal?: A Little Help from another person bathing (including washing, rinsing, drying)?: A Little Help from another person to put on and taking off regular upper body clothing?: A Little Help from another person to put on and taking off regular lower body clothing?: A Little 6 Click Score: 19   End of Session Equipment Utilized During Treatment: Other (comment) (sling ) Nurse Communication: Mobility status  Activity Tolerance: Patient tolerated treatment well Patient left: in chair;with call bell/phone within reach;with nursing/sitter in room  OT Visit Diagnosis: Muscle weakness (generalized) (M62.81);Other (comment) (UE impairments )                Time: 2707-8675 OT Time Calculation (min): 36 min Charges:  OT General Charges $OT Visit: 1 Procedure OT Evaluation $OT Eval Low Complexity: 1 Procedure OT Treatments $Self Care/Home Management : 23-37 mins G-Codes:     Lou Cal, OT Pager [phone] 04/20/2017   Raymondo Band 04/20/2017, 9:47 AM

## 2017-04-20 NOTE — Progress Notes (Signed)
Reviewed discharge instructions/medications with patient.  Answered her questions. Patient's nausea is much better and is stable for discharge.  Waiting on volunteer to bring wheelchair.

## 2017-04-20 NOTE — Discharge Summary (Signed)
Patient ID: Mariah Kelley MRN: [id number] DOB/AGE: 67/20/1952 67 y.o.  Admit date: 04/19/2017 Discharge date: 04/20/2017  Admission Diagnoses:  Active Problems:   S/P shoulder replacement, left   Discharge Diagnoses:  Same  Past Medical History:  Diagnosis Date  . Arthritis   . Breast cancer of upper-outer quadrant of right female breast ([place]) 12/23/2014  . Bruises easily   . CAD S/P percutaneous coronary angioplasty 01/17/2014   takes Brilinta and Plavix daily  . Cataracts, bilateral   . Diabetes mellitus without complication ([place])   . History of bronchitis 7-61yrs ago  . History of tobacco abuse    40 pack-years  . Hyperlipidemia    takes Lipitor daily  . Hypertension    takes Metoprolol daily  . Joint pain   . Obesity   . ST elevation myocardial infarction (STEMI) of inferolateral wall ([place]) 01/17/2014    Surgeries: Procedure(s): TOTAL SHOULDER ARTHROPLASTY on 04/19/2017   Consultants:   Discharged Condition: Improved  Hospital Course: ROSHELL BRIGHAM is an 66 y.o. female who was admitted 04/19/2017 for operative treatment of right shoulder end stage osteoarthitis. Patient has severe unremitting pain that affects sleep, daily activities, and work/hobbies. After pre-op clearance the patient was taken to the operating room on 04/19/2017 and underwent  Procedure(s): TOTAL SHOULDER ARTHROPLASTY.    Patient was given perioperative antibiotics: Anti-infectives    Start     Dose/Rate Route Frequency Ordered Stop   04/19/17 1300  ceFAZolin (ANCEF) IVPB 2g/100 mL premix     2 g 200 mL/hr over 30 Minutes Intravenous Every 6 hours 04/19/17 1003 04/20/17 0144   04/19/17 0700  ceFAZolin (ANCEF) 3 g in dextrose 5 % 50 mL IVPB     3 g 130 mL/hr over 30 Minutes Intravenous To ShortStay Surgical 04/18/17 0725 04/19/17 0747       Patient was given sequential compression devices, early ambulation, and chemoprophylaxis to prevent DVT.  Patient benefited maximally from  hospital stay and there were no complications.    Recent vital signs: Patient Vitals for the past 24 hrs:  BP Temp Temp src Pulse Resp SpO2  04/20/17 0600 - - - - - 96 %  04/20/17 0444 (!) 166/57 98.6 F (37 C) Oral 70 18 95 %  04/20/17 0008 (!) 142/76 98.3 F (36.8 C) Oral 92 18 94 %  04/19/17 2042 (!) 154/72 98 F (36.7 C) Oral 94 18 96 %  04/19/17 1700 (!) 158/63 98 F (36.7 C) Oral 77 18 95 %  04/19/17 1100 (!) 172/59 98.1 F (36.7 C) Oral 83 16 95 %  04/19/17 1045 - 98 F (36.7 C) - 87 17 94 %  04/19/17 1030 (!) 152/64 - - 86 18 94 %  04/19/17 1015 (!) 161/66 - - 85 (!) 23 99 %  04/19/17 1000 (!) 162/62 - - 87 16 97 %  04/19/17 0945 - - - - - 94 %  04/19/17 0940 (!) 171/91 97.8 F (36.6 C) - 91 (!) 22 94 %     Recent laboratory studies:  Recent Labs  04/20/17 0504  WBC 10.8*  HGB 11.6*  HCT 37.5  PLT 274  NA 135  K 4.8  CL 98*  CO2 28  BUN 14  CREATININE 0.91  GLUCOSE 156*  CALCIUM 9.1     Discharge Medications:   Allergies as of 04/20/2017      Reactions   Prednisone Other (See Comments)   "made poison ivy worse", rebound effect when take prednisone  Codeine Nausea Only, Other (See Comments)   HEADACHE      Medication List    TAKE these medications   albuterol 108 (90 Base) MCG/ACT inhaler Commonly known as:  PROVENTIL HFA;VENTOLIN HFA Inhale 2 puffs into the lungs every 6 (six) hours as needed for wheezing or shortness of breath.   anastrozole 1 MG tablet Commonly known as:  ARIMIDEX Take 1 tablet (1 mg total) by mouth daily.   aspirin EC 81 MG tablet Take 81 mg by mouth daily.   atorvastatin 80 MG tablet Commonly known as:  LIPITOR Take 1 tablet (80 mg total) by mouth daily at 6 PM. NEED OV. What changed:  additional instructions   budesonide-formoterol 160-4.5 MCG/ACT inhaler Commonly known as:  SYMBICORT Inhale 2 puffs into the lungs daily as needed. What changed:  when to take this   clopidogrel 75 MG tablet Commonly known as:   PLAVIX TAKE 1 TABLET (75 MG TOTAL) BY MOUTH DAILY.   docusate sodium 100 MG capsule Commonly known as:  COLACE Take 1 capsule (100 mg total) by mouth 3 (three) times daily as needed.   metoprolol tartrate 50 MG tablet Commonly known as:  LOPRESSOR Take 1 tablet (50 mg total) by mouth 2 (two) times daily.   nitroGLYCERIN 0.4 MG SL tablet Commonly known as:  NITROSTAT PLACE 1 TABLET UNDER THE TONGUE EVERY 5 MINUTES AS NEEDED FOR CHEST PAIN.   ondansetron 4 MG tablet Commonly known as:  ZOFRAN Take 1 tablet (4 mg total) by mouth daily as needed for nausea or vomiting.   oxyCODONE-acetaminophen 5-325 MG tablet Commonly known as:  ROXICET Take 1-2 tablets by mouth every 4 (four) hours as needed for severe pain.   Vitamin D3 2000 units Tabs Take 2,000 Units by mouth daily.       Diagnostic Studies: Dg Chest 2 View  Result Date: 04/11/2017 CLINICAL DATA:  Preoperative examination prior total left shoulder joint replacement. History of coronary artery disease with stent placement, COPD, former smoker. EXAM: CHEST  2 VIEW COMPARISON:  CT scan chest of December 13, 2016 and chest x-ray of October 19, 2016. FINDINGS: The lungs are adequately inflated. The interstitial markings have become less conspicuous. There is no alveolar infiltrate. The heart and pulmonary vascularity are normal. There is calcification in the wall of the aortic arch. There is no pleural effusion. The bony thorax exhibits no acute abnormality. IMPRESSION: COPD-smoking related changes. No pneumonia, CHF, nor other acute cardiopulmonary abnormality. Thoracic aortic atherosclerosis. Electronically Signed   By: David  Martinique M.D.   On: 04/11/2017 09:47   Ct Shoulder Left Wo Contrast  Result Date: 04/10/2017 CLINICAL DATA:  Preoperative examination patient for left shoulder surgery. Chronic left shoulder pain. EXAM: CT OF THE UPPER LEFT EXTREMITY WITHOUT CONTRAST TECHNIQUE: Multidetector CT imaging of the upper left extremity  was performed according to the standard protocol. COMPARISON:  Plain films left shoulder 03/19/2017. FINDINGS: Bones/Joint/Cartilage There is bone-on-bone joint space narrowing of the left shoulder. There is some remodeling and flattening of the glenoid and articular surface of the humeral head. Large osteophyte off the humeral head is identified. Sclerotic lesion measuring 0.8 cm in diameter in the subarticular humeral head is most consistent with a bone island. Little to no subchondral cyst formation is seen about the joint. Mild to moderate glenohumeral osteoarthritis is noted Ligaments Suboptimally assessed by CT. Muscles and Tendons The rotator cuff appears intact. Musculature about the shoulder girdle is preserved. Soft tissues All imaged lung parenchyma is clear.  Calcific aortic and coronary atherosclerosis is noted. IMPRESSION: Marked glenohumeral osteoarthritis as described above. No acute abnormality. The rotator cuff appears intact and normal. Moderate acromioclavicular osteoarthritis. Calcific aortic and coronary atherosclerosis. Electronically Signed   By: Inge Rise M.D.   On: 04/10/2017 08:58   Dg Shoulder Left Port  Result Date: 04/19/2017 CLINICAL DATA:  Status post total shoulder replacement EXAM: LEFT SHOULDER - 1 VIEW COMPARISON:  None. FINDINGS: There is a left proximal humeral prosthesis on the left well-seated on frontal view. There is osteoarthritic change in the acromioclavicular joint. No fracture or dislocation. Visualized left lung is clear. IMPRESSION: Left proximal humeral prosthesis well-seated. No fracture or dislocation. There is moderate osteoarthritic change in the acromioclavicular joint on the left. Electronically Signed   By: Lowella Grip III M.D.   On: 04/19/2017 10:01    Disposition: 01-Home or Self Care  Discharge Instructions    Call MD / Call 911    Complete by:  As directed    If you experience chest pain or shortness of breath, CALL 911 and be  transported to the hospital emergency room.  If you develope a fever above 101 F, pus (white drainage) or increased drainage or redness at the wound, or calf pain, call your surgeon's office.   Constipation Prevention    Complete by:  As directed    Drink plenty of fluids.  Prune juice may be helpful.  You may use a stool softener, such as Colace (over the counter) 100 mg twice a day.  Use MiraLax (over the counter) for constipation as needed.   Diet - low sodium heart healthy    Complete by:  As directed    Increase activity slowly as tolerated    Complete by:  As directed       Follow-up Information    Tania Ade, MD. Schedule an appointment as soon as possible for a visit in 2 weeks.   Specialty:  Orthopedic Surgery Contact information: [address] [place] [address] [phone]            Signed: Grier Mitts 04/20/2017, 8:14 AM

## 2017-04-21 ENCOUNTER — Other Ambulatory Visit: Payer: Self-pay | Admitting: Cardiovascular Disease

## 2017-05-01 ENCOUNTER — Ambulatory Visit (HOSPITAL_BASED_OUTPATIENT_CLINIC_OR_DEPARTMENT_OTHER): Payer: 59 | Attending: Cardiovascular Disease | Admitting: Cardiology

## 2017-05-01 DIAGNOSIS — G4733 Obstructive sleep apnea (adult) (pediatric): Secondary | ICD-10-CM | POA: Insufficient documentation

## 2017-05-01 DIAGNOSIS — R0902 Hypoxemia: Secondary | ICD-10-CM | POA: Diagnosis not present

## 2017-05-10 ENCOUNTER — Telehealth: Payer: Self-pay | Admitting: Cardiovascular Disease

## 2017-05-10 ENCOUNTER — Telehealth: Payer: Self-pay | Admitting: *Deleted

## 2017-05-10 DIAGNOSIS — G4733 Obstructive sleep apnea (adult) (pediatric): Secondary | ICD-10-CM

## 2017-05-10 NOTE — Telephone Encounter (Signed)
Informed patient of titration results and she verbalized understanding. Patient understands she will be contacted by [facility] to set up her cpap. She understands to call if [facility] does not contact her with new setup in a timely manner. She understands she will be called once confirmation has been received from [facility] that she has received her new machine to schedule 10 week follow up appointment.  [name] notified of new cpap order in epic Please add to Mariah Kelley She was grateful for the call and thanked me

## 2017-05-10 NOTE — Telephone Encounter (Signed)
Patient calling for results from sleep study.

## 2017-05-10 NOTE — Telephone Encounter (Addendum)
Patient called back to say her insurance has changed as of 05/06/2017 from [facility] to Medicare A & B ([id number])  with a supplement which is [employer] G [phone]). [place] notified.

## 2017-05-10 NOTE — Procedures (Addendum)
   Patient Name: Mariah Kelley, Mariah Kelley Date: 05/01/2017 Gender: Female D.O.B: 1951/09/29 Age (years): 65 Referring Provider: Lorretta Harp Height (inches): 17 Interpreting Physician: Fransico Him MD, ABSM Weight (lbs): 277 RPSGT: Jacolyn Reedy BMI: 41 MRN: [id number] Neck Size: 16.00  CLINICAL INFORMATION Sleep Study Type: HST Indication for sleep study: OSA Epworth Sleepiness Score: 19  SLEEP STUDY TECHNIQUE A multi-channel overnight portable sleep study was performed. The channels recorded were: nasal airflow, thoracic respiratory movement, and oxygen saturation with a pulse oximetry. Snoring was also monitored.  MEDICATIONS Patient self administered medications include: N/A.  SLEEP ARCHITECTURE Patient was studied for 375.0 minutes. The sleep efficiency was 95.5 % and the patient was supine for 99.7%. The arousal index was 0.0 per hour.  RESPIRATORY PARAMETERS The overall AHI was 19.0 per hour, with a central apnea index of 0.0 per hour.  The oxygen nadir was 62% during sleep.  CARDIAC DATA Mean heart rate during sleep was 75.0 bpm.  IMPRESSIONS - Moderate obstructive sleep apnea occurred during this study (AHI = 19.0/h). - No significant central sleep apnea occurred during this study (CAI = 0.0/h). - Severe oxygen desaturation was noted during this study (Min O2 = 62%). - Patient snored 9.5% during the sleep.  DIAGNOSIS - Obstructive Sleep Apnea (327.23 [G47.33 ICD-10]) - Nocturnal Hypoxemia (327.26 [G47.36 ICD-10])  RECOMMENDATIONS - Recommend in lab CPAP titration due to severity of hypoxemia and sleep apnea.  - Avoid alcohol, sedatives and other CNS depressants that may worsen sleep apnea and disrupt normal sleep architecture. - Sleep hygiene should be reviewed to assess factors that may improve sleep quality. - Weight management and regular exercise should be initiated or continued. - Patient may benefit from in-lab study   [place], [place] of Sleep [facility]  ELECTRONICALLY SIGNED ON:  05/10/2017, 8:30 PM [place] PH: [phone]   FX: [phone] [place]

## 2017-05-10 NOTE — Telephone Encounter (Signed)
Routing over to [facility].

## 2017-05-10 NOTE — Telephone Encounter (Signed)
-----   Message from Sueanne Margarita, MD sent at 05/10/2017  2:03 PM EDT ----- Please let patient know that they have significant sleep apnea and had successful CPAP titration and will be set up with CPAP unit.  Please let DME know that order is in EPIC.  Please set patient up for OV in 10 weeks

## 2017-05-10 NOTE — Telephone Encounter (Signed)
Informed patient of titration results and she verbalized understanding. Patient understands she will be contacted by [facility] to set up her cpap. She understands to call if [facility] does not contact her with new setup in a timely manner. She understands she will be called once confirmation has been received from [facility] that she has received her new machine to schedule 10 week follow up appointment.  [name] notified of new cpap order in epic Please add to Mariah Kelley She was grateful for the call and thanked me

## 2017-05-15 ENCOUNTER — Encounter: Payer: Self-pay | Admitting: *Deleted

## 2017-05-15 NOTE — Telephone Encounter (Addendum)
Informed patient of upcoming titration study and patient understanding was verbalized. Patient understands her oxygen drops significantly. Patient understands her sleep study is scheduled for Friday  June 08 2017. Patient understands her sleep study will be done at [place] lab. Patient understands she will receive a sleep packet in a week or so. Patient understands to call if she does not receive the sleep packet in a timely manner. Patient agrees with treatment and thanked me for call.

## 2017-05-15 NOTE — Telephone Encounter (Signed)
-----   Message from Sueanne Margarita, MD sent at 05/10/2017  8:33 PM EDT ----- Home Sleep study showed moderate OSA with significant hypoxemia - please set up for in lab CPAP titation

## 2017-05-25 ENCOUNTER — Other Ambulatory Visit: Payer: Self-pay | Admitting: Cardiovascular Disease

## 2017-06-01 DIAGNOSIS — M19012 Primary osteoarthritis, left shoulder: Secondary | ICD-10-CM | POA: Diagnosis not present

## 2017-06-06 DIAGNOSIS — Z96612 Presence of left artificial shoulder joint: Secondary | ICD-10-CM | POA: Diagnosis not present

## 2017-06-06 DIAGNOSIS — M25612 Stiffness of left shoulder, not elsewhere classified: Secondary | ICD-10-CM | POA: Diagnosis not present

## 2017-06-06 DIAGNOSIS — M25512 Pain in left shoulder: Secondary | ICD-10-CM | POA: Diagnosis not present

## 2017-06-07 ENCOUNTER — Encounter: Payer: Self-pay | Admitting: *Deleted

## 2017-06-07 DIAGNOSIS — Z96612 Presence of left artificial shoulder joint: Secondary | ICD-10-CM | POA: Diagnosis not present

## 2017-06-07 DIAGNOSIS — M25512 Pain in left shoulder: Secondary | ICD-10-CM | POA: Diagnosis not present

## 2017-06-07 DIAGNOSIS — M25612 Stiffness of left shoulder, not elsewhere classified: Secondary | ICD-10-CM | POA: Diagnosis not present

## 2017-06-07 NOTE — Telephone Encounter (Deleted)
Patient has a compliance range of 06/24/17-08/23/17 for insurance purposes.  Patient has an appointment scheduled for Friday 08/17/17 at 7:40 am for insurance compliance. Patient understands she needs to keep this appointment.

## 2017-06-07 NOTE — Telephone Encounter (Signed)
This encounter was created in error - please disregard.

## 2017-06-07 NOTE — Telephone Encounter (Signed)
Patient has a compliance range of 06/24/17-08/23/17 for insurance purposes.  Patient has an appointment scheduled for Friday 08/17/17 at 7:40 am for insurance compliance. Patient understands she needs to keep this appointment.

## 2017-06-11 DIAGNOSIS — M25612 Stiffness of left shoulder, not elsewhere classified: Secondary | ICD-10-CM | POA: Diagnosis not present

## 2017-06-11 DIAGNOSIS — Z96612 Presence of left artificial shoulder joint: Secondary | ICD-10-CM | POA: Diagnosis not present

## 2017-06-11 DIAGNOSIS — M25512 Pain in left shoulder: Secondary | ICD-10-CM | POA: Diagnosis not present

## 2017-06-15 DIAGNOSIS — M25612 Stiffness of left shoulder, not elsewhere classified: Secondary | ICD-10-CM | POA: Diagnosis not present

## 2017-06-15 DIAGNOSIS — Z96612 Presence of left artificial shoulder joint: Secondary | ICD-10-CM | POA: Diagnosis not present

## 2017-06-15 DIAGNOSIS — M25512 Pain in left shoulder: Secondary | ICD-10-CM | POA: Diagnosis not present

## 2017-06-19 DIAGNOSIS — M25612 Stiffness of left shoulder, not elsewhere classified: Secondary | ICD-10-CM | POA: Diagnosis not present

## 2017-06-19 DIAGNOSIS — M25512 Pain in left shoulder: Secondary | ICD-10-CM | POA: Diagnosis not present

## 2017-06-19 DIAGNOSIS — Z96612 Presence of left artificial shoulder joint: Secondary | ICD-10-CM | POA: Diagnosis not present

## 2017-06-21 DIAGNOSIS — H43812 Vitreous degeneration, left eye: Secondary | ICD-10-CM | POA: Diagnosis not present

## 2017-06-21 DIAGNOSIS — M25512 Pain in left shoulder: Secondary | ICD-10-CM | POA: Diagnosis not present

## 2017-06-21 DIAGNOSIS — M25612 Stiffness of left shoulder, not elsewhere classified: Secondary | ICD-10-CM | POA: Diagnosis not present

## 2017-06-21 DIAGNOSIS — T8522XS Displacement of intraocular lens, sequela: Secondary | ICD-10-CM | POA: Diagnosis not present

## 2017-06-21 DIAGNOSIS — Z96612 Presence of left artificial shoulder joint: Secondary | ICD-10-CM | POA: Diagnosis not present

## 2017-06-21 DIAGNOSIS — H43392 Other vitreous opacities, left eye: Secondary | ICD-10-CM | POA: Diagnosis not present

## 2017-06-25 ENCOUNTER — Encounter: Payer: Self-pay | Admitting: Cardiology

## 2017-06-26 DIAGNOSIS — Z96612 Presence of left artificial shoulder joint: Secondary | ICD-10-CM | POA: Diagnosis not present

## 2017-06-26 DIAGNOSIS — M25612 Stiffness of left shoulder, not elsewhere classified: Secondary | ICD-10-CM | POA: Diagnosis not present

## 2017-06-26 DIAGNOSIS — M25512 Pain in left shoulder: Secondary | ICD-10-CM | POA: Diagnosis not present

## 2017-06-28 ENCOUNTER — Ambulatory Visit: Payer: Medicare Other | Attending: Cardiology | Admitting: Cardiology

## 2017-06-28 DIAGNOSIS — I493 Ventricular premature depolarization: Secondary | ICD-10-CM | POA: Insufficient documentation

## 2017-06-28 DIAGNOSIS — G4733 Obstructive sleep apnea (adult) (pediatric): Secondary | ICD-10-CM | POA: Insufficient documentation

## 2017-06-28 DIAGNOSIS — R0902 Hypoxemia: Secondary | ICD-10-CM | POA: Diagnosis not present

## 2017-06-28 DIAGNOSIS — M25512 Pain in left shoulder: Secondary | ICD-10-CM | POA: Diagnosis not present

## 2017-06-28 DIAGNOSIS — Z96612 Presence of left artificial shoulder joint: Secondary | ICD-10-CM | POA: Diagnosis not present

## 2017-06-28 DIAGNOSIS — M25612 Stiffness of left shoulder, not elsewhere classified: Secondary | ICD-10-CM | POA: Diagnosis not present

## 2017-06-29 DIAGNOSIS — F4323 Adjustment disorder with mixed anxiety and depressed mood: Secondary | ICD-10-CM | POA: Diagnosis not present

## 2017-06-29 NOTE — Procedures (Addendum)
Patient Name: Mariah Kelley, Mariah Kelley Date: 06/28/2017   Gender: Female  D.O.B: 1950/12/16  Age (years): 66  Referring Provider: Lorretta Harp   Height (inches): 85  Interpreting Physician: Fransico Him MD, ABSM  Weight (lbs): 277  RPSGT: Peak, Robert   BMI: 41  MRN: [id number]  Neck Size: 16.00    CLINICAL INFORMATION  The patient is referred for a BiPAP titration to treat sleep apnea. Date of NPSG, Split Night or HST: 05/01/2017 SLEEP STUDY TECHNIQUE  As per the AASM Manual for the Scoring of Sleep and Associated Events v2.3 (April 2016) with a hypopnea requiring 4% desaturations. The channels recorded and monitored were frontal, central and occipital EEG, electrooculogram (EOG), submentalis EMG (chin), nasal and oral airflow, thoracic and abdominal wall motion, anterior tibialis EMG, snore microphone, electrocardiogram, and pulse oximetry. Bilevel positive airway pressure (BPAP) was initiated at the beginning of the study and titrated to treat sleep-disordered breathing. MEDICATIONS  Medications self-administered by patient taken the night of the study : SYMBICORT, METOPROLOL, ATORVASTATIN  RESPIRATORY PARAMETERS  Optimal IPAP Pressure (cm): 12 AHI at Optimal Pressure (/hr) 1.9  Optimal EPAP Pressure (cm): 8    Overall Minimal O2 (%): 79.00 Minimal O2 at Optimal Pressure (%): 80.0  SLEEP ARCHITECTURE  Start Time: 11:03:59 PM Stop Time: 5:15:11 AM Total Time (min): 371.2 Total Sleep Time (min): 335.0  Sleep Latency (min): 2.2 Sleep Efficiency (%): 90.2 REM Latency (min): 105.0 WASO (min): 34.0  Stage N1 (%): 7.61 Stage N2 (%): 68.06 Stage N3 (%): 7.31 Stage R (%): 17.01  Supine (%): 12.40 Arousal Index (/hr): 24.7      CARDIAC DATA  The 2 lead EKG demonstrated sinus rhythm. The mean heart rate was 73.86 beats per minute. Other EKG findings include: PVCs.  LEG MOVEMENT DATA  The total Periodic Limb Movements of Sleep (PLMS) were 97. The PLMS index was 17.37. A PLMS  index of <15 is considered normal in adults. IMPRESSIONS  - An optimal PAP pressure was selected for this patient ( 12/8 cm of water)  - Central sleep apnea was not noted during this titration (CAI = 1.3/h).  - Severe oxygen desaturations were observed during this titration (min O2 = 79.00%).  - No snoring was audible during this study.  - PVCs were observed during this study.  - Mild periodic limb movements were observed during this study. Arousals associated with PLMs were rare. DIAGNOSIS  - Obstructive Sleep Apnea (327.23 [G47.33 ICD-10])  - Nocturnal Hypoxemia  - PVCs RECOMMENDATIONS  - Patient was tried on CPAP but due to ongoing respiratory events could not be adequately titrated on CPAP.  The patient was able to be titrated on Bipap.   Recommend a Trial of BiPAP therapy on 12/8 cm H2O with a Small size Resmed Nasal Pillow Mask AirFit P10 for Her mask and heated humidification.  - Avoid alcohol, sedatives and other CNS depressants that may worsen sleep apnea and disrupt normal sleep architecture.  - Sleep hygiene should be reviewed to assess factors that may improve sleep quality.  - Weight management and regular exercise should be initiated or continued.  - Return to Sleep Center for re-evaluation after 10 weeks of therapy  - Overnight pulse oximetry on BiPAP to make sure patient does not have persistent nocturnal hypoxemia   [place], American Board of Sleep Medicine  ELECTRONICALLY SIGNED ON:  06/29/2017, 2:29 PM [place] PH: [phone]   FX: [phone] ACCREDITED BY  THE AMERICAN ACADEMY OF SLEEP MEDICINE

## 2017-07-03 DIAGNOSIS — M25612 Stiffness of left shoulder, not elsewhere classified: Secondary | ICD-10-CM | POA: Diagnosis not present

## 2017-07-03 DIAGNOSIS — Z96612 Presence of left artificial shoulder joint: Secondary | ICD-10-CM | POA: Diagnosis not present

## 2017-07-03 DIAGNOSIS — M25512 Pain in left shoulder: Secondary | ICD-10-CM | POA: Diagnosis not present

## 2017-07-03 DIAGNOSIS — Z09 Encounter for follow-up examination after completed treatment for conditions other than malignant neoplasm: Secondary | ICD-10-CM | POA: Diagnosis not present

## 2017-07-04 ENCOUNTER — Telehealth: Payer: Self-pay | Admitting: *Deleted

## 2017-07-04 DIAGNOSIS — Z9981 Dependence on supplemental oxygen: Secondary | ICD-10-CM

## 2017-07-04 DIAGNOSIS — G4733 Obstructive sleep apnea (adult) (pediatric): Secondary | ICD-10-CM

## 2017-07-04 NOTE — Telephone Encounter (Signed)
-----   Message from Sueanne Margarita, MD sent at 07/03/2017  4:28 PM EDT ----- Good AHI and compliance.  Continue current CPAP settings.

## 2017-07-04 NOTE — Telephone Encounter (Signed)
-----   Message from Sueanne Margarita, MD sent at 06/29/2017  2:31 PM EDT ----- Please let patient know that they had a successful PAP titration and let DME know that orders are in EPIC.  Please set up 10 week OV with me. Please order an overnight pulse ox on BIPAP to make sure supplemental O2 is not needed

## 2017-07-04 NOTE — Addendum Note (Signed)
Addended by: [name] on: 07/04/2017 05:03 PM   Modules accepted: Orders

## 2017-07-04 NOTE — Telephone Encounter (Addendum)
Informed patient of titration results and verbalized understanding was indicated. Patient understands her BIPAP titration was successful. Patient understands an overnight pulse ox on BIPAP has been ordered to make sure supplemental 02 is not needed. Patient understands she will be contacted by [facility] to set up her BIPAP. She understands to call if [facility] does not contact her with new setup in a timely manner. She understands she will be called once confirmation has been received from [facility] that she has received her new machine to schedule 10 week follow up appointment.  [place] notified of new BIPAP order in epic Please add to Mariah Kelley She was grateful for the call and thanked me

## 2017-07-04 NOTE — Telephone Encounter (Signed)
Informed patient of compliance results and verbalized understanding was indicated. Patient understands her events are in normal range and her compliance is good. Patient understands her settings will not change. Patient was grateful for the call and thanked me.

## 2017-07-05 DIAGNOSIS — Z96612 Presence of left artificial shoulder joint: Secondary | ICD-10-CM | POA: Diagnosis not present

## 2017-07-05 DIAGNOSIS — M25612 Stiffness of left shoulder, not elsewhere classified: Secondary | ICD-10-CM | POA: Diagnosis not present

## 2017-07-05 DIAGNOSIS — M25512 Pain in left shoulder: Secondary | ICD-10-CM | POA: Diagnosis not present

## 2017-07-05 NOTE — Telephone Encounter (Signed)
Please send this to [facility]  This patient dose not need an OV with me.  I addended by sleep study BiPAP titration to state that she was titrated on CPAP but due to ongoing respiraotyr events failed CPAP titration and was changed to BiPAP.  SHe does not need an office visit with me to discuss this before BiPAP is started - she justs needs it started.  She had not been on any therapy prior to this

## 2017-07-05 NOTE — Telephone Encounter (Addendum)
Call came in today from [facility] Barbaraann Rondo) stating the patient can not be set up on BIPAP because Per her insurance she does not have an office visit stating she was having any issues on her CPAP.  [facility] stated the patient needs to have a face to face office visit with Dr Radford Pax and then have another BIPAP titration to be able to get a BIPAP machine. Barbaraann Rondo has reached out to the patient with this information and has offered her the option to private pay for her BIPAP but the patient is not financially able to afford it.  Reached out to the patient and scheduled her for a face to face appointment with Dr Radford Pax for Monday September 17,2018.  Patient is very upset stating she has not slept in months and she was looking forward to finally getting her machine so she could sleep.

## 2017-07-06 DIAGNOSIS — R0902 Hypoxemia: Secondary | ICD-10-CM | POA: Diagnosis not present

## 2017-07-06 DIAGNOSIS — G4733 Obstructive sleep apnea (adult) (pediatric): Secondary | ICD-10-CM | POA: Diagnosis not present

## 2017-07-06 NOTE — Telephone Encounter (Signed)
Per [facility] Barbaraann Rondo) Patient had to have a face to face office visit with the Dr Radford Pax between 05/01/17 and 06/28/17 per her insurance.

## 2017-07-10 DIAGNOSIS — Z96612 Presence of left artificial shoulder joint: Secondary | ICD-10-CM | POA: Diagnosis not present

## 2017-07-10 DIAGNOSIS — M25612 Stiffness of left shoulder, not elsewhere classified: Secondary | ICD-10-CM | POA: Diagnosis not present

## 2017-07-10 DIAGNOSIS — M25512 Pain in left shoulder: Secondary | ICD-10-CM | POA: Diagnosis not present

## 2017-07-13 DIAGNOSIS — Z96612 Presence of left artificial shoulder joint: Secondary | ICD-10-CM | POA: Diagnosis not present

## 2017-07-13 DIAGNOSIS — M25512 Pain in left shoulder: Secondary | ICD-10-CM | POA: Diagnosis not present

## 2017-07-13 DIAGNOSIS — M25612 Stiffness of left shoulder, not elsewhere classified: Secondary | ICD-10-CM | POA: Diagnosis not present

## 2017-07-16 DIAGNOSIS — M25512 Pain in left shoulder: Secondary | ICD-10-CM | POA: Diagnosis not present

## 2017-07-16 DIAGNOSIS — Z96612 Presence of left artificial shoulder joint: Secondary | ICD-10-CM | POA: Diagnosis not present

## 2017-07-16 DIAGNOSIS — M25612 Stiffness of left shoulder, not elsewhere classified: Secondary | ICD-10-CM | POA: Diagnosis not present

## 2017-07-17 ENCOUNTER — Encounter: Payer: Self-pay | Admitting: Cardiology

## 2017-07-18 DIAGNOSIS — M25612 Stiffness of left shoulder, not elsewhere classified: Secondary | ICD-10-CM | POA: Diagnosis not present

## 2017-07-18 DIAGNOSIS — M25512 Pain in left shoulder: Secondary | ICD-10-CM | POA: Diagnosis not present

## 2017-07-18 DIAGNOSIS — Z96612 Presence of left artificial shoulder joint: Secondary | ICD-10-CM | POA: Diagnosis not present

## 2017-07-23 ENCOUNTER — Encounter: Payer: Self-pay | Admitting: Cardiology

## 2017-07-23 ENCOUNTER — Ambulatory Visit (INDEPENDENT_AMBULATORY_CARE_PROVIDER_SITE_OTHER): Payer: Medicare Other | Admitting: Cardiology

## 2017-07-23 VITALS — BP 160/92 | HR 88 | Ht 69.0 in | Wt 307.8 lb

## 2017-07-23 DIAGNOSIS — G4733 Obstructive sleep apnea (adult) (pediatric): Secondary | ICD-10-CM

## 2017-07-23 DIAGNOSIS — I10 Essential (primary) hypertension: Secondary | ICD-10-CM | POA: Diagnosis not present

## 2017-07-23 NOTE — Progress Notes (Signed)
Cardiology Office Note:    Date:  07/23/2017   ID:  Mariah Kelley, DOB 1951-08-30, MRN [id number]  PCP:  Jonathon Jordan, MD  Cardiologist:  Fransico Him, MD   Referring MD: Jonathon Jordan, MD   Chief Complaint  Patient presents with  . Sleep Apnea  . Hypertension    History of Present Illness:    Mariah Kelley is a 66 y.o. female with a hx of CAD, HTN and DM who was referred for sleep study due to excessive daytime sleepiness with an Epworth sleepiness score of 19.  She was found to have moderate OSA with an AHI of 19/hr and severe oxygen desaturations as low as 62% c/w nocturnal hypoxemia.  There was some mixup with the insurance and they stated that she could not have a BIPAP before seeing a sleep specialist to see if she did not tolerate the CPAP.   She is here today for followup and is really struggling with her CPAP device which is set at 10cm H2O.  She feels like she cannot breathe out with the CPAP but breathes in fine.  She is now having am headaches, more daytime sleepiness, feels horrible during the day.  She tolerates the full face and the nasal mask which she uses interchangeably. She denies any significant mouth or nasal dryness.  She does not know if she snores.  She is very frustrated with the device and is very tearful in shear frustration over how she feels.  She is very worried that she will lose her job.    Past Medical History:  Diagnosis Date  . Arthritis   . Breast cancer of upper-outer quadrant of right female breast ([place]) 12/23/2014  . Bruises easily   . CAD S/P percutaneous coronary angioplasty 01/17/2014   takes Brilinta and Plavix daily  . Cataracts, bilateral   . Diabetes mellitus without complication ([place])   . History of bronchitis 7-28yrs ago  . History of tobacco abuse    40 pack-years  . Hyperlipidemia    takes Lipitor daily  . Hypertension    takes Metoprolol daily  . Joint pain   . Obesity   . ST elevation myocardial infarction  (STEMI) of inferolateral wall ([place]) 01/17/2014    Past Surgical History:  Procedure Laterality Date  . ABDOMINAL HYSTERECTOMY    . APPENDECTOMY    . BREAST REDUCTION SURGERY Left 01/27/2015  . BREAST REDUCTION SURGERY Left 01/27/2015   Procedure: MAMMARY REDUCTION  LEFT (BREAST);  Surgeon: Irene Limbo, MD;  Location: [place];  Service: Plastics;  Laterality: Left;  . CATARACT EXTRACTION W/PHACO Right 04/11/2016   Procedure: CATARACT EXTRACTION PHACO AND INTRAOCULAR LENS PLACEMENT (IOC);  Surgeon: Rutherford Guys, MD;  Location: [facility];  Service: Ophthalmology;  Laterality: Right;  CDE: 14.15  . CATARACT EXTRACTION W/PHACO Left 05/02/2016   Procedure: CATARACT EXTRACTION PHACO AND INTRAOCULAR LENS PLACEMENT (IOC);  Surgeon: Rutherford Guys, MD;  Location: [facility];  Service: Ophthalmology;  Laterality: Left;  CDE: 12.11  . CHOLECYSTECTOMY    . COLONOSCOPY    . LEFT HEART CATH Bilateral 01/17/2014   Procedure: LEFT HEART CATH;  Surgeon: Lorretta Harp, MD;  Location: [facility] CATH LAB;  Service: Cardiovascular;  Laterality: Bilateral;  . MASTECTOMY W/ SENTINEL NODE BIOPSY Right 01/27/2015   dr Lucia Gaskins   . MASTECTOMY W/ SENTINEL NODE BIOPSY Right 01/27/2015   Procedure: RIGHT MASTECTOMY WITH RIGHT AXILLARY  SENTINEL LYMPH NODE BIOPSY;  Surgeon: Alphonsa Overall, MD;  Location: [place];  Service:  General;  Laterality: Right;  . PERCUTANEOUS CORONARY STENT INTERVENTION (PCI-S)    . PORTACATH PLACEMENT Left 03/23/2015   Procedure: INSERTION PORT-A-CATH ;  Surgeon: Alphonsa Overall, MD;  Location: [facility];  Service: General;  Laterality: Left;  . SPINAL FUSION    . TOTAL SHOULDER ARTHROPLASTY Left 04/19/2017   Procedure: TOTAL SHOULDER ARTHROPLASTY;  Surgeon: Tania Ade, MD;  Location: [place];  Service: Orthopedics;  Laterality: Left;  Left total shoulder arthroplasty  . TUBAL LIGATION      Current Medications: Current Meds  Medication Sig  . albuterol (PROVENTIL HFA;VENTOLIN HFA) 108 (90 Base) MCG/ACT inhaler  Inhale 2 puffs into the lungs every 6 (six) hours as needed for wheezing or shortness of breath.  . anastrozole (ARIMIDEX) 1 MG tablet Take 1 tablet (1 mg total) by mouth daily.  [name] aspirin EC 81 MG tablet Take 81 mg by mouth daily.  [name] atorvastatin (LIPITOR) 80 MG tablet TAKE 1 TABLET (80 MG TOTAL) BY MOUTH DAILY AT 6 PM.  . budesonide-formoterol (SYMBICORT) 160-4.5 MCG/ACT inhaler Inhale 2 puffs into the lungs 2 (two) times daily.  . Cholecalciferol (VITAMIN D3) 2000 units TABS Take 2,000 Units by mouth daily.  . clopidogrel (PLAVIX) 75 MG tablet TAKE 1 TABLET BY MOUTH EVERY DAY  . metoprolol tartrate (LOPRESSOR) 50 MG tablet TAKE 1 TABLET (50 MG TOTAL) BY MOUTH 2 (TWO) TIMES DAILY.  . nitroGLYCERIN (NITROSTAT) 0.4 MG SL tablet PLACE 1 TABLET UNDER THE TONGUE EVERY 5 MINUTES AS NEEDED FOR CHEST PAIN.     Allergies:   Prednisone and Codeine   Social History   Social History  . Marital status: Single    Spouse name: N/A  . Number of children: N/A  . Years of education: N/A   Occupational History  . Social Worker    Social History Main Topics  . Smoking status: Former Smoker    Packs/day: 1.00    Years: 40.00    Types: Cigarettes    Quit date: 07/30/2009  . Smokeless tobacco: Former Systems developer     Comment: quit 5 yrs ago  . Alcohol use No  . Drug use: No  . Sexual activity: No   Other Topics Concern  . None   Social History Narrative   Divorced, 2 children.      Family History: The patient's family history includes CAD in her brother and brother; Heart attack (age of onset: 43) in her mother; Heart attack (age of onset: 13) in her father.  ROS:   Please see the history of present illness.     All other systems reviewed and are negative.  EKGs/Labs/Other Studies Reviewed:    The following studies were reviewed today: Sleep study/CPAP titration/CPAP download  EKG:  EKG is not ordered today.   Recent Labs: 04/11/2017: ALT 19 04/20/2017: BUN 14; Creatinine, Ser 0.91;  Hemoglobin 11.6; Platelets 274; Potassium 4.8; Sodium 135   Recent Lipid Panel    Component Value Date/Time   CHOL 231 (H) 01/18/2014 0314   TRIG 127 01/18/2014 0314   HDL 49 01/18/2014 0314   CHOLHDL 4.7 01/18/2014 0314   VLDL 25 01/18/2014 0314   LDLCALC 157 (H) 01/18/2014 0314    Physical Exam:    VS:  BP (!) 160/92   Pulse 88   Ht 5\' 9"  (1.753 m)   Wt (!) 307 lb 12.8 oz (139.6 kg)   BMI 45.45 kg/m     Wt Readings from Last 3 Encounters:  07/23/17 (!) 307 lb 12.8  oz (139.6 kg)  05/07/17 277 lb (125.6 kg)  04/19/17 277 lb (125.6 kg)     GEN:  Well nourished, well developed in no acute distress HEENT: Normal NECK: No JVD; No carotid bruits LYMPHATICS: No lymphadenopathy CARDIAC: RRR, no murmurs, rubs, gallops RESPIRATORY:  Clear to auscultation without rales, wheezing or rhonchi  ABDOMEN: Soft, non-tender, non-distended MUSCULOSKELETAL:  No edema; No deformity  SKIN: Warm and dry NEUROLOGIC:  Alert and oriented x 3 PSYCHIATRIC:  Normal affect   ASSESSMENT:    1. Obstructive sleep apnea   2. Essential hypertension   3. Morbid obesity ([place])    PLAN:    In order of problems listed above:  1.  OSA - the patient is tolerating PAP therapy well without any problems. The PAP download was reviewed today and showed an AHI of 2.1/hr on 10 cm H2O with 93% compliance in using more than 4 hours nightly.  The patient has been using and benefiting from CPAP use and will continue to benefit from therapy.   2.   HTN - her BP is borderline controlled on exam today.  At home it is usually 140-150/70-29mmHg.  She will continue on lopressor 50mg  BID.  3.  Morbid Obesity - she works with a Clinical research associate once weekly and rides a stationary bike for 30 minutes twice weekly.  She loves to work out but is limited right now with fatigue.     Medication Adjustments/Labs and Tests Ordered: Current medicines are reviewed at length with the patient today.  Concerns regarding medicines are  outlined above.  No orders of the defined types were placed in this encounter.  No orders of the defined types were placed in this encounter.   Signed, Fransico Him, MD  07/23/2017 1:00 PM    [place]

## 2017-07-23 NOTE — Patient Instructions (Signed)
Medication Instructions:  Your physician recommends that you continue on your current medications as directed. Please refer to the Current Medication list given to you today.   Labwork: None Ordered   Testing/Procedures: None Ordered   Follow-Up: Your physician recommends that you schedule a follow-up appointment in: 10 weeks with Dr. Radford Pax (ok to use a new patient slot per Dr. Radford Pax)   If you need a refill on your cardiac medications before your next appointment, please call your pharmacy.   Thank you for choosing [facility]! Christen Bame, RN [phone]

## 2017-07-24 DIAGNOSIS — M25512 Pain in left shoulder: Secondary | ICD-10-CM | POA: Diagnosis not present

## 2017-07-24 DIAGNOSIS — M25612 Stiffness of left shoulder, not elsewhere classified: Secondary | ICD-10-CM | POA: Diagnosis not present

## 2017-07-24 DIAGNOSIS — Z96612 Presence of left artificial shoulder joint: Secondary | ICD-10-CM | POA: Diagnosis not present

## 2017-07-26 DIAGNOSIS — Z96612 Presence of left artificial shoulder joint: Secondary | ICD-10-CM | POA: Diagnosis not present

## 2017-07-26 DIAGNOSIS — M25612 Stiffness of left shoulder, not elsewhere classified: Secondary | ICD-10-CM | POA: Diagnosis not present

## 2017-07-26 DIAGNOSIS — M25512 Pain in left shoulder: Secondary | ICD-10-CM | POA: Diagnosis not present

## 2017-07-27 DIAGNOSIS — Z23 Encounter for immunization: Secondary | ICD-10-CM | POA: Diagnosis not present

## 2017-07-27 DIAGNOSIS — F4323 Adjustment disorder with mixed anxiety and depressed mood: Secondary | ICD-10-CM | POA: Diagnosis not present

## 2017-07-30 DIAGNOSIS — Z96612 Presence of left artificial shoulder joint: Secondary | ICD-10-CM | POA: Diagnosis not present

## 2017-07-30 DIAGNOSIS — M25512 Pain in left shoulder: Secondary | ICD-10-CM | POA: Diagnosis not present

## 2017-07-30 DIAGNOSIS — M25612 Stiffness of left shoulder, not elsewhere classified: Secondary | ICD-10-CM | POA: Diagnosis not present

## 2017-07-30 NOTE — Telephone Encounter (Signed)
Mikael Spray, MD  Cc: [name], CMA; Darlina Guys        Hey Dr. Radford Pax.  I wanted to try and clarify what's going on with this patient.   The issue here is that this patient was already started on CPAP therapy on 7/19. This patient has MCR and they require that if therapy is not working, they need to come back in for an office visit discussing the issues then be sent back for a bipap titration. After that titration, bipap therapy can be initiated.   If this patient had failed CPAP in the lab and was moved to BIPAP all in the same titration, we could have started her out on BIPAP.   Let me know if you have questions.  Thanks  [employer]  [phone]

## 2017-07-30 NOTE — Telephone Encounter (Signed)
RE: Office visit  Sheran Lawless, Hermenia Fiscal, [place].  I see that this patient came in to see Dr. Radford Pax yesterday and was advised to continue current therapy as she is using and benefiting from CPAP therapy:   1. OSA - the patient is tolerating PAP therapy well without any problems. The PAP download was reviewed today and showed an AHI of 2.1/hr on 10 cm H2O with 93% compliance in using more than 4 hours nightly. The patient has been using and benefiting from CPAP use and will continue to benefit from therapy.   Just to clarify, this patient is still on CPAP as was not able to get BiPAP through insurance.    Thank you!  Melissa

## 2017-07-31 NOTE — Addendum Note (Signed)
Addended by: [name] on: 07/31/2017 05:07 PM   Modules accepted: Orders

## 2017-07-31 NOTE — Telephone Encounter (Signed)
Informed patient of upcoming CPAP Titration and patient understanding was verbalized. Patient understands her Titration study is scheduled for August 01 2017. Patient understands her Titration study will be done at [facility] sleep lab. Patient understands she will receive a sleep packet in a week or so. Patient understands to call if she does not receive the sleep packet in a timely manner. Patient agrees with treatment and thanked me for call.

## 2017-07-31 NOTE — Telephone Encounter (Signed)
From: [name], CMA  Sent: 07/30/2017  1:49 PM  To: Melissa Stenson  Subject: RE: Office visit                 Mariah Kelley, the patient is going back for a CPAP titration asap and then qualify for the Bipap.

## 2017-07-31 NOTE — Telephone Encounter (Signed)
RE: CPAP TITRATION  Sueanne Margarita, MD  [name], CMA        She already had a BiPAP study done on 8/23 so she should not need another study just get the BiPAP as ordered on that study   [name]   Previous Messages    ----- Message -----  From: [name], CMA  Sent: 07/30/2017  1:41 PM  To: Sueanne Margarita, MD  Subject: CPAP TITRATION                  Patient needs an order for a CPAP Titration order per [facility].  Thanks,  [name]

## 2017-08-01 ENCOUNTER — Ambulatory Visit (HOSPITAL_BASED_OUTPATIENT_CLINIC_OR_DEPARTMENT_OTHER): Payer: Medicare Other | Attending: Cardiology | Admitting: Cardiology

## 2017-08-01 VITALS — Ht 69.0 in | Wt 290.0 lb

## 2017-08-01 DIAGNOSIS — G4733 Obstructive sleep apnea (adult) (pediatric): Secondary | ICD-10-CM | POA: Diagnosis not present

## 2017-08-01 NOTE — Telephone Encounter (Signed)
  Swinyer, Lanice Schwab, RN  [name], CMA        Per Dr. Radford Pax,   Resmed BiPAP  Set BiPAP at 12/8 cm H2O  D/L in 2 weeks   Thanks,  [name]

## 2017-08-08 ENCOUNTER — Telehealth: Payer: Self-pay | Admitting: Cardiology

## 2017-08-08 NOTE — Telephone Encounter (Signed)
°  Follow Up   Calling to follow up on BiPAP machine order. Please call.

## 2017-08-09 DIAGNOSIS — H43392 Other vitreous opacities, left eye: Secondary | ICD-10-CM | POA: Diagnosis not present

## 2017-08-09 DIAGNOSIS — H43812 Vitreous degeneration, left eye: Secondary | ICD-10-CM | POA: Diagnosis not present

## 2017-08-09 DIAGNOSIS — Z961 Presence of intraocular lens: Secondary | ICD-10-CM | POA: Diagnosis not present

## 2017-08-10 NOTE — Telephone Encounter (Signed)
Called LMTCB. 08/10/17 3:44.

## 2017-08-14 ENCOUNTER — Encounter: Payer: Self-pay | Admitting: Cardiology

## 2017-08-15 NOTE — Telephone Encounter (Signed)
Hello Dr Radford Pax, the patient says she went back for the titration so she could get her BIPAP machine.    8/23 sleep study. [name] CLINICAL INFORMATION: The patient is referred for a BiPAP titration to treat sleep apnea.     I understand that you are saying she is stable on CPAP.      The patient says she needs a BIPAP machine. The patient is considering another provider because she is frustrated and tired of not sleeping for months. Thanks, [name]

## 2017-08-15 NOTE — Telephone Encounter (Signed)
She is already on CPAP and doing well - see OV note from last [place]   RE: Titration result Turner, Eber Hong, MD  [name], CMA        She does not need BIPAP - she is stable on CPAP   [name]

## 2017-08-17 ENCOUNTER — Ambulatory Visit: Payer: 59 | Admitting: Cardiology

## 2017-08-17 NOTE — Telephone Encounter (Signed)
Per Dr Jerry Caras, MD  [name], CMA        She does not need BIPAP - she is stable on CPAP   Mariah Kelley

## 2017-08-20 ENCOUNTER — Telehealth: Payer: Self-pay | Admitting: *Deleted

## 2017-08-20 DIAGNOSIS — G4733 Obstructive sleep apnea (adult) (pediatric): Secondary | ICD-10-CM

## 2017-08-20 NOTE — Telephone Encounter (Signed)
Dr Radford Pax, I have ordered the BiPAP and settings however,Per [facility] please interpret the CPAP Titration and sign off on it in EPIC. Patient's insurance requires a doctor's signature.  Patient sent this message.Marland KitchenMarland KitchenI completed titration study on October 3 and see that you have reviewed the report. However [facility] states they have not received your Impressions so they cannot get the bipap ordered. I have spoken to [facility] but still don't have an answer yet as to when this will be done.     Please advise, Thanks

## 2017-08-20 NOTE — Telephone Encounter (Signed)
----- Message from Sueanne Margarita, MD sent at 08/18/2017  4:21 PM EDT ----- Regarding: RE: Titration interpretation Please order Resmed BiPAP with auto settings with an IPAP of 18cm H2O and EPAP of 4cm H2O with a PS of 4cm H2O.  Get a download in 2 weeks  Mariah Him, MD ----- Message ----- From: [name], CMA Sent: 08/15/2017   1:25 PM To: Sueanne Margarita, MD Subject: RE: Titration interpretation                    Hello Dr Radford Pax, the patient says she went back for the titration so she could get her BIPAP machine.    8/23 sleep study. [name] CLINICAL INFORMATION: The patient is referred for a BiPAP titration to treat sleep apnea.     I understand that you are saying she is stable on CPAP.      The patient says she needs a BIPAP machine. The patient is considering another provider because she is frustrated and tired of not sleeping for months. Thanks, [name]                                                                                                                                                                                                                                                                                                                                                                                                                                                                                                                                                                                                                                             -----  Message ----- From: Sueanne Margarita, MD Sent: 08/14/2017   3:21 PM To: [name], CMA Subject: RE: Titration result                           She does not need BIPAP - she is stable on CPAP  [name]  ----- Message ----- From: [name], CMA Sent: 08/10/2017   3:46 PM To: Sueanne Margarita, MD Subject: RE: Titration result                           I got 2 messages:  1. Per patient-   Follow  Up  Calling to follow up on BiPAP machine order. Please call.    2.  07/31/2017 -Swinyer, Lanice Schwab, RN  [name], CMA   Per Dr. Radford Pax,   Resmed BiPAP  Set BiPAP at 12/8 cm H2O  D/L in 2 weeks   Thanks,  [name]        ----- Message ----- From: Sueanne Margarita, MD Sent: 08/10/2017   3:23 PM To: [name], CMA Subject: RE: Titration result                           She is already on CPAP and doing well - see OV note from last [place] ----- Message ----- From: [name], CMA Sent: 08/10/2017   9:42 AM To: Sueanne Margarita, MD Subject: Titration result                               When you get a chance [facility] is ready to set her up with her BIPAP.

## 2017-08-21 NOTE — Progress Notes (Signed)
Patient Name: Mariah Kelley, Mariah Kelley Date: 08/01/2017 Gender: Female D.O.B: 10/09/1951 Age (years): 19 Referring Provider: Fransico Him MD, ABSM Height (inches): 69 Interpreting Physician: Fransico Him MD, ABSM Weight (lbs): 290 RPSGT: Carolin Coy BMI: 45 MRN: [id number] Neck Size: 17.00  CLINICAL INFORMATION The patient is referred for a split night study with BPAP. Most recent polysomnogram dated 05/01/2017 revealed an AHI of 19.0/h and RDI of 19.0/h. Most recent titration study dated 06/28/2017 was optimal at 12.8cm H2O with an AHI of 1.9/h.  MEDICATIONS Medications self-administered by patient taken the night of the study : SYMBICORT, METOPROLOL, ATORVASTATIN  SLEEP STUDY TECHNIQUE As per the AASM Manual for the Scoring of Sleep and Associated Events v2.3 (April 2016) with a hypopnea requiring 4% desaturations.  The channels recorded and monitored were frontal, central and occipital EEG, electrooculogram (EOG), submentalis EMG (chin), nasal and oral airflow, thoracic and abdominal wall motion, anterior tibialis EMG, snore microphone, electrocardiogram, and pulse oximetry. Bi-level positive airway pressure (BiPAP) was initiated when the patient met split night criteria and was titrated according to treat sleep-disordered breathing.  RESPIRATORY PARAMETERS Diagnostic Total AHI (/hr): 26.8  RDI (/hr):38.8  OA Index (/hr): 0.9  CA Index (/hr): 2.6 REM AHI (/hr): 19.7  NREM AHI (/hr):29.2  Supine AHI (/hr):25.2  Non-supine AHI (/hr):39.19 Min O2 Sat (%):84.00  Mean O2 (%): 91.60  Time below 88% (min):27.4    Titration Optimal IPAP Pressure (cm):   Optimal EPAP Pressure (cm):  AHI at Optimal Pressure (/hr):N/A  Min O2 at Optimal Pressure (%):N/A Sleep % at Optimal (%):N/A  Supine % at Optimal (%):N/A      SLEEP ARCHITECTURE The study was initiated at 10:44:01 PM and terminated at 4:54:34 AM. The total recorded time was 370.6 minutes. EEG confirmed total  sleep time was 257.0 minutes yielding a sleep efficiency of 69.4%. Sleep onset after lights out was 0.8 minutes with a REM latency of 89.0 minutes. The patient spent 17.12% of the night in stage N1 sleep, 57.59% in stage N2 sleep, 0.39% in stage N3 and 24.90% in REM. Wake after sleep onset (WASO) was 112.7 minutes. The Arousal Index was 48.6/hour.  LEG MOVEMENT DATA The total Periodic Limb Movements of Sleep (PLMS) were 83. The PLMS index was 19.38 .  CARDIAC DATA The 2 lead EKG demonstrated sinus rhythm. The mean heart rate was 80.30 beats per minute. Other EKG findings include: PVCs.  IMPRESSIONS - Moderate obstructive sleep apnea occurred during the diagnostic portion of the study (AHI = 26.8 /hour). An optimal BPAP pressure could not be selected for this patient based on the available study data. - No significant central sleep apnea occurred during the diagnostic portion of the study (CAI = 2.6/hour). - The patient snored with moderate snoring volume during the diagnostic portion of the study. - EKG findings include PVCs. - Mild periodic limb movements of sleep occurred during the study.  DIAGNOSIS - Obstructive Sleep Apnea (327.23 [G47.33 ICD-10])  RECOMMENDATIONS - Recommend auto BIPAP from 4 to 18cm H2O with PS of 4cm H2O - Avoid alcohol, sedatives and other CNS depressants that may worsen sleep apnea and disrupt normal sleep architecture. - Sleep hygiene should be reviewed to assess factors that may improve sleep quality. - Weight management and regular exercise should be initiated or continued.  [place], American Board of Sleep Medicine  ELECTRONICALLY SIGNED ON:  08/21/2017, 9:09 PM [place] PH: [phone]   FX: [phone] [place]  SLEEP MEDICINE

## 2017-08-21 NOTE — Procedures (Signed)
Patient Name: Mariah Kelley, Mariah Kelley Date: 08/01/2017 Gender: Female D.O.B: Jan 04, 1951 Age (years): 32 Referring Provider: Fransico Him MD, ABSM Height (inches): 69 Interpreting Physician: Fransico Him MD, ABSM Weight (lbs): 290 RPSGT: Carolin Coy BMI: 70 MRN: [id number] Neck Size: 17.00  CLINICAL INFORMATION The patient is referred for a split night study with BPAP. Most recent polysomnogram dated 05/01/2017 revealed an AHI of 19.0/h and RDI of 19.0/h. Most recent titration study dated 06/28/2017 was optimal at 12.8cm H2O with an AHI of 1.9/h.  MEDICATIONS Medications self-administered by patient taken the night of the study : SYMBICORT, METOPROLOL, ATORVASTATIN  SLEEP STUDY TECHNIQUE As per the AASM Manual for the Scoring of Sleep and Associated Events v2.3 (April 2016) with a hypopnea requiring 4% desaturations.  The channels recorded and monitored were frontal, central and occipital EEG, electrooculogram (EOG), submentalis EMG (chin), nasal and oral airflow, thoracic and abdominal wall motion, anterior tibialis EMG, snore microphone, electrocardiogram, and pulse oximetry. Bi-level positive airway pressure (BiPAP) was initiated when the patient met split night criteria and was titrated according to treat sleep-disordered breathing.  RESPIRATORY PARAMETERS Diagnostic Total AHI (/hr): 26.8  RDI (/hr):38.8  OA Index (/hr): 0.9  CA Index (/hr): 2.6 REM AHI (/hr): 19.7  NREM AHI (/hr):29.2  Supine AHI (/hr):25.2  Non-supine AHI (/hr):39.19 Min O2 Sat (%):84.00  Mean O2 (%): 91.60  Time below 88% (min):27.4    Titration Optimal IPAP Pressure (cm):   Optimal EPAP Pressure (cm):  AHI at Optimal Pressure (/hr):N/A  Min O2 at Optimal Pressure (%):N/A Sleep % at Optimal (%):N/A  Supine % at Optimal (%):N/A      SLEEP ARCHITECTURE The study was initiated at 10:44:01 PM and terminated at 4:54:34 AM. The total recorded time was 370.6 minutes. EEG confirmed total sleep  time was 257.0 minutes yielding a sleep efficiency of 69.4%. Sleep onset after lights out was 0.8 minutes with a REM latency of 89.0 minutes. The patient spent 17.12% of the night in stage N1 sleep, 57.59% in stage N2 sleep, 0.39% in stage N3 and 24.90% in REM. Wake after sleep onset (WASO) was 112.7 minutes. The Arousal Index was 48.6/hour.  LEG MOVEMENT DATA The total Periodic Limb Movements of Sleep (PLMS) were 83. The PLMS index was 19.38 .  CARDIAC DATA The 2 lead EKG demonstrated sinus rhythm. The mean heart rate was 80.30 beats per minute. Other EKG findings include: PVCs.  IMPRESSIONS - Moderate obstructive sleep apnea occurred during the diagnostic portion of the study (AHI = 26.8 /hour). An optimal BPAP pressure could not be selected for this patient based on the available study data. - No significant central sleep apnea occurred during the diagnostic portion of the study (CAI = 2.6/hour). - The patient snored with moderate snoring volume during the diagnostic portion of the study. - EKG findings include PVCs. - Mild periodic limb movements of sleep occurred during the study.  DIAGNOSIS - Obstructive Sleep Apnea (327.23 [G47.33 ICD-10])  RECOMMENDATIONS - Recommend auto BIPAP from 4 to 18cm H2O with PS of 4cm H2O - Avoid alcohol, sedatives and other CNS depressants that may worsen sleep apnea and disrupt normal sleep architecture. - Sleep hygiene should be reviewed to assess factors that may improve sleep quality. - Weight management and regular exercise should be initiated or continued.  [place], American Board of Sleep Medicine  ELECTRONICALLY SIGNED ON:  08/21/2017, 9:09 PM [place] PH: [phone]   FX: [phone] [place]  SLEEP MEDICINE

## 2017-08-23 DIAGNOSIS — F4323 Adjustment disorder with mixed anxiety and depressed mood: Secondary | ICD-10-CM | POA: Diagnosis not present

## 2017-08-24 ENCOUNTER — Institutional Professional Consult (permissible substitution): Payer: Medicare Other | Admitting: Pulmonary Disease

## 2017-08-24 ENCOUNTER — Telehealth: Payer: Self-pay | Admitting: *Deleted

## 2017-08-24 NOTE — Telephone Encounter (Signed)
Per Barbaraann Rondo at [facility] we needed to have these settings and get a download in 2 weeks.  Patient notified and agrees to treatment.   RE: BIPAP  Turner, Eber Hong, MD  [name], CMA        IPAP max 18cm H2O and EPAP min 5cm H2O with PS 4cm H2O  Fransico Him

## 2017-08-24 NOTE — Telephone Encounter (Signed)
-----   Message from Sueanne Margarita, MD sent at 08/21/2017  9:25 PM EDT ----- Please let patient know that we are going to try auto BIPAP and please get a download in 2 weeks

## 2017-08-24 NOTE — Addendum Note (Signed)
Addended by: [name] on: 08/24/2017 03:27 PM   Modules accepted: Orders

## 2017-08-24 NOTE — Telephone Encounter (Addendum)
RE: BIPAP  Turner, Eber Hong, MD  [name], CMA        IPAP max 18cm H2O and EPAP min 5cm H2O with PS 4cm H2O  Fransico Him

## 2017-08-24 NOTE — Telephone Encounter (Signed)
BIPAP  [name], CMA  Sueanne Margarita, MD        Hello Dr Radford Pax,  [facility] needs a  Max IPAP setting of ? For BIPAP  MIN EPAP setting of ? For BIPAP  PS of ?              Thanks,  [name]

## 2017-08-24 NOTE — Addendum Note (Signed)
Addended by: [name] on: 08/24/2017 03:36 PM   Modules accepted: Orders

## 2017-09-18 ENCOUNTER — Encounter: Payer: Self-pay | Admitting: Cardiology

## 2017-09-21 DIAGNOSIS — F4323 Adjustment disorder with mixed anxiety and depressed mood: Secondary | ICD-10-CM | POA: Diagnosis not present

## 2017-09-24 ENCOUNTER — Ambulatory Visit (INDEPENDENT_AMBULATORY_CARE_PROVIDER_SITE_OTHER): Payer: Medicare Other | Admitting: Cardiology

## 2017-09-24 ENCOUNTER — Encounter: Payer: Self-pay | Admitting: Cardiology

## 2017-09-24 VITALS — BP 132/74 | HR 79 | Ht 69.0 in | Wt 313.6 lb

## 2017-09-24 DIAGNOSIS — G4733 Obstructive sleep apnea (adult) (pediatric): Secondary | ICD-10-CM | POA: Diagnosis not present

## 2017-09-24 DIAGNOSIS — I10 Essential (primary) hypertension: Secondary | ICD-10-CM

## 2017-09-24 NOTE — Patient Instructions (Signed)

## 2017-09-24 NOTE — Progress Notes (Signed)
Cardiology Office Note:    Date:  09/24/2017   ID:  Mariah Kelley, DOB Feb 17, 1951, MRN [id number]  PCP:  Jonathon Jordan, MD  Cardiologist:  Fransico Him, MD   Referring MD: Jonathon Jordan, MD   Chief Complaint  Patient presents with  . Follow-up    OSA, HTN    History of Present Illness:    Mariah Kelley is a 66 y.o. female with a hx of moderate OSA with an AHI of 19/hr with severe oxygen desaturations as low as 62%.  She underwent PAP titration and is now on  Auto BiPAP.  She is doing well with her PAP device and thinks that she has gotten used to it.  She tolerates the full face mask and a nasal pillow and feels the pressure is adequate.  Since going on CPAP she feels rested in the am and has no significant daytime sleepiness.  She denies any significant mouth or nasal dryness or nasal congestion.  She does not think that he snores.  She has not had any HAs unless she pulls the mask off during the night in her sleep.  She is not exercising due to DOE and is seeing pulmonary next week.   Past Medical History:  Diagnosis Date  . Arthritis   . Breast cancer of upper-outer quadrant of right female breast ([place]) 12/23/2014  . Bruises easily   . CAD S/P percutaneous coronary angioplasty 01/17/2014   takes Brilinta and Plavix daily  . Cataracts, bilateral   . Diabetes mellitus without complication ([place])   . History of bronchitis 7-67yrs ago  . History of tobacco abuse    40 pack-years  . Hyperlipidemia    takes Lipitor daily  . Hypertension    takes Metoprolol daily  . Joint pain   . Obesity   . OSA treated with BiPAP    moderate with AHI 19/hr now on BIPAP auto  . ST elevation myocardial infarction (STEMI) of inferolateral wall ([place]) 01/17/2014    Past Surgical History:  Procedure Laterality Date  . ABDOMINAL HYSTERECTOMY    . APPENDECTOMY    . BREAST REDUCTION SURGERY Left 01/27/2015  . CATARACT EXTRACTION PHACO AND INTRAOCULAR LENS PLACEMENT (IOC) Left  05/02/2016   Performed by Rutherford Guys, MD at [facility]  . CATARACT EXTRACTION PHACO AND INTRAOCULAR LENS PLACEMENT (IOC) Right 04/11/2016   Performed by Rutherford Guys, MD at [facility]  . CHOLECYSTECTOMY    . COLONOSCOPY    . INSERTION PORT-A-CATH Left 03/23/2015   Performed by Alphonsa Overall, MD at [facility]  . LEFT HEART CATH Bilateral 01/17/2014   Performed by Lorretta Harp, MD at [facility] C[facility]  . MAMMARY REDUCTION  LEFT (BREAST) Left 01/27/2015   Performed by Irene Limbo, MD at [place]  . MASTECTOMY W/ SENTINEL NODE BIOPSY Right 01/27/2015   dr Lucia Gaskins   . PERCUTANEOUS CORONARY STENT INTERVENTION (PCI-S)    . RIGHT MASTECTOMY WITH RIGHT AXILLARY  SENTINEL LYMPH NODE BIOPSY Right 01/27/2015   Performed by Alphonsa Overall, MD at [place]  . SPINAL FUSION    . TOTAL SHOULDER ARTHROPLASTY Left 04/19/2017   Performed by Tania Ade, MD at [place]  . TUBAL LIGATION      Current Medications: Current Meds  Medication Sig  . albuterol (PROVENTIL HFA;VENTOLIN HFA) 108 (90 Base) MCG/ACT inhaler Inhale 2 puffs into the lungs every 6 (six) hours as needed for wheezing or shortness of breath.  . anastrozole (ARIMIDEX) 1 MG tablet  Take 1 tablet (1 mg total) by mouth daily.  [name] aspirin EC 81 MG tablet Take 81 mg by mouth daily.  [name] atorvastatin (LIPITOR) 80 MG tablet TAKE 1 TABLET (80 MG TOTAL) BY MOUTH DAILY AT 6 PM.  . budesonide-formoterol (SYMBICORT) 160-4.5 MCG/ACT inhaler Inhale 2 puffs into the lungs 2 (two) times daily.  . Cholecalciferol (VITAMIN D3) 2000 units TABS Take 2,000 Units by mouth daily.  . clopidogrel (PLAVIX) 75 MG tablet TAKE 1 TABLET BY MOUTH EVERY DAY  . metoprolol tartrate (LOPRESSOR) 50 MG tablet TAKE 1 TABLET (50 MG TOTAL) BY MOUTH 2 (TWO) TIMES DAILY.  . nitroGLYCERIN (NITROSTAT) 0.4 MG SL tablet PLACE 1 TABLET UNDER THE TONGUE EVERY 5 MINUTES AS NEEDED FOR CHEST PAIN.     Allergies:   Prednisone and Codeine   Social History   Socioeconomic History  . Marital status: Single     Spouse name: None  . Number of children: None  . Years of education: None  . Highest education level: None  Social Needs  . Financial resource strain: None  . Food insecurity - worry: None  . Food insecurity - inability: None  . Transportation needs - medical: None  . Transportation needs - non-medical: None  Occupational History  . Occupation: Education officer, museum  Tobacco Use  . Smoking status: Former Smoker    Packs/day: 1.00    Years: 40.00    Pack years: 40.00    Types: Cigarettes    Last attempt to quit: 07/30/2009    Years since quitting: 8.1  . Smokeless tobacco: Former Systems developer  . Tobacco comment: quit 5 yrs ago  Substance and Sexual Activity  . Alcohol use: No  . Drug use: No  . Sexual activity: No    Birth control/protection: Surgical  Other Topics Concern  . None  Social History Narrative   Divorced, 2 children.      Family History: The patient's family history includes CAD in her brother and brother; Heart attack (age of onset: 31) in her mother; Heart attack (age of onset: 10) in her father.  ROS:   Please see the history of present illness.    ROS  All other systems reviewed and negative.   EKGs/Labs/Other Studies Reviewed:    The following studies were reviewed today: PAP download  EKG:  EKG is not ordered today.   Recent Labs: 04/11/2017: ALT 19 04/20/2017: BUN 14; Creatinine, Ser 0.91; Hemoglobin 11.6; Platelets 274; Potassium 4.8; Sodium 135   Recent Lipid Panel    Component Value Date/Time   CHOL 231 (H) 01/18/2014 0314   TRIG 127 01/18/2014 0314   HDL 49 01/18/2014 0314   CHOLHDL 4.7 01/18/2014 0314   VLDL 25 01/18/2014 0314   LDLCALC 157 (H) 01/18/2014 0314    Physical Exam:    VS:  BP 132/74   Pulse 79   Ht 5\' 9"  (1.753 m)   Wt (!) 313 lb 9.6 oz (142.2 kg)   SpO2 95%   BMI 46.31 kg/m     Wt Readings from Last 3 Encounters:  09/24/17 (!) 313 lb 9.6 oz (142.2 kg)  08/01/17 290 lb (131.5 kg)  07/23/17 (!) 307 lb 12.8 oz (139.6 kg)       GEN:  Well nourished, well developed in no acute distress HEENT: Normal NECK: No JVD; No carotid bruits LYMPHATICS: No lymphadenopathy CARDIAC: RRR, no murmurs, rubs, gallops RESPIRATORY:  Clear to auscultation without rales, wheezing or rhonchi  ABDOMEN: Soft, non-tender, non-distended MUSCULOSKELETAL:  No edema; No deformity  SKIN: Warm and dry NEUROLOGIC:  Alert and oriented x 3 PSYCHIATRIC:  Normal affect   ASSESSMENT:    1. Obstructive sleep apnea   2. Essential hypertension   3. Morbid obesity ([place])   4. OSA treated with BiPAP    PLAN:    In order of problems listed above:  1.  OSA - the patient is tolerating PAP therapy well without any problems. The PAP download was reviewed today and showed an AHI of 2.6/hr on auto BIPAP with 63% compliance in using more than 4 hours nightly.  The patient has been using and benefiting from CPAP use and will continue to benefit from therapy. Her compliance fell off due to the hurricaine and she had several days that she had no electricity.   2.  HTN - BP is well controlled on exam today.  She will continue on Lopressor 50mg  BID.    3.  Morbid obesity - I have encouraged her to get into a routine exercise program and cut back on carbs and portions.    Medication Adjustments/Labs and Tests Ordered: Current medicines are reviewed at length with the patient today.  Concerns regarding medicines are outlined above.  No orders of the defined types were placed in this encounter.  No orders of the defined types were placed in this encounter.   Signed, Fransico Him, MD  09/24/2017 11:12 AM    [place]

## 2017-09-24 NOTE — Telephone Encounter (Signed)
Reached out to patient to make her a 10 week follow up appointment per her insurance compliance. Patient informed me she had already been seen by the sleep doctor and was referred to Dr Halford Chessman At [facility] pulmonary.

## 2017-10-01 ENCOUNTER — Ambulatory Visit (INDEPENDENT_AMBULATORY_CARE_PROVIDER_SITE_OTHER): Payer: Medicare Other | Admitting: Pulmonary Disease

## 2017-10-01 ENCOUNTER — Encounter: Payer: Self-pay | Admitting: Pulmonary Disease

## 2017-10-01 VITALS — BP 120/78 | HR 81 | Ht 69.0 in | Wt 313.0 lb

## 2017-10-01 DIAGNOSIS — G4733 Obstructive sleep apnea (adult) (pediatric): Secondary | ICD-10-CM

## 2017-10-01 DIAGNOSIS — J432 Centrilobular emphysema: Secondary | ICD-10-CM | POA: Diagnosis not present

## 2017-10-01 DIAGNOSIS — R0609 Other forms of dyspnea: Secondary | ICD-10-CM

## 2017-10-01 DIAGNOSIS — Z6841 Body Mass Index (BMI) 40.0 and over, adult: Secondary | ICD-10-CM

## 2017-10-01 NOTE — Progress Notes (Signed)
   Subjective:    Patient ID: DIVA LEMBERGER, female    DOB: [birth year], 66 y.o.   MRN: [id number]  HPI    Review of Systems  Constitutional: Positive for unexpected weight change. Negative for fever.  HENT: Positive for dental problem, sinus pressure and sneezing. Negative for congestion, ear pain, nosebleeds, postnasal drip, rhinorrhea, sore throat and trouble swallowing.   Eyes: Positive for itching. Negative for redness.  Respiratory: Positive for cough, chest tightness and shortness of breath. Negative for wheezing.   Cardiovascular: Positive for leg swelling. Negative for palpitations.  Gastrointestinal: Negative for nausea and vomiting.  Genitourinary: Negative for dysuria.  Musculoskeletal: Positive for joint swelling.  Skin: Negative for rash.  Allergic/Immunologic: Negative.  Negative for environmental allergies, food allergies and immunocompromised state.  Neurological: Positive for headaches.  Hematological: Bruises/bleeds easily.  Psychiatric/Behavioral: Negative for dysphoric mood. The patient is not nervous/anxious.        Objective:   Physical Exam        Assessment & Plan:

## 2017-10-01 NOTE — Progress Notes (Signed)
Past surgical history She  has a past surgical history that includes Abdominal hysterectomy; Spinal fusion; Appendectomy; Percutaneous coronary stent intervention (pci-s); left heart cath (Bilateral, 01/17/2014); Cholecystectomy; Tubal ligation; Colonoscopy; Mastectomy w/ sentinel node biopsy (Right, 01/27/2015); Breast reduction surgery (Left, 01/27/2015); Mastectomy w/ sentinel node biopsy (Right, 01/27/2015); Breast reduction surgery (Left, 01/27/2015); Portacath placement (Left, 03/23/2015); Cataract extraction w/PHACO (Right, 04/11/2016); Cataract extraction w/PHACO (Left, 05/02/2016); and Total shoulder arthroplasty (Left, 04/19/2017).  Family history Her family history includes CAD in her brother and brother; Heart attack (age of onset: 63) in her mother; Heart attack (age of onset: 43) in her father.  Social history She  reports that she quit smoking about 8 years ago. Her smoking use included cigarettes. She has a 40.00 pack-year smoking history. She has quit using smokeless tobacco. She reports that she does not drink alcohol or use drugs.   Review of Systems  Constitutional: Positive for unexpected weight change. Negative for fever.  HENT: Positive for dental problem, sinus pressure and sneezing. Negative for congestion, ear pain, nosebleeds, postnasal drip, rhinorrhea, sore throat and trouble swallowing.   Eyes: Positive for itching. Negative for redness.  Respiratory: Positive for cough, chest tightness and shortness of breath. Negative for wheezing.   Cardiovascular: Positive for leg swelling. Negative for palpitations.  Gastrointestinal: Negative for nausea and vomiting.  Genitourinary: Negative for dysuria.  Musculoskeletal: Positive for joint swelling.  Skin: Negative for rash.  Allergic/Immunologic: Negative.  Negative for environmental allergies, food allergies and immunocompromised state.  Neurological: Positive for headaches.  Hematological: Bruises/bleeds easily.   Psychiatric/Behavioral: Negative for dysphoric mood. The patient is not nervous/anxious.    Allergies  Allergen Reactions  . Prednisone Other (See Comments)    "made poison ivy worse", rebound effect when take prednisone  . Codeine Nausea Only and Other (See Comments)    HEADACHE    Current Outpatient Medications on File Prior to Visit  Medication Sig  . albuterol (PROVENTIL HFA;VENTOLIN HFA) 108 (90 Base) MCG/ACT inhaler Inhale 2 puffs into the lungs every 6 (six) hours as needed for wheezing or shortness of breath.  . anastrozole (ARIMIDEX) 1 MG tablet Take 1 tablet (1 mg total) by mouth daily.  [name] aspirin EC 81 MG tablet Take 81 mg by mouth daily.  [name] atorvastatin (LIPITOR) 80 MG tablet TAKE 1 TABLET (80 MG TOTAL) BY MOUTH DAILY AT 6 PM.  . budesonide-formoterol (SYMBICORT) 160-4.5 MCG/ACT inhaler Inhale 2 puffs into the lungs 2 (two) times daily.  . Cholecalciferol (VITAMIN D3) 2000 units TABS Take 2,000 Units by mouth daily.  . clopidogrel (PLAVIX) 75 MG tablet TAKE 1 TABLET BY MOUTH EVERY DAY  . metoprolol tartrate (LOPRESSOR) 50 MG tablet TAKE 1 TABLET (50 MG TOTAL) BY MOUTH 2 (TWO) TIMES DAILY.  . nitroGLYCERIN (NITROSTAT) 0.4 MG SL tablet PLACE 1 TABLET UNDER THE TONGUE EVERY 5 MINUTES AS NEEDED FOR CHEST PAIN.   No current facility-administered medications on file prior to visit.     Chief Complaint  Patient presents with  . pulm consult    self referral. Pt diagnosed of COPD in Jan 2018, spiro showed asthma and mod copd. Pt breathing has consistantly worsen, have horseness, SOB rest and exertion. Pt has OSA, on new BIPAP in last 3 weeks with DME [facility]. Pt has prod cough- clear/yellow mucus.     Pulmonary tests Methacholine challenge 11/24/16 >> FEV1 1.91 (66%), FEV1% 79, + methacholine challenge CT chest lung cancer screen 12/13/16 >> emphysema, atherosclerosis  Sleep tests PSG 05/01/17 >>  AHI 19, SpO2 low 62% Bipap titration 06/28/17 >> failed CPAP; Bipap 12/8 cm H2O PSG  08/01/17 > AHI 26.8; auto Bipap Auto Bipap 08/31/17 to 09/30/17 >> used on 31 of 31 nights with average 7 hrs 40 min.  Average AHI 2.3 with median Bipap 11/7 and 95 th percentile Bipap 13/9 cm H2O  Past medical history She  has a past medical history of Arthritis, Breast cancer of upper-outer quadrant of right female breast ([place]) (12/23/2014), Bruises easily, CAD S/P percutaneous coronary angioplasty (01/17/2014), Cataracts, bilateral, Diabetes mellitus without complication ([place]), History of bronchitis (7-50yrs ago), History of tobacco abuse, Hyperlipidemia, Hypertension, Joint pain, Obesity, OSA treated with BiPAP, and ST elevation myocardial infarction (STEMI) of inferolateral wall ([place]) (01/17/2014).  Vital signs BP 120/78 (BP Location: Left Arm, Cuff Size: Normal)   Pulse 81   Ht 5\' 9"  (1.753 m)   Wt (!) 313 lb (142 kg)   SpO2 94%   BMI 46.22 kg/m   History of present illness Mariah Kelley is a 66 y.o. female former smoker with dyspnea, COPD with emphysema, and sleep apnea.  She has noticed problems with her breathing for a while.  She gets winded with minimal activity.  She tries to exercise several days per week.  She can do weight training, but has no stamina for cardio workouts.    She gets a dry cough.  She will also get tightness and discomfort in her chest.  She is not having wheeze, or sputum.  She had an episode of bronchitis last December.  She had a methacholine challenge that was positive.  She smoked 1 pack per day for 40 years.  She quit several years ago.  She had low dose CT chest for lung cancer screening.  This showed changes of emphysema.  She as been using symbicort and albuterol.  These help, but she still has trouble breathing with activity.  She recovers after resting.  She gets ankle swelling toward the end of the day, and this gets better if she puts her legs up  She is not having fever, hemoptysis, skin rash, or joint swelling.  She is from [place], but has  lived in [place] for years.  No history of allergies, or pneumonia.  No hx of exposure to tuberculosis.  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  No history of thromboembolic disease.  She had a sleep study in June.  This showed moderate sleep apnea.  She was tried on CPAP during titration study, but failed to be controlled with this.  She was then transitioned to Bipap.  She uses this nightly.  She doesn't have any issues with mask fit.  She feels using Bipap has improved her daytime alertness.  Prior to using Bipap she was snoring, and waking up frequently to use the bathroom.  She would also get very sleepy during the day.  She has a history of CAD and had stenting done to her RCA in March 2015.  She is followed by Dr. Gwenlyn Found.   Physical exam  General - No distress Eyes - pupils reactive ENT - No sinus tenderness, no oral exudate, no LAN, no thyromegaly, TM clear, pupils equal/reactive Cardiac - s1s2 regular, no murmur, pulses symmetric Chest - No wheeze/rales/dullness, good air entry, normal respiratory excursion Back - No focal tenderness Abd - Soft, non-tender, no organomegaly, + bowel sounds Ext - No edema Neuro - Normal strength, cranial nerves intact Skin -  No rashes Psych - Normal mood, and behavior   CMP Latest Ref Rng & Units 04/20/2017 04/11/2017 10/19/2016  Glucose 65 - 99 mg/dL 156(H) 176(H) 124(H)  BUN 6 - 20 mg/dL 14 16 11   Creatinine 0.44 - 1.00 mg/dL 0.91 0.87 0.86  Sodium 135 - 145 mmol/L 135 139 142  Potassium 3.5 - 5.1 mmol/L 4.8 3.8 3.8  Chloride 101 - 111 mmol/L 98(L) 104 100(L)  CO2 22 - 32 mmol/L 28 26 32  Calcium 8.9 - 10.3 mg/dL 9.1 9.6 9.6  Total Protein 6.5 - 8.1 g/dL - 6.6 6.8  Total Bilirubin 0.3 - 1.2 mg/dL - 0.8 0.5  Alkaline Phos 38 - 126 U/L - 97 101  AST 15 - 41 U/L - 20 17  ALT 14 - 54 U/L - 19 25     CBC Latest Ref Rng & Units 04/20/2017 04/11/2017 10/19/2016  WBC 4.0 - 10.5 K/uL 10.8(H) 7.4 12.6(H)   Hemoglobin 12.0 - 15.0 g/dL 11.6(L) 12.4 11.8(L)  Hematocrit 36.0 - 46.0 % 37.5 38.8 37.5  Platelets 150 - 400 K/uL 274 284 400     Ambulatory oximetry on room air 10/01/17 >> normal  Discussion She has dyspnea on exertion.  She has extensive history of smoking and recent CT chest showed changes of emphysema.  She had previous methacholine challenge that was positive.  She has history of CAD.  She is also obese, and likely has a component of deconditioning.    She also has a history of sleep apnea.  She was tried on CPAP, but failed to improve.  She has been using Bipap.  She would like to switch care for her sleep apnea to be managed by pulmonary medicine.   Assessment/plan  COPD with emphysema and asthma. - will get full PFTs - will try her on trelegy in place of symbicort - continue prn albuterol  Obstructive sleep apnea. - she was tried on CPAP and failed to improve - continue auto Bipap qhs - she is compliant with Bipap and reports benefit - will arrange for overnight oximetry with her using Bipap  History of tobacco abuse. - she should be getting set up for repeat low dose CT chest through lung cancer screening program in February 2019  History of CAD. - if her breathing symptoms do not improve with adjustment to her pulmonary regimen, then she might need to f/u with Dr. Gwenlyn Found to have further assessment of her cardiac function  Obesity with deconditioning. - continue her exercise and weight loss regimen   Patient Instructions  Trelegy one puff daily   Don't use symbicort while using trelegy  Continue albuterol two puffs every 4 to 6 hours as needed for cough, wheeze, or shortness of breath  Will arrange for overnight oxygen test with you using Bipap  Will arrange for pulmonary function test  Follow up in 4 to 6 weeks with Dr. Halford Chessman or Nurse Practitioner    Chesley Mires, MD [facility] Pager:  [phone] 10/01/2017, 12:16  PM

## 2017-10-01 NOTE — Patient Instructions (Signed)
Trelegy one puff daily   Don't use symbicort while using trelegy  Continue albuterol two puffs every 4 to 6 hours as needed for cough, wheeze, or shortness of breath  Will arrange for overnight oxygen test with you using Bipap  Will arrange for pulmonary function test  Follow up in 4 to 6 weeks with Dr. Halford Chessman or Nurse Practitioner

## 2017-10-02 ENCOUNTER — Encounter: Payer: Self-pay | Admitting: Pulmonary Disease

## 2017-10-02 DIAGNOSIS — J449 Chronic obstructive pulmonary disease, unspecified: Secondary | ICD-10-CM | POA: Diagnosis not present

## 2017-10-02 DIAGNOSIS — R0902 Hypoxemia: Secondary | ICD-10-CM | POA: Diagnosis not present

## 2017-10-04 ENCOUNTER — Ambulatory Visit (INDEPENDENT_AMBULATORY_CARE_PROVIDER_SITE_OTHER): Payer: Medicare Other | Admitting: Pulmonary Disease

## 2017-10-04 DIAGNOSIS — J432 Centrilobular emphysema: Secondary | ICD-10-CM | POA: Diagnosis not present

## 2017-10-04 LAB — PULMONARY FUNCTION TEST
DL/VA % PRED: 107 %
DL/VA: 5.67 ml/min/mmHg/L
DLCO COR: 25.63 ml/min/mmHg
DLCO cor % pred: 84 %
DLCO unc % pred: 80 %
DLCO unc: 24.55 ml/min/mmHg
FEF 25-75 Post: 2.08 L/sec
FEF 25-75 Pre: 1.47 L/sec
FEF2575-%Change-Post: 41 %
FEF2575-%PRED-PRE: 64 %
FEF2575-%Pred-Post: 90 %
FEV1-%Change-Post: 4 %
FEV1-%PRED-PRE: 66 %
FEV1-%Pred-Post: 69 %
FEV1-POST: 1.94 L
FEV1-PRE: 1.84 L
FEV1FVC-%Change-Post: 4 %
FEV1FVC-%Pred-Pre: 101 %
FEV6-%Change-Post: 0 %
FEV6-%PRED-PRE: 67 %
FEV6-%Pred-Post: 67 %
FEV6-POST: 2.36 L
FEV6-PRE: 2.35 L
FEV6FVC-%Change-Post: 0 %
FEV6FVC-%PRED-POST: 104 %
FEV6FVC-%PRED-PRE: 103 %
FVC-%CHANGE-POST: 0 %
FVC-%PRED-POST: 64 %
FVC-%PRED-PRE: 64 %
FVC-POST: 2.36 L
FVC-PRE: 2.36 L
POST FEV6/FVC RATIO: 100 %
PRE FEV1/FVC RATIO: 78 %
Post FEV1/FVC ratio: 82 %
Pre FEV6/FVC Ratio: 100 %
RV % PRED: 82 %
RV: 1.95 L
TLC % PRED: 81 %
TLC: 4.66 L

## 2017-10-04 NOTE — Progress Notes (Signed)
PFT completed today 10/04/17.  

## 2017-10-19 DIAGNOSIS — Z96612 Presence of left artificial shoulder joint: Secondary | ICD-10-CM | POA: Diagnosis not present

## 2017-10-19 DIAGNOSIS — F4323 Adjustment disorder with mixed anxiety and depressed mood: Secondary | ICD-10-CM | POA: Diagnosis not present

## 2017-10-19 DIAGNOSIS — Z471 Aftercare following joint replacement surgery: Secondary | ICD-10-CM | POA: Diagnosis not present

## 2017-10-19 DIAGNOSIS — M25512 Pain in left shoulder: Secondary | ICD-10-CM | POA: Diagnosis not present

## 2017-10-23 ENCOUNTER — Telehealth: Payer: Self-pay | Admitting: Pulmonary Disease

## 2017-10-23 NOTE — Telephone Encounter (Signed)
She can stop trelegy and resume symbicort 160/4.5 two puffs bid.

## 2017-10-23 NOTE — Telephone Encounter (Signed)
Spoke with pt, she states she has had a severe cough when she started Trelegy but it got worse after 4 days of using it. She states she coughs when she starts talking but does feel she is less SOB. She does not have any other symptoms with the cough, denies fever, congestion, or cold symptoms. VS please advise.

## 2017-10-24 NOTE — Telephone Encounter (Signed)
Spoke with pt. She is aware of VS's response. States that she has Symbicort on hand and does not need a prescription at this time. Nothing further was needed.

## 2017-11-08 ENCOUNTER — Ambulatory Visit: Payer: Medicare Other | Admitting: Acute Care

## 2017-11-12 DIAGNOSIS — Z961 Presence of intraocular lens: Secondary | ICD-10-CM | POA: Diagnosis not present

## 2017-11-12 DIAGNOSIS — E119 Type 2 diabetes mellitus without complications: Secondary | ICD-10-CM | POA: Diagnosis not present

## 2017-11-13 ENCOUNTER — Telehealth: Payer: Self-pay | Admitting: Pulmonary Disease

## 2017-11-13 NOTE — Telephone Encounter (Signed)
ONO with Bipap 10/03/17 >> test time 7 hrs 1 min.  Average SpO2 90%, low SpO2 83%.  Spent 58 min with SpO2 < 88%.  She has appointment on 11/14/17.  Will need to discuss setting up in lab Bipap titration study to determine if she needs supplemental oxygen with Bipap.

## 2017-11-14 ENCOUNTER — Telehealth: Payer: Self-pay

## 2017-11-14 ENCOUNTER — Encounter: Payer: Self-pay | Admitting: Acute Care

## 2017-11-14 ENCOUNTER — Ambulatory Visit (INDEPENDENT_AMBULATORY_CARE_PROVIDER_SITE_OTHER): Payer: Medicare Other | Admitting: Acute Care

## 2017-11-14 VITALS — BP 144/78 | HR 86 | Ht 69.0 in | Wt 304.6 lb

## 2017-11-14 DIAGNOSIS — G4733 Obstructive sleep apnea (adult) (pediatric): Secondary | ICD-10-CM | POA: Diagnosis not present

## 2017-11-14 DIAGNOSIS — Z9861 Coronary angioplasty status: Secondary | ICD-10-CM | POA: Diagnosis not present

## 2017-11-14 DIAGNOSIS — Z122 Encounter for screening for malignant neoplasm of respiratory organs: Secondary | ICD-10-CM | POA: Diagnosis not present

## 2017-11-14 DIAGNOSIS — Z87891 Personal history of nicotine dependence: Secondary | ICD-10-CM | POA: Diagnosis not present

## 2017-11-14 DIAGNOSIS — I2510 Atherosclerotic heart disease of native coronary artery without angina pectoris: Secondary | ICD-10-CM | POA: Diagnosis not present

## 2017-11-14 DIAGNOSIS — J449 Chronic obstructive pulmonary disease, unspecified: Secondary | ICD-10-CM | POA: Diagnosis not present

## 2017-11-14 MED ORDER — AEROCHAMBER MV MISC: 0 refills | Status: DC

## 2017-11-14 NOTE — Assessment & Plan Note (Addendum)
Compliant with therapy AHI is 2.0 100% compliance Wears x 7+ hours every night O&O indicates need for supplemental oxygen ONO with Bipap 10/03/17 >> test time 7 hrs 1 min.  Average SpO2 90%, low SpO2 83%.  Spent 58 min with SpO2 < 88%. Plan: We will schedule you for a Lab BiPAP Titration Study to assess need for supplemental oxygen.with BiPAP.  We will call you with results. Continue on BiPAP at bedtime. You appear to be benefiting from the treatment Goal is to wear for at least 6 hours each night for maximal clinical benefit. Continue to work on weight loss, as the link between excess weight  and sleep apnea is well established.  Do not drive if sleepy. Remember to clean mask, tubing, filter, and reservoir once weekly with soapy water.  Follow up with Dr. Halford Chessman  In 3 months. Please contact office for sooner follow up if symptoms do not improve or worsen or seek emergency care

## 2017-11-14 NOTE — Telephone Encounter (Signed)
Mariah Kelley wanted me to send a message letting you know that [facility] scheduled a low dose CT for pt. She is already in the program. FYI.

## 2017-11-14 NOTE — Assessment & Plan Note (Signed)
Has lost 18 pounds over Christmas Is working out with a Games developer to work on deconditioning

## 2017-11-14 NOTE — Assessment & Plan Note (Signed)
Schedule for 12/2017 LDCT through the lung cancer screening program

## 2017-11-14 NOTE — Patient Instructions (Addendum)
It is nice to see you today. Continue Symbicort 2 puffs twice daily as you have been doing. Rinse mouth after use We will schedule you for a Lab BiPAP Titration Study to assess need for supplemental oxygen.with BiPAP.  We will call you with results. Follow up with Dr. Gwenlyn Found for evaluation of possible cardiac contribution to shortness of breath. Continue working out with Therapist, nutritional as you have been doing. Continue diet, congratulations on your weight loss. Spacer with your Symbicort  We will instruct you on use. Continue on BiPAP at bedtime. You appear to be benefiting from the treatment Goal is to wear for at least 6 hours each night for maximal clinical benefit. Continue to work on weight loss, as the link between excess weight  and sleep apnea is well established.  Do not drive if sleepy. Remember to clean mask, tubing, filter, and reservoir once weekly with soapy water.  Follow up with Dr. Halford Chessman  In 3 months. CT Chest for Lung Cancer Screening due 12/2017. Please contact office for sooner follow up if symptoms do not improve or worsen or seek emergency care

## 2017-11-14 NOTE — Progress Notes (Signed)
History of Present Illness Mariah Kelley is a 67 y.o. female   former smoker with dyspnea, COPD with emphysema, and sleep apnea on BiPAP ( Failed CPAP)    11/14/2017 Follow Up Appointment . Pt was seen by Dr. Halford Chessman 10/01/2017 for worsening dyspnea with exertion. Plan after that visit was as follows:  COPD with emphysema and asthma. - will get full PFTs - will try her on trelegy in place of symbicort - continue prn albuterol  Obstructive sleep apnea. - she was tried on CPAP and failed to improve - continue auto Bipap qhs - she is compliant with Bipap and reports benefit - will arrange for overnight oximetry with her using Bipap  History of tobacco abuse. - she should be getting set up for repeat low dose CT chest through lung cancer screening program in February 2019  History of CAD. - if her breathing symptoms do not improve with adjustment to her pulmonary regimen, then she might need to f/u with Dr. Gwenlyn Kelley to have further assessment of her cardiac function  Obesity with deconditioning. - continue her exercise and weight loss regimen  She presents today for follow up. She states she had an adverse reaction to Trelegy and has resumed her Symbicort.She stated that the inhaler caused a terrible cough. This is why she resumed her Symbicort. She states cough has resolved.. She states since Christmas her breathing is better. She has lost 18 pounds with exercise and diet. She states she is not having to use her rescue inhaler with exercise. She does note that her HR goes up to the 130 range with exercise, however.She has significant family history of CAD, both parents died of CAD, mother at 40 and father at 47.She is working out with a Clinical research associate. She states she is doing well with her exercises.She is careful not to let her HR get above 135.She denies any secretions or cough. She denies fever, chest pain or orthopnea. She does have some throat hoarseness which started about 1 month  ago. ? If 2/2 Trelegy powder base? She is due for Lung Cancer Screening 12/2017.    Test Results: Down Load BiPAP IPAP is 18 cm H2O EPAP is 5 cm H2O PS is 4 cm H2O 11/14/2017>> AHI=2.0 Compliance is 100% 7+ hours per night   Pulmonary tests Methacholine challenge 11/24/16 >> FEV1 1.91 (66%), FEV1% 79, + methacholine challenge CT chest lung cancer screen 12/13/16 >> emphysema, atherosclerosis PFT's 10/04/2017>> Interpretation: Combined obstructive and restrictive defect. Small airway obstruction. Positive bronchodilator response based on change in FEF 25-75%. Normal diffusion capacity  Sleep tests O&O 10/03/2017>> ONO with Bipap 10/03/17 >> test time 7 hrs 1 min.  Average SpO2 90%, low SpO2 83%.  Spent 58 min with SpO2 < 88%. PSG 05/01/17 >> AHI 19, SpO2 low 62% Bipap titration 06/28/17 >> failed CPAP; Bipap 12/8 cm H2O PSG 08/01/17 > AHI 26.8; auto Bipap Auto Bipap 08/31/17 to 09/30/17 >> used on 31 of 31 nights with average 7 hrs 40 min.  Average AHI 2.3 with median Bipap 11/7 and 95 th percentile Bipap 13/9 cm H2O  Past medical history She  has a past medical history of Arthritis, Breast cancer of upper-outer quadrant of right female breast (Mariah Kelley) (12/23/2014), Bruises easily, CAD S/P percutaneous coronary angioplasty (01/17/2014), Cataracts, bilateral, Diabetes mellitus without complication ([place]), History of bronchitis (7-95yrs ago), History of tobacco abuse, Hyperlipidemia, Hypertension, Joint pain, Obesity, OSA treated with BiPAP, and ST elevation myocardial infarction (STEMI) of inferolateral wall (Mariah Kelley) (01/17/2014).  CBC Latest Ref Rng & Units 04/20/2017 04/11/2017 10/19/2016  WBC 4.0 - 10.5 K/uL 10.8(H) 7.4 12.6(H)  Hemoglobin 12.0 - 15.0 g/dL 11.6(L) 12.4 11.8(L)  Hematocrit 36.0 - 46.0 % 37.5 38.8 37.5  Platelets 150 - 400 K/uL 274 284 400    BMP Latest Ref Rng & Units 04/20/2017 04/11/2017 10/19/2016  Glucose 65 - 99 mg/dL 156(H) 176(H) 124(H)  BUN 6 - 20 mg/dL 14 16 11    Creatinine 0.44 - 1.00 mg/dL 0.91 0.87 0.86  Sodium 135 - 145 mmol/L 135 139 142  Potassium 3.5 - 5.1 mmol/L 4.8 3.8 3.8  Chloride 101 - 111 mmol/L 98(L) 104 100(L)  CO2 22 - 32 mmol/L 28 26 32  Calcium 8.9 - 10.3 mg/dL 9.1 9.6 9.6    PFT    Component Value Date/Time   FEV1PRE 1.84 10/04/2017 1558   FEV1POST 1.94 10/04/2017 1558   FVCPRE 2.36 10/04/2017 1558   FVCPOST 2.36 10/04/2017 1558   TLC 4.66 10/04/2017 1558   DLCOUNC 24.55 10/04/2017 1558   PREFEV1FVCRT 78 10/04/2017 1558   PSTFEV1FVCRT 82 10/04/2017 1558    No results Kelley.   Past medical hx Past Medical History:  Diagnosis Date  . Arthritis   . Breast cancer of upper-outer quadrant of right female breast (Mariah Kelley) 12/23/2014  . Bruises easily   . CAD S/P percutaneous coronary angioplasty 01/17/2014   takes Brilinta and Plavix daily  . Cataracts, bilateral   . Diabetes mellitus without complication (Mariah Kelley)   . History of bronchitis 7-22yrs ago  . History of tobacco abuse    40 pack-years  . Hyperlipidemia    takes Lipitor daily  . Hypertension    takes Metoprolol daily  . Joint pain   . Obesity   . OSA treated with BiPAP    moderate with AHI 19/hr now on BIPAP auto  . ST elevation myocardial infarction (STEMI) of inferolateral wall (Mariah Kelley) 01/17/2014     Social History   Tobacco Use  . Smoking status: Former Smoker    Packs/day: 1.00    Years: 40.00    Pack years: 40.00    Types: Cigarettes    Last attempt to quit: 07/30/2009    Years since quitting: 8.2  . Smokeless tobacco: Former Systems developer  . Tobacco comment: quit 5 yrs ago  Substance Use Topics  . Alcohol use: No  . Drug use: No    [name] reports that she quit smoking about 8 years ago. Her smoking use included cigarettes. She has a 40.00 pack-year smoking history. She has quit using smokeless tobacco. She reports that she does not drink alcohol or use drugs.  Tobacco Cessation: Former smoker, quit 2010  Past surgical hx, Family hx, Social hx  all reviewed.  Current Outpatient Medications on File Prior to Visit  Medication Sig  . albuterol (PROVENTIL HFA;VENTOLIN HFA) 108 (90 Base) MCG/ACT inhaler Inhale 2 puffs into the lungs every 6 (six) hours as needed for wheezing or shortness of breath.  . anastrozole (ARIMIDEX) 1 MG tablet Take 1 tablet (1 mg total) by mouth daily.  [name] aspirin EC 81 MG tablet Take 81 mg by mouth daily.  [name] atorvastatin (LIPITOR) 80 MG tablet TAKE 1 TABLET (80 MG TOTAL) BY MOUTH DAILY AT 6 PM.  . budesonide-formoterol (SYMBICORT) 160-4.5 MCG/ACT inhaler Inhale 2 puffs into the lungs 2 (two) times daily.  . Cholecalciferol (VITAMIN D3) 2000 units TABS Take 2,000 Units by mouth daily.  . clopidogrel (PLAVIX) 75 MG tablet TAKE 1 TABLET BY  MOUTH EVERY DAY  . metoprolol tartrate (LOPRESSOR) 50 MG tablet TAKE 1 TABLET (50 MG TOTAL) BY MOUTH 2 (TWO) TIMES DAILY.  . nitroGLYCERIN (NITROSTAT) 0.4 MG SL tablet PLACE 1 TABLET UNDER THE TONGUE EVERY 5 MINUTES AS NEEDED FOR CHEST PAIN.   No current facility-administered medications on file prior to visit.      Allergies  Allergen Reactions  . Prednisone Other (See Comments)    "made poison ivy worse", rebound effect when take prednisone  . Codeine Nausea Only and Other (See Comments)    HEADACHE    Review Of Systems:  Constitutional:   + intentional   weight loss ( 18 pounds) , No  night sweats,  Fevers, chills, fatigue, or  lassitude.  HEENT:   No headaches,  Difficulty swallowing,  Tooth/dental problems, or  Sore throat,                No sneezing, itching, ear ache, nasal congestion, post nasal drip, + hoarse voice  CV:  No chest pain,  Orthopnea, PND, swelling in lower extremities, anasarca, dizziness, palpitations, syncope.   GI  No heartburn, indigestion, abdominal pain, nausea, vomiting, diarrhea, change in bowel habits, loss of appetite, bloody stools.   Resp: +, but improved  shortness of breath with exertion none  at rest.  No excess mucus, no  productive cough,  No non-productive cough,  No coughing up of blood.  No change in color of mucus.  No wheezing.  No chest wall deformity  Skin: no rash or lesions.  GU: no dysuria, change in color of urine, no urgency or frequency.  No flank pain, no hematuria   MS:  No joint pain or swelling.  No decreased range of motion.  No back pain.  Psych:  No change in mood or affect. No depression or anxiety.  No memory loss.   Vital Signs BP (!) 144/78 (BP Location: Left Arm, Cuff Size: Large)   Pulse 86   Ht 5\' 9"  (1.753 m)   Wt (!) 304 lb 9.6 oz (138.2 kg)   SpO2 99%   BMI 44.98 kg/m    Physical Exam:  General- No distress,  A&Ox3, pleasant ENT: No sinus tenderness, TM clear, pale nasal mucosa, no oral exudate,no post nasal drip, no LAN, hoarse voice Cardiac: S1, S2, regular rate and rhythm, no murmur Chest: No wheeze/ rales/ dullness; no accessory muscle use, no nasal flaring, no sternal retractions Abd.: Soft Non-tender, non-distended, obese Ext: No clubbing cyanosis, edema Neuro:  normal strength with improving conditioning with weight loss Skin: No rashes, warm and dry Psych: normal mood and behavior   Assessment/Plan  COPD with chronic bronchitis and emphysema (HCC) PFT's reviewed: Combined obstructive and restrictive defect. Small airway obstruction. Positive bronchodilator response based on change in FEF 25-75%. Normal diffusion capacity ADR to Trelegy Continue Symbicort daily Continue ProAir as needed for break through SOB or wheezing. Add Spacer with inhaler use ( Voice hoarseness) Instructed on use.  OSA treated with BiPAP Compliant with therapy O&O indicates need for supplemental oxygen ONO with Bipap 10/03/17 >> test time 7 hrs 1 min.  Average SpO2 90%, low SpO2 83%.  Spent 58 min with SpO2 < 88%. Plan: We will schedule you for a Lab BiPAP Titration Study to assess need for supplemental oxygen.with BiPAP.  We will call you with results. Continue on BiPAP  at bedtime. You appear to be benefiting from the treatment Goal is to wear for at least 6 hours each night for  maximal clinical benefit. Continue to work on weight loss, as the link between excess weight  and sleep apnea is well established.  Do not drive if sleepy. Remember to clean mask, tubing, filter, and reservoir once weekly with soapy water.  Follow up with Dr. Halford Chessman  In 3 months. Please contact office for sooner follow up if symptoms do not improve or worsen or seek emergency care      History of tobacco abuse Schedule for 12/2017 LDCT through the lung cancer screening program  CAD S/P percutaneous coronary angioplasty - inferior STEMI: Mid RCA PCI Promus DES 3.0 mm x 12 mm (3.5 mm) Significant family Hx. Mother died at 67, first MI in her 47"s Pt. With MI at 59>> stent HR to the 130's with exercise Plan: Will refer to Dr. Gwenlyn Kelley for evaluation of cardiac function and possible impact on shortness of breath.   Morbid obesity ([place]) Has lost 18 pounds over Christmas Is working out with a Games developer to work on [place], NP 11/14/2017  5:01 PM

## 2017-11-14 NOTE — Assessment & Plan Note (Signed)
Significant family Hx. Mother died at 43, first MI in her 40"s Pt. With MI at 59>> stent HR to the 130's with exercise Plan: Will refer to Dr. Gwenlyn Found for evaluation of cardiac function and possible impact on shortness of breath.

## 2017-11-14 NOTE — Assessment & Plan Note (Addendum)
PFT's reviewed: Combined obstructive and restrictive defect. Small airway obstruction. Positive bronchodilator response based on change in FEF 25-75%. Normal diffusion capacity ADR to Trelegy Continue Symbicort daily Continue ProAir as needed for break through SOB or wheezing. Add Spacer with inhaler use ( Voice hoarseness) Instructed on use.

## 2017-11-15 NOTE — Telephone Encounter (Signed)
Noted .  Will add pt to my tickle list.

## 2017-11-29 ENCOUNTER — Ambulatory Visit: Payer: Medicare Other | Admitting: Cardiology

## 2017-12-07 DIAGNOSIS — F4323 Adjustment disorder with mixed anxiety and depressed mood: Secondary | ICD-10-CM | POA: Diagnosis not present

## 2017-12-14 ENCOUNTER — Ambulatory Visit
Admission: RE | Admit: 2017-12-14 | Discharge: 2017-12-14 | Disposition: A | Discharge status: Home / Self Care | Payer: Medicare Other | Source: Ambulatory Visit | Attending: Acute Care | Admitting: Acute Care

## 2017-12-14 DIAGNOSIS — Z87891 Personal history of nicotine dependence: Secondary | ICD-10-CM

## 2017-12-14 DIAGNOSIS — Z122 Encounter for screening for malignant neoplasm of respiratory organs: Secondary | ICD-10-CM

## 2017-12-16 IMAGING — CT CT CHEST LUNG CANCER SCREENING LOW DOSE W/O CM
2 of 5 series · 8 of 40 positions shown, 10 images · non-contrast
Comparison: Standard CT chest 04/05/2015

CLINICAL DATA: 65-year-old female with 40 pack-year history of
smoking. Lung cancer screening.

EXAM:
CT CHEST WITHOUT CONTRAST LOW-DOSE FOR LUNG CANCER SCREENING
TECHNIQUE: Multidetector CT imaging of the chest was performed following the
standard protocol without IV contrast.

[Series 4: cor · coronal · 0.62mm/px · 3 of 291 slices shown]
[im 59/291  lung]
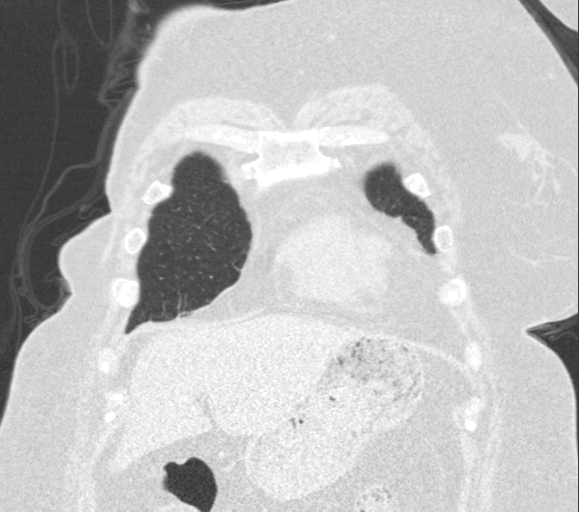
[im 117/291  lung]
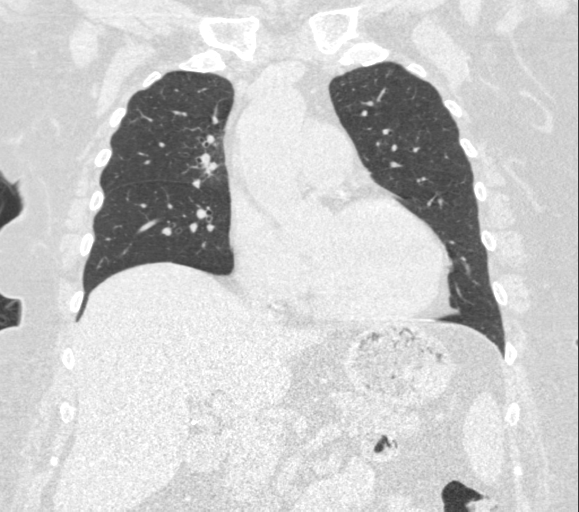
[im 175/291  lung]
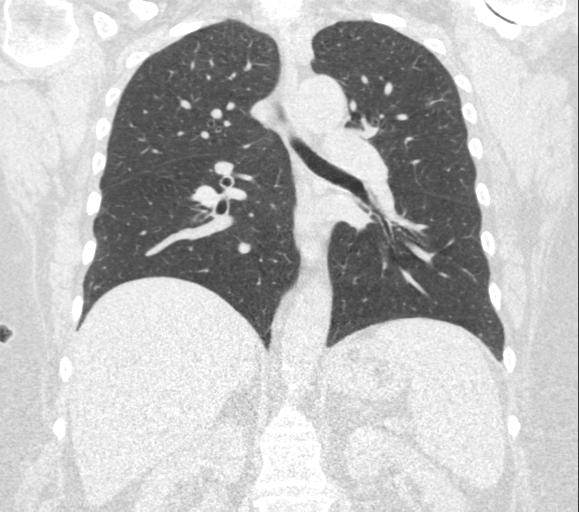

[ct lung segmentation data · axial · 0.80mm/px · z∈[-51,-51]mm · 5 of 254 frames shown]
[frame 1/254  mediastinal]
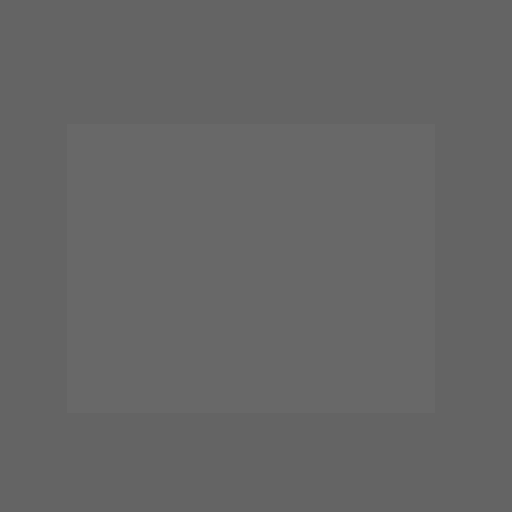
[frame 1/254  lung]
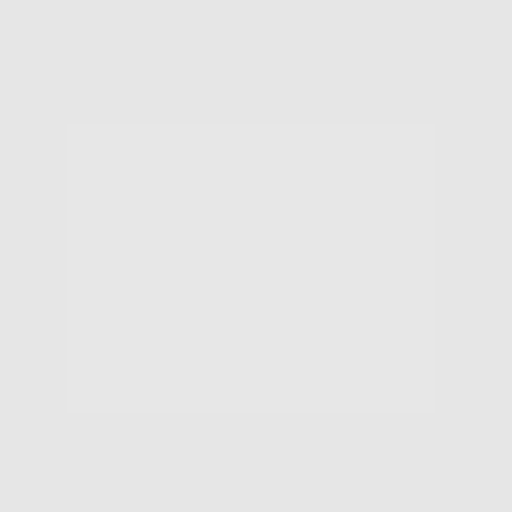
[frame 29/254  lung]
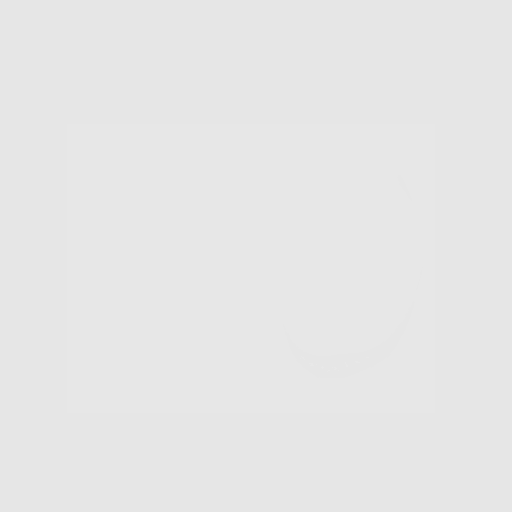
[frame 57/254  lung]
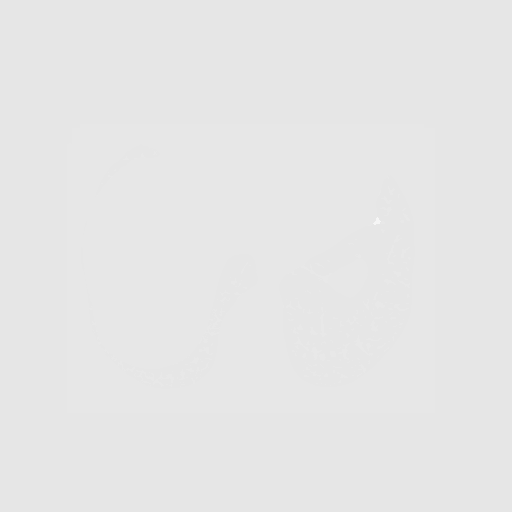
[frame 85/254  lung]
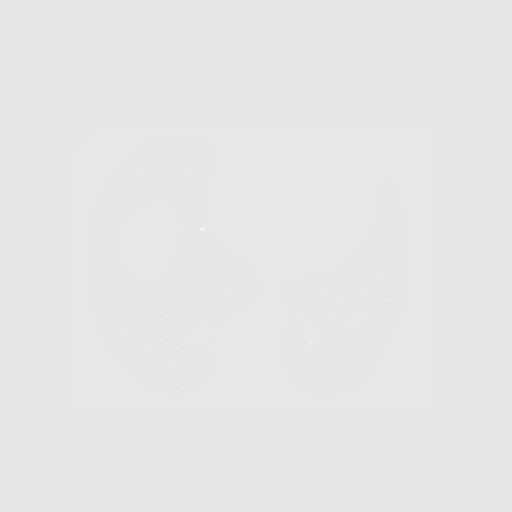
[frame 113/254  mediastinal]
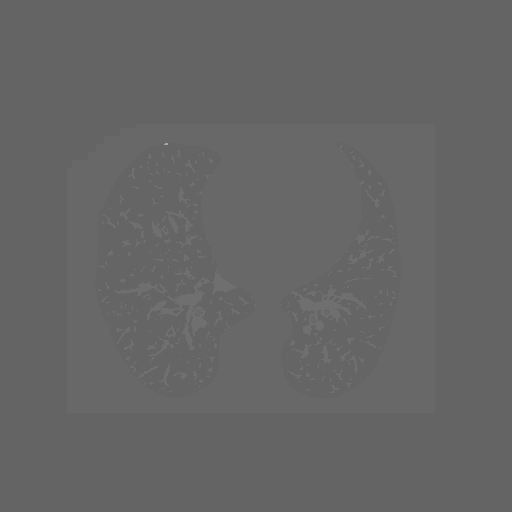
[frame 113/254  lung]
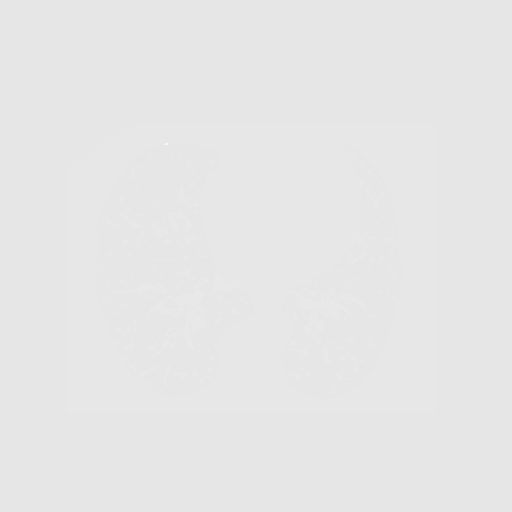

[8 of 40 positions shown; findings below may reference images not displayed]

FINDINGS: Cardiovascular: The heart size is normal. No pericardial effusion.
Coronary artery calcification is noted. Atherosclerotic
calcification is noted in the wall of the thoracic aorta.

Mediastinum/Nodes: No mediastinal lymphadenopathy. No evidence for
gross hilar lymphadenopathy although assessment is limited by the
lack of intravenous contrast on today's study. There is no axillary
lymphadenopathy. The esophagus has normal imaging features.

Lungs/Pleura: Subtle changes of emphysema. Mild bronchial wall
thickening noted. No suspicious pulmonary nodule or mass. No focal
airspace consolidation. No pulmonary edema or pleural effusion.

Upper Abdomen: Gallbladder surgically absent.

Musculoskeletal: Bone windows reveal no worrisome lytic or sclerotic
osseous lesions. Right mastectomy.
IMPRESSION: 1. Lung-RADS Category 1, negative. Continue annual screening with
low-dose chest CT without contrast in 12 months.
2. Emphysema.
3. Coronary artery atherosclerosis.

## 2017-12-26 ENCOUNTER — Telehealth: Payer: Self-pay | Admitting: Pulmonary Disease

## 2017-12-26 MED ORDER — BUDESONIDE-FORMOTEROL FUMARATE 160-4.5 MCG/ACT IN AERO: 2.0000 | INHALATION_SPRAY | Freq: Two times a day (BID) | RESPIRATORY_TRACT | 5 refills | Status: DC

## 2017-12-26 NOTE — Telephone Encounter (Signed)
Called and spoke with pt verifying the med and pharmacy she was wanting med to be sent to.  Pt stated she was needing a refill of symbicort 160 sent to her preferred pharmacy.  Med refill sent to pt's pharmacy.  Nothing further needed at this current time.

## 2018-01-04 DIAGNOSIS — F4323 Adjustment disorder with mixed anxiety and depressed mood: Secondary | ICD-10-CM | POA: Diagnosis not present

## 2018-01-08 ENCOUNTER — Ambulatory Visit: Payer: Medicare Other | Attending: Acute Care | Admitting: Pulmonary Disease

## 2018-01-08 DIAGNOSIS — Z122 Encounter for screening for malignant neoplasm of respiratory organs: Secondary | ICD-10-CM

## 2018-01-10 ENCOUNTER — Ambulatory Visit: Payer: Medicare Other | Attending: Pulmonary Disease | Admitting: Pulmonary Disease

## 2018-01-10 DIAGNOSIS — Z122 Encounter for screening for malignant neoplasm of respiratory organs: Secondary | ICD-10-CM | POA: Diagnosis not present

## 2018-01-10 DIAGNOSIS — G4733 Obstructive sleep apnea (adult) (pediatric): Secondary | ICD-10-CM | POA: Diagnosis not present

## 2018-01-11 ENCOUNTER — Telehealth: Payer: Self-pay | Admitting: Acute Care

## 2018-01-11 DIAGNOSIS — G4733 Obstructive sleep apnea (adult) (pediatric): Secondary | ICD-10-CM

## 2018-01-11 DIAGNOSIS — G4730 Sleep apnea, unspecified: Secondary | ICD-10-CM | POA: Diagnosis not present

## 2018-01-11 NOTE — Telephone Encounter (Signed)
Please call patient and let her know we will place an order for the recommendations for BiPAP  Dr. Elsworth Soho has made after reviewing her BiPAP titration study.  Please place order with patients DME for the following:   [place] a trial of Auto-BiPAP IPAP max 18, EPAP min8 , PS 5 - Avoid alcohol, sedatives and other CNS depressants that may worsen sleep apnea and disrupt normal sleep architecture. - Sleep hygiene should be reviewed to assess factors that may improve sleep quality. - Weight management and regular exercise should be initiated or continued. - Return to Sleep Center for re-evaluation after 4 weeks of therapy  Please schedule her for return visit 4 weeks after therapy has been initiated with new settings with down Load for review. Thanks so much.[name]

## 2018-01-11 NOTE — Procedures (Signed)
Patient Name: Mariah Kelley, Mariah Kelley Date: 01/10/2018 Gender: Female D.O.B: [birth year] Age (years): 42 Referring Provider: Magdalen Spatz NP Height (inches): 69 Interpreting Physician: Kara Mead MD, ABSM Weight (lbs): 290 RPSGT: Peak, Robert BMI: 43 MRN: [id number] Neck Size: 17.00 <br> <br> CLINICAL INFORMATION The patient is referred for a BiPAP titration to treat sleep apnea.  PSG 05/01/17 >> AHI 19, SpO2 low 62% Bipap titration 06/28/17 >> failed CPAP; Bipap 12/8 cm H2O PSG 08/01/17 > AHI 26.8  SLEEP STUDY TECHNIQUE As per the AASM Manual for the Scoring of Sleep and Associated Events v2.3 (April 2016) with a hypopnea requiring 4% desaturations.  The channels recorded and monitored were frontal, central and occipital EEG, electrooculogram (EOG), submentalis EMG (chin), nasal and oral airflow, thoracic and abdominal wall motion, anterior tibialis EMG, snore microphone, electrocardiogram, and pulse oximetry. Bilevel positive airway pressure (BPAP) was initiated at the beginning of the study and titrated to treat sleep-disordered breathing.  MEDICATIONS Medications self-administered by patient taken the night of the study : SYMBICORT, METOPROLOL, ATORVASTATIN  RESPIRATORY PARAMETERS Max IPAP Pressure (cm): 18 AHI at Optimal Pressure (/hr) N/A Min EPAP Pressure (cm): 8 , PS +5   Overall Minimal O2 (%): 84.0 Minimal O2 at Optimal Pressure (%): 84.0   SLEEP ARCHITECTURE Start Time: 9:54:11 PM Stop Time: 5:11:43 AM Total Time (min): 437.5 Total Sleep Time (min): 334.5 Sleep Latency (min): 1.4 Sleep Efficiency (%): 76.5% REM Latency (min): 166.5 WASO (min): 101.6 Stage N1 (%): 23.8% Stage N2 (%): 60.4% Stage N3 (%): 1.9% Stage R (%): 13.90 Supine (%): 0.00 Arousal Index (/hr): 36.8     CARDIAC DATA The 2 lead EKG demonstrated sinus rhythm. The mean heart rate was 49.0 beats per minute. Other EKG findings include: None.   LEG MOVEMENT DATA The total Periodic Limb  Movements of Sleep (PLMS) were 0. The PLMS index was 0.0. A PLMS index of <15 is considered normal in adults.  IMPRESSIONS - An auto BiPAP pressure wasselected for this patient based on the available study data - IPAP max 18, EPAP min8 , PS 5 - Central sleep apnea was not noted during this titration (CAI = 1.1/h). - Moderete oxygen desaturations were observed during this titration (min O2 = 84.0%). - No snoring was audible during this study. - No cardiac abnormalities were observed during this study. - Clinically significant periodic limb movements were not noted during this study. Arousals associated with PLMs were rare.   DIAGNOSIS - Obstructive Sleep Apnea (327.23 [G47.33 ICD-10])   RECOMMENDATIONS - Recommend a trial of Auto-BiPAP IPAP max 18, EPAP min8 , PS 5 - Avoid alcohol, sedatives and other CNS depressants that may worsen sleep apnea and disrupt normal sleep architecture. - Sleep hygiene should be reviewed to assess factors that may improve sleep quality. - Weight management and regular exercise should be initiated or continued. - Return to Sleep Center for re-evaluation after 4 weeks of therapy    Kara Mead MD Board Certified in [place]

## 2018-01-14 NOTE — Telephone Encounter (Signed)
ATC pt, no answer. Left message for pt to call back.  

## 2018-01-15 NOTE — Telephone Encounter (Signed)
Pt is returning call. CB is [phone].

## 2018-01-15 NOTE — Telephone Encounter (Signed)
Called pt and advised message from the provider. Pt understood and verbalized understanding. Nothing further is needed.   Order for Bipap placed.

## 2018-01-28 ENCOUNTER — Ambulatory Visit: Payer: 59 | Admitting: Hematology and Oncology

## 2018-01-31 ENCOUNTER — Inpatient Hospital Stay: Payer: Medicare Other | Attending: Hematology and Oncology | Admitting: Hematology and Oncology

## 2018-01-31 ENCOUNTER — Telehealth: Payer: Self-pay | Admitting: Hematology and Oncology

## 2018-01-31 DIAGNOSIS — C50411 Malignant neoplasm of upper-outer quadrant of right female breast: Secondary | ICD-10-CM

## 2018-01-31 DIAGNOSIS — Z170 Estrogen receptor positive status [ER+]: Secondary | ICD-10-CM | POA: Diagnosis not present

## 2018-01-31 MED ORDER — ANASTROZOLE 1 MG PO TABS: 1.0000 mg | ORAL_TABLET | Freq: Every day | ORAL | 3 refills | Status: DC

## 2018-01-31 NOTE — Telephone Encounter (Signed)
Patient declined AVs and calendar of upcoming march 2020 appointments.

## 2018-01-31 NOTE — Progress Notes (Signed)
 Patient Care Team: Wolters, Sharon, MD as PCP - General (Family Medicine) Newman, David, MD as Consulting Physician (General Surgery) Gudena, Vinay, MD as Consulting Physician (Hematology and Oncology) Wentworth, Stacy, MD (Inactive) as Consulting Physician (Radiation Oncology) Stuart, Dawn C, RN as Registered Nurse Martini, Keisha N, RN as Registered Nurse Dawson, Gretchen W, NP as Nurse Practitioner (Nurse Practitioner) Thimmappa, Brinda, MD as Consulting Physician (Plastic Surgery) Mackey, Heather Thompson, NP as Nurse Practitioner (Nurse Practitioner)  DIAGNOSIS:  Encounter Diagnosis  Name Primary?  . Malignant neoplasm of upper-outer quadrant of right breast in female, estrogen receptor positive (HCC)     SUMMARY OF ONCOLOGIC HISTORY:   Malignant neoplasm of upper-outer quadrant of right breast in female, estrogen receptor positive (HCC)   04/29/2014 Procedure    Prior right breast bx: fibrocystic changes with calcifications, no evidence of malignancy.      12/11/2014 Mammogram    Right breast: focal asymmetric density with indistinct margin at 11:00 middle depth. Also area of grouped heterogenous calcifications at 12:00, middle depth, increased in number.      12/11/2014 Breast US    Right breast: 2 cm round mass with irregular margin in UOQ, posterior depth; hypoechoic with posterior acoustic enhancement; lobulated LN with focal cortex thickening in right axilla      12/17/2014 Initial Biopsy    Right breast biopsy: Invasive ductal carcinoma with DCIS, right axillary lymph node benign      12/24/2014 Procedure    Right breast needle core bx: at least atypical ductal hyperplasia with associated calcifications - suspicious for DCIS      12/29/2014 Breast MRI    2.1 cm mass in the upper-outer quadrant of the right breast 2 possible satellite nodules associated with this mass. NME 2.2cm, together 5.1 cm      12/29/2014 Clinical Stage    Stage IIA: T2 N0      01/27/2015  Definitive Surgery    Right breast mastectomy/SLNB (Newman): invasive ductal carcinoma, grade 2, with DCIS (int. grade), spanning 2.2 cm, ER+ (100%), PR- (0%,) HER-2 negative (ratio 1.53), Ki-67 21%. 1 LN negative for malignancy (0/1)      01/27/2015 Pathologic Stage    Stage IIA: pT2 pN0      01/27/2015 Oncotype testing    Score: 25 (16% ROR)      03/03/2015 - 05/04/2015 Chemotherapy    Adjuvant chemotherapy with docetaxel and cyclophosphamide 4 (Gudena).      06/04/2015 -  Anti-estrogen oral therapy    Anastrozole 1 mg daily. Planned duration of therapy 5 years (Gudena)      08/25/2015 Survivorship    Survivorship care plan completed and mailed to patient in lieu of in person visit       CHIEF COMPLIANT: Follow-up on anastrozole therapy  INTERVAL HISTORY: Mariah Kelley is a 67-year-old with above-mentioned history of right breast cancer treated with mastectomy followed by adjuvant chemotherapy and is currently on anastrozole therapy.  She is tolerating anastrozole extremely well.  She does not have any hot flashes or myalgias.  She goes to [employer] and works out and stays very active.  She is losing weight and is trying to get healthy.  REVIEW OF SYSTEMS:   Constitutional: Denies fevers, chills or abnormal weight loss Eyes: Denies blurriness of vision Ears, nose, mouth, throat, and face: Denies mucositis or sore throat Respiratory: Denies cough, dyspnea or wheezes Cardiovascular: Denies palpitation, chest discomfort Gastrointestinal:  Denies nausea, heartburn or change in bowel habits Skin: Denies abnormal skin   rashes Lymphatics: Denies new lymphadenopathy or easy bruising Neurological:Denies numbness, tingling or new weaknesses Behavioral/Psych: Mood is stable, no new changes  Extremities: No lower extremity edema Breast:  denies any pain or lumps or nodules in chest wall or breast All other systems were reviewed with the patient and are negative.  I have  reviewed the past medical history, past surgical history, social history and family history with the patient and they are unchanged from previous note.  ALLERGIES:  is allergic to prednisone and codeine.  MEDICATIONS:  Current Outpatient Medications  Medication Sig Dispense Refill  . albuterol (PROVENTIL HFA;VENTOLIN HFA) 108 (90 Base) MCG/ACT inhaler Inhale 2 puffs into the lungs every 6 (six) hours as needed for wheezing or shortness of breath. 1 Inhaler 2  . anastrozole (ARIMIDEX) 1 MG tablet Take 1 tablet (1 mg total) by mouth daily. 90 tablet 3  . aspirin EC 81 MG tablet Take 81 mg by mouth daily.    [name] atorvastatin (LIPITOR) 80 MG tablet TAKE 1 TABLET (80 MG TOTAL) BY MOUTH DAILY AT 6 PM. 30 tablet 10  . budesonide-formoterol (SYMBICORT) 160-4.5 MCG/ACT inhaler Inhale 2 puffs into the lungs 2 (two) times daily. 1 Inhaler 5  . Cholecalciferol (VITAMIN D3) 2000 units TABS Take 2,000 Units by mouth daily.    . clopidogrel (PLAVIX) 75 MG tablet TAKE 1 TABLET BY MOUTH EVERY DAY 30 tablet 9  . metoprolol tartrate (LOPRESSOR) 50 MG tablet TAKE 1 TABLET (50 MG TOTAL) BY MOUTH 2 (TWO) TIMES DAILY. 60 tablet 10  . nitroGLYCERIN (NITROSTAT) 0.4 MG SL tablet PLACE 1 TABLET UNDER THE TONGUE EVERY 5 MINUTES AS NEEDED FOR CHEST PAIN. 25 tablet 1  . Spacer/Aero-Holding Chambers (AEROCHAMBER MV) inhaler Use as instructed 1 each 0   No current facility-administered medications for this visit.     PHYSICAL EXAMINATION: ECOG PERFORMANCE STATUS: 1 - Symptomatic but completely ambulatory  Vitals:   01/31/18 1425  BP: (!) 156/82  Pulse: 86  Resp: 18  Temp: 97.8 F (36.6 C)  SpO2: 97%   Filed Weights   01/31/18 1425  Weight: 292 lb 14.4 oz (132.9 kg)    GENERAL:alert, no distress and comfortable SKIN: skin color, texture, turgor are normal, no rashes or significant lesions EYES: normal, Conjunctiva are pink and non-injected, sclera clear OROPHARYNX:no exudate, no erythema and lips, buccal  mucosa, and tongue normal  NECK: supple, thyroid normal size, non-tender, without nodularity LYMPH:  no palpable lymphadenopathy in the cervical, axillary or inguinal LUNGS: clear to auscultation and percussion with normal breathing effort HEART: regular rate & rhythm and no murmurs and no lower extremity edema ABDOMEN:abdomen soft, non-tender and normal bowel sounds MUSCULOSKELETAL:no cyanosis of digits and no clubbing  NEURO: alert & oriented x 3 with fluent speech, no focal motor/sensory deficits EXTREMITIES: No lower extremity edema BREAST: No palpable masses or nodules in the right chest wall or left breast. No palpable axillary supraclavicular or infraclavicular adenopathy no breast tenderness or nipple discharge. (exam performed in the presence of a chaperone)  LABORATORY DATA:  I have reviewed the data as listed CMP Latest Ref Rng & Units 04/20/2017 04/11/2017 10/19/2016  Glucose 65 - 99 mg/dL 156(H) 176(H) 124(H)  BUN 6 - 20 mg/dL _0 Creatinine 0.44 - 1.00 mg/dL 0.91 0.87 0.86  Sodium 135 - 145 mmol/L 135 139 142  Potassium 3.5 - 5.1 mmol/L 4.8 3.8 3.8  Chloride 101 - 111 mmol/L 98(L) 104 100(L)  CO2 22 - 32 mmol/L 28 26  32  Calcium 8.9 - 10.3 mg/dL 9.1 9.6 9.6  Total Protein 6.5 - 8.1 g/dL - 6.6 6.8  Total Bilirubin 0.3 - 1.2 mg/dL - 0.8 0.5  Alkaline Phos 38 - 126 U/L - 97 101  AST 15 - 41 U/L - 20 17  ALT 14 - 54 U/L - 19 25    Lab Results  Component Value Date   WBC 10.8 (H) 04/20/2017   HGB 11.6 (L) 04/20/2017   HCT 37.5 04/20/2017   MCV 90.8 04/20/2017   PLT 274 04/20/2017   NEUTROABS 5.3 04/11/2017    ASSESSMENT & PLAN:  Malignant neoplasm of upper-outer quadrant of right breast in female, estrogen receptor positive (HCC) Right breast mastectomy 01/27/2015: 2.2 cm grade 2 invasive ductal carcinoma ER 100%, PR 0%, HER-2 negative, Ki-67 21%, margins negative, sentinel lymph nodes negative Oncotype score 25; 16 % risk of recurrence with tamoxifen alone;  status post adjuvant chemotherapy with Taxotere and Cytoxan started 03/03/2015 completed 05/04/2015, started anastrozole 06/07/2015  Anastrozole toxicities: No major side effects anastrozole. 1. Minimal hot flashes. 2. Denies any myalgias.  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 Breast exam 01/31/2018: Benign 2. Mammograms need to be done annually  Return to clinic in 1 year for follow-up      No orders of the defined types were placed in this encounter.  The patient has a good understanding of the overall plan. she agrees with it. she will call with any problems that may develop before the next visit here.   Viinay K Gudena, MD 01/31/18    

## 2018-01-31 NOTE — Assessment & Plan Note (Signed)
Right breast mastectomy 01/27/2015: 2.2 cm grade 2 invasive ductal carcinoma ER 100%, PR 0%, HER-2 negative, Ki-67 21%, margins negative, sentinel lymph nodes negative Oncotype score 25; 16 % risk of recurrence with tamoxifen alone; status post adjuvant chemotherapy with Taxotere and Cytoxan started 03/03/2015 completed 05/04/2015, started anastrozole 06/07/2015  Anastrozole toxicities: No major side effects anastrozole. 1. Minimal hot flashes. 2. Denies any myalgias.  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 Breast exam 01/31/2018: Benign 2. Mammograms need to be done annually  Return to clinic in 1 year for follow-up

## 2018-02-01 DIAGNOSIS — F4323 Adjustment disorder with mixed anxiety and depressed mood: Secondary | ICD-10-CM | POA: Diagnosis not present

## 2018-02-05 NOTE — Procedures (Signed)
Patient Name: Mariah Kelley, Mariah Kelley Date: 01/10/2018 Gender: Female D.O.B: 06/10/1951 Age (years): 89 Referring Provider: Magdalen Spatz NP Height (inches): 69 Interpreting Physician: Kara Mead MD, ABSM Weight (lbs): 290 RPSGT: Peak, Robert BMI: 43 MRN: [id number] Neck Size: 17.00 <br> <br> CLINICAL INFORMATION The patient is referred for a BiPAP titration to treat sleep apnea.  PSG 05/01/17 >> AHI 19, SpO2 low 62% Bipap titration 06/28/17 >> failed CPAP; Bipap 12/8 cm H2O PSG 08/01/17 > AHI 26.8  SLEEP STUDY TECHNIQUE As per the AASM Manual for the Scoring of Sleep and Associated Events v2.3 (April 2016) with a hypopnea requiring 4% desaturations.  The channels recorded and monitored were frontal, central and occipital EEG, electrooculogram (EOG), submentalis EMG (chin), nasal and oral airflow, thoracic and abdominal wall motion, anterior tibialis EMG, snore microphone, electrocardiogram, and pulse oximetry. Bilevel positive airway pressure (BPAP) was initiated at the beginning of the study and titrated to treat sleep-disordered breathing.  MEDICATIONS Medications self-administered by patient taken the night of the study : SYMBICORT, METOPROLOL, ATORVASTATIN  RESPIRATORY PARAMETERS Max IPAP Pressure (cm):       18        AHI at Optimal Pressure (/hr) N/A Min EPAP Pressure (cm):       8 , PS +5           Overall Minimal O2 (%):         84.0     Minimal O2 at Optimal Pressure (%): 84.0   SLEEP ARCHITECTURE Start Time:      9:54:11 PM      Stop Time:       5:11:43 AM      Total Time (min):         437.5   Total Sleep Time (min):      334.5 Sleep Latency (min):   1.4       Sleep Efficiency (%):   76.5%  REM Latency (min):    166.5   WASO (min):  101.6 Stage N1 (%): 23.8%  Stage N2 (%): 60.4%  Stage N3 (%): 1.9%    Stage R (%):   13.90 Supine (%):     0.00     Arousal Index (/hr):     36.8         CARDIAC DATA The 2 lead EKG demonstrated sinus rhythm. The mean  heart rate was 49.0 beats per minute. Other EKG findings include: None.   LEG MOVEMENT DATA The total Periodic Limb Movements of Sleep (PLMS) were 0. The PLMS index was 0.0. A PLMS index of <15 is considered normal in adults.  IMPRESSIONS - An auto BiPAP pressure wasselected for this patient based on the available study data - IPAP max 18, EPAP min8 , PS 5 - Central sleep apnea was not noted during this titration (CAI = 1.1/h). - Moderete oxygen desaturations were observed during this titration (min O2 = 84.0%). - No snoring was audible during this study. - No cardiac abnormalities were observed during this study. - Clinically significant periodic limb movements were not noted during this study. Arousals associated with PLMs were rare.   DIAGNOSIS - Obstructive Sleep Apnea (327.23 [G47.33 ICD-10])   RECOMMENDATIONS - Recommend a trial of Auto-BiPAP IPAP max 18, EPAP min8 , PS 5 - Avoid alcohol, sedatives and other CNS depressants that may worsen sleep apnea and disrupt normal sleep architecture. - Sleep hygiene should be reviewed to assess factors that may improve sleep quality. - Weight management and regular exercise should be  initiated or continued. - Return to [facility] Center for re-evaluation after 4 weeks of therapy    Kara Mead MD Board Certified in [place]

## 2018-02-27 ENCOUNTER — Encounter: Payer: Self-pay | Admitting: Pulmonary Disease

## 2018-02-27 ENCOUNTER — Ambulatory Visit (INDEPENDENT_AMBULATORY_CARE_PROVIDER_SITE_OTHER): Payer: Medicare Other | Admitting: Pulmonary Disease

## 2018-02-27 VITALS — BP 122/82 | HR 70 | Ht 68.0 in | Wt 295.0 lb

## 2018-02-27 DIAGNOSIS — I2510 Atherosclerotic heart disease of native coronary artery without angina pectoris: Secondary | ICD-10-CM

## 2018-02-27 DIAGNOSIS — J449 Chronic obstructive pulmonary disease, unspecified: Secondary | ICD-10-CM

## 2018-02-27 DIAGNOSIS — G4733 Obstructive sleep apnea (adult) (pediatric): Secondary | ICD-10-CM

## 2018-02-27 DIAGNOSIS — Z9861 Coronary angioplasty status: Secondary | ICD-10-CM | POA: Diagnosis not present

## 2018-02-27 DIAGNOSIS — J432 Centrilobular emphysema: Secondary | ICD-10-CM | POA: Diagnosis not present

## 2018-02-27 NOTE — Progress Notes (Signed)
[facility]  Chief Complaint  Patient presents with  . Follow-up    Pt is doing well with bipap machine overall.     Vital signs: BP 122/82 (BP Location: Left Arm, Cuff Size: Normal)   Pulse 70   Ht 5\' 8"  (1.727 m)   Wt 295 lb (133.8 kg)   SpO2 96%   BMI 44.85 kg/m   History of Present Illness: Mariah Kelley is a 67 y.o. female COPD with emphysema and sleep apnea.  Since her last visit she had in lab titration.  Didn't need supplemental oxygen.  She uses Bipap nightly.  No issues with mask fit.  Feels rested.  No sinus congestion or dry mouth.  She uses symbicort bid.  Breathing okay expect when she works outside.  Then can get cough, wheeze, and chest congestion.  Had to use albuterol, but heart rate increased.    She is not having chest pain, skin rash, or leg swelling.  Physical Exam:  General - pleasant Eyes - pupils reactive ENT - no sinus tenderness, no oral exudate, no LAN Cardiac - regular, no murmur Chest - no wheeze, rales Abd - soft, non tender Ext - no edema Skin - no rashes Neuro - normal strength Psych - normal mood  Assessment/Plan:  COPD with emphysema and asthma. - continue symbicort and prn albuterol - advised her to pretreat prior to working outside - discussed how increased heart rate is know side effect from albuterol  Obstructive sleep apnea. - she is compliant with Bipap and reports benefit - didn't need supplemental oxygen based on recent titration study - continue Auto Bipap  History of tobacco abuse. - will try to figure out whether she is eligible for lung cancer CT screening with her history of breast cancer  Hx of CAD. - she has f/u with cardiology scheduled  Obesity. - discussed importance of weight loss   Patient Instructions  Will check on whether you are eligible for CT chest screening for lung cancer  Follow up in 1 year  Time spent 27 minutes  Chesley Mires,  MD [place] 02/27/2018, 10:29 AM  Flow Sheet  Pulmonary tests: Methacholine challenge 11/24/16 >> FEV1 1.91 (66%), FEV1% 79, + methacholine challenge CT chest lung cancer screen 12/13/16 >> emphysema, atherosclerosis  Sleep tests PSG 05/01/17 >> AHI 19, SpO2 low 62% Bipap titration 06/28/17 >> failed CPAP; Bipap 12/8 cm H2O PSG 08/01/17 > AHI 26.8; auto Bipap ONO with Bipap 10/03/17 >> test time 7 hrs 1 min.  Average SpO2 90%, low SpO2 83%.  Spent 58 min with SpO2 < 88%. Auto Bipap 01/27/18 to 02/25/18 >> used on 30 of 30 nights with average 8 hrs 5 min.  Average AHI 1.5 with median Bipap 14/9 and 95 th percentile Bipap 15/10 cm H2O  Past Medical History: She  has a past medical history of Arthritis, Breast cancer of upper-outer quadrant of right female breast ([place]) (12/23/2014), Bruises easily, CAD S/P percutaneous coronary angioplasty (01/17/2014), Cataracts, bilateral, Diabetes mellitus without complication ([place]), History of bronchitis (7-32yrs ago), History of tobacco abuse, Hyperlipidemia, Hypertension, Joint pain, Obesity, OSA treated with BiPAP, and ST elevation myocardial infarction (STEMI) of inferolateral wall ([place]) (01/17/2014).  Past Surgical History: She  has a past surgical history that includes Abdominal hysterectomy; Spinal fusion; Appendectomy; Percutaneous coronary stent intervention (pci-s); left heart cath (Bilateral, 01/17/2014); Cholecystectomy; Tubal ligation; Colonoscopy; Mastectomy w/ sentinel node biopsy (Right, 01/27/2015); Breast reduction surgery (Left, 01/27/2015); Mastectomy w/  sentinel node biopsy (Right, 01/27/2015); Breast reduction surgery (Left, 01/27/2015); Portacath placement (Left, 03/23/2015); Cataract extraction w/PHACO (Right, 04/11/2016); Cataract extraction w/PHACO (Left, 05/02/2016); and Total shoulder arthroplasty (Left, 04/19/2017).  Family History: Her family history includes CAD in her brother and brother; Heart attack (age of onset: 51) in  her mother; Heart attack (age of onset: 23) in her father.  Social History: She  reports that she quit smoking about 8 years ago. Her smoking use included cigarettes. She has a 40.00 pack-year smoking history. She has quit using smokeless tobacco. She reports that she does not drink alcohol or use drugs.  Medications: Allergies as of 02/27/2018      Reactions   Prednisone Other (See Comments)   "made poison ivy worse", rebound effect when take prednisone   Codeine Nausea Only, Other (See Comments)   HEADACHE      Medication List        Accurate as of 02/27/18 10:29 AM. Always use your most recent med list.          AEROCHAMBER MV inhaler Use as instructed   albuterol 108 (90 Base) MCG/ACT inhaler Commonly known as:  PROVENTIL HFA;VENTOLIN HFA Inhale 2 puffs into the lungs every 6 (six) hours as needed for wheezing or shortness of breath.   anastrozole 1 MG tablet Commonly known as:  ARIMIDEX Take 1 tablet (1 mg total) by mouth daily.   aspirin EC 81 MG tablet Take 81 mg by mouth daily.   atorvastatin 80 MG tablet Commonly known as:  LIPITOR TAKE 1 TABLET (80 MG TOTAL) BY MOUTH DAILY AT 6 PM.   budesonide-formoterol 160-4.5 MCG/ACT inhaler Commonly known as:  SYMBICORT Inhale 2 puffs into the lungs 2 (two) times daily.   clopidogrel 75 MG tablet Commonly known as:  PLAVIX TAKE 1 TABLET BY MOUTH EVERY DAY   metoprolol tartrate 50 MG tablet Commonly known as:  LOPRESSOR TAKE 1 TABLET (50 MG TOTAL) BY MOUTH 2 (TWO) TIMES DAILY.   nitroGLYCERIN 0.4 MG SL tablet Commonly known as:  NITROSTAT PLACE 1 TABLET UNDER THE TONGUE EVERY 5 MINUTES AS NEEDED FOR CHEST PAIN.   Vitamin D3 2000 units Tabs Take 2,000 Units by mouth daily.

## 2018-02-27 NOTE — Patient Instructions (Signed)
Will check on whether you are eligible for CT chest screening for lung cancer  Follow up in 1 year

## 2018-03-08 ENCOUNTER — Telehealth: Payer: Self-pay | Admitting: Pulmonary Disease

## 2018-03-08 DIAGNOSIS — F4323 Adjustment disorder with mixed anxiety and depressed mood: Secondary | ICD-10-CM | POA: Diagnosis not present

## 2018-03-08 NOTE — Telephone Encounter (Signed)
Explained to pt that she would need to be 5 yrs out from breast cancer before being eligible for f/u low dose CT chest for lung cancer screening.  Her CT chest from 12/13/16 showed mild changes of emphysema, but no nodules.  Will defer further CT chest imaging for now, and reassess after she is at 5 yr mark from tx of breast cancer.

## 2018-03-22 DIAGNOSIS — F4323 Adjustment disorder with mixed anxiety and depressed mood: Secondary | ICD-10-CM | POA: Diagnosis not present

## 2018-03-31 ENCOUNTER — Other Ambulatory Visit: Payer: Self-pay | Admitting: Cardiovascular Disease

## 2018-04-02 ENCOUNTER — Ambulatory Visit (INDEPENDENT_AMBULATORY_CARE_PROVIDER_SITE_OTHER): Payer: Medicare Other | Admitting: Cardiovascular Disease

## 2018-04-02 ENCOUNTER — Encounter: Payer: Self-pay | Admitting: Cardiovascular Disease

## 2018-04-02 VITALS — BP 164/84 | HR 89 | Ht 68.5 in | Wt 301.6 lb

## 2018-04-02 DIAGNOSIS — E785 Hyperlipidemia, unspecified: Secondary | ICD-10-CM | POA: Diagnosis not present

## 2018-04-02 DIAGNOSIS — I2119 ST elevation (STEMI) myocardial infarction involving other coronary artery of inferior wall: Secondary | ICD-10-CM

## 2018-04-02 DIAGNOSIS — Z9861 Coronary angioplasty status: Secondary | ICD-10-CM | POA: Diagnosis not present

## 2018-04-02 DIAGNOSIS — R0989 Other specified symptoms and signs involving the circulatory and respiratory systems: Secondary | ICD-10-CM | POA: Diagnosis not present

## 2018-04-02 DIAGNOSIS — I2510 Atherosclerotic heart disease of native coronary artery without angina pectoris: Secondary | ICD-10-CM

## 2018-04-02 DIAGNOSIS — I10 Essential (primary) hypertension: Secondary | ICD-10-CM | POA: Diagnosis not present

## 2018-04-02 NOTE — Progress Notes (Signed)
04/02/2018 Mariah Kelley   Apr 30, 1951  [id number]  Primary Physician Jonathon Jordan, MD Primary Cardiologist: [name] FACP, [place], [place], [place]  HPI:  Mariah Kelley is a 67 y.o.  w/ PMHx s/f HTN, HLD, prior tobacco abuse, obesity and family h/o premature CAD I last saw her in the office  04/03/2017.Mariah Kelley She was admitted to [facility] today for STEMI 01/17/14. She was in her USOH until ~2AM this morning when she experienced sudden onset substernal chest pressure radiating to her neck and arms bilaterally w/ associated nausea and vomiting. The discomfort persisted through the morning. She took an ASA 81mg  ~0600 w/ minimal relief. When the discomfort intensified to a 7-8/10, she sought medical care.  She has no prior cardiac history. She reports smoking 1 PPD x 40 years, no current tobacco abuse. Mother with MI in her 66s, father with MI at 16, brothers w/ CAD. Previously on medication for high cholesterol. No current medications.  In the ED, EKG revealed NSR w/ 77mm ST elevations in II, III, aVF and V5, V6 meeting diagnostic criteria for an inferolateral STEMI. Code STEMI was called, and she was transported emergently to the cardiac cath lab. I placed a drug-eluting stent in her dominant mid RCA for a hazy 80% plaque. The remainder of her coronary anatomy was free of significant disease in her function was preserved. She was discharged in stable condition. Since that time she has seen [place] PA-C in the office in followup. She does deny chest pain or shortness of breath. She has lost 40 pounds as made lifestyle modifications.since I saw her back in the office a year ago she's remained clinically stable specifically denying chest pain or shortness of breath.  She was diagnosed with breast cancer underwent mastectomy one year ago and chemotherapy subsequent to that. She is currently cancer free. She exercises 4 times a week but unfortunately continues to gain  weight. She had her left shoulder replaced approximately 1 year ago.  Since I saw her a year ago she is remained stable.  She is currently seeing a pulmonologist (Dr. Halford Chessman) for COPD.  Chest pain.  She is contemplating the possibility of bariatric surgery.     Current Meds  Medication Sig  . albuterol (PROVENTIL HFA;VENTOLIN HFA) 108 (90 Base) MCG/ACT inhaler Inhale 2 puffs into the lungs every 6 (six) hours as needed for wheezing or shortness of breath.  . anastrozole (ARIMIDEX) 1 MG tablet Take 1 tablet (1 mg total) by mouth daily.  Mariah Kelley aspirin EC 81 MG tablet Take 81 mg by mouth daily.  Mariah Kelley atorvastatin (LIPITOR) 80 MG tablet TAKE 1 TABLET (80 MG TOTAL) BY MOUTH DAILY AT 6 PM.  . budesonide-formoterol (SYMBICORT) 160-4.5 MCG/ACT inhaler Inhale 2 puffs into the lungs 2 (two) times daily.  . Cholecalciferol (VITAMIN D3) 2000 units TABS Take 2,000 Units by mouth daily.  . clopidogrel (PLAVIX) 75 MG tablet TAKE 1 TABLET BY MOUTH EVERY DAY  . metoprolol tartrate (LOPRESSOR) 50 MG tablet TAKE 1 TABLET (50 MG TOTAL) BY MOUTH 2 (TWO) TIMES DAILY.  . nitroGLYCERIN (NITROSTAT) 0.4 MG SL tablet PLACE 1 TABLET UNDER THE TONGUE EVERY 5 MINUTES AS NEEDED FOR CHEST PAIN.  Mariah Kelley Spacer/Aero-Holding Chambers (AEROCHAMBER MV) inhaler Use as instructed     Allergies  Allergen Reactions  . Prednisone Other (See Comments)    "made poison ivy worse", rebound effect when take prednisone  . Codeine Nausea Only and Other (See Comments)  HEADACHE    Social History   Socioeconomic History  . Marital status: Single    Spouse name: Not on file  . Number of children: Not on file  . Years of education: Not on file  . Highest education level: Not on file  Occupational History  . Occupation: Journalist, newspaper  . Financial resource strain: Not on file  . Food insecurity:    Worry: Not on file    Inability: Not on file  . Transportation needs:    Medical: Not on file    Non-medical: Not on file    Tobacco Use  . Smoking status: Former Smoker    Packs/day: 1.00    Years: 40.00    Pack years: 40.00    Types: Cigarettes    Last attempt to quit: 07/30/2009    Years since quitting: 8.6  . Smokeless tobacco: Former Systems developer  . Tobacco comment: quit 5 yrs ago  Substance and Sexual Activity  . Alcohol use: No  . Drug use: No  . Sexual activity: Never    Birth control/protection: Surgical  Lifestyle  . Physical activity:    Days per week: Not on file    Minutes per session: Not on file  . Stress: Not on file  Relationships  . Social connections:    Talks on phone: Not on file    Gets together: Not on file    Attends religious service: Not on file    Active member of club or organization: Not on file    Attends meetings of clubs or organizations: Not on file    Relationship status: Not on file  . Intimate partner violence:    Fear of current or ex partner: Not on file    Emotionally abused: Not on file    Physically abused: Not on file    Forced sexual activity: Not on file  Other Topics Concern  . Not on file  Social History Narrative   Divorced, 2 children.      Review of Systems: General: negative for chills, fever, night sweats or weight changes.  Cardiovascular: negative for chest pain, dyspnea on exertion, edema, orthopnea, palpitations, paroxysmal nocturnal dyspnea or shortness of breath Dermatological: negative for rash Respiratory: negative for cough or wheezing Urologic: negative for hematuria Abdominal: negative for nausea, vomiting, diarrhea, bright red blood per rectum, melena, or hematemesis Neurologic: negative for visual changes, syncope, or dizziness All other systems reviewed and are otherwise negative except as noted above.    Blood pressure (!) 190/84, pulse 89, height 5' 8.5" (1.74 m), weight (!) 301 lb 9.6 oz (136.8 kg).  General appearance: alert and no distress Neck: no adenopathy, no JVD, supple, symmetrical, trachea midline, thyroid not  enlarged, symmetric, no tenderness/mass/nodules and Soft bilateral carotid bruits Lungs: clear to auscultation bilaterally Heart: regular rate and rhythm, S1, S2 normal, no murmur, click, rub or gallop Extremities: extremities normal, atraumatic, no cyanosis or edema Pulses: 2+ and symmetric Skin: Skin color, texture, turgor normal. No rashes or lesions Neurologic: Alert and oriented X 3, normal strength and tone. Normal symmetric reflexes. Normal coordination and gait  EKG normal sinus rhythm at 81 voltage criteria for LVH.  I personally reviewed this EKG.  ASSESSMENT AND PLAN:   ST elevation myocardial infarction (STEMI) of inferolateral wall History of CAD status post inferior STEMI 01/17/2014 treated with drug-eluting stenting of the proximal RCA for rate 80% "hazy plaque.  The remainder of her coronary anatomy was free of significant disease.  She denies chest pain.  Dyslipidemia History of dyslipidemia on statin therapy.  We will recheck a lipid and liver profile  HTN (hypertension) History of essential hypertension her blood pressure measured at 190/84.  She is on metoprolol.  We will recheck her blood pressure prior to sending her out the door today      [name] [facility], [facility] 04/02/2018 1:34 PM

## 2018-04-02 NOTE — Telephone Encounter (Signed)
Rx sent to pharmacy   

## 2018-04-02 NOTE — Patient Instructions (Signed)
Medication Instructions: Your physician recommends that you continue on your current medications as directed. Please refer to the Current Medication list given to you today.  Labwork: Your physician recommends that you return for a FASTING lipid profile and hepatic function panel at your earliest convenience.   Testing/Procedures: Your physician has requested that you have a carotid duplex. This test is an ultrasound of the carotid arteries in your neck. It looks at blood flow through these arteries that supply the brain with blood. Allow one hour for this exam. There are no restrictions or special instructions.  Follow-Up: Your physician wants you to follow-up in: 1 year with Dr. Gwenlyn Found. You will receive a reminder letter in the mail two months in advance. If you don't receive a letter, please call our office to schedule the follow-up appointment.  If you need a refill on your cardiac medications before your next appointment, please call your pharmacy.

## 2018-04-02 NOTE — Assessment & Plan Note (Signed)
History of dyslipidemia on statin therapy.  We will recheck a lipid and liver profile. 

## 2018-04-02 NOTE — Assessment & Plan Note (Signed)
History of CAD status post inferior STEMI 01/17/2014 treated with drug-eluting stenting of the proximal RCA for rate 80% "hazy plaque.  The remainder of her coronary anatomy was free of significant disease.  She denies chest pain.

## 2018-04-02 NOTE — Assessment & Plan Note (Signed)
History of essential hypertension her blood pressure measured at 190/84.  She is on metoprolol.  We will recheck her blood pressure prior to sending her out the door today

## 2018-04-03 ENCOUNTER — Ambulatory Visit (HOSPITAL_COMMUNITY)
Admission: RE | Admit: 2018-04-03 | Discharge: 2018-04-03 | Disposition: A | Discharge status: Home / Self Care | Payer: Medicare Other | Source: Ambulatory Visit | Attending: Cardiology | Admitting: Cardiology

## 2018-04-03 DIAGNOSIS — I2510 Atherosclerotic heart disease of native coronary artery without angina pectoris: Secondary | ICD-10-CM | POA: Insufficient documentation

## 2018-04-03 DIAGNOSIS — Z87891 Personal history of nicotine dependence: Secondary | ICD-10-CM | POA: Diagnosis not present

## 2018-04-03 DIAGNOSIS — E119 Type 2 diabetes mellitus without complications: Secondary | ICD-10-CM | POA: Insufficient documentation

## 2018-04-03 DIAGNOSIS — I10 Essential (primary) hypertension: Secondary | ICD-10-CM | POA: Diagnosis not present

## 2018-04-03 DIAGNOSIS — J449 Chronic obstructive pulmonary disease, unspecified: Secondary | ICD-10-CM | POA: Diagnosis not present

## 2018-04-03 DIAGNOSIS — R0989 Other specified symptoms and signs involving the circulatory and respiratory systems: Secondary | ICD-10-CM

## 2018-04-03 DIAGNOSIS — E785 Hyperlipidemia, unspecified: Secondary | ICD-10-CM | POA: Insufficient documentation

## 2018-04-05 DIAGNOSIS — E785 Hyperlipidemia, unspecified: Secondary | ICD-10-CM | POA: Diagnosis not present

## 2018-04-05 LAB — HEPATIC FUNCTION PANEL
ALK PHOS: 115 IU/L (ref 39–117)
ALT: 26 IU/L (ref 0–32)
AST: 27 IU/L (ref 0–40)
Albumin: 4.2 g/dL (ref 3.6–4.8)
Bilirubin Total: 0.6 mg/dL (ref 0.0–1.2)
Bilirubin, Direct: 0.14 mg/dL (ref 0.00–0.40)
TOTAL PROTEIN: 6.5 g/dL (ref 6.0–8.5)

## 2018-04-05 LAB — LIPID PANEL
Chol/HDL Ratio: 3.4 ratio (ref 0.0–4.4)
Cholesterol, Total: 167 mg/dL (ref 100–199)
HDL: 49 mg/dL (ref >39)
LDL Calculated: 91 mg/dL (ref 0–99)
Triglycerides: 134 mg/dL (ref 0–149)
VLDL CHOLESTEROL CAL: 27 mg/dL (ref 5–40)

## 2018-04-08 ENCOUNTER — Other Ambulatory Visit: Payer: Self-pay | Admitting: *Deleted

## 2018-04-08 DIAGNOSIS — E785 Hyperlipidemia, unspecified: Secondary | ICD-10-CM

## 2018-04-11 DIAGNOSIS — E119 Type 2 diabetes mellitus without complications: Secondary | ICD-10-CM | POA: Diagnosis not present

## 2018-04-11 DIAGNOSIS — Z6841 Body Mass Index (BMI) 40.0 and over, adult: Secondary | ICD-10-CM | POA: Diagnosis not present

## 2018-04-11 DIAGNOSIS — J449 Chronic obstructive pulmonary disease, unspecified: Secondary | ICD-10-CM | POA: Diagnosis not present

## 2018-04-11 DIAGNOSIS — Z7902 Long term (current) use of antithrombotics/antiplatelets: Secondary | ICD-10-CM | POA: Diagnosis not present

## 2018-04-11 DIAGNOSIS — Z87891 Personal history of nicotine dependence: Secondary | ICD-10-CM | POA: Diagnosis not present

## 2018-04-11 DIAGNOSIS — K219 Gastro-esophageal reflux disease without esophagitis: Secondary | ICD-10-CM | POA: Diagnosis not present

## 2018-04-11 DIAGNOSIS — I2510 Atherosclerotic heart disease of native coronary artery without angina pectoris: Secondary | ICD-10-CM | POA: Diagnosis not present

## 2018-04-16 DIAGNOSIS — E1169 Type 2 diabetes mellitus with other specified complication: Secondary | ICD-10-CM | POA: Diagnosis not present

## 2018-04-16 DIAGNOSIS — G4733 Obstructive sleep apnea (adult) (pediatric): Secondary | ICD-10-CM | POA: Diagnosis not present

## 2018-04-16 DIAGNOSIS — I10 Essential (primary) hypertension: Secondary | ICD-10-CM | POA: Diagnosis not present

## 2018-04-16 DIAGNOSIS — Z6841 Body Mass Index (BMI) 40.0 and over, adult: Secondary | ICD-10-CM | POA: Diagnosis not present

## 2018-04-16 DIAGNOSIS — K219 Gastro-esophageal reflux disease without esophagitis: Secondary | ICD-10-CM | POA: Diagnosis not present

## 2018-04-16 DIAGNOSIS — Z9989 Dependence on other enabling machines and devices: Secondary | ICD-10-CM | POA: Diagnosis not present

## 2018-04-16 DIAGNOSIS — E785 Hyperlipidemia, unspecified: Secondary | ICD-10-CM | POA: Diagnosis not present

## 2018-04-16 DIAGNOSIS — Z713 Dietary counseling and surveillance: Secondary | ICD-10-CM | POA: Diagnosis not present

## 2018-04-18 DIAGNOSIS — K224 Dyskinesia of esophagus: Secondary | ICD-10-CM | POA: Diagnosis not present

## 2018-04-18 DIAGNOSIS — K219 Gastro-esophageal reflux disease without esophagitis: Secondary | ICD-10-CM | POA: Diagnosis not present

## 2018-04-19 DIAGNOSIS — Z96612 Presence of left artificial shoulder joint: Secondary | ICD-10-CM | POA: Diagnosis not present

## 2018-04-19 DIAGNOSIS — Z09 Encounter for follow-up examination after completed treatment for conditions other than malignant neoplasm: Secondary | ICD-10-CM | POA: Diagnosis not present

## 2018-04-19 DIAGNOSIS — M25512 Pain in left shoulder: Secondary | ICD-10-CM | POA: Diagnosis not present

## 2018-04-19 DIAGNOSIS — F4323 Adjustment disorder with mixed anxiety and depressed mood: Secondary | ICD-10-CM | POA: Diagnosis not present

## 2018-04-22 IMAGING — DX DG SHOULDER 1V*L*
1 series · 1 of 1 positions shown · non-contrast
Comparison: None.

CLINICAL DATA: Status post total shoulder replacement

EXAM:
LEFT SHOULDER - 1 VIEW

[shoulder ap]
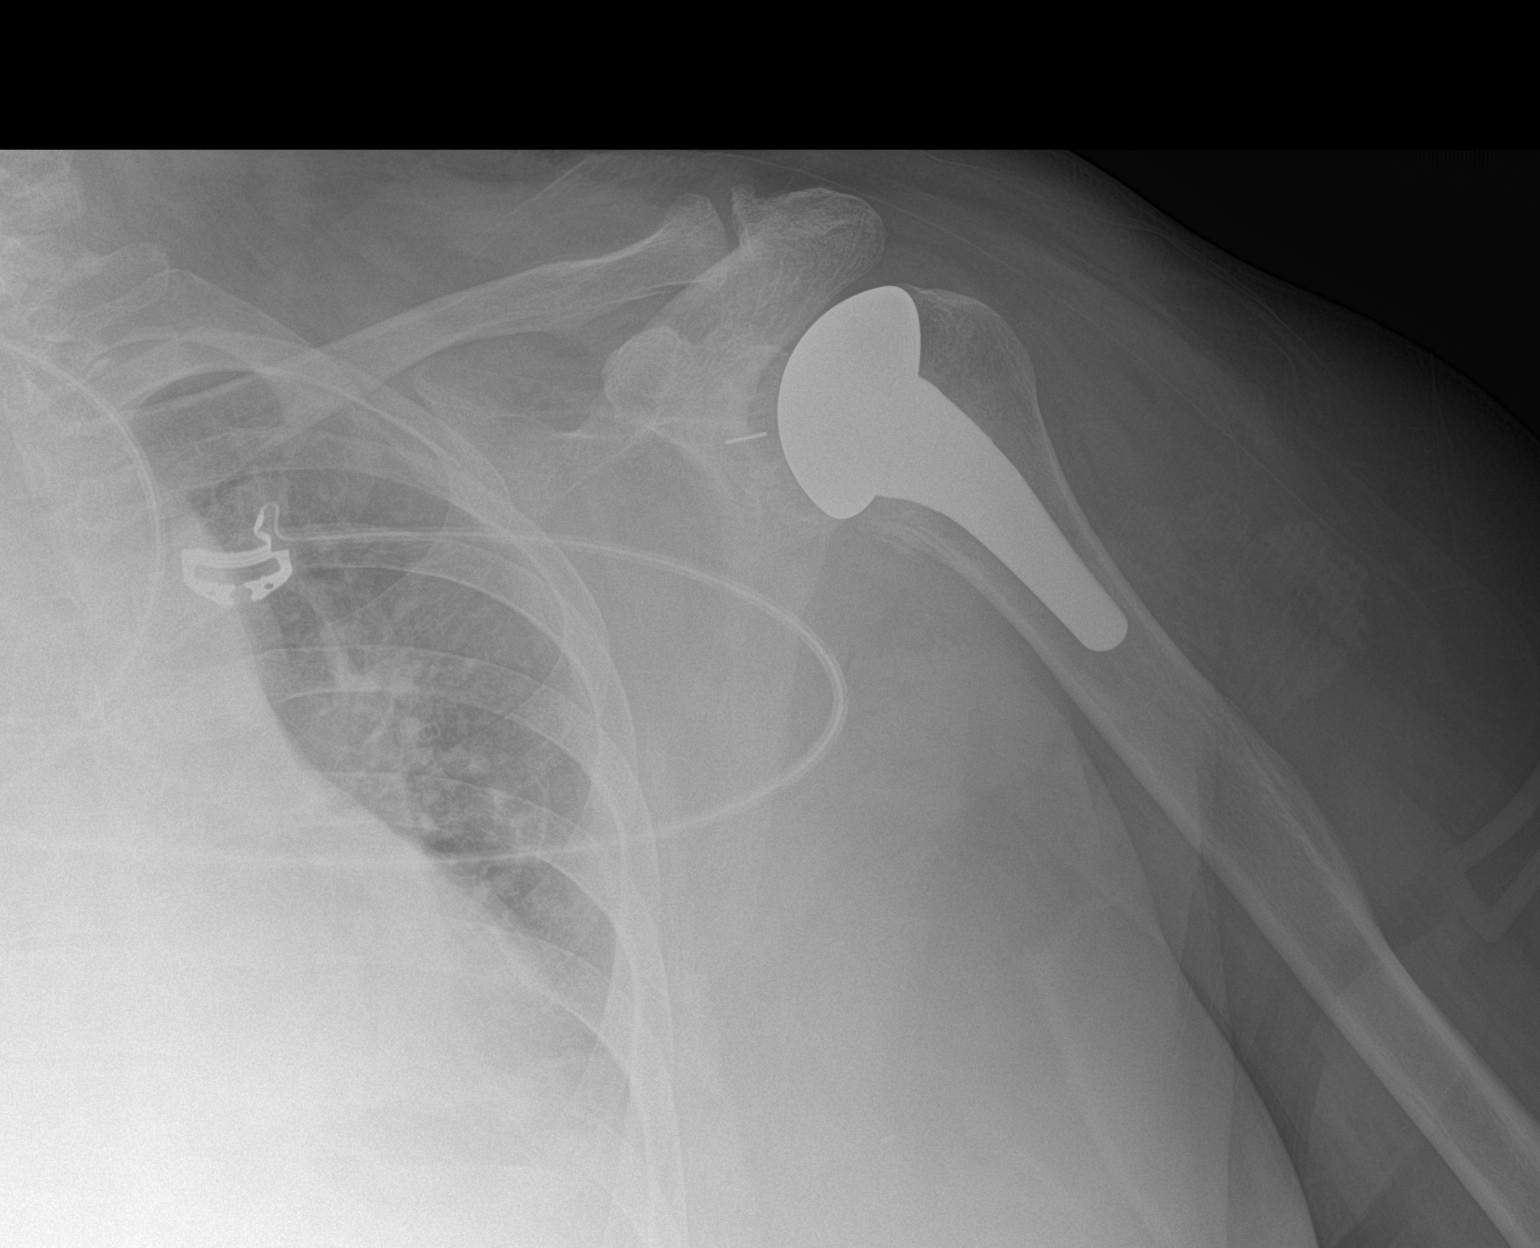

[1 of 1 positions shown; findings below may reference images not displayed]

FINDINGS: There is a left proximal humeral prosthesis on the left well-seated
on frontal view. There is osteoarthritic change in the
acromioclavicular joint. No fracture or dislocation. Visualized left
lung is clear.
IMPRESSION: Left proximal humeral prosthesis well-seated. No fracture or
dislocation. There is moderate osteoarthritic change in the
acromioclavicular joint on the left.

## 2018-05-08 DIAGNOSIS — I213 ST elevation (STEMI) myocardial infarction of unspecified site: Secondary | ICD-10-CM | POA: Diagnosis not present

## 2018-05-08 DIAGNOSIS — Z0000 Encounter for general adult medical examination without abnormal findings: Secondary | ICD-10-CM | POA: Diagnosis not present

## 2018-05-08 DIAGNOSIS — J449 Chronic obstructive pulmonary disease, unspecified: Secondary | ICD-10-CM | POA: Diagnosis not present

## 2018-05-08 DIAGNOSIS — R0989 Other specified symptoms and signs involving the circulatory and respiratory systems: Secondary | ICD-10-CM | POA: Diagnosis not present

## 2018-05-08 DIAGNOSIS — I10 Essential (primary) hypertension: Secondary | ICD-10-CM | POA: Diagnosis not present

## 2018-05-08 DIAGNOSIS — E7800 Pure hypercholesterolemia, unspecified: Secondary | ICD-10-CM | POA: Diagnosis not present

## 2018-05-08 DIAGNOSIS — J45909 Unspecified asthma, uncomplicated: Secondary | ICD-10-CM | POA: Diagnosis not present

## 2018-05-08 DIAGNOSIS — E559 Vitamin D deficiency, unspecified: Secondary | ICD-10-CM | POA: Diagnosis not present

## 2018-05-08 DIAGNOSIS — E1121 Type 2 diabetes mellitus with diabetic nephropathy: Secondary | ICD-10-CM | POA: Diagnosis not present

## 2018-05-08 DIAGNOSIS — N182 Chronic kidney disease, stage 2 (mild): Secondary | ICD-10-CM | POA: Diagnosis not present

## 2018-05-08 DIAGNOSIS — Z853 Personal history of malignant neoplasm of breast: Secondary | ICD-10-CM | POA: Diagnosis not present

## 2018-05-08 DIAGNOSIS — Z79899 Other long term (current) drug therapy: Secondary | ICD-10-CM | POA: Diagnosis not present

## 2018-05-15 DIAGNOSIS — E1121 Type 2 diabetes mellitus with diabetic nephropathy: Secondary | ICD-10-CM | POA: Diagnosis not present

## 2018-05-16 DIAGNOSIS — Z6841 Body Mass Index (BMI) 40.0 and over, adult: Secondary | ICD-10-CM | POA: Diagnosis not present

## 2018-05-16 DIAGNOSIS — R0600 Dyspnea, unspecified: Secondary | ICD-10-CM | POA: Diagnosis not present

## 2018-05-16 DIAGNOSIS — E1165 Type 2 diabetes mellitus with hyperglycemia: Secondary | ICD-10-CM | POA: Diagnosis not present

## 2018-05-16 DIAGNOSIS — Z713 Dietary counseling and surveillance: Secondary | ICD-10-CM | POA: Diagnosis not present

## 2018-06-14 DIAGNOSIS — F4323 Adjustment disorder with mixed anxiety and depressed mood: Secondary | ICD-10-CM | POA: Diagnosis not present

## 2018-06-18 DIAGNOSIS — Z79899 Other long term (current) drug therapy: Secondary | ICD-10-CM | POA: Diagnosis not present

## 2018-06-18 DIAGNOSIS — Z6841 Body Mass Index (BMI) 40.0 and over, adult: Secondary | ICD-10-CM | POA: Diagnosis not present

## 2018-06-18 DIAGNOSIS — E1169 Type 2 diabetes mellitus with other specified complication: Secondary | ICD-10-CM | POA: Diagnosis not present

## 2018-06-18 DIAGNOSIS — I10 Essential (primary) hypertension: Secondary | ICD-10-CM | POA: Diagnosis not present

## 2018-07-03 ENCOUNTER — Other Ambulatory Visit: Payer: Self-pay | Admitting: Cardiovascular Disease

## 2018-07-03 ENCOUNTER — Other Ambulatory Visit: Payer: Self-pay | Admitting: Pulmonary Disease

## 2018-07-04 DIAGNOSIS — E785 Hyperlipidemia, unspecified: Secondary | ICD-10-CM | POA: Diagnosis not present

## 2018-07-04 DIAGNOSIS — Z9861 Coronary angioplasty status: Secondary | ICD-10-CM | POA: Diagnosis not present

## 2018-07-04 DIAGNOSIS — J449 Chronic obstructive pulmonary disease, unspecified: Secondary | ICD-10-CM | POA: Diagnosis not present

## 2018-07-04 DIAGNOSIS — R0602 Shortness of breath: Secondary | ICD-10-CM | POA: Diagnosis not present

## 2018-07-04 DIAGNOSIS — I2510 Atherosclerotic heart disease of native coronary artery without angina pectoris: Secondary | ICD-10-CM | POA: Diagnosis not present

## 2018-07-04 NOTE — Telephone Encounter (Signed)
Rx sent to pharmacy   

## 2018-07-05 ENCOUNTER — Telehealth: Payer: Self-pay | Admitting: *Deleted

## 2018-07-05 DIAGNOSIS — E785 Hyperlipidemia, unspecified: Secondary | ICD-10-CM

## 2018-07-05 LAB — LIPID PANEL
CHOL/HDL RATIO: 3.1 ratio (ref 0.0–4.4)
Cholesterol, Total: 163 mg/dL (ref 100–199)
HDL: 52 mg/dL (ref >39)
LDL Calculated: 91 mg/dL (ref 0–99)
Triglycerides: 98 mg/dL (ref 0–149)
VLDL Cholesterol Cal: 20 mg/dL (ref 5–40)

## 2018-07-05 LAB — HEPATIC FUNCTION PANEL
ALBUMIN: 4.4 g/dL (ref 3.6–4.8)
ALT: 29 IU/L (ref 0–32)
AST: 25 IU/L (ref 0–40)
Alkaline Phosphatase: 101 IU/L (ref 39–117)
BILIRUBIN TOTAL: 0.4 mg/dL (ref 0.0–1.2)
BILIRUBIN, DIRECT: 0.11 mg/dL (ref 0.00–0.40)
Total Protein: 6.5 g/dL (ref 6.0–8.5)

## 2018-07-05 MED ORDER — EZETIMIBE 10 MG PO TABS: 10.0000 mg | ORAL_TABLET | Freq: Every day | ORAL | 3 refills | Status: DC

## 2018-07-05 NOTE — Telephone Encounter (Signed)
-----   Message from Lorretta Harp, MD sent at 07/05/2018  7:58 AM EDT ----- Not quite at goal for secondary prevention on high-dose Lipitor.  Add Zetia 10 mg and recheck in 3 months.

## 2018-07-18 DIAGNOSIS — E1169 Type 2 diabetes mellitus with other specified complication: Secondary | ICD-10-CM | POA: Diagnosis not present

## 2018-07-18 DIAGNOSIS — I10 Essential (primary) hypertension: Secondary | ICD-10-CM | POA: Diagnosis not present

## 2018-07-18 DIAGNOSIS — Z6841 Body Mass Index (BMI) 40.0 and over, adult: Secondary | ICD-10-CM | POA: Diagnosis not present

## 2018-07-18 DIAGNOSIS — Z713 Dietary counseling and surveillance: Secondary | ICD-10-CM | POA: Diagnosis not present

## 2018-07-19 DIAGNOSIS — E1121 Type 2 diabetes mellitus with diabetic nephropathy: Secondary | ICD-10-CM | POA: Diagnosis not present

## 2018-07-19 DIAGNOSIS — R7309 Other abnormal glucose: Secondary | ICD-10-CM | POA: Diagnosis not present

## 2018-07-19 DIAGNOSIS — Z7984 Long term (current) use of oral hypoglycemic drugs: Secondary | ICD-10-CM | POA: Diagnosis not present

## 2018-07-19 DIAGNOSIS — E1165 Type 2 diabetes mellitus with hyperglycemia: Secondary | ICD-10-CM | POA: Diagnosis not present

## 2018-07-25 DIAGNOSIS — Z23 Encounter for immunization: Secondary | ICD-10-CM | POA: Diagnosis not present

## 2018-07-25 DIAGNOSIS — E2839 Other primary ovarian failure: Secondary | ICD-10-CM | POA: Diagnosis not present

## 2018-07-25 DIAGNOSIS — M8588 Other specified disorders of bone density and structure, other site: Secondary | ICD-10-CM | POA: Diagnosis not present

## 2018-07-26 DIAGNOSIS — F4323 Adjustment disorder with mixed anxiety and depressed mood: Secondary | ICD-10-CM | POA: Diagnosis not present

## 2018-08-20 DIAGNOSIS — I129 Hypertensive chronic kidney disease with stage 1 through stage 4 chronic kidney disease, or unspecified chronic kidney disease: Secondary | ICD-10-CM | POA: Diagnosis not present

## 2018-08-20 DIAGNOSIS — N182 Chronic kidney disease, stage 2 (mild): Secondary | ICD-10-CM | POA: Diagnosis not present

## 2018-08-20 DIAGNOSIS — E1169 Type 2 diabetes mellitus with other specified complication: Secondary | ICD-10-CM | POA: Diagnosis not present

## 2018-08-20 DIAGNOSIS — E1121 Type 2 diabetes mellitus with diabetic nephropathy: Secondary | ICD-10-CM | POA: Diagnosis not present

## 2018-08-20 DIAGNOSIS — Z6841 Body Mass Index (BMI) 40.0 and over, adult: Secondary | ICD-10-CM | POA: Diagnosis not present

## 2018-08-20 DIAGNOSIS — M8580 Other specified disorders of bone density and structure, unspecified site: Secondary | ICD-10-CM | POA: Diagnosis not present

## 2018-08-20 DIAGNOSIS — I25118 Atherosclerotic heart disease of native coronary artery with other forms of angina pectoris: Secondary | ICD-10-CM | POA: Diagnosis not present

## 2018-08-20 DIAGNOSIS — I10 Essential (primary) hypertension: Secondary | ICD-10-CM | POA: Diagnosis not present

## 2018-08-30 DIAGNOSIS — Z6841 Body Mass Index (BMI) 40.0 and over, adult: Secondary | ICD-10-CM | POA: Diagnosis not present

## 2018-08-30 DIAGNOSIS — Z713 Dietary counseling and surveillance: Secondary | ICD-10-CM | POA: Diagnosis not present

## 2018-09-24 DIAGNOSIS — Z6841 Body Mass Index (BMI) 40.0 and over, adult: Secondary | ICD-10-CM | POA: Diagnosis not present

## 2018-09-24 DIAGNOSIS — Z9989 Dependence on other enabling machines and devices: Secondary | ICD-10-CM | POA: Diagnosis not present

## 2018-09-24 DIAGNOSIS — Z01818 Encounter for other preprocedural examination: Secondary | ICD-10-CM | POA: Diagnosis not present

## 2018-09-24 DIAGNOSIS — I10 Essential (primary) hypertension: Secondary | ICD-10-CM | POA: Diagnosis not present

## 2018-09-24 DIAGNOSIS — G4733 Obstructive sleep apnea (adult) (pediatric): Secondary | ICD-10-CM | POA: Diagnosis not present

## 2018-10-01 DIAGNOSIS — F4323 Adjustment disorder with mixed anxiety and depressed mood: Secondary | ICD-10-CM | POA: Diagnosis not present

## 2018-10-02 ENCOUNTER — Encounter: Payer: Self-pay | Admitting: *Deleted

## 2018-10-16 DIAGNOSIS — E785 Hyperlipidemia, unspecified: Secondary | ICD-10-CM | POA: Diagnosis not present

## 2018-10-16 LAB — LIPID PANEL
CHOLESTEROL TOTAL: 132 mg/dL (ref 100–199)
Chol/HDL Ratio: 2.6 ratio (ref 0.0–4.4)
HDL: 51 mg/dL (ref >39)
LDL Calculated: 57 mg/dL (ref 0–99)
TRIGLYCERIDES: 120 mg/dL (ref 0–149)
VLDL Cholesterol Cal: 24 mg/dL (ref 5–40)

## 2018-10-16 LAB — HEPATIC FUNCTION PANEL
ALT: 23 IU/L (ref 0–32)
AST: 19 IU/L (ref 0–40)
Albumin: 4.5 g/dL (ref 3.6–4.8)
Alkaline Phosphatase: 96 IU/L (ref 39–117)
BILIRUBIN, DIRECT: 0.17 mg/dL (ref 0.00–0.40)
Bilirubin Total: 0.7 mg/dL (ref 0.0–1.2)
Total Protein: 6.6 g/dL (ref 6.0–8.5)

## 2018-10-22 ENCOUNTER — Telehealth: Payer: Self-pay | Admitting: *Deleted

## 2018-10-22 NOTE — Telephone Encounter (Signed)
   [facility] Pre-operative Risk Assessment    Request for surgical clearance:  1. What type of surgery is being performed? Lap sleeve gastrectomy or RNY gastric btpass   2. When is this surgery scheduled? TBD   3. What type of clearance is required (medical clearance vs. Pharmacy clearance to hold med vs. Both)? both  4. Are there any medications that need to be held prior to surgery and how long?plavix-7 days prior to surgery   5. Practice name and name of physician performing surgery? [facility]   6. What is your office phone number [phone]    7.   What is your office fax number [phone] or [phone]  8.   Anesthesia type (None, local, MAC, general) ? general   Fredia Beets 10/22/2018, 7:16 AM  _________________________________________________________________   (provider comments below)

## 2018-10-23 NOTE — Telephone Encounter (Signed)
   Primary Cardiologist:Jonathan Gwenlyn Found, MD  Chart reviewed as part of pre-operative protocol coverage. Because of Mariah Kelley's past medical history and time since last visit, he/she will require a follow-up visit in order to better assess preoperative cardiovascular risk.  Since it has been >6 months from last visit, given weight, history and elevated blood pressure at last OV (up to 190/84), appt requested before clearing.  Pre-op covering staff: - Please schedule appointment and call patient to inform them. - Please contact requesting surgeon's office via preferred method (i.e, phone, fax) to inform them of need for appointment prior to surgery.  If applicable, this message will also be routed to pharmacy pool and/or primary cardiologist for input on holding anticoagulant/antiplatelet agent as requested below so that this information is available at time of patient's appointment. Sending to Dr. Gwenlyn Found re: input on Plavix for when APP sees patient at follow-up.  Charlie Pitter, PA-C  10/23/2018, 5:10 PM

## 2018-10-24 DIAGNOSIS — I10 Essential (primary) hypertension: Secondary | ICD-10-CM | POA: Diagnosis not present

## 2018-10-24 DIAGNOSIS — Z6841 Body Mass Index (BMI) 40.0 and over, adult: Secondary | ICD-10-CM | POA: Diagnosis not present

## 2018-10-24 DIAGNOSIS — Z7984 Long term (current) use of oral hypoglycemic drugs: Secondary | ICD-10-CM | POA: Diagnosis not present

## 2018-10-24 DIAGNOSIS — E1169 Type 2 diabetes mellitus with other specified complication: Secondary | ICD-10-CM | POA: Diagnosis not present

## 2018-10-24 NOTE — Telephone Encounter (Signed)
1st attempt: Left detail message for pt to call back and schedule appt for surgical clearance.

## 2018-10-25 DIAGNOSIS — Z7984 Long term (current) use of oral hypoglycemic drugs: Secondary | ICD-10-CM | POA: Diagnosis not present

## 2018-10-25 DIAGNOSIS — N182 Chronic kidney disease, stage 2 (mild): Secondary | ICD-10-CM | POA: Diagnosis not present

## 2018-10-25 DIAGNOSIS — E1121 Type 2 diabetes mellitus with diabetic nephropathy: Secondary | ICD-10-CM | POA: Diagnosis not present

## 2018-10-25 DIAGNOSIS — E7800 Pure hypercholesterolemia, unspecified: Secondary | ICD-10-CM | POA: Diagnosis not present

## 2018-10-25 NOTE — Telephone Encounter (Signed)
Pt has been scheduled to see Kerin Ransom, NP, 10/28/18 @ 3:00 with understanding to arrive 15 mins early for registration.  Pt thanked me for the call.

## 2018-10-25 NOTE — Telephone Encounter (Signed)
Okay to interrupt antiplatelet therapy for upcoming gastric sleeve operation

## 2018-10-28 ENCOUNTER — Ambulatory Visit (INDEPENDENT_AMBULATORY_CARE_PROVIDER_SITE_OTHER): Payer: Medicare Other | Admitting: Cardiology

## 2018-10-28 ENCOUNTER — Encounter: Payer: Self-pay | Admitting: Cardiology

## 2018-10-28 DIAGNOSIS — E785 Hyperlipidemia, unspecified: Secondary | ICD-10-CM | POA: Diagnosis not present

## 2018-10-28 DIAGNOSIS — I252 Old myocardial infarction: Secondary | ICD-10-CM | POA: Diagnosis not present

## 2018-10-28 DIAGNOSIS — J449 Chronic obstructive pulmonary disease, unspecified: Secondary | ICD-10-CM | POA: Diagnosis not present

## 2018-10-28 DIAGNOSIS — Z9861 Coronary angioplasty status: Secondary | ICD-10-CM

## 2018-10-28 DIAGNOSIS — E119 Type 2 diabetes mellitus without complications: Secondary | ICD-10-CM

## 2018-10-28 DIAGNOSIS — Z01810 Encounter for preprocedural cardiovascular examination: Secondary | ICD-10-CM | POA: Diagnosis not present

## 2018-10-28 DIAGNOSIS — G4733 Obstructive sleep apnea (adult) (pediatric): Secondary | ICD-10-CM | POA: Diagnosis not present

## 2018-10-28 DIAGNOSIS — I2510 Atherosclerotic heart disease of native coronary artery without angina pectoris: Secondary | ICD-10-CM

## 2018-10-28 DIAGNOSIS — I10 Essential (primary) hypertension: Secondary | ICD-10-CM | POA: Diagnosis not present

## 2018-10-28 DIAGNOSIS — Z853 Personal history of malignant neoplasm of breast: Secondary | ICD-10-CM | POA: Insufficient documentation

## 2018-10-28 NOTE — Assessment & Plan Note (Signed)
moderate with AHI 19/hr now on BIPAP auto

## 2018-10-28 NOTE — Assessment & Plan Note (Signed)
Inferior STEMI March 2015

## 2018-10-28 NOTE — Assessment & Plan Note (Signed)
Inferior STEMI march 2015- S/P PCI Mid RCA PCI Promus DES 3.0 mm x 12 mm (3.5 mm)

## 2018-10-28 NOTE — Assessment & Plan Note (Signed)
Followed by Dr Sood 

## 2018-10-28 NOTE — Assessment & Plan Note (Signed)
LDL 57 after the addition of Zetia to statin Rx

## 2018-10-28 NOTE — Assessment & Plan Note (Signed)
See today for per op clearance prior to bariatric surgery

## 2018-10-28 NOTE — Progress Notes (Signed)
10/28/2018 Mariah Kelley   [birth year]  [id number]  Primary Physician Jonathon Jordan, MD Primary Cardiologist: Dr Gwenlyn Found  HPI: The patient is a pleasant 67 year old female with a history of coronary disease.  In March 2015 she presented with an inferior MI.  She essentially presented with nausea and vomiting.  She was taken to the Cath Lab by Dr. Alvester Chou.  She had an RCA DES placed, the remainder of her coronaries were without significant obstruction.  Her LV function is preserved.  She is done very well from that standpoint since.  She has morbid obesity with a BMI of 42.  She also has significant sleep apnea and is on BiPAP, and she is also been diagnosed with non-insulin-dependent diabetes recently.  She is here for preop clearance prior to bariatric surgery.  The patient exercises with the physical therapist 3-5 times a week.  She does various cardio including exercise bike, treadmill, and swimming.  She denies any chest pain.  She has not used any nitroglycerin.  She has no unusual shortness of breath.   Current Outpatient Medications  Medication Sig Dispense Refill  . albuterol (PROVENTIL HFA;VENTOLIN HFA) 108 (90 Base) MCG/ACT inhaler Inhale 2 puffs into the lungs every 6 (six) hours as needed for wheezing or shortness of breath. 1 Inhaler 2  . amLODipine (NORVASC) 5 MG tablet Take 5 mg by mouth daily.    [name] anastrozole (ARIMIDEX) 1 MG tablet Take 1 tablet (1 mg total) by mouth daily. 90 tablet 3  . aspirin EC 81 MG tablet Take 81 mg by mouth daily.    [name] atorvastatin (LIPITOR) 80 MG tablet TAKE 1 TABLET (80 MG TOTAL) BY MOUTH DAILY AT 6 PM. 30 tablet 10  . calcium carbonate (CALCIUM 600) 600 MG TABS tablet Take 600 mg by mouth 2 (two) times daily with a meal.    . Cholecalciferol (VITAMIN D3) 2000 units TABS Take 2,000 Units by mouth daily.    . clopidogrel (PLAVIX) 75 MG tablet TAKE 1 TABLET BY MOUTH EVERY DAY 30 tablet 9  . ezetimibe (ZETIA) 10 MG tablet Take 1 tablet (10 mg  total) by mouth daily. 90 tablet 3  . losartan (COZAAR) 100 MG tablet Take 100 mg by mouth daily.    . metFORMIN (GLUCOPHAGE) 500 MG tablet Take 500 mg by mouth 2 (two) times daily with a meal.    . metoprolol tartrate (LOPRESSOR) 50 MG tablet TAKE 1 TABLET (50 MG TOTAL) BY MOUTH 2 (TWO) TIMES DAILY. 60 tablet 10  . nitroGLYCERIN (NITROSTAT) 0.4 MG SL tablet PLACE 1 TABLET UNDER THE TONGUE EVERY 5 MINUTES AS NEEDED FOR CHEST PAIN. 25 tablet 1  . Spacer/Aero-Holding Chambers (AEROCHAMBER MV) inhaler Use as instructed 1 each 0  . SYMBICORT 160-4.5 MCG/ACT inhaler TAKE 2 PUFFS BY MOUTH TWICE A DAY 10.2 Inhaler 5   No current facility-administered medications for this visit.     Allergies  Allergen Reactions  . Prednisone Other (See Comments)    "made poison ivy worse", rebound effect when take prednisone  . Codeine Nausea Only and Other (See Comments)    HEADACHE    Past Medical History:  Diagnosis Date  . Arthritis   . Breast cancer of upper-outer quadrant of right female breast ([place]) 12/23/2014  . Bruises easily   . CAD S/P percutaneous coronary angioplasty 01/17/2014   takes Brilinta and Plavix daily  . Cataracts, bilateral   . Diabetes mellitus without complication ([place])   . History of bronchitis 7-22yrs  ago  . History of tobacco abuse    40 pack-years  . Hyperlipidemia    takes Lipitor daily  . Hypertension    takes Metoprolol daily  . Joint pain   . Obesity   . OSA treated with BiPAP    moderate with AHI 19/hr now on BIPAP auto  . ST elevation myocardial infarction (STEMI) of inferolateral wall ([place]) 01/17/2014    Social History   Socioeconomic History  . Marital status: Single    Spouse name: Not on file  . Number of children: Not on file  . Years of education: Not on file  . Highest education level: Not on file  Occupational History  . Occupation: Journalist, newspaper  . Financial resource strain: Not on file  . Food insecurity:    Worry: Not on file     Inability: Not on file  . Transportation needs:    Medical: Not on file    Non-medical: Not on file  Tobacco Use  . Smoking status: Former Smoker    Packs/day: 1.00    Years: 40.00    Pack years: 40.00    Types: Cigarettes    Last attempt to quit: 07/30/2009    Years since quitting: 9.2  . Smokeless tobacco: Former Systems developer  . Tobacco comment: quit 5 yrs ago  Substance and Sexual Activity  . Alcohol use: No  . Drug use: No  . Sexual activity: Never    Birth control/protection: Surgical  Lifestyle  . Physical activity:    Days per week: Not on file    Minutes per session: Not on file  . Stress: Not on file  Relationships  . Social connections:    Talks on phone: Not on file    Gets together: Not on file    Attends religious service: Not on file    Active member of club or organization: Not on file    Attends meetings of clubs or organizations: Not on file    Relationship status: Not on file  . Intimate partner violence:    Fear of current or ex partner: Not on file    Emotionally abused: Not on file    Physically abused: Not on file    Forced sexual activity: Not on file  Other Topics Concern  . Not on file  Social History Narrative   Divorced, 2 children.      Family History  Problem Relation Age of Onset  . Heart attack Mother 5       passed at 53  . Heart attack Father 103       s/p CABG, multiple PCIs  . CAD Brother        Still living  . CAD Brother        Still living     Review of Systems: General: negative for chills, fever, night sweats or weight changes.  Cardiovascular: negative for chest pain, dyspnea on exertion, edema, orthopnea, palpitations, paroxysmal nocturnal dyspnea or shortness of breath Dermatological: negative for rash Respiratory: negative for cough or wheezing Urologic: negative for hematuria Abdominal: negative for nausea, vomiting, diarrhea, bright red blood per rectum, melena, or hematemesis Neurologic: negative for visual changes,  syncope, or dizziness All other systems reviewed and are otherwise negative except as noted above.    Blood pressure (!) 164/72, pulse 78, height 5' 8.5" (1.74 m), weight 282 lb (127.9 kg).  General appearance: alert, cooperative, no distress and morbidly obese Lungs: clear to auscultation bilaterally Heart: regular  rate and rhythm Extremities: no edema Skin: warm and dry Neurologic: Grossly normal  EKG NSR- 78  ASSESSMENT AND PLAN:   Pre-operative cardiovascular examination See today for per op clearance prior to bariatric surgery  History of ST elevation myocardial infarction (STEMI) Inferior STEMI March 2015  CAD- S/P PCI 2015 Inferior STEMI march 2015- S/P PCI Mid RCA PCI Promus DES 3.0 mm x 12 mm (3.5 mm)  Essential hypertension Fair control  Non-insulin dependent type 2 diabetes mellitus ([place]) Now on Glucophage  Morbid obesity (HCC) BMI 42- to have bariatric surgery  OSA treated with BiPAP moderate with AHI 19/hr now on BIPAP auto  History of breast cancer S/P chemotherapy and surgery 2016- 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 Based on ACC/AHA guidelines, Mariah Kelley would be at acceptable risk for the planned procedure without further cardiovascular testing.   OK to hold Plavix 5 days pre op and ASA 3 days pre op if needed  I will route this recommendation to the requesting party via [facility] fax function and remove from pre-op pool.  Please call with questions.  Kerin Ransom, PA-C 10/28/2018, 3:19 PM

## 2018-10-28 NOTE — Assessment & Plan Note (Signed)
BMI 42- to have bariatric surgery

## 2018-10-28 NOTE — Assessment & Plan Note (Signed)
S/P chemotherapy and surgery 2016- she has osteoporosis secondary to chemo

## 2018-10-28 NOTE — Patient Instructions (Signed)
Medication Instructions:  NOT NEEDED If you need a refill on your cardiac medications before your next appointment, please call your pharmacy.   Lab work:  NOT NEEDED  If you have labs (blood work) drawn today and your tests are completely normal, you will receive your results only by: [name] MyChart Message (if you have MyChart) OR . A paper copy in the mail If you have any lab test that is abnormal or we need to change your treatment, we will call you to review the results.  Testing/Procedures: NOT NEEDED  Follow-Up: At [facility], you and your health needs are our priority.  As part of our continuing mission to provide you with exceptional heart care, we have created designated Provider Care Teams.  These Care Teams include your primary Cardiologist (physician) and Advanced Practice Providers (APPs -  Physician Assistants and Nurse Practitioners) who all work together to provide you with the care you need, when you need it. You will need a follow up appointment in 6 months.  Please call our office 2 months in advance to schedule this appointment.  You may see Quay Burow, MD or one of the following Advanced Practice Providers on your designated Care Team:   Kerin Ransom, PA-C Roby Lofts, [place] . Sande Rives, PA-C  Any Other Special Instructions Will Be Listed Below (If Applicable).   YOU DO HAVE CARDIAC CLEARANCE FOR UPCOMING SURGERY - LETTER WILL BF FAXED TO SURGEON'S OFFICE

## 2018-10-28 NOTE — Assessment & Plan Note (Signed)
Fair control.

## 2018-10-28 NOTE — Assessment & Plan Note (Signed)
Now on Glucophage

## 2018-11-04 DIAGNOSIS — F4323 Adjustment disorder with mixed anxiety and depressed mood: Secondary | ICD-10-CM | POA: Diagnosis not present

## 2018-11-14 DIAGNOSIS — E119 Type 2 diabetes mellitus without complications: Secondary | ICD-10-CM | POA: Diagnosis not present

## 2018-11-14 DIAGNOSIS — Z961 Presence of intraocular lens: Secondary | ICD-10-CM | POA: Diagnosis not present

## 2018-12-06 DIAGNOSIS — F4323 Adjustment disorder with mixed anxiety and depressed mood: Secondary | ICD-10-CM | POA: Diagnosis not present

## 2018-12-17 DIAGNOSIS — Z112 Encounter for screening for other bacterial diseases: Secondary | ICD-10-CM | POA: Diagnosis not present

## 2018-12-17 DIAGNOSIS — Z01818 Encounter for other preprocedural examination: Secondary | ICD-10-CM | POA: Diagnosis not present

## 2018-12-17 DIAGNOSIS — Z01812 Encounter for preprocedural laboratory examination: Secondary | ICD-10-CM | POA: Diagnosis not present

## 2018-12-20 DIAGNOSIS — M5410 Radiculopathy, site unspecified: Secondary | ICD-10-CM | POA: Diagnosis not present

## 2018-12-27 DIAGNOSIS — Z01818 Encounter for other preprocedural examination: Secondary | ICD-10-CM | POA: Diagnosis not present

## 2018-12-27 DIAGNOSIS — Z6841 Body Mass Index (BMI) 40.0 and over, adult: Secondary | ICD-10-CM | POA: Diagnosis not present

## 2018-12-31 DIAGNOSIS — J449 Chronic obstructive pulmonary disease, unspecified: Secondary | ICD-10-CM | POA: Diagnosis present

## 2018-12-31 DIAGNOSIS — Z87891 Personal history of nicotine dependence: Secondary | ICD-10-CM | POA: Diagnosis not present

## 2018-12-31 DIAGNOSIS — E785 Hyperlipidemia, unspecified: Secondary | ICD-10-CM | POA: Diagnosis present

## 2018-12-31 DIAGNOSIS — Z8249 Family history of ischemic heart disease and other diseases of the circulatory system: Secondary | ICD-10-CM | POA: Diagnosis not present

## 2018-12-31 DIAGNOSIS — Z9842 Cataract extraction status, left eye: Secondary | ICD-10-CM | POA: Diagnosis not present

## 2018-12-31 DIAGNOSIS — Z6841 Body Mass Index (BMI) 40.0 and over, adult: Secondary | ICD-10-CM | POA: Diagnosis not present

## 2018-12-31 DIAGNOSIS — M180 Bilateral primary osteoarthritis of first carpometacarpal joints: Secondary | ICD-10-CM | POA: Diagnosis not present

## 2018-12-31 DIAGNOSIS — Z7902 Long term (current) use of antithrombotics/antiplatelets: Secondary | ICD-10-CM | POA: Diagnosis not present

## 2018-12-31 DIAGNOSIS — Z7982 Long term (current) use of aspirin: Secondary | ICD-10-CM | POA: Diagnosis not present

## 2018-12-31 DIAGNOSIS — Z7984 Long term (current) use of oral hypoglycemic drugs: Secondary | ICD-10-CM | POA: Diagnosis not present

## 2018-12-31 DIAGNOSIS — Z981 Arthrodesis status: Secondary | ICD-10-CM | POA: Diagnosis not present

## 2018-12-31 DIAGNOSIS — Z79899 Other long term (current) drug therapy: Secondary | ICD-10-CM | POA: Diagnosis not present

## 2018-12-31 DIAGNOSIS — C50411 Malignant neoplasm of upper-outer quadrant of right female breast: Secondary | ICD-10-CM | POA: Diagnosis not present

## 2018-12-31 DIAGNOSIS — M65341 Trigger finger, right ring finger: Secondary | ICD-10-CM | POA: Diagnosis not present

## 2018-12-31 DIAGNOSIS — E119 Type 2 diabetes mellitus without complications: Secondary | ICD-10-CM | POA: Diagnosis present

## 2018-12-31 DIAGNOSIS — Z9841 Cataract extraction status, right eye: Secondary | ICD-10-CM | POA: Diagnosis not present

## 2018-12-31 DIAGNOSIS — I10 Essential (primary) hypertension: Secondary | ICD-10-CM | POA: Diagnosis present

## 2018-12-31 DIAGNOSIS — I2510 Atherosclerotic heart disease of native coronary artery without angina pectoris: Secondary | ICD-10-CM | POA: Diagnosis not present

## 2018-12-31 DIAGNOSIS — Z9011 Acquired absence of right breast and nipple: Secondary | ICD-10-CM | POA: Diagnosis not present

## 2018-12-31 DIAGNOSIS — Z90710 Acquired absence of both cervix and uterus: Secondary | ICD-10-CM | POA: Diagnosis not present

## 2018-12-31 DIAGNOSIS — G4733 Obstructive sleep apnea (adult) (pediatric): Secondary | ICD-10-CM | POA: Diagnosis not present

## 2018-12-31 DIAGNOSIS — Z955 Presence of coronary angioplasty implant and graft: Secondary | ICD-10-CM | POA: Diagnosis not present

## 2018-12-31 DIAGNOSIS — Z961 Presence of intraocular lens: Secondary | ICD-10-CM | POA: Diagnosis present

## 2018-12-31 DIAGNOSIS — M79641 Pain in right hand: Secondary | ICD-10-CM | POA: Diagnosis not present

## 2019-01-03 ENCOUNTER — Other Ambulatory Visit: Payer: Self-pay | Admitting: Pulmonary Disease

## 2019-01-07 ENCOUNTER — Other Ambulatory Visit: Payer: Self-pay | Admitting: Cardiovascular Disease

## 2019-01-07 DIAGNOSIS — Z1231 Encounter for screening mammogram for malignant neoplasm of breast: Secondary | ICD-10-CM | POA: Diagnosis not present

## 2019-01-07 DIAGNOSIS — Z853 Personal history of malignant neoplasm of breast: Secondary | ICD-10-CM | POA: Diagnosis not present

## 2019-01-07 NOTE — Telephone Encounter (Signed)
Called and spoke with Marjory Lies, [facility]. He stated there was no PA required for Symbicort. He stated that Patient needed a new prescription only.  New Prescription for Symbicort sent to pharmacy.  Patient is scheduled with Dr Halford Chessman, 01/02/19.  Message sent to Patient.  Nothing further at this time.

## 2019-01-10 DIAGNOSIS — M545 Low back pain: Secondary | ICD-10-CM | POA: Diagnosis not present

## 2019-01-10 DIAGNOSIS — Z981 Arthrodesis status: Secondary | ICD-10-CM | POA: Diagnosis not present

## 2019-01-14 ENCOUNTER — Other Ambulatory Visit: Payer: Self-pay | Admitting: Family Medicine

## 2019-01-14 DIAGNOSIS — Z981 Arthrodesis status: Secondary | ICD-10-CM

## 2019-01-14 DIAGNOSIS — M545 Low back pain, unspecified: Secondary | ICD-10-CM

## 2019-01-19 ENCOUNTER — Other Ambulatory Visit: Payer: Self-pay

## 2019-01-19 ENCOUNTER — Ambulatory Visit
Admission: RE | Admit: 2019-01-19 | Discharge: 2019-01-19 | Disposition: A | Discharge status: Home / Self Care | Payer: Medicare Other | Source: Ambulatory Visit | Attending: Family Medicine | Admitting: Family Medicine

## 2019-01-19 DIAGNOSIS — M5116 Intervertebral disc disorders with radiculopathy, lumbar region: Secondary | ICD-10-CM | POA: Diagnosis not present

## 2019-01-19 DIAGNOSIS — Z981 Arthrodesis status: Secondary | ICD-10-CM

## 2019-01-19 DIAGNOSIS — M545 Low back pain, unspecified: Secondary | ICD-10-CM

## 2019-01-28 DIAGNOSIS — F4323 Adjustment disorder with mixed anxiety and depressed mood: Secondary | ICD-10-CM | POA: Diagnosis not present

## 2019-01-29 DIAGNOSIS — Z713 Dietary counseling and surveillance: Secondary | ICD-10-CM | POA: Diagnosis not present

## 2019-01-29 DIAGNOSIS — Z903 Acquired absence of stomach [part of]: Secondary | ICD-10-CM | POA: Diagnosis not present

## 2019-01-30 ENCOUNTER — Inpatient Hospital Stay: Payer: Medicare Other | Attending: Hematology and Oncology | Admitting: Hematology and Oncology

## 2019-01-30 ENCOUNTER — Other Ambulatory Visit: Payer: Self-pay | Admitting: Pulmonary Disease

## 2019-01-30 DIAGNOSIS — Z170 Estrogen receptor positive status [ER+]: Secondary | ICD-10-CM | POA: Diagnosis not present

## 2019-01-30 DIAGNOSIS — C50411 Malignant neoplasm of upper-outer quadrant of right female breast: Secondary | ICD-10-CM | POA: Diagnosis not present

## 2019-01-30 MED ORDER — ANASTROZOLE 1 MG PO TABS: 1.0000 mg | ORAL_TABLET | Freq: Every day | ORAL | 3 refills | Status: DC

## 2019-01-30 NOTE — Assessment & Plan Note (Signed)
Right breast mastectomy 01/27/2015: 2.2 cm grade 2 invasive ductal carcinoma ER 100%, PR 0%, HER-2 negative, Ki-67 21%, margins negative, sentinel lymph nodes negative Oncotype score 25; 16 % risk of recurrence with tamoxifen alone; status post adjuvant chemotherapy with Taxotere and Cytoxan started 03/03/2015 completed 05/04/2015, started anastrozole 06/07/2015  Anastrozole toxicities: No major side effects anastrozole. 1. Minimal hot flashes. 2. Denies any myalgias.  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 Breast exam3/28/2019: Benign 2. Mammograms  February 2020 at [facility]: Benign  Bone density September 2019: T score -1.6: Osteopenia, her T score on the spine is +2.2.  She is increased her weightbearing exercises and taking extra calcium.  Return to clinic in1 yearfor follow-up
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
## 2019-02-12 DIAGNOSIS — M48061 Spinal stenosis, lumbar region without neurogenic claudication: Secondary | ICD-10-CM | POA: Diagnosis not present

## 2019-02-12 DIAGNOSIS — M5416 Radiculopathy, lumbar region: Secondary | ICD-10-CM | POA: Diagnosis not present

## 2019-02-12 DIAGNOSIS — M5126 Other intervertebral disc displacement, lumbar region: Secondary | ICD-10-CM | POA: Diagnosis not present

## 2019-02-12 DIAGNOSIS — M545 Low back pain: Secondary | ICD-10-CM | POA: Diagnosis not present

## 2019-02-16 ENCOUNTER — Other Ambulatory Visit: Payer: Self-pay | Admitting: Cardiovascular Disease

## 2019-02-21 DIAGNOSIS — F4323 Adjustment disorder with mixed anxiety and depressed mood: Secondary | ICD-10-CM | POA: Diagnosis not present

## 2019-02-26 ENCOUNTER — Other Ambulatory Visit: Payer: Self-pay | Admitting: Pulmonary Disease

## 2019-03-03 ENCOUNTER — Ambulatory Visit: Payer: Medicare Other | Admitting: Pulmonary Disease

## 2019-03-05 ENCOUNTER — Other Ambulatory Visit: Payer: Self-pay | Admitting: Pulmonary Disease

## 2019-03-21 DIAGNOSIS — F4323 Adjustment disorder with mixed anxiety and depressed mood: Secondary | ICD-10-CM | POA: Diagnosis not present

## 2019-03-29 ENCOUNTER — Other Ambulatory Visit: Payer: Self-pay | Admitting: Pulmonary Disease

## 2019-04-02 DIAGNOSIS — Z9884 Bariatric surgery status: Secondary | ICD-10-CM | POA: Diagnosis not present

## 2019-04-02 DIAGNOSIS — D649 Anemia, unspecified: Secondary | ICD-10-CM | POA: Diagnosis not present

## 2019-04-02 DIAGNOSIS — K912 Postsurgical malabsorption, not elsewhere classified: Secondary | ICD-10-CM | POA: Diagnosis not present

## 2019-04-02 DIAGNOSIS — T148XXA Other injury of unspecified body region, initial encounter: Secondary | ICD-10-CM | POA: Diagnosis not present

## 2019-04-02 DIAGNOSIS — Z7984 Long term (current) use of oral hypoglycemic drugs: Secondary | ICD-10-CM | POA: Diagnosis not present

## 2019-04-11 DIAGNOSIS — Z903 Acquired absence of stomach [part of]: Secondary | ICD-10-CM | POA: Diagnosis not present

## 2019-04-11 DIAGNOSIS — Z713 Dietary counseling and surveillance: Secondary | ICD-10-CM | POA: Diagnosis not present

## 2019-04-15 DIAGNOSIS — Z20828 Contact with and (suspected) exposure to other viral communicable diseases: Secondary | ICD-10-CM | POA: Diagnosis not present

## 2019-04-17 DIAGNOSIS — F4323 Adjustment disorder with mixed anxiety and depressed mood: Secondary | ICD-10-CM | POA: Diagnosis not present

## 2019-04-21 ENCOUNTER — Telehealth: Payer: Self-pay | Admitting: *Deleted

## 2019-04-21 NOTE — Telephone Encounter (Signed)

## 2019-04-21 NOTE — Telephone Encounter (Signed)
Spoke with patient and she agreed to reschedule her appointment with Dr. Gwenlyn Found to April 24, 2019 at 4:30 PM.

## 2019-04-22 ENCOUNTER — Ambulatory Visit: Payer: Medicare Other | Admitting: Cardiovascular Disease

## 2019-04-24 ENCOUNTER — Telehealth (INDEPENDENT_AMBULATORY_CARE_PROVIDER_SITE_OTHER): Payer: Medicare Other | Admitting: Cardiovascular Disease

## 2019-04-24 ENCOUNTER — Telehealth: Payer: Self-pay

## 2019-04-24 DIAGNOSIS — G4733 Obstructive sleep apnea (adult) (pediatric): Secondary | ICD-10-CM

## 2019-04-24 DIAGNOSIS — E785 Hyperlipidemia, unspecified: Secondary | ICD-10-CM

## 2019-04-24 DIAGNOSIS — I2510 Atherosclerotic heart disease of native coronary artery without angina pectoris: Secondary | ICD-10-CM | POA: Diagnosis not present

## 2019-04-24 DIAGNOSIS — I252 Old myocardial infarction: Secondary | ICD-10-CM | POA: Diagnosis not present

## 2019-04-24 DIAGNOSIS — Z9861 Coronary angioplasty status: Secondary | ICD-10-CM

## 2019-04-24 DIAGNOSIS — I10 Essential (primary) hypertension: Secondary | ICD-10-CM | POA: Diagnosis not present

## 2019-04-24 NOTE — Telephone Encounter (Signed)
lmtcb on pt mobile number to discuss 6/18 AVS instructions.  Letter including After Visit Summary and any other necessary documents to be mailed to the patient's address on file.

## 2019-04-24 NOTE — Progress Notes (Signed)
Virtual Visit via Video Note   This visit type was conducted due to national recommendations for restrictions regarding the COVID-19 Pandemic (e.g. social distancing) in an effort to limit this patient's exposure and mitigate transmission in our community.  Due to her co-morbid illnesses, this patient is at least at moderate risk for complications without adequate follow up.  This format is felt to be most appropriate for this patient at this time.  All issues noted in this document were discussed and addressed.  A limited physical exam was performed with this format.  Please refer to the patient's chart for her consent to telehealth for [facility].   Date:  04/24/2019   ID:  Mariah Kelley, DOB 1951/08/24, MRN [id number]  Patient Location: Home Provider Location: Home  PCP:  Jonathon Jordan, MD  Cardiologist:  Quay Burow, MD  Electrophysiologist:  None   Evaluation Performed:  Follow-Up Visit  Chief Complaint: Follow-up CAD, hypertension hyperlipidemia  History of Present Illness:    Mariah Kelley is a 68 y.o.  w/ PMHx s/f HTN, HLD, prior tobacco abuse, obesity and family h/o premature CAD I last saw her in the office  04/02/2018.[name] She was admitted to [facility] today for STEMI 01/17/14. She was in her USOH until ~2AM this morning when she experienced sudden onset substernal chest pressure radiating to her neck and arms bilaterally w/ associated nausea and vomiting. The discomfort persisted through the morning. She took an ASA 81mg  ~0600 w/ minimal relief. When the discomfort intensified to a 7-8/10, she sought medical care.  She has no prior cardiac history. She reports smoking 1 PPD x 40 years, no current tobacco abuse. Mother with MI in her 36s, father with MI at 13, brothers w/ CAD. Previously on medication for high cholesterol. No current medications.  In the ED, EKG revealed NSR w/ 76mm ST elevations in II, III, aVF and V5, V6 meeting diagnostic criteria  for an inferolateral STEMI. Code STEMI was called, and she was transported emergently to the cardiac cath lab. I placed a drug-eluting stent in her dominant mid RCA for a hazy 80% plaque. The remainder of her coronary anatomy was free of significant disease in her function was preserved. She was discharged in stable condition. Since that time she has seen [place] PA-C in the office in followup. She does deny chest pain or shortness of breath. She has lost 40 pounds as made lifestyle modifications.since I saw her back in the office a year ago she's remained clinicallystable specifically denying chest pain or shortness of breath.  She was diagnosed with breast cancer underwent mastectomy one year ago and chemotherapy subsequent to that. She is currently cancer free.She exercises 4 times a week but unfortunately continues to gain weight. She had her left shoulder replaced approximately 1 year ago.  Since I saw her a year ago she is remained stable.  She underwent successful bariatric surgery on 12/31/2018 by Dr. Toney Rakes at [facility].  She dropped 308 down to 221 pounds and feels clinically improved.  She denies chest pain or shortness of breath.  The patient does not have symptoms concerning for COVID-19 infection (fever, chills, cough, or new shortness of breath).    Past Medical History:  Diagnosis Date  . Arthritis   . Breast cancer of upper-outer quadrant of right female breast ([place]) 12/23/2014  . Bruises easily   . CAD S/P percutaneous coronary angioplasty 01/17/2014   takes Brilinta and Plavix daily  .  Cataracts, bilateral   . Diabetes mellitus without complication ([place])   . History of bronchitis 7-99yrs ago  . History of tobacco abuse    40 pack-years  . Hyperlipidemia    takes Lipitor daily  . Hypertension    takes Metoprolol daily  . Joint pain   . Obesity   . OSA treated with BiPAP    moderate with AHI 19/hr now on BIPAP auto  . ST elevation  myocardial infarction (STEMI) of inferolateral wall ([place]) 01/17/2014   Past Surgical History:  Procedure Laterality Date  . ABDOMINAL HYSTERECTOMY    . APPENDECTOMY    . BREAST REDUCTION SURGERY Left 01/27/2015  . BREAST REDUCTION SURGERY Left 01/27/2015   Procedure: MAMMARY REDUCTION  LEFT (BREAST);  Surgeon: Irene Limbo, MD;  Location: [facility];  Service: Plastics;  Laterality: Left;  . CATARACT EXTRACTION W/PHACO Right 04/11/2016   Procedure: CATARACT EXTRACTION PHACO AND INTRAOCULAR LENS PLACEMENT (IOC);  Surgeon: Rutherford Guys, MD;  Location: [facility];  Service: Ophthalmology;  Laterality: Right;  CDE: 14.15  . CATARACT EXTRACTION W/PHACO Left 05/02/2016   Procedure: CATARACT EXTRACTION PHACO AND INTRAOCULAR LENS PLACEMENT (IOC);  Surgeon: Rutherford Guys, MD;  Location: [facility];  Service: Ophthalmology;  Laterality: Left;  CDE: 12.11  . CHOLECYSTECTOMY    . COLONOSCOPY    . LEFT HEART CATH Bilateral 01/17/2014   Procedure: LEFT HEART CATH;  Surgeon: Lorretta Harp, MD;  Location[facility] CATH LAB;  Service: Cardiovascular;  Laterality: Bilateral;  . MASTECTOMY W/ SENTINEL NODE BIOPSY Right 01/27/2015   dr Lucia Gaskins   . MASTECTOMY W/ SENTINEL NODE BIOPSY Right 01/27/2015   Procedure: RIGHT MASTECTOMY WITH RIGHT AXILLARY  SENTINEL LYMPH NODE BIOPSY;  Surgeon: Alphonsa Overall, MD;  Location: [place];  Service: General;  Laterality: Right;  . PERCUTANEOUS CORONARY STENT INTERVENTION (PCI-S)    . PORTACATH PLACEMENT Left 03/23/2015   Procedure: INSERTION PORT-A-CATH ;  Surgeon: Alphonsa Overall, MD;  Location: [facility];  Service: General;  Laterality: Left;  . SPINAL FUSION    . TOTAL SHOULDER ARTHROPLASTY Left 04/19/2017   Procedure: TOTAL SHOULDER ARTHROPLASTY;  Surgeon: Tania Ade, MD;  Location: [place];  Service: Orthopedics;  Laterality: Left;  Left total shoulder arthroplasty  . TUBAL LIGATION       No outpatient medications have been marked as taking for the 04/24/19 encounter (Appointment) with Lorretta Harp, MD.     Allergies:   Prednisone and Codeine   Social History   Tobacco Use  . Smoking status: Former Smoker    Packs/day: 1.00    Years: 40.00    Pack years: 40.00    Types: Cigarettes    Quit date: 07/30/2009    Years since quitting: 9.7  . Smokeless tobacco: Former Systems developer  . Tobacco comment: quit 5 yrs ago  Substance Use Topics  . Alcohol use: No  . Drug use: No     Family Hx: The patient's family history includes CAD in her brother and brother; Heart attack (age of onset: 66) in her mother; Heart attack (age of onset: 68) in her father.  ROS:   Please see the history of present illness.     All other systems reviewed and are negative.   Prior CV studies:   The following studies were reviewed today:  None  Labs/Other Tests and Data Reviewed:    EKG:  No ECG reviewed.  Recent Labs: 10/16/2018: ALT 23   Recent Lipid Panel Lab Results  Component Value Date/Time  CHOL 132 10/16/2018 08:22 AM   TRIG 120 10/16/2018 08:22 AM   HDL 51 10/16/2018 08:22 AM   CHOLHDL 2.6 10/16/2018 08:22 AM   CHOLHDL 4.7 01/18/2014 03:14 AM   LDLCALC 57 10/16/2018 08:22 AM    Wt Readings from Last 3 Encounters:  10/28/18 282 lb (127.9 kg)  04/02/18 (!) 301 lb 9.6 oz (136.8 kg)  02/27/18 295 lb (133.8 kg)     Objective:    Vital Signs:  There were no vitals taken for this visit.   VITAL SIGNS:  reviewed GEN:  no acute distress RESPIRATORY:  normal respiratory effort, symmetric expansion NEURO:  alert and oriented x 3, no obvious focal deficit PSYCH:  normal affect  ASSESSMENT & PLAN:    1. Coronary artery disease- history of CAD status post inferior STEMI 01/17/2014 treated with RCA PCI and drug-eluting stenting by myself.  Her LV function was preserved.  She had minimal disease in her other vessels.  She has had no recurrent symptoms and has been on dual antiplatelet therapy including aspirin and Plavix.  I have agreed to stop her Plavix. 2. Hyperlipidemia-  history of hyperlipidemia on atorvastatin and Zetia.  Lipid profile performed 10/16/2018 revealed total cholesterol 132, LDL 57 and HDL 51.  We have agreed to stop her Zetia and recheck a lipid liver profile. 3. Essential hypertension-history of essential hypertension with blood pressure measured today 117/72.  She is on metoprolol 4. Obstructive sleep apnea-on CPAP 5. Morbid obesity-status post bariatric surgery at [facility] 12/31/2018 with a 80 pound weight loss.  COVID-19 Education: The signs and symptoms of COVID-19 were discussed with the patient and how to seek care for testing (follow up with PCP or arrange E-visit).  The importance of social distancing was discussed today.  Time:   Today, I have spent 8 minutes with the patient with telehealth technology discussing the above problems.     Medication Adjustments/Labs and Tests Ordered: Current medicines are reviewed at length with the patient today.  Concerns regarding medicines are outlined above.   Tests Ordered: No orders of the defined types were placed in this encounter.   Medication Changes: No orders of the defined types were placed in this encounter.   Follow Up:  In Person in 1 year(s)  Signed, Quay Burow, MD  04/24/2019 12:05 PM    [place]

## 2019-04-24 NOTE — Patient Instructions (Signed)
Medication Instructions:  Your physician has recommended you make the following change in your medication:   STOP TAKING YOUR PLAVIX.  STOP TAKING YOUR ZETIA.  If you need a refill on your cardiac medications before your next appointment, please call your pharmacy.   Lab work: NONE If you have labs (blood work) drawn today and your tests are completely normal, you will receive your results only by: [name] MyChart Message (if you have MyChart) OR . A paper copy in the mail If you have any lab test that is abnormal or we need to change your treatment, we will call you to review the results.  Testing/Procedures: NONE  Follow-Up: At [facility], you and your health needs are our priority.  As part of our continuing mission to provide you with exceptional heart care, we have created designated Provider Care Teams.  These Care Teams include your primary Cardiologist (physician) and Advanced Practice Providers (APPs -  Physician Assistants and Nurse Practitioners) who all work together to provide you with the care you need, when you need it. You will need a follow up appointment in 12 months with Dr. Gwenlyn Found.  Please call our office 2 months in advance to schedule this appointment.

## 2019-04-25 ENCOUNTER — Other Ambulatory Visit: Payer: Self-pay | Admitting: Pulmonary Disease

## 2019-04-30 DIAGNOSIS — S8012XA Contusion of left lower leg, initial encounter: Secondary | ICD-10-CM | POA: Diagnosis not present

## 2019-04-30 DIAGNOSIS — S0121XA Laceration without foreign body of nose, initial encounter: Secondary | ICD-10-CM | POA: Diagnosis not present

## 2019-04-30 DIAGNOSIS — S8011XA Contusion of right lower leg, initial encounter: Secondary | ICD-10-CM | POA: Diagnosis not present

## 2019-04-30 DIAGNOSIS — Z955 Presence of coronary angioplasty implant and graft: Secondary | ICD-10-CM | POA: Diagnosis not present

## 2019-04-30 DIAGNOSIS — Z96612 Presence of left artificial shoulder joint: Secondary | ICD-10-CM | POA: Diagnosis not present

## 2019-04-30 DIAGNOSIS — M542 Cervicalgia: Secondary | ICD-10-CM | POA: Diagnosis not present

## 2019-04-30 DIAGNOSIS — Z9010 Acquired absence of unspecified breast and nipple: Secondary | ICD-10-CM | POA: Diagnosis not present

## 2019-04-30 DIAGNOSIS — S01511A Laceration without foreign body of lip, initial encounter: Secondary | ICD-10-CM | POA: Diagnosis not present

## 2019-04-30 DIAGNOSIS — R58 Hemorrhage, not elsewhere classified: Secondary | ICD-10-CM | POA: Diagnosis not present

## 2019-04-30 DIAGNOSIS — S80212A Abrasion, left knee, initial encounter: Secondary | ICD-10-CM | POA: Diagnosis not present

## 2019-04-30 DIAGNOSIS — Z9884 Bariatric surgery status: Secondary | ICD-10-CM | POA: Diagnosis not present

## 2019-04-30 DIAGNOSIS — Z9049 Acquired absence of other specified parts of digestive tract: Secondary | ICD-10-CM | POA: Diagnosis not present

## 2019-05-06 DIAGNOSIS — Z4802 Encounter for removal of sutures: Secondary | ICD-10-CM | POA: Diagnosis not present

## 2019-05-07 ENCOUNTER — Other Ambulatory Visit: Payer: Self-pay

## 2019-05-07 ENCOUNTER — Ambulatory Visit (INDEPENDENT_AMBULATORY_CARE_PROVIDER_SITE_OTHER): Payer: Medicare Other | Admitting: Pulmonary Disease

## 2019-05-07 ENCOUNTER — Encounter: Payer: Self-pay | Admitting: Pulmonary Disease

## 2019-05-07 VITALS — BP 138/76 | HR 72 | Temp 98.2°F | Ht 68.5 in | Wt 220.0 lb

## 2019-05-07 DIAGNOSIS — Z9861 Coronary angioplasty status: Secondary | ICD-10-CM | POA: Diagnosis not present

## 2019-05-07 DIAGNOSIS — G4733 Obstructive sleep apnea (adult) (pediatric): Secondary | ICD-10-CM | POA: Diagnosis not present

## 2019-05-07 DIAGNOSIS — I2510 Atherosclerotic heart disease of native coronary artery without angina pectoris: Secondary | ICD-10-CM

## 2019-05-07 DIAGNOSIS — J449 Chronic obstructive pulmonary disease, unspecified: Secondary | ICD-10-CM

## 2019-05-07 DIAGNOSIS — J432 Centrilobular emphysema: Secondary | ICD-10-CM

## 2019-05-07 MED ORDER — ARNUITY ELLIPTA 100 MCG/ACT IN AEPB: 1.0000 | INHALATION_SPRAY | Freq: Every day | RESPIRATORY_TRACT | 0 refills | Status: DC

## 2019-05-07 MED ORDER — ARNUITY ELLIPTA 100 MCG/ACT IN AEPB: 1.0000 | INHALATION_SPRAY | Freq: Every day | RESPIRATORY_TRACT | 5 refills | Status: DC

## 2019-05-07 NOTE — Patient Instructions (Signed)
Stop symbicort  Arnuity one puff daily, and rinse mouth after each use  Will have her your medical supply company adjust your Bipap settings  Follow up in 6 months

## 2019-05-07 NOTE — Progress Notes (Signed)
[place] [facility]  Chief Complaint  Patient presents with  . Follow-up    not wearing cpa mask for last week, bruised nose, stitches, feels pressure needs to be lowered, waking up at night    Constitutional:  BP 138/76 (BP Location: Left Arm, Cuff Size: Normal)   Pulse 72   Temp 98.2 F (36.8 C) (Oral)   Ht 5' 8.5" (1.74 m)   Wt 220 lb (99.8 kg)   SpO2 96%   BMI 32.96 kg/m   Past Medical History:  CAD, HTN, HLD, DM, Cataracts, Breast cancer February 2016, OA  Brief Summary:  Mariah Kelley is a 68 y.o. female former smoker COPD with emphysema and sleep apnea.  She had sleeve gastrectomy in February.  Has lost about 50 lbs.  She is feeling much better.  Has already come of several of her medications.  With weight loss, she feels her Bipap settings are running too high now.  She tripped and fell a few days ago and has some bruising on her face.  As a result she hasn't been able to use Bipap recently.  Prior to that she was using nightly.  No issues with mask fit.  Denies sinus congestion, sore throat, dry mouth, or aerophagia.  Her breathing has been doing well.  She is not having cough, wheeze, sputum, or chest congestion.  She hasn't need to use albuterol recently.  She continues to use symbicort, but more as a matter of routine. Physical Exam:   Appearance - well kempt   ENMT - clear nasal mucosa, midline nasal  septum, no oral exudates, no LAN, trachea midline, high arched palate, MP 3  Respiratory - normal chest wall, normal respiratory effort, no accessory muscle use, no wheeze/rales  CV - s1s2 regular rate and rhythm, no murmurs, no peripheral edema, radial pulses symmetric  GI - soft, non tender, no masses  Lymph - no adenopathy noted in neck and axillary areas  MSK - normal gait  Ext - no cyanosis, clubbing, or joint inflammation noted  Skin - healing areas of ecchymosis around her eyes and bridge of her nse  Neuro -  normal strength, oriented x 3  Psych - normal mood and affect   Assessment/Plan:   COPD with emphysema and asthma. - doing better since weight loss - will change her from symbicort to arnuity 100 one puff daily - continue prn albuterol  Obstructive sleep apnea. - likely improving with weight loss - will change auto Bipap to max IPAP 14 cm H2O, min EPAP 5 cm H2O, Pressure support 4 cm H2O - as she continues to lose weight, might get to a point that we can reassess whether she needs to remain on therapy  History of tobacco abuse. - not eligible for lung cancer screening until she is 5 yrs out from breast cancer therapy >> can plan to resume LDCT screening sometime in 2021  Obesity. - she had sleeve gastrectomy at [facility] in February 2020   Patient Instructions  Stop symbicort  Arnuity one puff daily, and rinse mouth after each use  Will have her your medical supply company adjust your Bipap settings  Follow up in 6 months    Chesley Mires, MD [place] Pager: [phone] 05/07/2019, 10:16 AM  Flow Sheet     Pulmonary tests:  Methacholine challenge 11/24/16 >> FEV1 1.91 (66%), FEV1% 79, + methacholine challenge CT chest lung cancer screen 12/13/16 >> emphysema, atherosclerosis PFT 10/04/17 >> FEV1  1.94 (69%), FEV1% 82, TLC 4.66 (81%), DLCO 80%  Sleep tests:  PSG 05/01/17 >> AHI 19, SpO2 low 62% Bipap titration 06/28/17 >> failed CPAP; Bipap 12/8 cm H2O PSG 08/01/17 > AHI 26.8; auto Bipap ONO with Bipap 10/03/17 >>test time 7 hrs 1 min. Average SpO2 90%, low SpO2 83%. Spent 58 min with SpO2 <88%. Auto Bipap 04/06/19 to 05/05/19 >> used on 25 of 30 nights with average 6 hrs 58 min.  Average AHI 5.4 with median Bipap 13/8 and 95 th percentile Bipap 15/10 cm H2O  Medications:   Allergies as of 05/07/2019      Reactions   Prednisone Other (See Comments)   "made poison ivy worse", rebound effect when take prednisone   Codeine Nausea Only, Other (See  Comments)   HEADACHE      Medication List       Accurate as of May 07, 2019 10:16 AM. If you have any questions, ask your nurse or doctor.        STOP taking these medications   budesonide-formoterol 160-4.5 MCG/ACT inhaler Commonly known as: Symbicort Stopped by: Chesley Mires, MD     TAKE these medications   AeroChamber MV inhaler Use as instructed   albuterol 108 (90 Base) MCG/ACT inhaler Commonly known as: VENTOLIN HFA Inhale 2 puffs into the lungs every 6 (six) hours as needed for wheezing or shortness of breath.   anastrozole 1 MG tablet Commonly known as: ARIMIDEX Take 1 tablet (1 mg total) by mouth daily.   Arnuity Ellipta 100 MCG/ACT Aepb Generic drug: Fluticasone Furoate Inhale 1 puff into the lungs daily. Started by: Chesley Mires, MD   aspirin EC 81 MG tablet Take 81 mg by mouth daily.   atorvastatin 80 MG tablet Commonly known as: LIPITOR TAKE 1 TABLET (80 MG TOTAL) BY MOUTH DAILY AT 6 PM.   Calcium 600 600 MG Tabs tablet Generic drug: calcium carbonate Take 600 mg by mouth 2 (two) times daily with a meal.   metFORMIN 500 MG tablet Commonly known as: GLUCOPHAGE Take 500 mg by mouth 2 (two) times daily with a meal.   metoprolol tartrate 50 MG tablet Commonly known as: LOPRESSOR Take 1 tablet (50 mg total) by mouth 2 (two) times daily.   nitroGLYCERIN 0.4 MG SL tablet Commonly known as: NITROSTAT PLACE 1 TABLET UNDER THE TONGUE EVERY 5 MINUTES AS NEEDED FOR CHEST PAIN.   PRILOSEC PO Take 1 tablet by mouth daily.       Past Surgical History:  She  has a past surgical history that includes Abdominal hysterectomy; Spinal fusion; Appendectomy; Percutaneous coronary stent intervention (pci-s); left heart cath (Bilateral, 01/17/2014); Cholecystectomy; Tubal ligation; Colonoscopy; Mastectomy w/ sentinel node biopsy (Right, 01/27/2015); Breast reduction surgery (Left, 01/27/2015); Mastectomy w/ sentinel node biopsy (Right, 01/27/2015); Breast reduction  surgery (Left, 01/27/2015); Portacath placement (Left, 03/23/2015); Cataract extraction w/PHACO (Right, 04/11/2016); Cataract extraction w/PHACO (Left, 05/02/2016); and Total shoulder arthroplasty (Left, 04/19/2017).  Family History:  Her family history includes CAD in her brother and brother; Heart attack (age of onset: 54) in her mother; Heart attack (age of onset: 76) in her father.  Social History:  She  reports that she quit smoking about 9 years ago. Her smoking use included cigarettes. She has a 40.00 pack-year smoking history. She has quit using smokeless tobacco. She reports that she does not drink alcohol or use drugs.

## 2019-05-07 NOTE — Addendum Note (Signed)
Addended by: Elton Sin on: 05/07/2019 10:36 AM   Modules accepted: Orders

## 2019-05-26 DIAGNOSIS — M545 Low back pain: Secondary | ICD-10-CM | POA: Diagnosis not present

## 2019-05-26 DIAGNOSIS — M5126 Other intervertebral disc displacement, lumbar region: Secondary | ICD-10-CM | POA: Diagnosis not present

## 2019-05-26 DIAGNOSIS — M48061 Spinal stenosis, lumbar region without neurogenic claudication: Secondary | ICD-10-CM | POA: Diagnosis not present

## 2019-05-26 DIAGNOSIS — M5416 Radiculopathy, lumbar region: Secondary | ICD-10-CM | POA: Diagnosis not present

## 2019-05-27 DIAGNOSIS — F4323 Adjustment disorder with mixed anxiety and depressed mood: Secondary | ICD-10-CM | POA: Diagnosis not present

## 2019-06-06 DIAGNOSIS — E559 Vitamin D deficiency, unspecified: Secondary | ICD-10-CM | POA: Diagnosis not present

## 2019-06-06 DIAGNOSIS — E1121 Type 2 diabetes mellitus with diabetic nephropathy: Secondary | ICD-10-CM | POA: Diagnosis not present

## 2019-06-06 DIAGNOSIS — D649 Anemia, unspecified: Secondary | ICD-10-CM | POA: Diagnosis not present

## 2019-06-06 DIAGNOSIS — Z794 Long term (current) use of insulin: Secondary | ICD-10-CM | POA: Diagnosis not present

## 2019-06-06 DIAGNOSIS — I2510 Atherosclerotic heart disease of native coronary artery without angina pectoris: Secondary | ICD-10-CM | POA: Diagnosis not present

## 2019-06-06 DIAGNOSIS — Z79899 Other long term (current) drug therapy: Secondary | ICD-10-CM | POA: Diagnosis not present

## 2019-06-09 DIAGNOSIS — Z713 Dietary counseling and surveillance: Secondary | ICD-10-CM | POA: Diagnosis not present

## 2019-06-09 DIAGNOSIS — Z9861 Coronary angioplasty status: Secondary | ICD-10-CM | POA: Diagnosis not present

## 2019-06-09 DIAGNOSIS — D539 Nutritional anemia, unspecified: Secondary | ICD-10-CM | POA: Diagnosis not present

## 2019-06-09 DIAGNOSIS — Z9884 Bariatric surgery status: Secondary | ICD-10-CM | POA: Diagnosis not present

## 2019-06-09 DIAGNOSIS — F4329 Adjustment disorder with other symptoms: Secondary | ICD-10-CM | POA: Diagnosis not present

## 2019-06-09 DIAGNOSIS — E669 Obesity, unspecified: Secondary | ICD-10-CM | POA: Diagnosis not present

## 2019-06-09 DIAGNOSIS — I2510 Atherosclerotic heart disease of native coronary artery without angina pectoris: Secondary | ICD-10-CM | POA: Diagnosis not present

## 2019-06-09 DIAGNOSIS — Z6832 Body mass index (BMI) 32.0-32.9, adult: Secondary | ICD-10-CM | POA: Diagnosis not present

## 2019-06-09 DIAGNOSIS — E1169 Type 2 diabetes mellitus with other specified complication: Secondary | ICD-10-CM | POA: Diagnosis not present

## 2019-06-10 DIAGNOSIS — I10 Essential (primary) hypertension: Secondary | ICD-10-CM | POA: Diagnosis not present

## 2019-06-10 DIAGNOSIS — Z0000 Encounter for general adult medical examination without abnormal findings: Secondary | ICD-10-CM | POA: Diagnosis not present

## 2019-06-10 DIAGNOSIS — N182 Chronic kidney disease, stage 2 (mild): Secondary | ICD-10-CM | POA: Diagnosis not present

## 2019-06-10 DIAGNOSIS — C50919 Malignant neoplasm of unspecified site of unspecified female breast: Secondary | ICD-10-CM | POA: Diagnosis not present

## 2019-06-10 DIAGNOSIS — I2510 Atherosclerotic heart disease of native coronary artery without angina pectoris: Secondary | ICD-10-CM | POA: Diagnosis not present

## 2019-06-10 DIAGNOSIS — M1990 Unspecified osteoarthritis, unspecified site: Secondary | ICD-10-CM | POA: Diagnosis not present

## 2019-06-10 DIAGNOSIS — E1121 Type 2 diabetes mellitus with diabetic nephropathy: Secondary | ICD-10-CM | POA: Diagnosis not present

## 2019-06-10 DIAGNOSIS — J449 Chronic obstructive pulmonary disease, unspecified: Secondary | ICD-10-CM | POA: Diagnosis not present

## 2019-06-10 DIAGNOSIS — D649 Anemia, unspecified: Secondary | ICD-10-CM | POA: Diagnosis not present

## 2019-06-10 DIAGNOSIS — E559 Vitamin D deficiency, unspecified: Secondary | ICD-10-CM | POA: Diagnosis not present

## 2019-06-10 DIAGNOSIS — Z79899 Other long term (current) drug therapy: Secondary | ICD-10-CM | POA: Diagnosis not present

## 2019-06-10 DIAGNOSIS — Z981 Arthrodesis status: Secondary | ICD-10-CM | POA: Diagnosis not present

## 2019-06-12 DIAGNOSIS — F4323 Adjustment disorder with mixed anxiety and depressed mood: Secondary | ICD-10-CM | POA: Diagnosis not present

## 2019-07-06 DIAGNOSIS — Z23 Encounter for immunization: Secondary | ICD-10-CM | POA: Diagnosis not present

## 2019-07-08 DIAGNOSIS — F4323 Adjustment disorder with mixed anxiety and depressed mood: Secondary | ICD-10-CM | POA: Diagnosis not present

## 2019-07-08 DIAGNOSIS — G8929 Other chronic pain: Secondary | ICD-10-CM | POA: Diagnosis not present

## 2019-07-08 DIAGNOSIS — M25562 Pain in left knee: Secondary | ICD-10-CM | POA: Diagnosis not present

## 2019-07-08 DIAGNOSIS — M25561 Pain in right knee: Secondary | ICD-10-CM | POA: Diagnosis not present

## 2019-07-22 DIAGNOSIS — H8110 Benign paroxysmal vertigo, unspecified ear: Secondary | ICD-10-CM | POA: Diagnosis not present

## 2019-07-22 DIAGNOSIS — J019 Acute sinusitis, unspecified: Secondary | ICD-10-CM | POA: Diagnosis not present

## 2019-08-11 DIAGNOSIS — F4323 Adjustment disorder with mixed anxiety and depressed mood: Secondary | ICD-10-CM | POA: Diagnosis not present

## 2019-08-14 DIAGNOSIS — M1712 Unilateral primary osteoarthritis, left knee: Secondary | ICD-10-CM | POA: Diagnosis not present

## 2019-08-14 DIAGNOSIS — M25562 Pain in left knee: Secondary | ICD-10-CM | POA: Diagnosis not present

## 2019-08-14 DIAGNOSIS — G8929 Other chronic pain: Secondary | ICD-10-CM | POA: Diagnosis not present

## 2019-08-14 DIAGNOSIS — M25561 Pain in right knee: Secondary | ICD-10-CM | POA: Diagnosis not present

## 2019-08-18 DIAGNOSIS — E1121 Type 2 diabetes mellitus with diabetic nephropathy: Secondary | ICD-10-CM | POA: Diagnosis not present

## 2019-08-18 DIAGNOSIS — Z7189 Other specified counseling: Secondary | ICD-10-CM | POA: Diagnosis not present

## 2019-08-18 DIAGNOSIS — N182 Chronic kidney disease, stage 2 (mild): Secondary | ICD-10-CM | POA: Diagnosis not present

## 2019-08-27 ENCOUNTER — Telehealth: Payer: Self-pay

## 2019-08-27 NOTE — Telephone Encounter (Signed)
Follow Up:     Pt is returning your call. 

## 2019-08-27 NOTE — Telephone Encounter (Signed)
LVM 08/27/2019

## 2019-08-27 NOTE — Telephone Encounter (Signed)
   [facility] Pre-operative Risk Assessment    Request for surgical clearance:  1. What type of surgery is being performed? Left Total Knee Replacement  2. When is this surgery scheduled? TBD  3. What type of clearance is required (medical clearance vs. Pharmacy clearance to hold med vs. Both)? Both  4. Are there any medications that need to be held prior to surgery and how long? none  5. Practice name and name of physician performing surgery? [facility], Lara Mulch, MD  6. What is your office phone number? [phone]   7.   What is your office fax number? [phone]  8.   Anesthesia type (None, local, MAC, general) ? Not listed   Cain Sieve 08/27/2019, 2:34 PM  _________________________________________________________________   (provider comments below)

## 2019-08-28 NOTE — Telephone Encounter (Signed)
   Primary Cardiologist: Quay Burow, MD  Chart reviewed as part of pre-operative protocol coverage. Given past medical history and time since last visit, based on ACC/AHA guidelines, Mariah Kelley would be at acceptable risk for the planned procedure without further cardiovascular testing.   Spoke with patient 08/28/2019 patient was doing well from a cardiac perspective for his no shortness of breath or anginal symptoms. She is performing greater than 4 METS without symptoms. She was recently seen by her PCP in which they performed an EKG and blood work and she was cleared for surgery from their perspective.  I will route this recommendation to the requesting party via Epic fax function and remove from pre-op pool.  Please call with questions.  Kathyrn Drown, NP 08/28/2019, 8:55 AM

## 2019-08-29 DIAGNOSIS — Z20828 Contact with and (suspected) exposure to other viral communicable diseases: Secondary | ICD-10-CM | POA: Diagnosis not present

## 2019-09-03 DIAGNOSIS — F4323 Adjustment disorder with mixed anxiety and depressed mood: Secondary | ICD-10-CM | POA: Diagnosis not present

## 2019-09-08 ENCOUNTER — Other Ambulatory Visit: Payer: Self-pay | Admitting: Orthopedic Surgery

## 2019-09-22 DIAGNOSIS — F4329 Adjustment disorder with other symptoms: Secondary | ICD-10-CM | POA: Diagnosis not present

## 2019-10-08 ENCOUNTER — Encounter (HOSPITAL_COMMUNITY): Payer: Self-pay

## 2019-10-08 NOTE — Patient Instructions (Signed)
DUE TO COVID-19 ONLY ONE VISITOR IS ALLOWED TO COME WITH YOU AND STAY IN THE WAITING ROOM ONLY DURING PRE OP AND PROCEDURE DAY OF SURGERY. THE 1 VISITOR MAY VISIT WITH YOU AFTER SURGERY IN YOUR PRIVATE ROOM DURING VISITING HOURS ONLY!  YOU NEED TO HAVE A COVID 19 TEST ON_______ @_______ , THIS TEST MUST BE DONE BEFORE SURGERY, COME  [place], [address] , [id number].  ([place]) ONCE YOUR COVID TEST IS COMPLETED, PLEASE BEGIN THE QUARANTINE INSTRUCTIONS AS OUTLINED IN YOUR HANDOUT.                Mariah Kelley  10/08/2019   Your procedure is scheduled on: 10-13-19   Report to [facility] Main  Entrance   Report to admitting at         0730 AM     Call this number if you have problems the morning of surgery [phone]    Remember: NO SOLID FOOD AFTER MIDNIGHT THE NIGHT PRIOR TO SURGERY. NOTHING BY MOUTH EXCEPT CLEAR LIQUIDS UNTIL    0700 am  . PLEASE FINISH ENSURE DRINK PER SURGEON ORDER  WHICH NEEDS TO BE COMPLETED AT          0700 am then nothing by mouth.    CLEAR LIQUID DIET   Foods Allowed                                                                                          Foods Excluded  Coffee and tea, regular and decaf no creamer                                  liquids that you cannot  Plain Jell-O any favor except red or purple                                           see through such as: Fruit ices (not with fruit pulp)                                                             milk, soups, orange juice  Iced Popsicles                                                              All solid food Carbonated beverages, regular and diet                                    Cranberry, grape  and apple juices Sports drinks like Gatorade Lightly seasoned clear broth or consume(fat free) Sugar, honey syrup  Sample Menu Breakfast                                Lunch                                     Supper Cranberry juice                     Beef broth                            Chicken broth Jell-O                                     Grape juice                           Apple juice Coffee or tea                        Jell-O                                      Popsicle                                                Coffee or tea                        Coffee or tea  _____________________________________________________________________     BRUSH YOUR TEETH MORNING OF SURGERY AND RINSE YOUR MOUTH OUT, NO CHEWING GUM CANDY OR MINTS.     Take these medicines the morning of surgery with A SIP OF WATER: metoprolol, inhalers and bring,arimidex  DO NOT TAKE ANY DIABETIC MEDICATIONS DAY OF YOUR SURGERY                               You may not have any metal on your body including hair pins and              piercings  Do not wear jewelry, make-up, lotions, powders or perfumes, deodorant             Do not wear nail polish on your fingernails.  Do not shave  48 hours prior to surgery.                 Do not bring valuables to the hospital. [place].  Contacts, dentures or bridgework may not be worn into surgery.                 Please read over the following fact sheets you were given: _____________________________________________________________________             [facility] -  Preparing for Surgery Before surgery, you can play an important role.  Because skin is not sterile, your skin needs to be as free of germs as possible.  You can reduce the number of germs on your skin by washing with CHG (chlorahexidine gluconate) soap before surgery.  CHG is an antiseptic cleaner which kills germs and bonds with the skin to continue killing germs even after washing. Please DO NOT use if you have an allergy to CHG or antibacterial soaps.  If your skin becomes reddened/irritated stop using the CHG and inform your nurse when you arrive at Short Stay. Do not shave (including  legs and underarms) for at least 48 hours prior to the first CHG shower.  You may shave your face/neck. Please follow these instructions carefully:  1.  Shower with CHG Soap the night before surgery and the  morning of Surgery.  2.  If you choose to wash your hair, wash your hair first as usual with your  normal  shampoo.  3.  After you shampoo, rinse your hair and body thoroughly to remove the  shampoo.                           4.  Use CHG as you would any other liquid soap.  You can apply chg directly  to the skin and wash                       Gently with a scrungie or clean washcloth.  5.  Apply the CHG Soap to your body ONLY FROM THE NECK DOWN.   Do not use on face/ open                           Wound or open sores. Avoid contact with eyes, ears mouth and genitals (private parts).                       Wash face,  Genitals (private parts) with your normal soap.             6.  Wash thoroughly, paying special attention to the area where your surgery  will be performed.  7.  Thoroughly rinse your body with warm water from the neck down.  8.  DO NOT shower/wash with your normal soap after using and rinsing off  the CHG Soap.                9.  Pat yourself dry with a clean towel.            10.  Wear clean pajamas.            11.  Place clean sheets on your bed the night of your first shower and do not  sleep with pets. Day of Surgery : Do not apply any lotions/deodorants the morning of surgery.  Please wear clean clothes to the hospital/surgery center.  FAILURE TO FOLLOW THESE INSTRUCTIONS MAY RESULT IN THE CANCELLATION OF YOUR SURGERY PATIENT SIGNATURE_________________________________  NURSE SIGNATURE__________________________________  ________________________________________________________________________   Adam Phenix  An incentive spirometer is a tool that can help keep your lungs clear and active. This tool measures how well you are filling your lungs with each breath.  Taking long deep breaths may help reverse or decrease the chance of developing breathing (pulmonary) problems (especially infection) following:  A long period of time when you  are unable to move or be active. BEFORE THE PROCEDURE   If the spirometer includes an indicator to show your best effort, your nurse or respiratory therapist will set it to a desired goal.  If possible, sit up straight or lean slightly forward. Try not to slouch.  Hold the incentive spirometer in an upright position. INSTRUCTIONS FOR USE  1. Sit on the edge of your bed if possible, or sit up as far as you can in bed or on a chair. 2. Hold the incentive spirometer in an upright position. 3. Breathe out normally. 4. Place the mouthpiece in your mouth and seal your lips tightly around it. 5. Breathe in slowly and as deeply as possible, raising the piston or the ball toward the top of the column. 6. Hold your breath for 3-5 seconds or for as long as possible. Allow the piston or ball to fall to the bottom of the column. 7. Remove the mouthpiece from your mouth and breathe out normally. 8. Rest for a few seconds and repeat Steps 1 through 7 at least 10 times every 1-2 hours when you are awake. Take your time and take a few normal breaths between deep breaths. 9. The spirometer may include an indicator to show your best effort. Use the indicator as a goal to work toward during each repetition. 10. After each set of 10 deep breaths, practice coughing to be sure your lungs are clear. If you have an incision (the cut made at the time of surgery), support your incision when coughing by placing a pillow or rolled up towels firmly against it. Once you are able to get out of bed, walk around indoors and cough well. You may stop using the incentive spirometer when instructed by your caregiver.  RISKS AND COMPLICATIONS  Take your time so you do not get dizzy or light-headed.  If you are in pain, you may need to take or ask for pain  medication before doing incentive spirometry. It is harder to take a deep breath if you are having pain. AFTER USE  Rest and breathe slowly and easily.  It can be helpful to keep track of a log of your progress. Your caregiver can provide you with a simple table to help with this. If you are using the spirometer at home, follow these instructions: Speculator IF:   You are having difficultly using the spirometer.  You have trouble using the spirometer as often as instructed.  Your pain medication is not giving enough relief while using the spirometer.  You develop fever of 100.5 F (38.1 C) or higher. SEEK IMMEDIATE MEDICAL CARE IF:   You cough up bloody sputum that had not been present before.  You develop fever of 102 F (38.9 C) or greater.  You develop worsening pain at or near the incision site. MAKE SURE YOU:   Understand these instructions.  Will watch your condition.  Will get help right away if you are not doing well or get worse. Document Released: 03/05/2007 Document Revised: 01/15/2012 Document Reviewed: 05/06/2007 [facility] Patient Information 2014 [place], [place].   ________________________________________________________________________

## 2019-10-08 NOTE — Progress Notes (Signed)
PCP - Jonathon Jordan Cardiologist - Kathyrn Drown NP clearance note 08-28-19 epic C Chest x-ray -  EKG - 10-28-18 epic Stress Test -  ECHO -  Cardiac Cath -   Sleep Study -  CPAP - Bipap  Fasting Blood Sugar -  Checks Blood Sugar ___2x a week__ times a day  Blood Thinner Instructions: Aspirin Instructions: Last Dose:  Anesthesia review: Cardiac stent 2015, MI,OSA bipap  Patient denies shortness of breath, fever, cough and chest pain at PAT appointment    NONE   Patient verbalized understanding of instructions that were given to them at the PAT appointment. Patient was also instructed that they will need to review over the PAT instructions again at home before surgery.

## 2019-10-09 ENCOUNTER — Other Ambulatory Visit (HOSPITAL_COMMUNITY)
Admission: RE | Admit: 2019-10-09 | Discharge: 2019-10-09 | Disposition: A | Discharge status: Home / Self Care | Payer: Medicare Other | Source: Ambulatory Visit | Attending: Orthopedic Surgery | Admitting: Orthopedic Surgery

## 2019-10-09 DIAGNOSIS — Z01812 Encounter for preprocedural laboratory examination: Secondary | ICD-10-CM | POA: Insufficient documentation

## 2019-10-09 DIAGNOSIS — Z20828 Contact with and (suspected) exposure to other viral communicable diseases: Secondary | ICD-10-CM | POA: Insufficient documentation

## 2019-10-10 ENCOUNTER — Other Ambulatory Visit: Payer: Self-pay

## 2019-10-10 ENCOUNTER — Encounter (HOSPITAL_COMMUNITY): Payer: Self-pay

## 2019-10-10 ENCOUNTER — Encounter (HOSPITAL_COMMUNITY)
Admission: RE | Admit: 2019-10-10 | Discharge: 2019-10-10 | Disposition: A | Discharge status: Home / Self Care | Payer: Medicare Other | Source: Ambulatory Visit | Attending: Orthopedic Surgery | Admitting: Orthopedic Surgery

## 2019-10-10 DIAGNOSIS — Z79899 Other long term (current) drug therapy: Secondary | ICD-10-CM | POA: Insufficient documentation

## 2019-10-10 DIAGNOSIS — Z9049 Acquired absence of other specified parts of digestive tract: Secondary | ICD-10-CM | POA: Insufficient documentation

## 2019-10-10 DIAGNOSIS — I2510 Atherosclerotic heart disease of native coronary artery without angina pectoris: Secondary | ICD-10-CM | POA: Diagnosis not present

## 2019-10-10 DIAGNOSIS — Z87891 Personal history of nicotine dependence: Secondary | ICD-10-CM | POA: Diagnosis not present

## 2019-10-10 DIAGNOSIS — G4733 Obstructive sleep apnea (adult) (pediatric): Secondary | ICD-10-CM | POA: Diagnosis not present

## 2019-10-10 DIAGNOSIS — Z7902 Long term (current) use of antithrombotics/antiplatelets: Secondary | ICD-10-CM | POA: Diagnosis not present

## 2019-10-10 DIAGNOSIS — Z6829 Body mass index (BMI) 29.0-29.9, adult: Secondary | ICD-10-CM | POA: Diagnosis not present

## 2019-10-10 DIAGNOSIS — Z96612 Presence of left artificial shoulder joint: Secondary | ICD-10-CM | POA: Insufficient documentation

## 2019-10-10 DIAGNOSIS — Z853 Personal history of malignant neoplasm of breast: Secondary | ICD-10-CM | POA: Insufficient documentation

## 2019-10-10 DIAGNOSIS — E785 Hyperlipidemia, unspecified: Secondary | ICD-10-CM | POA: Diagnosis not present

## 2019-10-10 DIAGNOSIS — Z7982 Long term (current) use of aspirin: Secondary | ICD-10-CM | POA: Insufficient documentation

## 2019-10-10 DIAGNOSIS — Z90710 Acquired absence of both cervix and uterus: Secondary | ICD-10-CM | POA: Insufficient documentation

## 2019-10-10 DIAGNOSIS — Z79811 Long term (current) use of aromatase inhibitors: Secondary | ICD-10-CM | POA: Insufficient documentation

## 2019-10-10 DIAGNOSIS — Z01812 Encounter for preprocedural laboratory examination: Secondary | ICD-10-CM | POA: Diagnosis not present

## 2019-10-10 DIAGNOSIS — E119 Type 2 diabetes mellitus without complications: Secondary | ICD-10-CM | POA: Diagnosis not present

## 2019-10-10 DIAGNOSIS — J449 Chronic obstructive pulmonary disease, unspecified: Secondary | ICD-10-CM | POA: Diagnosis not present

## 2019-10-10 DIAGNOSIS — E669 Obesity, unspecified: Secondary | ICD-10-CM | POA: Insufficient documentation

## 2019-10-10 DIAGNOSIS — Z9851 Tubal ligation status: Secondary | ICD-10-CM | POA: Insufficient documentation

## 2019-10-10 DIAGNOSIS — Z9011 Acquired absence of right breast and nipple: Secondary | ICD-10-CM | POA: Diagnosis not present

## 2019-10-10 DIAGNOSIS — M1712 Unilateral primary osteoarthritis, left knee: Secondary | ICD-10-CM | POA: Diagnosis not present

## 2019-10-10 DIAGNOSIS — I10 Essential (primary) hypertension: Secondary | ICD-10-CM | POA: Diagnosis not present

## 2019-10-10 HISTORY — DX: Unspecified asthma, uncomplicated: J45.909

## 2019-10-10 HISTORY — DX: Other complications of anesthesia, initial encounter: T88.59XA

## 2019-10-10 HISTORY — DX: Other specified postprocedural states: R11.2

## 2019-10-10 HISTORY — DX: Chronic obstructive pulmonary disease, unspecified: J44.9

## 2019-10-10 HISTORY — DX: Other specified postprocedural states: Z98.890

## 2019-10-10 LAB — COMPREHENSIVE METABOLIC PANEL
ALT: 27 U/L (ref 0–44)
AST: 23 U/L (ref 15–41)
Albumin: 4.3 g/dL (ref 3.5–5.0)
Alkaline Phosphatase: 80 U/L (ref 38–126)
Anion gap: 8 (ref 5–15)
BUN: 22 mg/dL (ref 8–23)
CO2: 27 mmol/L (ref 22–32)
Calcium: 9.9 mg/dL (ref 8.9–10.3)
Chloride: 108 mmol/L (ref 98–111)
Creatinine, Ser: 0.75 mg/dL (ref 0.44–1.00)
GFR calc Af Amer: >60 mL/min (ref >60)
GFR calc non Af Amer: >60 mL/min (ref >60)
Glucose, Bld: 96 mg/dL (ref 70–99)
Potassium: 4.5 mmol/L (ref 3.5–5.1)
Sodium: 143 mmol/L (ref 135–145)
Total Bilirubin: 0.7 mg/dL (ref 0.3–1.2)
Total Protein: 6.9 g/dL (ref 6.5–8.1)

## 2019-10-10 LAB — CBC WITH DIFFERENTIAL/PLATELET
Abs Immature Granulocytes: 0.02 10*3/uL (ref 0.00–0.07)
Basophils Absolute: 0 10*3/uL (ref 0.0–0.1)
Basophils Relative: 1 %
Eosinophils Absolute: 0.3 10*3/uL (ref 0.0–0.5)
Eosinophils Relative: 4 %
HCT: 38.9 % (ref 36.0–46.0)
Hemoglobin: 12.2 g/dL (ref 12.0–15.0)
Immature Granulocytes: 0 %
Lymphocytes Relative: 31 %
Lymphs Abs: 1.9 10*3/uL (ref 0.7–4.0)
MCH: 28.9 pg (ref 26.0–34.0)
MCHC: 31.4 g/dL (ref 30.0–36.0)
MCV: 92.2 fL (ref 80.0–100.0)
Monocytes Absolute: 0.6 10*3/uL (ref 0.1–1.0)
Monocytes Relative: 9 %
Neutro Abs: 3.4 10*3/uL (ref 1.7–7.7)
Neutrophils Relative %: 55 %
Platelets: 260 10*3/uL (ref 150–400)
RBC: 4.22 MIL/uL (ref 3.87–5.11)
RDW: 13.2 % (ref 11.5–15.5)
WBC: 6.2 10*3/uL (ref 4.0–10.5)
nRBC: 0 % (ref 0.0–0.2)

## 2019-10-10 LAB — SURGICAL PCR SCREEN
MRSA, PCR: NEGATIVE
Staphylococcus aureus: NEGATIVE

## 2019-10-10 LAB — GLUCOSE, CAPILLARY: Glucose-Capillary: 96 mg/dL (ref 70–99)

## 2019-10-10 LAB — HEMOGLOBIN A1C
Hgb A1c MFr Bld: 6 % — ABNORMAL HIGH (ref 4.8–5.6)
Mean Plasma Glucose: 125.5 mg/dL

## 2019-10-10 NOTE — Progress Notes (Signed)
Anesthesia Chart Review   Case: [id number] Date/Time: 10/13/19 0940   Procedure: TOTAL KNEE ARTHROPLASTY (Left Knee) - 75 mins needed for length of case.   Anesthesia type: Spinal   Pre-op diagnosis: Primary Osteoarthritis Left Knee   Location: [place] / [facility]   Surgeon: Vickey Huger, MD      DISCUSSION:68 y.o. former smoker (40 pack years, quit 07/30/09) with h/o PONV, HLD, HTN, CAD, COPD, DM II, asthma, OSA w/bipap, left knee OA scheduled for above procedure 10/13/2019 with Dr. Vickey Huger.   Cleared by cardiology 08/28/2019.  Per Kathyrn Drown, NP, "Chart reviewed as part of pre-operative protocol coverage. Given past medical history and time since last visit, based on ACC/AHA guidelines, Mariah Kelley would be at acceptable risk for the planned procedure without further cardiovascular testing.   Spoke with patient 08/28/2019 patient was doing well from a cardiac perspective for his no shortness of breath or anginal symptoms. She is performing greater than 4 METS without symptoms. She was recently seen by her PCP in which they performed an EKG and blood work and she was cleared for surgery from their perspective."  Anticipate pt can proceed with planned procedure barring acute status change.   VS: BP (!) 177/66   Pulse 68   Temp 36.6 C (Oral)   Resp 16   Ht 5' 8.5" (1.74 m)   Wt 89.4 kg   SpO2 100%   BMI 29.52 kg/m   PROVIDERS: Jonathon Jordan, MD is PCP   Quay Burow, MD is Cardiologist  LABS: Labs reviewed: Acceptable for surgery. (all labs ordered are listed, but only abnormal results are displayed)  Labs Reviewed  SURGICAL PCR SCREEN  GLUCOSE, CAPILLARY  CBC WITH DIFFERENTIAL/PLATELET  COMPREHENSIVE METABOLIC PANEL  HEMOGLOBIN A1C     IMAGES:   EKG: 10/28/2018 Rate 78 bpm Normal sinus rhythm   CV: Stress Echo 07/04/2018 SUMMARY The patient had no chest pain. The patient achieved 114 % of maximum predicted heart rate. The METS achieved was  6.7. Exercise capacity was average. Negative stress ECG for inducible ischemia at target heart rate. Negative exercise echocardiography for inducible ischemia at target heart  rate. LV contractility is not vigrous at peak stress but no focal wall motion  apparent -  REST ECHO Normal left ventricular function at rest. There were no segmental wall motion  abnormalities at rest. The estimated LV ejection fraction is 55-60% . - STRESS ECHO Normal left ventricular function and global wall motion with stress. There  were no segmental wall motion abnormalities post exercise. There was normal  increase in global LV function post exercise. The estimated LV ejection  fraction is 65-70% with stress. Negative exercise echocardiography for  inducible ischemia at target heart rate. Past Medical History:  Diagnosis Date  . Arthritis   . Asthma   . Breast cancer of upper-outer quadrant of right female breast ([place]) 12/23/2014  . Bruises easily   . CAD S/P percutaneous coronary angioplasty 01/17/2014   takes Brilinta and Plavix daily  . Cataracts, bilateral   . Complication of anesthesia   . COPD (chronic obstructive pulmonary disease) ([place])   . Diabetes mellitus without complication ([place])    pre DM per pt. now after weight loss surgery   no meds  . History of bronchitis 7-54yrs ago  . History of tobacco abuse    40 pack-years  . Hyperlipidemia    takes Lipitor daily  . Hypertension    takes Metoprolol daily  . Joint  pain   . Obesity   . OSA treated with BiPAP    moderate with AHI 19/hr now on BIPAP auto  . PONV (postoperative nausea and vomiting)    [place]  . ST elevation myocardial infarction (STEMI) of inferolateral wall ([place]) 01/17/2014    Past Surgical History:  Procedure Laterality Date  . ABDOMINAL HYSTERECTOMY    . APPENDECTOMY    . BREAST REDUCTION SURGERY Left 01/27/2015  . BREAST REDUCTION SURGERY Left 01/27/2015   Procedure: MAMMARY REDUCTION  LEFT (BREAST);  Surgeon: Irene Limbo, MD;  Location: [place];  Service: Plastics;  Laterality: Left;  . CATARACT EXTRACTION W/PHACO Right 04/11/2016   Procedure: CATARACT EXTRACTION PHACO AND INTRAOCULAR LENS PLACEMENT (IOC);  Surgeon: Rutherford Guys, MD;  Location: [facility];  Service: Ophthalmology;  Laterality: Right;  CDE: 14.15  . CATARACT EXTRACTION W/PHACO Left 05/02/2016   Procedure: CATARACT EXTRACTION PHACO AND INTRAOCULAR LENS PLACEMENT (IOC);  Surgeon: Rutherford Guys, MD;  Location: [facility];  Service: Ophthalmology;  Laterality: Left;  CDE: 12.11  . CHOLECYSTECTOMY    . COLONOSCOPY    . EYE SURGERY    . LEFT HEART CATH Bilateral 01/17/2014   Procedure: LEFT HEART CATH;  Surgeon: Lorretta Harp, MD;  Location: [facility] CATH LAB;  Service: Cardiovascular;  Laterality: Bilateral;  . MASTECTOMY W/ SENTINEL NODE BIOPSY Right 01/27/2015   dr Lucia Gaskins   . MASTECTOMY W/ SENTINEL NODE BIOPSY Right 01/27/2015   Procedure: RIGHT MASTECTOMY WITH RIGHT AXILLARY  SENTINEL LYMPH NODE BIOPSY;  Surgeon: Alphonsa Overall, MD;  Location: [place];  Service: General;  Laterality: Right;  . PERCUTANEOUS CORONARY STENT INTERVENTION (PCI-S)    . PORTACATH PLACEMENT Left 03/23/2015   Procedure: INSERTION PORT-A-CATH ;  Surgeon: Alphonsa Overall, MD;  Location: [facility];  Service: General;  Laterality: Left;  . sleeve gastrectomy    . SPINAL FUSION    . TOTAL SHOULDER ARTHROPLASTY Left 04/19/2017   Procedure: TOTAL SHOULDER ARTHROPLASTY;  Surgeon: Tania Ade, MD;  Location: [place];  Service: Orthopedics;  Laterality: Left;  Left total shoulder arthroplasty  . TUBAL LIGATION      MEDICATIONS: . albuterol (PROVENTIL HFA;VENTOLIN HFA) 108 (90 Base) MCG/ACT inhaler  . anastrozole (ARIMIDEX) 1 MG tablet  . aspirin EC 81 MG tablet  . atorvastatin (LIPITOR) 80 MG tablet  . calcium citrate-vitamin D 500-500 MG-UNIT chewable tablet  . Ferrous Sulfate (IRON) 142 (45 Fe) MG TBCR  . Fluticasone Furoate (ARNUITY ELLIPTA) 100 MCG/ACT AEPB  . metoprolol tartrate  (LOPRESSOR) 50 MG tablet  . Multiple Vitamin (MULTIVITAMIN WITH MINERALS) TABS tablet  . nitroGLYCERIN (NITROSTAT) 0.4 MG SL tablet  . Spacer/Aero-Holding Chambers (AEROCHAMBER MV) inhaler   No current facility-administered medications for this encounter.     Maia Plan [facility] Pre-Surgical Testing [phone] 10/10/19  2:23 PM

## 2019-10-12 ENCOUNTER — Encounter (HOSPITAL_COMMUNITY): Payer: Self-pay | Admitting: Anesthesiology

## 2019-10-12 LAB — NOVEL CORONAVIRUS, NAA (HOSP ORDER, SEND-OUT TO REF LAB; TAT 18-24 HRS): SARS-CoV-2, NAA: NOT DETECTED

## 2019-10-12 MED ORDER — BUPIVACAINE LIPOSOME 1.3 % IJ SUSP
20.0000 mL | Freq: Once | INTRAMUSCULAR | Status: DC
  Filled 2019-10-12: qty 20

## 2019-10-13 ENCOUNTER — Ambulatory Visit (HOSPITAL_COMMUNITY): Payer: Medicare Other | Admitting: Physician Assistant

## 2019-10-13 ENCOUNTER — Ambulatory Visit (HOSPITAL_COMMUNITY): Payer: Medicare Other | Admitting: Anesthesiology

## 2019-10-13 ENCOUNTER — Encounter (HOSPITAL_COMMUNITY): Payer: Self-pay

## 2019-10-13 ENCOUNTER — Ambulatory Visit (HOSPITAL_COMMUNITY)
Admission: RE | Admit: 2019-10-13 | Discharge: 2019-10-13 | Disposition: A | Discharge status: Home / Self Care | Payer: Medicare Other | Attending: Orthopedic Surgery | Admitting: Orthopedic Surgery

## 2019-10-13 ENCOUNTER — Encounter (HOSPITAL_COMMUNITY)
Admission: RE | Disposition: A | Discharge status: Home / Self Care | Payer: Self-pay | Source: Home / Self Care | Attending: Orthopedic Surgery

## 2019-10-13 DIAGNOSIS — Z6829 Body mass index (BMI) 29.0-29.9, adult: Secondary | ICD-10-CM | POA: Diagnosis not present

## 2019-10-13 DIAGNOSIS — E785 Hyperlipidemia, unspecified: Secondary | ICD-10-CM | POA: Diagnosis not present

## 2019-10-13 DIAGNOSIS — E1136 Type 2 diabetes mellitus with diabetic cataract: Secondary | ICD-10-CM | POA: Insufficient documentation

## 2019-10-13 DIAGNOSIS — I10 Essential (primary) hypertension: Secondary | ICD-10-CM | POA: Diagnosis not present

## 2019-10-13 DIAGNOSIS — Z955 Presence of coronary angioplasty implant and graft: Secondary | ICD-10-CM | POA: Diagnosis not present

## 2019-10-13 DIAGNOSIS — I110 Hypertensive heart disease with heart failure: Secondary | ICD-10-CM | POA: Insufficient documentation

## 2019-10-13 DIAGNOSIS — Z87891 Personal history of nicotine dependence: Secondary | ICD-10-CM | POA: Insufficient documentation

## 2019-10-13 DIAGNOSIS — Z79899 Other long term (current) drug therapy: Secondary | ICD-10-CM | POA: Insufficient documentation

## 2019-10-13 DIAGNOSIS — Z853 Personal history of malignant neoplasm of breast: Secondary | ICD-10-CM | POA: Insufficient documentation

## 2019-10-13 DIAGNOSIS — I2510 Atherosclerotic heart disease of native coronary artery without angina pectoris: Secondary | ICD-10-CM | POA: Insufficient documentation

## 2019-10-13 DIAGNOSIS — J449 Chronic obstructive pulmonary disease, unspecified: Secondary | ICD-10-CM | POA: Diagnosis not present

## 2019-10-13 DIAGNOSIS — Z7982 Long term (current) use of aspirin: Secondary | ICD-10-CM | POA: Insufficient documentation

## 2019-10-13 DIAGNOSIS — Z96612 Presence of left artificial shoulder joint: Secondary | ICD-10-CM | POA: Diagnosis not present

## 2019-10-13 DIAGNOSIS — G4733 Obstructive sleep apnea (adult) (pediatric): Secondary | ICD-10-CM | POA: Diagnosis not present

## 2019-10-13 DIAGNOSIS — M1712 Unilateral primary osteoarthritis, left knee: Secondary | ICD-10-CM | POA: Insufficient documentation

## 2019-10-13 DIAGNOSIS — G8918 Other acute postprocedural pain: Secondary | ICD-10-CM | POA: Diagnosis not present

## 2019-10-13 HISTORY — PX: TOTAL KNEE ARTHROPLASTY: SHX125

## 2019-10-13 LAB — GLUCOSE, CAPILLARY
Glucose-Capillary: 93 mg/dL (ref 70–99)
Glucose-Capillary: 106 mg/dL — ABNORMAL HIGH (ref 70–99)

## 2019-10-13 SURGERY — ARTHROPLASTY, KNEE, TOTAL
Anesthesia: Monitor Anesthesia Care | Site: Knee | Laterality: Left

## 2019-10-13 MED ORDER — LACTATED RINGERS IV SOLN
INTRAVENOUS | Status: DC
  Administered 2019-10-13 (×2): via INTRAVENOUS

## 2019-10-13 MED ORDER — SODIUM CHLORIDE 0.9% FLUSH
INTRAVENOUS | Status: DC | PRN
  Administered 2019-10-13: 20 mL

## 2019-10-13 MED ORDER — ONDANSETRON HCL 4 MG/2ML IJ SOLN
INTRAMUSCULAR | Status: DC | PRN
  Administered 2019-10-13: 4 mg via INTRAVENOUS

## 2019-10-13 MED ORDER — FENTANYL CITRATE (PF) 100 MCG/2ML IJ SOLN: 25.0000 ug | INTRAMUSCULAR | Status: DC | PRN

## 2019-10-13 MED ORDER — PHENYLEPHRINE HCL-NACL 10-0.9 MG/250ML-% IV SOLN
INTRAVENOUS | Status: DC | PRN
  Administered 2019-10-13: 10 ug/min via INTRAVENOUS

## 2019-10-13 MED ORDER — ASPIRIN 325 MG PO TBEC: 325.0000 mg | DELAYED_RELEASE_TABLET | Freq: Two times a day (BID) | ORAL | 0 refills | Status: DC

## 2019-10-13 MED ORDER — POVIDONE-IODINE 10 % EX SWAB
2.0000 "application " | Freq: Once | CUTANEOUS | Status: AC
  Administered 2019-10-13: 2 via TOPICAL

## 2019-10-13 MED ORDER — ACETAMINOPHEN 325 MG PO TABS: 325.0000 mg | ORAL_TABLET | ORAL | Status: DC | PRN

## 2019-10-13 MED ORDER — SODIUM CHLORIDE 0.9 % IV BOLUS: 500.0000 mL | Freq: Once | INTRAVENOUS | Status: DC

## 2019-10-13 MED ORDER — ROPIVACAINE HCL 7.5 MG/ML IJ SOLN
INTRAMUSCULAR | Status: DC | PRN
  Administered 2019-10-13: 28 mL via PERINEURAL

## 2019-10-13 MED ORDER — ACETAMINOPHEN 500 MG PO TABS
1000.0000 mg | ORAL_TABLET | Freq: Once | ORAL | Status: AC
  Administered 2019-10-13: 1000 mg via ORAL
  Filled 2019-10-13: qty 2

## 2019-10-13 MED ORDER — SODIUM CHLORIDE (PF) 0.9 % IJ SOLN
INTRAMUSCULAR | Status: AC
  Filled 2019-10-13: qty 20

## 2019-10-13 MED ORDER — ETOMIDATE 2 MG/ML IV SOLN
INTRAVENOUS | Status: AC
  Filled 2019-10-13: qty 10

## 2019-10-13 MED ORDER — PROPOFOL 500 MG/50ML IV EMUL
INTRAVENOUS | Status: DC | PRN
  Administered 2019-10-13: 65 ug/kg/min via INTRAVENOUS

## 2019-10-13 MED ORDER — SODIUM CHLORIDE 0.9 % IR SOLN
Status: DC | PRN
  Administered 2019-10-13 (×3): 1000 mL

## 2019-10-13 MED ORDER — MEPIVACAINE HCL (PF) 2 % IJ SOLN
INTRAMUSCULAR | Status: DC | PRN
  Administered 2019-10-13: 3.75 mL via EPIDURAL

## 2019-10-13 MED ORDER — ACETAMINOPHEN 160 MG/5ML PO SOLN: 325.0000 mg | ORAL | Status: DC | PRN

## 2019-10-13 MED ORDER — BUPIVACAINE LIPOSOME 1.3 % IJ SUSP
INTRAMUSCULAR | Status: DC | PRN
  Administered 2019-10-13: 20 mL

## 2019-10-13 MED ORDER — PROPOFOL 10 MG/ML IV BOLUS
INTRAVENOUS | Status: DC | PRN
  Administered 2019-10-13: 10 mg via INTRAVENOUS

## 2019-10-13 MED ORDER — TRANEXAMIC ACID-NACL 1000-0.7 MG/100ML-% IV SOLN
1000.0000 mg | INTRAVENOUS | Status: AC
  Administered 2019-10-13: 1000 mg via INTRAVENOUS
  Filled 2019-10-13: qty 100

## 2019-10-13 MED ORDER — MEPERIDINE HCL 50 MG/ML IJ SOLN: 6.2500 mg | INTRAMUSCULAR | Status: DC | PRN

## 2019-10-13 MED ORDER — BUPIVACAINE-EPINEPHRINE 0.25% -1:200000 IJ SOLN
INTRAMUSCULAR | Status: DC | PRN
  Administered 2019-10-13: 30 mL

## 2019-10-13 MED ORDER — GLYCOPYRROLATE PF 0.2 MG/ML IJ SOSY
PREFILLED_SYRINGE | INTRAMUSCULAR | Status: DC | PRN
  Administered 2019-10-13: .1 mg via INTRAVENOUS

## 2019-10-13 MED ORDER — OXYCODONE HCL 5 MG PO TABS: 5.0000 mg | ORAL_TABLET | Freq: Four times a day (QID) | ORAL | 0 refills | Status: DC | PRN

## 2019-10-13 MED ORDER — CHLORHEXIDINE GLUCONATE 4 % EX LIQD: 60.0000 mL | Freq: Once | CUTANEOUS | Status: DC

## 2019-10-13 MED ORDER — CEFAZOLIN SODIUM-DEXTROSE 2-4 GM/100ML-% IV SOLN
2.0000 g | INTRAVENOUS | Status: AC
  Administered 2019-10-13: 11:00:00 2 g via INTRAVENOUS
  Filled 2019-10-13: qty 100

## 2019-10-13 MED ORDER — ONDANSETRON HCL 4 MG/2ML IJ SOLN: 4.0000 mg | Freq: Once | INTRAMUSCULAR | Status: DC | PRN

## 2019-10-13 MED ORDER — METHOCARBAMOL 500 MG PO TABS: 500.0000 mg | ORAL_TABLET | Freq: Four times a day (QID) | ORAL | 0 refills | Status: DC

## 2019-10-13 MED ORDER — GABAPENTIN 300 MG PO CAPS
300.0000 mg | ORAL_CAPSULE | Freq: Once | ORAL | Status: AC
  Administered 2019-10-13: 300 mg via ORAL
  Filled 2019-10-13: qty 1

## 2019-10-13 MED ORDER — CELECOXIB 200 MG PO CAPS
400.0000 mg | ORAL_CAPSULE | Freq: Once | ORAL | Status: AC
  Administered 2019-10-13: 400 mg via ORAL
  Filled 2019-10-13: qty 2

## 2019-10-13 MED ORDER — MIDAZOLAM HCL 2 MG/2ML IJ SOLN
1.0000 mg | Freq: Once | INTRAMUSCULAR | Status: AC
  Administered 2019-10-13: 2 mg via INTRAVENOUS
  Filled 2019-10-13: qty 2

## 2019-10-13 MED ORDER — WATER FOR IRRIGATION, STERILE IR SOLN
Status: DC | PRN
  Administered 2019-10-13: 2000 mL

## 2019-10-13 MED ORDER — BUPIVACAINE-EPINEPHRINE 0.25% -1:200000 IJ SOLN
INTRAMUSCULAR | Status: AC
  Filled 2019-10-13: qty 1

## 2019-10-13 MED ORDER — FENTANYL CITRATE (PF) 100 MCG/2ML IJ SOLN
50.0000 ug | Freq: Once | INTRAMUSCULAR | Status: AC
  Administered 2019-10-13: 100 ug via INTRAVENOUS
  Filled 2019-10-13: qty 2

## 2019-10-13 MED ORDER — DEXAMETHASONE SODIUM PHOSPHATE 10 MG/ML IJ SOLN
INTRAMUSCULAR | Status: DC | PRN
  Administered 2019-10-13: 4 mg via INTRAVENOUS

## 2019-10-13 MED ORDER — DEXAMETHASONE SODIUM PHOSPHATE 4 MG/ML IJ SOLN
INTRAMUSCULAR | Status: DC | PRN
  Administered 2019-10-13: 10 mg via PERINEURAL

## 2019-10-13 MED ORDER — OXYCODONE HCL 5 MG/5ML PO SOLN: 5.0000 mg | Freq: Once | ORAL | Status: DC | PRN

## 2019-10-13 MED ORDER — OXYCODONE HCL 5 MG PO TABS: 5.0000 mg | ORAL_TABLET | Freq: Once | ORAL | Status: DC | PRN

## 2019-10-13 MED ORDER — MIDAZOLAM HCL 2 MG/2ML IJ SOLN
INTRAMUSCULAR | Status: AC
  Filled 2019-10-13: qty 2

## 2019-10-13 MED ORDER — DEXAMETHASONE SODIUM PHOSPHATE 10 MG/ML IJ SOLN: 8.0000 mg | Freq: Once | INTRAMUSCULAR | Status: DC

## 2019-10-13 SURGICAL SUPPLY — 66 items
ARTISURF 10M PLY L 6-9EF KNEE (Knees) ×3 IMPLANT
BAG SPEC THK2 15X12 ZIP CLS (MISCELLANEOUS) ×1
BAG ZIPLOCK 12X15 (MISCELLANEOUS) ×3 IMPLANT
BLADE SAGITTAL 13X1.27X60 (BLADE) ×2 IMPLANT
BLADE SAGITTAL 13X1.27X60MM (BLADE) ×1
BLADE SAW SGTL 83.5X18.5 (BLADE) ×3 IMPLANT
BLADE SURG 15 STRL LF DISP TIS (BLADE) ×1 IMPLANT
BLADE SURG 15 STRL SS (BLADE) ×3
BLADE SURG SZ10 CARB STEEL (BLADE) ×6 IMPLANT
BNDG ELASTIC 6X5.8 VLCR STR LF (GAUZE/BANDAGES/DRESSINGS) ×3 IMPLANT
BOWL SMART MIX CTS (DISPOSABLE) ×3 IMPLANT
BSPLAT TIB 5D E CMNT STM LT (Knees) ×1 IMPLANT
CEMENT BONE SIMPLEX SPEEDSET (Cement) ×6 IMPLANT
CLOSURE WOUND 1/2 X4 (GAUZE/BANDAGES/DRESSINGS) ×2
COVER SURGICAL LIGHT HANDLE (MISCELLANEOUS) ×3 IMPLANT
COVER WAND RF STERILE (DRAPES) IMPLANT
CUFF TOURN SGL QUICK 34 (TOURNIQUET CUFF) ×3
CUFF TRNQT CYL 34X4.125X (TOURNIQUET CUFF) ×1 IMPLANT
DECANTER SPIKE VIAL GLASS SM (MISCELLANEOUS) ×8 IMPLANT
DRAPE INCISE IOBAN 66X45 STRL (DRAPES) ×6 IMPLANT
DRAPE U-SHAPE 47X51 STRL (DRAPES) ×3 IMPLANT
DRESSING AQUACEL AG SP 3.5X10 (GAUZE/BANDAGES/DRESSINGS) IMPLANT
DRSG AQUACEL AG ADV 3.5X10 (GAUZE/BANDAGES/DRESSINGS) ×3 IMPLANT
DRSG AQUACEL AG SP 3.5X10 (GAUZE/BANDAGES/DRESSINGS) ×3
DURAPREP 26ML APPLICATOR (WOUND CARE) ×6 IMPLANT
ELECT REM PT RETURN 15FT ADLT (MISCELLANEOUS) ×3 IMPLANT
FEMUR  CMT CCR STD SZ8 L KNEE (Knees) ×2 IMPLANT
FEMUR CMT CCR STD SZ8 L KNEE (Knees) ×1 IMPLANT
FEMUR CMTD CCR STD SZ8 L KNEE (Knees) IMPLANT
GLOVE BIOGEL M STRL SZ7.5 (GLOVE) ×3 IMPLANT
GLOVE BIOGEL PI IND STRL 7.5 (GLOVE) ×1 IMPLANT
GLOVE BIOGEL PI IND STRL 8.5 (GLOVE) ×2 IMPLANT
GLOVE BIOGEL PI INDICATOR 7.5 (GLOVE) ×2
GLOVE BIOGEL PI INDICATOR 8.5 (GLOVE) ×4
GLOVE SURG ORTHO 8.0 STRL STRW (GLOVE) ×9 IMPLANT
GOWN STRL REUS W/ TWL XL LVL3 (GOWN DISPOSABLE) ×2 IMPLANT
GOWN STRL REUS W/TWL XL LVL3 (GOWN DISPOSABLE) ×6
HANDPIECE INTERPULSE COAX TIP (DISPOSABLE) ×3
HDLS TROCR DRIL PIN KNEE 75 (PIN) ×2
HOLDER FOLEY CATH W/STRAP (MISCELLANEOUS) IMPLANT
HOOD PEEL AWAY FLYTE STAYCOOL (MISCELLANEOUS) ×9 IMPLANT
KIT TURNOVER KIT A (KITS) IMPLANT
MANIFOLD NEPTUNE II (INSTRUMENTS) ×3 IMPLANT
NEEDLE HYPO 22GX1.5 SAFETY (NEEDLE) ×3 IMPLANT
NS IRRIG 1000ML POUR BTL (IV SOLUTION) ×3 IMPLANT
PACK TOTAL KNEE CUSTOM (KITS) ×3 IMPLANT
PENCIL SMOKE EVACUATOR (MISCELLANEOUS) ×2 IMPLANT
PIN DRILL HDLS TROCAR 75 4PK (PIN) IMPLANT
PROTECTOR NERVE ULNAR (MISCELLANEOUS) ×3 IMPLANT
SET HNDPC FAN SPRY TIP SCT (DISPOSABLE) ×1 IMPLANT
STEM POLY PAT PLY 35M KNEE (Knees) ×2 IMPLANT
STEM TIBIA 5 DEG SZ E L KNEE (Knees) IMPLANT
STRIP CLOSURE SKIN 1/2X4 (GAUZE/BANDAGES/DRESSINGS) ×4 IMPLANT
SUT BONE WAX W31G (SUTURE) ×3 IMPLANT
SUT MNCRL AB 3-0 PS2 18 (SUTURE) ×3 IMPLANT
SUT STRATAFIX 0 PDS 27 VIOLET (SUTURE) ×3
SUT STRATAFIX PDS+ 0 24IN (SUTURE) ×3 IMPLANT
SUT VIC AB 1 CT1 36 (SUTURE) ×3 IMPLANT
SUTURE STRATFX 0 PDS 27 VIOLET (SUTURE) ×1 IMPLANT
SYR CONTROL 10ML LL (SYRINGE) ×6 IMPLANT
TIBIA STEM 5 DEG SZ E L KNEE (Knees) ×3 IMPLANT
TRAY CATH 16FR W/PLASTIC CATH (SET/KITS/TRAYS/PACK) ×2 IMPLANT
TRAY FOLEY MTR SLVR 16FR STAT (SET/KITS/TRAYS/PACK) ×1 IMPLANT
WATER STERILE IRR 1000ML POUR (IV SOLUTION) ×6 IMPLANT
WRAP KNEE MAXI GEL POST OP (GAUZE/BANDAGES/DRESSINGS) ×3 IMPLANT
YANKAUER SUCT BULB TIP 10FT TU (MISCELLANEOUS) ×3 IMPLANT

## 2019-10-13 NOTE — Anesthesia Postprocedure Evaluation (Signed)
Anesthesia Post Note  Patient: Mariah Kelley  Procedure(s) Performed: TOTAL KNEE ARTHROPLASTY (Left Knee)     Patient location during evaluation: PACU Anesthesia Type: MAC and Spinal Level of consciousness: awake and alert Pain management: pain level controlled Vital Signs Assessment: post-procedure vital signs reviewed and stable Respiratory status: spontaneous breathing, nonlabored ventilation, respiratory function stable and patient connected to nasal cannula oxygen Cardiovascular status: stable and blood pressure returned to baseline Postop Assessment: no apparent nausea or vomiting Anesthetic complications: no    Last Vitals:  Vitals:   10/13/19 1315 10/13/19 1330  BP: 134/71 138/64  Pulse: (!) 52 (!) 52  Resp: 15 11  Temp:    SpO2: 93% 92%    Last Pain:  Vitals:   10/13/19 1330  TempSrc:   PainSc: 0-No pain    LLE Motor Response: Purposeful movement;Responds to commands (10/13/19 1330) LLE Sensation: Numbness (10/13/19 1330) RLE Motor Response: Purposeful movement;Responds to commands (10/13/19 1330) RLE Sensation: Numbness (10/13/19 1330) L Sensory Level: L3-Anterior knee, lower leg (10/13/19 1330) R Sensory Level: L4-Anterior knee, lower leg (10/13/19 1330)  Genie Mirabal

## 2019-10-13 NOTE — Transfer of Care (Signed)
Immediate Anesthesia Transfer of Care Note  Patient: Mariah Kelley  Procedure(s) Performed: TOTAL KNEE ARTHROPLASTY (Left Knee)  Patient Location: PACU  Anesthesia Type:Spinal  Level of Consciousness: awake, alert  and oriented  Airway & Oxygen Therapy: Patient Spontanous Breathing and Patient connected to face mask oxygen  Post-op Assessment: Report given to RN and Post -op Vital signs reviewed and stable  Post vital signs: Reviewed and stable  Last Vitals:  Vitals Value Taken Time  BP 138/64 10/13/19 1330  Temp 36.3 C 10/13/19 1235  Pulse 51 10/13/19 1333  Resp 11 10/13/19 1333  SpO2 94 % 10/13/19 1333  Vitals shown include unvalidated device data.  Last Pain:  Vitals:   10/13/19 1315  TempSrc:   PainSc: 0-No pain         Complications: No apparent anesthesia complications

## 2019-10-13 NOTE — Anesthesia Procedure Notes (Addendum)
Anesthesia Regional Block: Adductor canal block   Pre-Anesthetic Checklist: ,, timeout performed, Correct Patient, Correct Site, Correct Laterality, Correct Procedure, Correct Position, site marked, Risks and benefits discussed,  Surgical consent,  Pre-op evaluation,  At surgeon's request and post-op pain management  Laterality: Left  Prep: chloraprep       Needles:  Injection technique: Single-shot  Needle Type: Echogenic Stimulator Needle     Needle Length: 5cm  Needle Gauge: 22     Additional Needles:   Procedures:, nerve stimulator,,, ultrasound used (permanent image in chart),,,,  Narrative:  Start time: 10/13/2019 10:26 AM End time: 10/13/2019 10:34 AM Injection made incrementally with aspirations every 5 mL.  Performed by: Personally  Anesthesiologist: Janeece Riggers, MD  Additional Notes: Functioning IV was confirmed and monitors were applied.  A 52mm 22ga Arrow echogenic stimulator needle was used. Sterile prep and drape,hand hygiene and sterile gloves were used. Ultrasound guidance: relevant anatomy identified, needle position confirmed, local anesthetic spread visualized around nerve(s)., vascular puncture avoided.  Image printed for medical record. Negative aspiration and negative test dose prior to incremental administration of local anesthetic. The patient tolerated the procedure well.

## 2019-10-13 NOTE — Anesthesia Preprocedure Evaluation (Addendum)
Anesthesia Evaluation  Patient identified by MRN, date of birth, ID band Patient awake    Reviewed: Allergy & Precautions, H&P , NPO status , Patient's Chart, lab work & pertinent test results  History of Anesthesia Complications (+) PONV and history of anesthetic complications  Airway Mallampati: I  TM Distance: >3 FB Neck ROM: full    Dental  (+) Teeth Intact, Dental Advidsory Given   Pulmonary sleep apnea , former smoker,    breath sounds clear to auscultation       Cardiovascular hypertension, + CAD, + Past MI and + Cardiac Stents   Rhythm:regular Rate:Normal  EKG: 10/28/2018 Rate 78 bpm Normal sinus rhythm   CV: Stress Echo 07/04/2018 The patient had no chest pain. The patient achieved 114 % of maximum predicted heart rate. The METS achieved was 6.7. Exercise capacity was average. Negative stress ECG for inducible ischemia at target heart rate. Negative exercise echocardiography for inducible ischemia at target heart  rate. LV contractility is not vigrous at peak stress but no focal wall motion  apparent -  REST ECHO Normal left ventricular function at rest. There were no segmental wall motion  abnormalities at rest. The estimated LV ejection fraction is 55-60% . - STRESS ECHO Normal left ventricular function and global wall motion with stress. There  were no segmental wall motion abnormalities post exercise. There was normal  increase in global LV function post exercise. The estimated LV ejection  fraction is 65-70% with stress. Negative exercise echocardiography for  inducible ischemia at target heart rate.   Neuro/Psych    GI/Hepatic   Endo/Other  diabetes, Type 2Morbid obesity  Renal/GU      Musculoskeletal  (+) Arthritis ,   Abdominal   Peds  Hematology   Anesthesia Other Findings   Reproductive/Obstetrics                            Anesthesia  Physical  Anesthesia Plan  ASA: III  Anesthesia Plan: Spinal and MAC   Post-op Pain Management:  Regional for Post-op pain   Induction: Intravenous  PONV Risk Score and Plan: 3 and Ondansetron, Dexamethasone and Midazolam  Airway Management Planned: Nasal Cannula, Simple Face Mask and Mask  Additional Equipment:   Intra-op Plan:   Post-operative Plan:   Informed Consent: I have reviewed the patients History and Physical, chart, labs and discussed the procedure including the risks, benefits and alternatives for the proposed anesthesia with the patient or authorized representative who has indicated his/her understanding and acceptance.     Dental Advisory Given  Plan Discussed with: Anesthesiologist, CRNA and Surgeon  Anesthesia Plan Comments: (Discussed both nerve block for pain relief post-op and GA; including NV, sore throat, dental injury, and pulmonary complications)       Anesthesia Quick Evaluation

## 2019-10-13 NOTE — Progress Notes (Signed)
Physical Therapy Evaluation Patient Details Name: Mariah Kelley MRN: [id number] DOB: 05/01/1951 Today's Date: 10/13/2019   History of Present Illness  68 yo female s/p L TKR on 10/13/19. PMH includes COPD, L shoulder replacement, breast cancer, DMII, HTN, STEMI.  Clinical Impression  Pt presents with L knee pain post-op, decreased L knee ROM, increased time and effort to mobilize. Pt ambulated hallway distance with RW with min guard to supervision level of assist, which pt states her friend can provide on 24/7 basis. Pt proficiently navigated stairs, practiced x2 for pt safety. Pt also performed TKR exercises well, handout administered, reviewed, and practiced with pt. Pt educated on ankle pumps (20/hour) to perform this afternoon/evening to increase circulation, to pt's tolerance and limited by pain. Pt with no further questions, no further acute PT needs at this time. Pt safe to d/c home from PT perspective.       Follow Up Recommendations Follow surgeon's recommendation for DC plan and follow-up therapies;Supervision for mobility/OOB(HHPT-->OPPT)    Equipment Recommendations  None recommended by PT    Recommendations for Other Services       Precautions / Restrictions Precautions Precautions: Fall Restrictions Weight Bearing Restrictions: No Other Position/Activity Restrictions: WBAT      Mobility  Bed Mobility Overal bed mobility: Needs Assistance Bed Mobility: Supine to Sit     Supine to sit: Supervision     General bed mobility comments: supervision for safety, increased time and effort.  Transfers Overall transfer level: Needs assistance Equipment used: Rolling walker (2 wheeled) Transfers: Sit to/from Stand Sit to Stand: Supervision;Min guard         General transfer comment: supervision to min guard for safety, verbal cuing for hand placement when rising/sitting x1.  Ambulation/Gait Ambulation/Gait assistance: Supervision;Min guard Gait Distance  (Feet): 150 Feet Assistive device: Rolling walker (2 wheeled) Gait Pattern/deviations: Step-to pattern;Step-through pattern;Decreased stride length;Trunk flexed Gait velocity: decr   General Gait Details: Min guard to supervision for safety, verbal cuing for sequencing although pt quickly progressing to step-through gait, placement in RW, and upright posture.  Stairs Stairs: Yes Stairs assistance: Min guard Stair Management: One rail Right;Step to pattern;Forwards;With cane Number of Stairs: 3(2x3 steps) General stair comments: Min guard for safety, verbal cuing for sequencing ("up with the good leg, down with the bad leg first"), safe placement of cane when ascending/descending, and appropriate caregiver guarding in posterolateral position ascending and anterior position descending. Practiced stair navigation x2 for pt comfort with steps and safety.  Wheelchair Mobility    Modified Rankin (Stroke Patients Only)       Balance Overall balance assessment: Mild deficits observed, not formally tested                                           Pertinent Vitals/Pain Pain Assessment: 0-10 Pain Score: 4  Pain Location: L knee Pain Descriptors / Indicators: Sore Pain Intervention(s): Limited activity within patient's tolerance;Monitored during session;Premedicated before session;Repositioned    Home Living Family/patient expects to be discharged to:: Private residence Living Arrangements: Non-relatives/Friends Available Help at Discharge: Available 24 hours/day;Friend(s) Type of Home: House Home Access: Stairs to enter Entrance Stairs-Rails: Right Entrance Stairs-Number of Steps: 3 Home Layout: One level Home Equipment: Walker - 2 wheels;Grab bars - tub/shower;Crutches;Cane - single point;Shower seat;Toilet riser      Prior Function Level of Independence: Independent                 Hand Dominance   Dominant Hand: Right    Extremity/Trunk Assessment    Upper Extremity Assessment Upper Extremity Assessment: Overall WFL for tasks assessed    Lower Extremity Assessment Lower Extremity Assessment: Overall WFL for tasks assessed;LLE deficits/detail LLE Deficits / Details: suspected post-surgical weakness; able to perform ankle pumps, quad set, heel slide, SLR without lift assist or quad lag >10* LLE Sensation: WNL    Cervical / Trunk Assessment Cervical / Trunk Assessment: Normal  Communication   Communication: No difficulties  Cognition Arousal/Alertness: Awake/alert Behavior During Therapy: WFL for tasks assessed/performed Overall Cognitive Status: Within Functional Limits for tasks assessed                                        General Comments      Exercises Total Joint Exercises Ankle Circles/Pumps: AROM;Both;5 reps;Seated Quad Sets: AROM;Left;5 reps;Seated Towel Squeeze: AROM;Both;5 reps;Seated Short Arc Quad: AROM;Left;5 reps;Seated Heel Slides: AAROM;5 reps;Seated;Left(with foot loop of gait belt) Hip ABduction/ADduction: AROM;Left;5 reps;Seated Straight Leg Raises: AROM;Left;5 reps;Seated;AAROM(with gait belt foot loop assist) Knee Flexion: AROM;Left;5 reps;Seated(with towel under foot to assist in sliding on floor)   Assessment/Plan    PT Assessment All further PT needs can be met in the next venue of care  PT Problem List Decreased strength;Decreased mobility;Decreased range of motion;Decreased activity tolerance;Decreased balance;Pain       PT Treatment Interventions (N/a)    PT Goals (Current goals can be found in the Care Plan section)  Acute Rehab PT Goals Patient Stated Goal: go home today PT Goal Formulation: With patient Time For Goal Achievement: 10/13/19 Potential to Achieve Goals: Good    Frequency (N/a)   Barriers to discharge        Co-evaluation               AM-PAC PT "6 Clicks" Mobility  Outcome Measure Help needed turning from your back to your side while in a  flat bed without using bedrails?: None Help needed moving from lying on your back to sitting on the side of a flat bed without using bedrails?: None Help needed moving to and from a bed to a chair (including a wheelchair)?: A Little Help needed standing up from a chair using your arms (e.g., wheelchair or bedside chair)?: A Little Help needed to walk in hospital room?: A Little Help needed climbing 3-5 steps with a railing? : A Little 6 Click Score: 20    End of Session Equipment Utilized During Treatment: Gait belt Activity Tolerance: Patient tolerated treatment well Patient left: in chair;with call bell/phone within reach Nurse Communication: Mobility status PT Visit Diagnosis: Other abnormalities of gait and mobility (R26.89);Difficulty in walking, not elsewhere classified (R26.2)    Time: 1452-1524 PT Time Calculation (min) (ACUTE ONLY): 32 min   Charges:   PT Evaluation $PT Eval Low Complexity: 1 Low PT Treatments $Gait Training: 8-22 mins        Alexa E, PT Acute Rehabilitation Services Pager [phone]  Office [phone]  Alexa D Eure 10/13/2019, 4:32 PM   

## 2019-10-13 NOTE — H&P (Signed)
Mariah Kelley MRN:  [id number] DOB/SEX:  December 04, 1950/female  CHIEF COMPLAINT:  Painful left Knee  HISTORY: Patient is a 68 y.o. female presented with a history of pain in the left knee. Onset of symptoms was gradual starting a few years ago with gradually worsening course since that time. Patient has been treated conservatively with over-the-counter NSAIDs and activity modification. Patient currently rates pain in the knee at 10 out of 10 with activity. There is pain at night.  PAST MEDICAL HISTORY: Patient Active Problem List   Diagnosis Date Noted  . Pre-operative cardiovascular examination 10/28/2018  . History of breast cancer 10/28/2018  . COPD with chronic bronchitis and emphysema ([place]) 11/14/2017  . History of tobacco abuse 11/14/2017  . OSA treated with BiPAP   . Morbid obesity ([place]) 07/23/2017  . S/P shoulder replacement, left 04/19/2017  . Malignant neoplasm of upper-outer quadrant of right breast in female, estrogen receptor positive ([place]) 12/23/2014  . Non-insulin dependent type 2 diabetes mellitus ([place]) 01/18/2014  . Dyslipidemia 01/18/2014  . Essential hypertension 01/18/2014  . History of ST elevation myocardial infarction (STEMI) 01/17/2014  . CAD- S/P PCI 2015 01/17/2014   Past Medical History:  Diagnosis Date  . Arthritis   . Asthma   . Breast cancer of upper-outer quadrant of right female breast ([place]) 12/23/2014  . Bruises easily   . CAD S/P percutaneous coronary angioplasty 01/17/2014   takes Brilinta and Plavix daily  . Cataracts, bilateral   . Complication of anesthesia   . COPD (chronic obstructive pulmonary disease) ([place])   . Diabetes mellitus without complication ([place])    pre DM per pt. now after weight loss surgery   no meds  . History of bronchitis 7-83yrs ago  . History of tobacco abuse    40 pack-years  . Hyperlipidemia    takes Lipitor daily  . Hypertension    takes Metoprolol daily  . Joint pain   . Obesity   . OSA treated with BiPAP    moderate with AHI 19/hr now on BIPAP auto  . PONV (postoperative nausea and vomiting)    [place]  . ST elevation myocardial infarction (STEMI) of inferolateral wall ([place]) 01/17/2014   Past Surgical History:  Procedure Laterality Date  . ABDOMINAL HYSTERECTOMY    . APPENDECTOMY    . BREAST REDUCTION SURGERY Left 01/27/2015  . BREAST REDUCTION SURGERY Left 01/27/2015   Procedure: MAMMARY REDUCTION  LEFT (BREAST);  Surgeon: Irene Limbo, MD;  Location: [facility];  Service: Plastics;  Laterality: Left;  . CATARACT EXTRACTION W/PHACO Right 04/11/2016   Procedure: CATARACT EXTRACTION PHACO AND INTRAOCULAR LENS PLACEMENT (IOC);  Surgeon: Rutherford Guys, MD;  Location: [facility];  Service: Ophthalmology;  Laterality: Right;  CDE: 14.15  . CATARACT EXTRACTION W/PHACO Left 05/02/2016   Procedure: CATARACT EXTRACTION PHACO AND INTRAOCULAR LENS PLACEMENT (IOC);  Surgeon: Rutherford Guys, MD;  Location: [facility];  Service: Ophthalmology;  Laterality: Left;  CDE: 12.11  . CHOLECYSTECTOMY    . COLONOSCOPY    . EYE SURGERY    . LEFT HEART CATH Bilateral 01/17/2014   Procedure: LEFT HEART CATH;  Surgeon: Lorretta Harp, MD;  Location: [facility];  Service: Cardiovascular;  Laterality: Bilateral;  . MASTECTOMY W/ SENTINEL NODE BIOPSY Right 01/27/2015   dr Lucia Gaskins   . MASTECTOMY W/ SENTINEL NODE BIOPSY Right 01/27/2015   Procedure: RIGHT MASTECTOMY WITH RIGHT AXILLARY  SENTINEL LYMPH NODE BIOPSY;  Surgeon: Alphonsa Overall, MD;  Location: [facility];  Service: General;  Laterality: Right;  .  PERCUTANEOUS CORONARY STENT INTERVENTION (PCI-S)    . PORTACATH PLACEMENT Left 03/23/2015   Procedure: INSERTION PORT-A-CATH ;  Surgeon: Alphonsa Overall, MD;  Location: [facility];  Service: General;  Laterality: Left;  . sleeve gastrectomy    . SPINAL FUSION    . TOTAL SHOULDER ARTHROPLASTY Left 04/19/2017   Procedure: TOTAL SHOULDER ARTHROPLASTY;  Surgeon: Tania Ade, MD;  Location: [facility];  Service: Orthopedics;  Laterality: Left;  Left total  shoulder arthroplasty  . TUBAL LIGATION       MEDICATIONS:   Medications Prior to Admission  Medication Sig Dispense Refill Last Dose  . anastrozole (ARIMIDEX) 1 MG tablet Take 1 tablet (1 mg total) by mouth daily. 90 tablet 3 10/13/2019 at 0530  . aspirin EC 81 MG tablet Take 81 mg by mouth daily.   10/05/2019 at Unknown time  . atorvastatin (LIPITOR) 80 MG tablet TAKE 1 TABLET (80 MG TOTAL) BY MOUTH DAILY AT 6 PM. (Patient taking differently: Take 80 mg by mouth daily at 6 PM. ) 90 tablet 3 10/12/2019 at Unknown time  . calcium citrate-vitamin D 500-500 MG-UNIT chewable tablet Chew 1 tablet by mouth 3 (three) times daily.   10/12/2019 at Unknown time  . Ferrous Sulfate (IRON) 142 (45 Fe) MG TBCR Take 142 mg by mouth daily.   10/13/2019 at 0530  . Fluticasone Furoate (ARNUITY ELLIPTA) 100 MCG/ACT AEPB Inhale 1 puff into the lungs daily. 1 each 0 10/13/2019 at 0530  . metoprolol tartrate (LOPRESSOR) 50 MG tablet Take 1 tablet (50 mg total) by mouth 2 (two) times daily. 180 tablet 3 10/13/2019 at 0530  . Multiple Vitamin (MULTIVITAMIN WITH MINERALS) TABS tablet Take 1 tablet by mouth 2 (two) times daily.   10/12/2019 at Unknown time  . nitroGLYCERIN (NITROSTAT) 0.4 MG SL tablet PLACE 1 TABLET UNDER THE TONGUE EVERY 5 MINUTES AS NEEDED FOR CHEST PAIN. (Patient taking differently: Place 0.4 mg under the tongue every 5 (five) minutes as needed for chest pain. ) 25 tablet 1 never  . albuterol (PROVENTIL HFA;VENTOLIN HFA) 108 (90 Base) MCG/ACT inhaler Inhale 2 puffs into the lungs every 6 (six) hours as needed for wheezing or shortness of breath. 1 Inhaler 2 More than a month at 0530  . Spacer/Aero-Holding Chambers (AEROCHAMBER MV) inhaler Use as instructed 1 each 0 supply    ALLERGIES:   Allergies  Allergen Reactions  . Prednisone Other (See Comments)    "made poison ivy worse", rebound effect when take prednisone  . Codeine Nausea Only and Other (See Comments)    HEADACHE    REVIEW OF SYSTEMS:  A  comprehensive review of systems was negative except for: Musculoskeletal: positive for arthralgias and bone pain   FAMILY HISTORY:   Family History  Problem Relation Age of Onset  . Heart attack Mother 49       passed at 28  . Heart attack Father 35       s/p CABG, multiple PCIs  . CAD Brother        Still living  . CAD Brother        Still living    SOCIAL HISTORY:   Social History   Tobacco Use  . Smoking status: Former Smoker    Packs/day: 1.00    Years: 40.00    Pack years: 40.00    Types: Cigarettes    Quit date: 07/30/2009    Years since quitting: 10.2  . Smokeless tobacco: Former Systems developer  . Tobacco comment: quit 5 yrs  ago  Substance Use Topics  . Alcohol use: No     EXAMINATION:  Vital signs in last 24 hours: Temp:  [97.6 F (36.4 C)] 97.6 F (36.4 C) (12/07 0741) Pulse Rate:  [57] 57 (12/07 0741) Resp:  [15] 15 (12/07 0741) BP: (147)/(66) 147/66 (12/07 0741) SpO2:  [100 %] 100 % (12/07 0741) Weight:  [89.4 kg] 89.4 kg (12/07 0749)  BP (!) 147/66   Pulse (!) 57   Temp 97.6 F (36.4 C) (Oral)   Resp 15   Ht 5' 8.5" (1.74 m)   Wt 89.4 kg   SpO2 100%   BMI 29.52 kg/m   General Appearance:    Alert, cooperative, no distress, appears stated age  Head:    Normocephalic, without obvious abnormality, atraumatic  Eyes:    PERRL, conjunctiva/corneas clear, EOM's intact, fundi    benign, both eyes  Ears:    Normal TM's and external ear canals, both ears  Nose:   Nares normal, septum midline, mucosa normal, no drainage    or sinus tenderness  Throat:   Lips, mucosa, and tongue normal; teeth and gums normal  Neck:   Supple, symmetrical, trachea midline, no adenopathy;    thyroid:  no enlargement/tenderness/nodules; no carotid   bruit or JVD  Back:     Symmetric, no curvature, ROM normal, no CVA tenderness  Lungs:     Clear to auscultation bilaterally, respirations unlabored  Chest Wall:    No tenderness or deformity   Heart:    Regular rate and rhythm, S1  and S2 normal, no murmur, rub   or gallop  Breast Exam:    No tenderness, masses, or nipple abnormality  Abdomen:     Soft, non-tender, bowel sounds active all four quadrants,    no masses, no organomegaly  Genitalia:    Normal female without lesion, discharge or tenderness  Rectal:    Normal tone, no masses or tenderness;   guaiac negative stool  Extremities:   Extremities normal, atraumatic, no cyanosis or edema  Pulses:   2+ and symmetric all extremities  Skin:   Skin color, texture, turgor normal, no rashes or lesions  Lymph nodes:   Cervical, supraclavicular, and axillary nodes normal  Neurologic:   CNII-XII intact, normal strength, sensation and reflexes    throughout    Musculoskeletal:  ROM 0-120, Ligaments intact,  Imaging Review Plain radiographs demonstrate severe degenerative joint disease of the left knee. The overall alignment is neutral. The bone quality appears to be good for age and reported activity level.  Assessment/Plan: Primary osteoarthritis, left knee   The patient history, physical examination and imaging studies are consistent with advanced degenerative joint disease of the left knee. The patient has failed conservative treatment.  The clearance notes were reviewed.  After discussion with the patient it was felt that Total Knee Replacement was indicated. The procedure,  risks, and benefits of total knee arthroplasty were presented and reviewed. The risks including but not limited to aseptic loosening, infection, blood clots, vascular injury, stiffness, patella tracking problems complications among others were discussed. The patient acknowledged the explanation, agreed to proceed with the plan.  Preoperative templating of the joint replacement has been completed, documented, and submitted to the Operating Room personnel in order to optimize intra-operative equipment management.    Patient's anticipated LOS is less than 2 midnights, meeting these requirements: -  Lives within 1 hour of care - Has a competent adult at home to recover with post-op recover -  NO history of  - Chronic pain requiring opiods  - Diabetes  - Coronary Artery Disease  - Heart failure  - Heart attack  - Stroke  - DVT/VTE  - Cardiac arrhythmia  - Respiratory Failure/COPD  - Renal failure  - Anemia  - Advanced Liver disease       Donia Ast 10/13/2019, 8:51 AM

## 2019-10-13 NOTE — Op Note (Signed)
TOTAL KNEE REPLACEMENT OPERATIVE NOTE:  10/13/2019  1:17 PM  PATIENT:  Mariah Kelley  68 y.o. female  PRE-OPERATIVE DIAGNOSIS:  Primary Osteoarthritis Left Knee  POST-OPERATIVE DIAGNOSIS:  Primary Osteoarthritis Left Knee  PROCEDURE:  Procedure(s): TOTAL KNEE ARTHROPLASTY  SURGEON:  Surgeon(s): Vickey Huger, MD  PHYSICIAN ASSISTANT:  Nehemiah Massed, PA-C  ANESTHESIA:   general  SPECIMEN: None  COUNTS:  Correct  TOURNIQUET:   Total Tourniquet Time Documented: Thigh (Left) - 39 minutes Total: Thigh (Left) - 39 minutes   DICTATION:  Indication for procedure:    The patient is a 68 y.o. female who has failed conservative treatment for Primary Osteoarthritis Left Knee.  Informed consent was obtained prior to anesthesia. The risks versus benefits of the operation were explain and in a way the patient can, and did, understand.     Description of procedure:     The patient was taken to the operating room and placed under anesthesia.  The patient was positioned in the usual fashion taking care that all body parts were adequately padded and/or protected.  A tourniquet was applied and the leg prepped and draped in the usual sterile fashion.  The extremity was exsanguinated with the esmarch and tourniquet inflated to 350 mmHg.  Pre-operative range of motion was normal.    A midline incision approximately 6-7 inches long was made with a #10 blade.  A new blade was used to make a parapatellar arthrotomy going 2-3 cm into the quadriceps tendon, over the patella, and alongside the medial aspect of the patellar tendon.  A synovectomy was then performed with the #10 blade and forceps. I then elevated the deep MCL off the medial tibial metaphysis subperiosteally around to the semimembranosus attachment.    I everted the patella and used calipers to measure patellar thickness.  I used the reamer to ream down to appropriate thickness to recreate the native thickness.  I then removed  excess bone with the rongeur and sagittal saw.  I used the appropriately sized template and drilled the three lug holes.  I then put the trial in place and measured the thickness with the calipers to ensure recreation of the native thickness.  The trial was then removed and the patella subluxed and the knee brought into flexion.  A homan retractor was place to retract and protect the patella and lateral structures.  A Z-retractor was place medially to protect the medial structures.  The extra-medullary alignment system was used to make cut the tibial articular surface perpendicular to the anamotic axis of the tibia and in 3 degrees of posterior slope.  The cut surface and alignment jig was removed.  I then used the intramedullary alignment guide to make a  valgus cut on the distal femur.  I then marked out the epicondylar axis on the distal femur.   I then used the anterior referencing sizer and measured the femur to be a size 8.  The 4-In-1 cutting block was screwed into place in external rotation matching the posterior condylar angle, making our cuts perpendicular to the epicondylar axis.  Anterior, posterior and chamfer cuts were made with the sagittal saw.  The cutting block and cut pieces were removed.  A lamina spreader was placed in 90 degrees of flexion.  The ACL, PCL, menisci, and posterior condylar osteophytes were removed.  A 10 mm spacer blocked was found to offer good flexion and extension gap balance after minimal in degree releasing.   The scoop retractor was then placed  and the femoral finishing block was pinned in place.  The small sagittal saw was used as well as the lug drill to finish the femur.  The block and cut surfaces were removed and the medullary canal hole filled with autograft bone from the cut pieces.  The tibia was delivered forward in deep flexion and external rotation.  A size E tray was selected and pinned into place centered on the medial 1/3 of the tibial tubercle.  The  reamer and keel was used to prepare the tibia through the tray.    I then trialed with the size 8 femur, size E tibia, a 10 mm insert and the 35 patella.  I had excellent flexion/extension gap balance, excellent patella tracking.  Flexion was full and beyond 120 degrees; extension was zero.  These components were chosen and the staff opened them to me on the back table while the knee was lavaged copiously and the cement mixed.  The soft tissue was infiltrated with 60cc of exparel 1.3% through a 21 gauge needle.  I cemented in the components and removed all excess cement.  The polyethylene tibial component was snapped into place and the knee placed in extension while cement was hardening.  The capsule was infilltrated with a 60cc exparel/marcaine/saline mixture.   Once the cement was hard, the tourniquet was let down.  Hemostasis was obtained.  The arthrotomy was closed using a #1 stratofix running suture.  The deep soft tissues were closed with #0 vicryls and the subcuticular layer closed with #2-0 vicryl.  The skin was reapproximated and closed with 3.0 Monocryl.  The wound was covered with steristrips, aquacel dressing, and a TED stocking.   The patient was then awakened, extubated, and taken to the recovery room in stable condition.  BLOOD LOSS:  0000000 COMPLICATIONS:  None.  PLAN OF CARE: Discharge to home after PACU  PATIENT DISPOSITION:  PACU - hemodynamically stable.    Please fax a copy of this op note to my office at [phone] (please only include page 1 and 2 of the Case Information op note)

## 2019-10-13 NOTE — Progress Notes (Signed)
Assisted Dr. Oddono with left, ultrasound guided, adductor canal block. Side rails up, monitors on throughout procedure. See vital signs in flow sheet. Tolerated Procedure well.  

## 2019-10-14 ENCOUNTER — Encounter (HOSPITAL_COMMUNITY): Payer: Self-pay | Admitting: Orthopedic Surgery

## 2019-10-14 DIAGNOSIS — E119 Type 2 diabetes mellitus without complications: Secondary | ICD-10-CM | POA: Diagnosis not present

## 2019-10-14 DIAGNOSIS — J439 Emphysema, unspecified: Secondary | ICD-10-CM | POA: Diagnosis not present

## 2019-10-14 DIAGNOSIS — I252 Old myocardial infarction: Secondary | ICD-10-CM | POA: Diagnosis not present

## 2019-10-14 DIAGNOSIS — Z87891 Personal history of nicotine dependence: Secondary | ICD-10-CM | POA: Diagnosis not present

## 2019-10-14 DIAGNOSIS — Z6829 Body mass index (BMI) 29.0-29.9, adult: Secondary | ICD-10-CM | POA: Diagnosis not present

## 2019-10-14 DIAGNOSIS — I10 Essential (primary) hypertension: Secondary | ICD-10-CM | POA: Diagnosis not present

## 2019-10-14 DIAGNOSIS — G4733 Obstructive sleep apnea (adult) (pediatric): Secondary | ICD-10-CM | POA: Diagnosis not present

## 2019-10-14 DIAGNOSIS — Z96652 Presence of left artificial knee joint: Secondary | ICD-10-CM | POA: Diagnosis not present

## 2019-10-14 DIAGNOSIS — E785 Hyperlipidemia, unspecified: Secondary | ICD-10-CM | POA: Diagnosis not present

## 2019-10-14 DIAGNOSIS — I2510 Atherosclerotic heart disease of native coronary artery without angina pectoris: Secondary | ICD-10-CM | POA: Diagnosis not present

## 2019-10-14 DIAGNOSIS — Z471 Aftercare following joint replacement surgery: Secondary | ICD-10-CM | POA: Diagnosis not present

## 2019-10-14 DIAGNOSIS — Z853 Personal history of malignant neoplasm of breast: Secondary | ICD-10-CM | POA: Diagnosis not present

## 2019-10-14 NOTE — Addendum Note (Signed)
Addendum  created 10/14/19 1252 by Janeece Riggers, MD   Child order released for a procedure order, Clinical Note Signed, Intraprocedure Blocks edited

## 2019-10-14 NOTE — Anesthesia Procedure Notes (Signed)
Spinal  Patient location during procedure: OR Start time: 10/13/2019 10:45 AM End time: 10/13/2019 10:50 AM Staffing Anesthesiologist: Janeece Riggers, MD Preanesthetic Checklist Completed: patient identified, site marked, surgical consent, pre-op evaluation, timeout performed, IV checked, risks and benefits discussed and monitors and equipment checked Spinal Block Patient position: sitting Prep: DuraPrep Patient monitoring: heart rate, cardiac monitor, continuous pulse ox and blood pressure Approach: midline Location: L4-5 Injection technique: single-shot Needle Needle type: Sprotte  Needle gauge: 24 G Needle length: 9 cm Assessment Sensory level: T4

## 2019-10-16 DIAGNOSIS — J439 Emphysema, unspecified: Secondary | ICD-10-CM | POA: Diagnosis not present

## 2019-10-16 DIAGNOSIS — Z471 Aftercare following joint replacement surgery: Secondary | ICD-10-CM | POA: Diagnosis not present

## 2019-10-16 DIAGNOSIS — E785 Hyperlipidemia, unspecified: Secondary | ICD-10-CM | POA: Diagnosis not present

## 2019-10-16 DIAGNOSIS — I10 Essential (primary) hypertension: Secondary | ICD-10-CM | POA: Diagnosis not present

## 2019-10-16 DIAGNOSIS — I252 Old myocardial infarction: Secondary | ICD-10-CM | POA: Diagnosis not present

## 2019-10-16 DIAGNOSIS — E119 Type 2 diabetes mellitus without complications: Secondary | ICD-10-CM | POA: Diagnosis not present

## 2019-10-17 DIAGNOSIS — I10 Essential (primary) hypertension: Secondary | ICD-10-CM | POA: Diagnosis not present

## 2019-10-17 DIAGNOSIS — I252 Old myocardial infarction: Secondary | ICD-10-CM | POA: Diagnosis not present

## 2019-10-17 DIAGNOSIS — J439 Emphysema, unspecified: Secondary | ICD-10-CM | POA: Diagnosis not present

## 2019-10-17 DIAGNOSIS — E785 Hyperlipidemia, unspecified: Secondary | ICD-10-CM | POA: Diagnosis not present

## 2019-10-17 DIAGNOSIS — Z471 Aftercare following joint replacement surgery: Secondary | ICD-10-CM | POA: Diagnosis not present

## 2019-10-17 DIAGNOSIS — E119 Type 2 diabetes mellitus without complications: Secondary | ICD-10-CM | POA: Diagnosis not present

## 2019-10-20 DIAGNOSIS — I10 Essential (primary) hypertension: Secondary | ICD-10-CM | POA: Diagnosis not present

## 2019-10-20 DIAGNOSIS — I252 Old myocardial infarction: Secondary | ICD-10-CM | POA: Diagnosis not present

## 2019-10-20 DIAGNOSIS — J439 Emphysema, unspecified: Secondary | ICD-10-CM | POA: Diagnosis not present

## 2019-10-20 DIAGNOSIS — E119 Type 2 diabetes mellitus without complications: Secondary | ICD-10-CM | POA: Diagnosis not present

## 2019-10-20 DIAGNOSIS — Z471 Aftercare following joint replacement surgery: Secondary | ICD-10-CM | POA: Diagnosis not present

## 2019-10-20 DIAGNOSIS — E785 Hyperlipidemia, unspecified: Secondary | ICD-10-CM | POA: Diagnosis not present

## 2019-10-22 DIAGNOSIS — I252 Old myocardial infarction: Secondary | ICD-10-CM | POA: Diagnosis not present

## 2019-10-22 DIAGNOSIS — Z471 Aftercare following joint replacement surgery: Secondary | ICD-10-CM | POA: Diagnosis not present

## 2019-10-22 DIAGNOSIS — I10 Essential (primary) hypertension: Secondary | ICD-10-CM | POA: Diagnosis not present

## 2019-10-22 DIAGNOSIS — J439 Emphysema, unspecified: Secondary | ICD-10-CM | POA: Diagnosis not present

## 2019-10-22 DIAGNOSIS — E785 Hyperlipidemia, unspecified: Secondary | ICD-10-CM | POA: Diagnosis not present

## 2019-10-22 DIAGNOSIS — E119 Type 2 diabetes mellitus without complications: Secondary | ICD-10-CM | POA: Diagnosis not present

## 2019-11-11 DIAGNOSIS — R262 Difficulty in walking, not elsewhere classified: Secondary | ICD-10-CM | POA: Diagnosis not present

## 2019-11-11 DIAGNOSIS — R29898 Other symptoms and signs involving the musculoskeletal system: Secondary | ICD-10-CM | POA: Diagnosis not present

## 2019-11-11 DIAGNOSIS — M25462 Effusion, left knee: Secondary | ICD-10-CM | POA: Diagnosis not present

## 2019-11-11 DIAGNOSIS — M25662 Stiffness of left knee, not elsewhere classified: Secondary | ICD-10-CM | POA: Diagnosis not present

## 2019-11-11 DIAGNOSIS — M25562 Pain in left knee: Secondary | ICD-10-CM | POA: Diagnosis not present

## 2019-11-11 DIAGNOSIS — Z96652 Presence of left artificial knee joint: Secondary | ICD-10-CM | POA: Diagnosis not present

## 2019-11-13 DIAGNOSIS — Z96652 Presence of left artificial knee joint: Secondary | ICD-10-CM | POA: Diagnosis not present

## 2019-11-13 DIAGNOSIS — R262 Difficulty in walking, not elsewhere classified: Secondary | ICD-10-CM | POA: Diagnosis not present

## 2019-11-13 DIAGNOSIS — R29898 Other symptoms and signs involving the musculoskeletal system: Secondary | ICD-10-CM | POA: Diagnosis not present

## 2019-11-13 DIAGNOSIS — M25662 Stiffness of left knee, not elsewhere classified: Secondary | ICD-10-CM | POA: Diagnosis not present

## 2019-11-13 DIAGNOSIS — M25462 Effusion, left knee: Secondary | ICD-10-CM | POA: Diagnosis not present

## 2019-11-13 DIAGNOSIS — M25562 Pain in left knee: Secondary | ICD-10-CM | POA: Diagnosis not present

## 2019-11-18 DIAGNOSIS — M25662 Stiffness of left knee, not elsewhere classified: Secondary | ICD-10-CM | POA: Diagnosis not present

## 2019-11-18 DIAGNOSIS — R29898 Other symptoms and signs involving the musculoskeletal system: Secondary | ICD-10-CM | POA: Diagnosis not present

## 2019-11-18 DIAGNOSIS — Z96652 Presence of left artificial knee joint: Secondary | ICD-10-CM | POA: Diagnosis not present

## 2019-11-18 DIAGNOSIS — R262 Difficulty in walking, not elsewhere classified: Secondary | ICD-10-CM | POA: Diagnosis not present

## 2019-11-18 DIAGNOSIS — M25562 Pain in left knee: Secondary | ICD-10-CM | POA: Diagnosis not present

## 2019-11-18 DIAGNOSIS — M25462 Effusion, left knee: Secondary | ICD-10-CM | POA: Diagnosis not present

## 2019-11-19 DIAGNOSIS — F4323 Adjustment disorder with mixed anxiety and depressed mood: Secondary | ICD-10-CM | POA: Diagnosis not present

## 2019-11-20 DIAGNOSIS — M25462 Effusion, left knee: Secondary | ICD-10-CM | POA: Diagnosis not present

## 2019-11-20 DIAGNOSIS — R262 Difficulty in walking, not elsewhere classified: Secondary | ICD-10-CM | POA: Diagnosis not present

## 2019-11-20 DIAGNOSIS — M25662 Stiffness of left knee, not elsewhere classified: Secondary | ICD-10-CM | POA: Diagnosis not present

## 2019-11-20 DIAGNOSIS — Z96652 Presence of left artificial knee joint: Secondary | ICD-10-CM | POA: Diagnosis not present

## 2019-11-20 DIAGNOSIS — R29898 Other symptoms and signs involving the musculoskeletal system: Secondary | ICD-10-CM | POA: Diagnosis not present

## 2019-11-20 DIAGNOSIS — M25562 Pain in left knee: Secondary | ICD-10-CM | POA: Diagnosis not present

## 2019-11-21 DIAGNOSIS — E119 Type 2 diabetes mellitus without complications: Secondary | ICD-10-CM | POA: Diagnosis not present

## 2019-11-21 DIAGNOSIS — Z961 Presence of intraocular lens: Secondary | ICD-10-CM | POA: Diagnosis not present

## 2019-11-24 ENCOUNTER — Other Ambulatory Visit: Payer: Self-pay | Admitting: Pulmonary Disease

## 2019-11-25 DIAGNOSIS — Z7409 Other reduced mobility: Secondary | ICD-10-CM | POA: Diagnosis not present

## 2019-11-25 DIAGNOSIS — R29898 Other symptoms and signs involving the musculoskeletal system: Secondary | ICD-10-CM | POA: Diagnosis not present

## 2019-11-25 DIAGNOSIS — M25562 Pain in left knee: Secondary | ICD-10-CM | POA: Diagnosis not present

## 2019-11-25 DIAGNOSIS — Z96652 Presence of left artificial knee joint: Secondary | ICD-10-CM | POA: Diagnosis not present

## 2019-11-25 DIAGNOSIS — M25662 Stiffness of left knee, not elsewhere classified: Secondary | ICD-10-CM | POA: Diagnosis not present

## 2019-11-25 DIAGNOSIS — M25462 Effusion, left knee: Secondary | ICD-10-CM | POA: Diagnosis not present

## 2019-11-27 DIAGNOSIS — Z96652 Presence of left artificial knee joint: Secondary | ICD-10-CM | POA: Diagnosis not present

## 2019-11-27 DIAGNOSIS — Z7409 Other reduced mobility: Secondary | ICD-10-CM | POA: Diagnosis not present

## 2019-11-27 DIAGNOSIS — M25562 Pain in left knee: Secondary | ICD-10-CM | POA: Diagnosis not present

## 2019-11-27 DIAGNOSIS — R29898 Other symptoms and signs involving the musculoskeletal system: Secondary | ICD-10-CM | POA: Diagnosis not present

## 2019-11-27 DIAGNOSIS — M25462 Effusion, left knee: Secondary | ICD-10-CM | POA: Diagnosis not present

## 2019-11-27 DIAGNOSIS — M25662 Stiffness of left knee, not elsewhere classified: Secondary | ICD-10-CM | POA: Diagnosis not present

## 2019-12-02 DIAGNOSIS — M25462 Effusion, left knee: Secondary | ICD-10-CM | POA: Diagnosis not present

## 2019-12-02 DIAGNOSIS — M25662 Stiffness of left knee, not elsewhere classified: Secondary | ICD-10-CM | POA: Diagnosis not present

## 2019-12-02 DIAGNOSIS — Z96652 Presence of left artificial knee joint: Secondary | ICD-10-CM | POA: Diagnosis not present

## 2019-12-02 DIAGNOSIS — R262 Difficulty in walking, not elsewhere classified: Secondary | ICD-10-CM | POA: Diagnosis not present

## 2019-12-02 DIAGNOSIS — R29898 Other symptoms and signs involving the musculoskeletal system: Secondary | ICD-10-CM | POA: Diagnosis not present

## 2019-12-02 DIAGNOSIS — Z7409 Other reduced mobility: Secondary | ICD-10-CM | POA: Diagnosis not present

## 2019-12-02 DIAGNOSIS — M25562 Pain in left knee: Secondary | ICD-10-CM | POA: Diagnosis not present

## 2019-12-04 DIAGNOSIS — M25562 Pain in left knee: Secondary | ICD-10-CM | POA: Diagnosis not present

## 2019-12-04 DIAGNOSIS — M25662 Stiffness of left knee, not elsewhere classified: Secondary | ICD-10-CM | POA: Diagnosis not present

## 2019-12-04 DIAGNOSIS — Z7409 Other reduced mobility: Secondary | ICD-10-CM | POA: Diagnosis not present

## 2019-12-04 DIAGNOSIS — M25462 Effusion, left knee: Secondary | ICD-10-CM | POA: Diagnosis not present

## 2019-12-04 DIAGNOSIS — Z96652 Presence of left artificial knee joint: Secondary | ICD-10-CM | POA: Diagnosis not present

## 2019-12-04 DIAGNOSIS — R29898 Other symptoms and signs involving the musculoskeletal system: Secondary | ICD-10-CM | POA: Diagnosis not present

## 2019-12-04 DIAGNOSIS — R262 Difficulty in walking, not elsewhere classified: Secondary | ICD-10-CM | POA: Diagnosis not present

## 2019-12-09 DIAGNOSIS — R262 Difficulty in walking, not elsewhere classified: Secondary | ICD-10-CM | POA: Diagnosis not present

## 2019-12-09 DIAGNOSIS — Z96652 Presence of left artificial knee joint: Secondary | ICD-10-CM | POA: Diagnosis not present

## 2019-12-09 DIAGNOSIS — M25562 Pain in left knee: Secondary | ICD-10-CM | POA: Diagnosis not present

## 2019-12-09 DIAGNOSIS — R29898 Other symptoms and signs involving the musculoskeletal system: Secondary | ICD-10-CM | POA: Diagnosis not present

## 2019-12-09 DIAGNOSIS — Z7409 Other reduced mobility: Secondary | ICD-10-CM | POA: Diagnosis not present

## 2019-12-09 DIAGNOSIS — M25662 Stiffness of left knee, not elsewhere classified: Secondary | ICD-10-CM | POA: Diagnosis not present

## 2019-12-09 DIAGNOSIS — M25462 Effusion, left knee: Secondary | ICD-10-CM | POA: Diagnosis not present

## 2019-12-12 DIAGNOSIS — M25662 Stiffness of left knee, not elsewhere classified: Secondary | ICD-10-CM | POA: Diagnosis not present

## 2019-12-12 DIAGNOSIS — M25462 Effusion, left knee: Secondary | ICD-10-CM | POA: Diagnosis not present

## 2019-12-12 DIAGNOSIS — Z7409 Other reduced mobility: Secondary | ICD-10-CM | POA: Diagnosis not present

## 2019-12-12 DIAGNOSIS — M25562 Pain in left knee: Secondary | ICD-10-CM | POA: Diagnosis not present

## 2019-12-12 DIAGNOSIS — Z96652 Presence of left artificial knee joint: Secondary | ICD-10-CM | POA: Diagnosis not present

## 2019-12-12 DIAGNOSIS — R29898 Other symptoms and signs involving the musculoskeletal system: Secondary | ICD-10-CM | POA: Diagnosis not present

## 2019-12-16 DIAGNOSIS — M25562 Pain in left knee: Secondary | ICD-10-CM | POA: Diagnosis not present

## 2019-12-16 DIAGNOSIS — Z96652 Presence of left artificial knee joint: Secondary | ICD-10-CM | POA: Diagnosis not present

## 2019-12-16 DIAGNOSIS — Z7409 Other reduced mobility: Secondary | ICD-10-CM | POA: Diagnosis not present

## 2019-12-16 DIAGNOSIS — R29898 Other symptoms and signs involving the musculoskeletal system: Secondary | ICD-10-CM | POA: Diagnosis not present

## 2019-12-16 DIAGNOSIS — M25462 Effusion, left knee: Secondary | ICD-10-CM | POA: Diagnosis not present

## 2019-12-16 DIAGNOSIS — M25662 Stiffness of left knee, not elsewhere classified: Secondary | ICD-10-CM | POA: Diagnosis not present

## 2019-12-17 ENCOUNTER — Other Ambulatory Visit: Payer: Self-pay | Admitting: Pulmonary Disease

## 2019-12-17 DIAGNOSIS — E669 Obesity, unspecified: Secondary | ICD-10-CM | POA: Diagnosis not present

## 2019-12-23 DIAGNOSIS — M25662 Stiffness of left knee, not elsewhere classified: Secondary | ICD-10-CM | POA: Diagnosis not present

## 2019-12-23 DIAGNOSIS — Z96652 Presence of left artificial knee joint: Secondary | ICD-10-CM | POA: Diagnosis not present

## 2019-12-23 DIAGNOSIS — R29898 Other symptoms and signs involving the musculoskeletal system: Secondary | ICD-10-CM | POA: Diagnosis not present

## 2019-12-23 DIAGNOSIS — Z7409 Other reduced mobility: Secondary | ICD-10-CM | POA: Diagnosis not present

## 2019-12-29 DIAGNOSIS — R52 Pain, unspecified: Secondary | ICD-10-CM | POA: Diagnosis not present

## 2019-12-29 DIAGNOSIS — F4323 Adjustment disorder with mixed anxiety and depressed mood: Secondary | ICD-10-CM | POA: Diagnosis not present

## 2019-12-29 DIAGNOSIS — M180 Bilateral primary osteoarthritis of first carpometacarpal joints: Secondary | ICD-10-CM | POA: Diagnosis not present

## 2019-12-30 DIAGNOSIS — Z96652 Presence of left artificial knee joint: Secondary | ICD-10-CM | POA: Diagnosis not present

## 2019-12-30 DIAGNOSIS — R29898 Other symptoms and signs involving the musculoskeletal system: Secondary | ICD-10-CM | POA: Diagnosis not present

## 2019-12-30 DIAGNOSIS — M25662 Stiffness of left knee, not elsewhere classified: Secondary | ICD-10-CM | POA: Diagnosis not present

## 2019-12-30 DIAGNOSIS — Z7409 Other reduced mobility: Secondary | ICD-10-CM | POA: Diagnosis not present

## 2019-12-31 ENCOUNTER — Other Ambulatory Visit: Payer: Self-pay | Admitting: Cardiovascular Disease

## 2020-01-13 ENCOUNTER — Encounter: Payer: Self-pay | Admitting: Hematology and Oncology

## 2020-01-13 DIAGNOSIS — Z1231 Encounter for screening mammogram for malignant neoplasm of breast: Secondary | ICD-10-CM | POA: Diagnosis not present

## 2020-01-16 ENCOUNTER — Ambulatory Visit (INDEPENDENT_AMBULATORY_CARE_PROVIDER_SITE_OTHER): Payer: Medicare Other | Admitting: Pulmonary Disease

## 2020-01-16 ENCOUNTER — Other Ambulatory Visit: Payer: Self-pay

## 2020-01-16 ENCOUNTER — Encounter: Payer: Self-pay | Admitting: Pulmonary Disease

## 2020-01-16 VITALS — BP 160/80 | HR 72 | Temp 97.7°F | Ht 68.5 in | Wt 208.8 lb

## 2020-01-16 DIAGNOSIS — R634 Abnormal weight loss: Secondary | ICD-10-CM

## 2020-01-16 DIAGNOSIS — J432 Centrilobular emphysema: Secondary | ICD-10-CM | POA: Diagnosis not present

## 2020-01-16 DIAGNOSIS — G4733 Obstructive sleep apnea (adult) (pediatric): Secondary | ICD-10-CM

## 2020-01-16 DIAGNOSIS — J449 Chronic obstructive pulmonary disease, unspecified: Secondary | ICD-10-CM

## 2020-01-16 MED ORDER — ALBUTEROL SULFATE HFA 108 (90 BASE) MCG/ACT IN AERS: 2.0000 | INHALATION_SPRAY | Freq: Four times a day (QID) | RESPIRATORY_TRACT | 5 refills | Status: DC | PRN

## 2020-01-16 NOTE — Progress Notes (Signed)
[facility]  Chief Complaint  Patient presents with  . Follow-up    OSA treated with BIPAP    Constitutional:  BP (!) 160/80 (BP Location: Right Arm, Patient Position: Sitting, Cuff Size: Large)   Pulse 72   Temp 97.7 F (36.5 C)   Ht 5' 8.5" (1.74 m)   Wt 208 lb 12.8 oz (94.7 kg)   SpO2 98% Comment: on room air  BMI 31.29 kg/m   Past Medical History:  CAD, HTN, HLD, DM, Cataracts, Breast cancer February 2016, OA  Brief Summary:  Mariah Kelley is a 69 y.o. female former smoker COPD with emphysema and sleep apnea.  Lost an additional 12 lbs since I saw her last in July 2020.  Had left knee surgery with Dr. Ronnie Derby.  Finally recovered enough so that she can start walking again.  Walking 3 miles per day with plan to increase to 5 miles per day.  She doesn't feel like arnuity is helping much.  Hasn't needed albuterol.  Not having cough, wheeze, sputum.  She isn't sure Bipap is helping anymore.  She can sleep w/o it and feels fine.  Started on celebrex recently for hand pain.  Has noticed BP elevated more since starting this.  Physical Exam:   Appearance - well kempt   ENMT - no sinus tenderness, no nasal discharge, no oral exudate  Neck - no masses, trachea midline, no thyromegaly, no elevation in JVP  Respiratory - normal appearance of chest wall, normal respiratory effort w/o accessory muscle use, no dullness on percussion, no wheezing or rales  CV - s1s2 regular rate and rhythm, no murmurs, no peripheral edema, radial pulses symmetric  GI - soft, non tender  Lymph - no adenopathy noted in neck and axillary areas  MSK - normal gait  Ext - no cyanosis, clubbing, or joint inflammation noted  Skin - no rashes, lesions, or ulcers  Neuro - normal strength, oriented x 3  Psych - normal mood and affect   Assessment/Plan:   COPD with emphysema and asthma. - much improved respiratory symptoms since she has lost  weight - will plan to d/c arnuity and only use prn albuterol  Obstructive sleep apnea. - much improved since weight loss - will arrange for home sleep study to determine if she needs PAP therapy still - continue auto Bipap otherwise for now  History of tobacco abuse. - not eligible for lung cancer screening until she is 5 yrs out from breast cancer therapy >>  She will call in August 2021 to update status and then determine if she is eligible to resume screening  Obesity. - she had sleeve gastrectomy at [facility] in February 2020   Patient Instructions  Will arrange for home sleep study and call with results  Call when you are ready to set up low dose CT chest screening program  Follow up in 1 year   Time spent 28 minutes  Chesley Mires, MD [facility] Pager: [phone] 01/16/2020, 9:34 AM  Flow Sheet     Pulmonary tests:  Methacholine challenge 11/24/16 >> FEV1 1.91 (66%), FEV1% 79, + methacholine challenge CT chest lung cancer screen 12/13/16 >> emphysema, atherosclerosis PFT 10/04/17 >> FEV1 1.94 (69%), FEV1% 82, TLC 4.66 (81%), DLCO 80%  Sleep tests:  PSG 05/01/17 >> AHI 19, SpO2 low 62% Bipap titration 06/28/17 >> failed CPAP; Bipap 12/8 cm H2O PSG 08/01/17 > AHI 26.8; auto Bipap ONO with Bipap 10/03/17 >>test time 7  hrs 1 min. Average SpO2 90%, low SpO2 83%. Spent 58 min with SpO2 <88%. Auto Bipap 12/16/19 to 01/14/20 >> used on 29 of 30 nights with average 6 hrs 23 min.  Average AHI 0.8 with median Bipap 9/5 and 95 th percentile 11/7 cm H2O.  Medications:   Allergies as of 01/16/2020      Reactions   Prednisone Other (See Comments)   "made poison ivy worse", rebound effect when take prednisone   Codeine Nausea Only, Other (See Comments)   HEADACHE      Medication List       Accurate as of January 16, 2020  9:34 AM. If you have any questions, ask your nurse or doctor.        STOP taking these medications   oxyCODONE 5 MG immediate  release tablet Commonly known as: Oxy IR/ROXICODONE Stopped by: Chesley Mires, MD     TAKE these medications   AeroChamber MV inhaler Use as instructed   albuterol 108 (90 Base) MCG/ACT inhaler Commonly known as: VENTOLIN HFA Inhale 2 puffs into the lungs every 6 (six) hours as needed for wheezing or shortness of breath.   anastrozole 1 MG tablet Commonly known as: ARIMIDEX Take 1 tablet (1 mg total) by mouth daily.   Arnuity Ellipta 100 MCG/ACT Aepb Generic drug: Fluticasone Furoate INHALE 1 PUFF BY MOUTH EVERY DAY   aspirin 325 MG EC tablet Take 1 tablet (325 mg total) by mouth 2 (two) times daily. Start 12/8   atorvastatin 80 MG tablet Commonly known as: LIPITOR TAKE 1 TABLET (80 MG TOTAL) BY MOUTH DAILY AT 6 PM. What changed: See the new instructions.   calcium citrate-vitamin D 500-500 MG-UNIT chewable tablet Chew 1 tablet by mouth 3 (three) times daily.   celecoxib 200 MG capsule Commonly known as: CELEBREX Take 200 mg by mouth daily.   Iron 142 (45 Fe) MG Tbcr Take 142 mg by mouth daily.   methocarbamol 500 MG tablet Commonly known as: ROBAXIN Take 1-2 tablets (500-1,000 mg total) by mouth 4 (four) times daily.   metoprolol tartrate 50 MG tablet Commonly known as: LOPRESSOR TAKE 1 TABLET BY MOUTH TWICE A DAY   multivitamin with minerals Tabs tablet Take 1 tablet by mouth 2 (two) times daily.   nitroGLYCERIN 0.4 MG SL tablet Commonly known as: NITROSTAT PLACE 1 TABLET UNDER THE TONGUE EVERY 5 MINUTES AS NEEDED FOR CHEST PAIN. What changed: See the new instructions.       Past Surgical History:  She  has a past surgical history that includes Abdominal hysterectomy; Spinal fusion; Appendectomy; Percutaneous coronary stent intervention (pci-s); left heart cath (Bilateral, 01/17/2014); Cholecystectomy; Tubal ligation; Colonoscopy; Mastectomy w/ sentinel node biopsy (Right, 01/27/2015); Breast reduction surgery (Left, 01/27/2015); Mastectomy w/ sentinel node  biopsy (Right, 01/27/2015); Breast reduction surgery (Left, 01/27/2015); Portacath placement (Left, 03/23/2015); Cataract extraction w/PHACO (Right, 04/11/2016); Cataract extraction w/PHACO (Left, 05/02/2016); Total shoulder arthroplasty (Left, 04/19/2017); sleeve gastrectomy; Eye surgery; and Total knee arthroplasty (Left, 10/13/2019).  Family History:  Her family history includes CAD in her brother and brother; Heart attack (age of onset: 53) in her mother; Heart attack (age of onset: 81) in her father.  Social History:  She  reports that she quit smoking about 10 years ago. Her smoking use included cigarettes. She has a 40.00 pack-year smoking history. She has quit using smokeless tobacco. She reports that she does not drink alcohol or use drugs.

## 2020-01-16 NOTE — Patient Instructions (Signed)
Will arrange for home sleep study and call with results  Call when you are ready to set up low dose CT chest screening program  Follow up in 1 year

## 2020-01-18 ENCOUNTER — Other Ambulatory Visit: Payer: Self-pay | Admitting: Cardiovascular Disease

## 2020-01-18 ENCOUNTER — Other Ambulatory Visit: Payer: Self-pay | Admitting: Pulmonary Disease

## 2020-01-22 ENCOUNTER — Encounter: Payer: Self-pay | Admitting: Hematology and Oncology

## 2020-01-22 DIAGNOSIS — F4323 Adjustment disorder with mixed anxiety and depressed mood: Secondary | ICD-10-CM | POA: Diagnosis not present

## 2020-01-22 DIAGNOSIS — R921 Mammographic calcification found on diagnostic imaging of breast: Secondary | ICD-10-CM | POA: Diagnosis not present

## 2020-02-02 ENCOUNTER — Other Ambulatory Visit: Payer: Self-pay | Admitting: Radiology

## 2020-02-02 ENCOUNTER — Encounter: Payer: Self-pay | Admitting: Hematology and Oncology

## 2020-02-02 DIAGNOSIS — N6001 Solitary cyst of right breast: Secondary | ICD-10-CM | POA: Diagnosis not present

## 2020-02-02 DIAGNOSIS — N6321 Unspecified lump in the left breast, upper outer quadrant: Secondary | ICD-10-CM | POA: Diagnosis not present

## 2020-02-03 ENCOUNTER — Ambulatory Visit: Payer: Medicare Other

## 2020-02-03 ENCOUNTER — Other Ambulatory Visit: Payer: Self-pay

## 2020-02-03 DIAGNOSIS — R634 Abnormal weight loss: Secondary | ICD-10-CM

## 2020-02-03 DIAGNOSIS — G4733 Obstructive sleep apnea (adult) (pediatric): Secondary | ICD-10-CM

## 2020-02-09 DIAGNOSIS — F4323 Adjustment disorder with mixed anxiety and depressed mood: Secondary | ICD-10-CM | POA: Diagnosis not present

## 2020-02-09 DIAGNOSIS — M180 Bilateral primary osteoarthritis of first carpometacarpal joints: Secondary | ICD-10-CM | POA: Diagnosis not present

## 2020-02-10 ENCOUNTER — Telehealth: Payer: Self-pay | Admitting: Hematology and Oncology

## 2020-02-10 ENCOUNTER — Telehealth: Payer: Self-pay | Admitting: Pulmonary Disease

## 2020-02-10 DIAGNOSIS — G4733 Obstructive sleep apnea (adult) (pediatric): Secondary | ICD-10-CM | POA: Diagnosis not present

## 2020-02-10 NOTE — Telephone Encounter (Signed)
Scheduled appt per 4/6 sch message - pt is aware of appt date and time

## 2020-02-10 NOTE — Telephone Encounter (Signed)
HST 02/03/20 >> AHI 10.7, SpO2 low 76%   Please let her know sleep study shows that sleep apnea severity has improved with weight loss, but she still has mild sleep apnea.  She should continue wearing auto Bipap for now.

## 2020-02-11 NOTE — Telephone Encounter (Signed)
Pt made aware of results of sleep study with improvement but need to continue bipap therapy. Nothing further needed.

## 2020-02-12 ENCOUNTER — Other Ambulatory Visit: Payer: Self-pay | Admitting: Cardiovascular Disease

## 2020-03-01 DIAGNOSIS — F4323 Adjustment disorder with mixed anxiety and depressed mood: Secondary | ICD-10-CM | POA: Diagnosis not present

## 2020-03-10 DIAGNOSIS — M180 Bilateral primary osteoarthritis of first carpometacarpal joints: Secondary | ICD-10-CM | POA: Diagnosis not present

## 2020-03-10 NOTE — Progress Notes (Signed)
Patient Care Team: Jonathon Jordan, MD as PCP - General (Family Medicine) Lorretta Harp, MD as PCP - Cardiology (Cardiology) Alphonsa Overall, MD as Consulting Physician (General Surgery) Nicholas Lose, MD as Consulting Physician (Hematology and Oncology) Thea Silversmith, MD as Consulting Physician (Radiation Oncology) Mauro Kaufmann, RN as Registered Nurse Rockwell Germany, RN as Registered Nurse Holley Bouche, NP (Inactive) as Nurse Practitioner (Nurse Practitioner) Irene Limbo, MD as Consulting Physician (Plastic Surgery) Sylvan Cheese, NP as Nurse Practitioner (Nurse Practitioner)  DIAGNOSIS:    ICD-10-CM   1. Malignant neoplasm of upper-outer quadrant of right breast in female, estrogen receptor positive ([place])  C50.411 anastrozole (ARIMIDEX) 1 MG tablet   Z17.0     SUMMARY OF ONCOLOGIC HISTORY: Oncology History  Malignant neoplasm of upper-outer quadrant of right breast in female, estrogen receptor positive ([place])  04/29/2014 Procedure   Prior right breast bx: fibrocystic changes with calcifications, no evidence of malignancy.   12/11/2014 Mammogram   Right breast: focal asymmetric density with indistinct margin at 11:00 middle depth. Also area of grouped heterogenous calcifications at 12:00, middle depth, increased in number.   12/11/2014 Breast US   Right breast: 2 cm round mass with irregular margin in UOQ, posterior depth; hypoechoic with posterior acoustic enhancement; lobulated LN with focal cortex thickening in right axilla   12/17/2014 Initial Biopsy   Right breast biopsy: Invasive ductal carcinoma with DCIS, right axillary lymph node benign   12/24/2014 Procedure   Right breast needle core bx: at least atypical ductal hyperplasia with associated calcifications - suspicious for DCIS   12/29/2014 Breast MRI   2.1 cm mass in the upper-outer quadrant of the right breast 2 possible satellite nodules associated with this mass. NME 2.2cm, together 5.1  cm   12/29/2014 Clinical Stage   Stage IIA: T2 N0   01/27/2015 Definitive Surgery   Right breast mastectomy/SLNB Mariah Kelley): invasive ductal carcinoma, grade 2, with DCIS (int. grade), spanning 2.2 cm, ER+ (100%), PR- (0%,) HER-2 negative (ratio 1.53), Ki-67 21%. 1 LN negative for malignancy (0/1)   01/27/2015 Pathologic Stage   Stage IIA: pT2 pN0   01/27/2015 Oncotype testing   Score: 25 (16% ROR)   03/03/2015 - 05/04/2015 Chemotherapy   Adjuvant chemotherapy with docetaxel and cyclophosphamide 4 (Mariah Kelley).   06/04/2015 -  Anti-estrogen oral therapy   Anastrozole 1 mg daily. Planned duration of therapy 7 years Mariah Kelley)   08/25/2015 Survivorship   Survivorship care plan completed and mailed to patient in lieu of in person visit     CHIEF COMPLIANT: Follow-up of right breast cancer on anastrozole  INTERVAL HISTORY: Mariah Kelley is a 69 y.o. with above-mentioned history of right breast cancer who is currently on anastrozole therapy. She presents to the clinic today for follow-up.  She reports no problems tolerating anastrozole therapy.  Denies any hot flashes or myalgias.  Denies any lumps or nodules in the breast.  Her recent mammograms were repeated at [facility].  Because they were grouped calcifications in the left breast which were indeterminate.  Stereotactic biopsy was performed for this 0.8 cm group of calcifications.  ALLERGIES:  is allergic to prednisone and codeine.  MEDICATIONS:  Current Outpatient Medications  Medication Sig Dispense Refill  . albuterol (VENTOLIN HFA) 108 (90 Base) MCG/ACT inhaler Inhale 2 puffs into the lungs every 6 (six) hours as needed for wheezing or shortness of breath. 6.7 g 5  . anastrozole (ARIMIDEX) 1 MG tablet Take 1 tablet (1 mg total) by mouth daily.  90 tablet 3  . ARNUITY ELLIPTA 100 MCG/ACT AEPB INHALE 1 PUFF BY MOUTH EVERY DAY 30 each 5  . atorvastatin (LIPITOR) 80 MG tablet TAKE 1 TABLET (80 MG TOTAL) BY MOUTH DAILY AT 6 PM. (Patient taking  differently: Take 80 mg by mouth daily at 6 PM. ) 90 tablet 3  . calcium citrate-vitamin D 500-500 MG-UNIT chewable tablet Chew 1 tablet by mouth 3 (three) times daily.    . Ferrous Sulfate (IRON) 142 (45 Fe) MG TBCR Take 142 mg by mouth daily.    . Multiple Vitamin (MULTIVITAMIN WITH MINERALS) TABS tablet Take 1 tablet by mouth 2 (two) times daily.    . nitroGLYCERIN (NITROSTAT) 0.4 MG SL tablet PLACE 1 TABLET UNDER THE TONGUE EVERY 5 MINUTES AS NEEDED FOR CHEST PAIN. 25 tablet 4   No current facility-administered medications for this visit.    PHYSICAL EXAMINATION: ECOG PERFORMANCE STATUS: 1 - Symptomatic but completely ambulatory  Vitals:   03/11/20 1402  BP: (!) 161/68  Pulse: 65  Resp: 18  Temp: 98.2 F (36.8 C)  SpO2: 98%   Filed Weights   03/11/20 1402  Weight: 217 lb 3.2 oz (98.5 kg)    BREAST: No palpable masses or nodules in the left breast.  Right chest wall mastectomy scar is palpated there is a small nodularity in the medial aspect of the scar which was evaluated by ultrasound and felt to be benign.  (exam performed in the presence of a chaperone)  LABORATORY DATA:  I have reviewed the data as listed CMP Latest Ref Rng & Units 10/10/2019 10/16/2018 07/04/2018  Glucose 70 - 99 mg/dL 96 - -  BUN 8 - 23 mg/dL 22 - -  Creatinine 0.44 - 1.00 mg/dL 0.75 - -  Sodium 135 - 145 mmol/L 143 - -  Potassium 3.5 - 5.1 mmol/L 4.5 - -  Chloride 98 - 111 mmol/L 108 - -  CO2 22 - 32 mmol/L 27 - -  Calcium 8.9 - 10.3 mg/dL 9.9 - -  Total Protein 6.5 - 8.1 g/dL 6.9 6.6 6.5  Total Bilirubin 0.3 - 1.2 mg/dL 0.7 0.7 0.4  Alkaline Phos 38 - 126 U/L 80 96 101  AST 15 - 41 U/L _0 ALT 0 - 44 U/L _1 Lab Results  Component Value Date   WBC 6.2 10/10/2019   HGB 12.2 10/10/2019   HCT 38.9 10/10/2019   MCV 92.2 10/10/2019   PLT 260 10/10/2019   NEUTROABS 3.4 10/10/2019    ASSESSMENT & PLAN:  Malignant neoplasm of upper-outer quadrant of right breast in female,  estrogen receptor positive ([place]) Right breast mastectomy 01/27/2015: 2.2 cm grade 2 invasive ductal carcinoma ER 100%, PR 0%, HER-2 negative, Ki-67 21%, margins negative, sentinel lymph nodes negative Oncotype score 25; 16 % risk of recurrence with tamoxifen alone; status post adjuvant chemotherapy with Taxotere and Cytoxan started 03/03/2015 completed 05/04/2015, started anastrozole 06/07/2015  Anastrozole toxicities: No major side effects anastrozole.    Recommended continuation of antiestrogen therapy for 7 years.  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 Breast exam5/6/21: Benign 2. Mammograms3/07/2020: Grouped calcifications left breast indeterminate.  Additional diagnostic mammogram 01/22/2020: 0.8 cm heterogeneous calcifications stereotactic biopsy was performed  Bone density September 2019: T score -1.6:  Osteopenia, her T score on the spine is +2.2.  She is increased her weightbearing exercises and taking extra calcium.  Return to clinic in1 yearfor follow-up  No orders of the defined types were placed in this encounter.  The patient has a good understanding of the overall plan. she agrees with it. she will call with any problems that may develop before the next visit here.  Total time spent: 20 mins including face to face time and time spent for planning, charting and coordination of care  Nicholas Lose, MD 03/11/2020  I, Cloyde Reams Dorshimer, am acting as scribe for Dr. Nicholas Lose.  I have reviewed the above documentation for accuracy and completeness, and I agree with the above.

## 2020-03-11 ENCOUNTER — Other Ambulatory Visit: Payer: Self-pay

## 2020-03-11 ENCOUNTER — Inpatient Hospital Stay: Payer: Medicare Other | Attending: Hematology and Oncology | Admitting: Hematology and Oncology

## 2020-03-11 DIAGNOSIS — C50411 Malignant neoplasm of upper-outer quadrant of right female breast: Secondary | ICD-10-CM | POA: Insufficient documentation

## 2020-03-11 DIAGNOSIS — Z9011 Acquired absence of right breast and nipple: Secondary | ICD-10-CM | POA: Diagnosis not present

## 2020-03-11 DIAGNOSIS — M8580 Other specified disorders of bone density and structure, unspecified site: Secondary | ICD-10-CM | POA: Diagnosis not present

## 2020-03-11 DIAGNOSIS — Z170 Estrogen receptor positive status [ER+]: Secondary | ICD-10-CM | POA: Diagnosis not present

## 2020-03-11 MED ORDER — ANASTROZOLE 1 MG PO TABS: 1.0000 mg | ORAL_TABLET | Freq: Every day | ORAL | 3 refills | Status: DC

## 2020-03-11 NOTE — Assessment & Plan Note (Signed)
Right breast mastectomy 01/27/2015: 2.2 cm grade 2 invasive ductal carcinoma ER 100%, PR 0%, HER-2 negative, Ki-67 21%, margins negative, sentinel lymph nodes negative Oncotype score 25; 16 % risk of recurrence with tamoxifen alone; status post adjuvant chemotherapy with Taxotere and Cytoxan started 03/03/2015 completed 05/04/2015, started anastrozole 06/07/2015  Anastrozole toxicities: No major side effects anastrozole. 1. Minimal hot flashes. 2. Denies any myalgias.  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 Breast exam5/6/21: Benign 2. Mammograms February 2020 at [facility]: Benign  Bone density September 2019: T score -1.6: Osteopenia, her T score on the spine is +2.2.  She is increased her weightbearing exercises and taking extra calcium.  Return to clinic in1 yearfor follow-up 

## 2020-03-11 NOTE — Progress Notes (Signed)
Dermatology referral to Dr. Allyn Kenner in [place] successfully faxed to [phone]

## 2020-03-12 ENCOUNTER — Telehealth: Payer: Self-pay | Admitting: Hematology and Oncology

## 2020-03-12 NOTE — Telephone Encounter (Signed)
Scheduled per 05/05 los, patient has been called and notified.  

## 2020-03-24 DIAGNOSIS — F4323 Adjustment disorder with mixed anxiety and depressed mood: Secondary | ICD-10-CM | POA: Diagnosis not present

## 2020-04-02 ENCOUNTER — Other Ambulatory Visit: Payer: Self-pay | Admitting: Cardiovascular Disease

## 2020-04-05 ENCOUNTER — Other Ambulatory Visit: Payer: Self-pay | Admitting: Cardiovascular Disease

## 2020-04-12 DIAGNOSIS — F4323 Adjustment disorder with mixed anxiety and depressed mood: Secondary | ICD-10-CM | POA: Diagnosis not present

## 2020-05-01 ENCOUNTER — Other Ambulatory Visit: Payer: Self-pay | Admitting: Cardiovascular Disease

## 2020-05-04 DIAGNOSIS — F4323 Adjustment disorder with mixed anxiety and depressed mood: Secondary | ICD-10-CM | POA: Diagnosis not present

## 2020-05-24 DIAGNOSIS — F4323 Adjustment disorder with mixed anxiety and depressed mood: Secondary | ICD-10-CM | POA: Diagnosis not present

## 2020-05-25 ENCOUNTER — Telehealth: Payer: Self-pay | Admitting: Cardiovascular Disease

## 2020-05-25 NOTE — Telephone Encounter (Signed)
*  STAT* If patient is at the pharmacy, call can be transferred to refill team.   1. Which medications need to be refilled? (please list name of each medication and dose if known)  metoprolol (LOPRESSOR) 50 MG tablet [[id number]]   2. Which pharmacy/location (including street and city if local pharmacy) is medication to be sent to? [facility] [place], [place] - [address]  3. Do they need a 30 day or 90 day supply? 90 day supply   Patient has an appointment scheduled for 06/10/20.

## 2020-05-29 ENCOUNTER — Other Ambulatory Visit: Payer: Self-pay | Admitting: Cardiovascular Disease

## 2020-06-02 ENCOUNTER — Other Ambulatory Visit: Payer: Self-pay | Admitting: Cardiovascular Disease

## 2020-06-10 ENCOUNTER — Encounter: Payer: Self-pay | Admitting: Cardiovascular Disease

## 2020-06-10 ENCOUNTER — Ambulatory Visit (INDEPENDENT_AMBULATORY_CARE_PROVIDER_SITE_OTHER): Payer: Medicare Other | Admitting: Cardiovascular Disease

## 2020-06-10 ENCOUNTER — Other Ambulatory Visit: Payer: Self-pay

## 2020-06-10 VITALS — BP 154/76 | HR 62 | Ht 69.0 in | Wt 257.0 lb

## 2020-06-10 DIAGNOSIS — I252 Old myocardial infarction: Secondary | ICD-10-CM

## 2020-06-10 DIAGNOSIS — I10 Essential (primary) hypertension: Secondary | ICD-10-CM

## 2020-06-10 DIAGNOSIS — I2510 Atherosclerotic heart disease of native coronary artery without angina pectoris: Secondary | ICD-10-CM

## 2020-06-10 DIAGNOSIS — E785 Hyperlipidemia, unspecified: Secondary | ICD-10-CM | POA: Diagnosis not present

## 2020-06-10 DIAGNOSIS — G4733 Obstructive sleep apnea (adult) (pediatric): Secondary | ICD-10-CM

## 2020-06-10 MED ORDER — ATORVASTATIN CALCIUM 80 MG PO TABS: ORAL_TABLET | ORAL | 3 refills | Status: DC

## 2020-06-10 MED ORDER — NITROGLYCERIN 0.4 MG SL SUBL: SUBLINGUAL_TABLET | SUBLINGUAL | 3 refills | Status: DC

## 2020-06-10 MED ORDER — METOPROLOL TARTRATE 50 MG PO TABS: 50.0000 mg | ORAL_TABLET | Freq: Two times a day (BID) | ORAL | 3 refills | Status: DC

## 2020-06-10 NOTE — Assessment & Plan Note (Signed)
History of essential hypertension blood pressure measured today at 154/76 although at home she says in the right that runs in the 120/60 range.  She is on metoprolol.

## 2020-06-10 NOTE — Assessment & Plan Note (Signed)
History of obstructive sleep apnea on CPAP. 

## 2020-06-10 NOTE — Assessment & Plan Note (Signed)
History of CAD status post inferior STEMI treated with PCI and drug-eluting stenting by myself 01/17/2014.  The remainder of record anatomy was free of significant disease.  She had preserved LV function.  She has had no recurrent symptoms.

## 2020-06-10 NOTE — Patient Instructions (Signed)
Medication Instructions:  °Your physician recommends that you continue on your current medications as directed. Please refer to the Current Medication list given to you today. ° °*If you need a refill on your cardiac medications before your next appointment, please call your pharmacy* ° °Lab Work: °NONE ordered at this time of appointment  ° °If you have labs (blood work) drawn today and your tests are completely normal, you will receive your results only by: °MyChart Message (if you have MyChart) OR °A paper copy in the mail °If you have any lab test that is abnormal or we need to change your treatment, we will call you to review the results. ° °Testing/Procedures: °NONE ordered at this time of appointment  ° °Follow-Up: °At [facility], you and your health needs are our priority.  As part of our continuing mission to provide you with exceptional heart care, we have created designated Provider Care Teams.  These Care Teams include your primary Cardiologist (physician) and Advanced Practice Providers (APPs -  Physician Assistants and Nurse Practitioners) who all work together to provide you with the care you need, when you need it. ° °Your next appointment:   °1 year(s) ° °The format for your next appointment:   °In Person ° °Provider:   °Jonathan Berry, MD   ° °Other Instructions ° ° °

## 2020-06-10 NOTE — Assessment & Plan Note (Signed)
history of morbid obesity status post bariatric surgery by Dr. Toney Rakes at [facility] 12/31/2018.  She ended up losing close to 100 pounds but has gained 50 pounds over the last year.  She is committed to diet and exercise

## 2020-06-10 NOTE — Progress Notes (Signed)
06/10/2020 Mariah Kelley   July 02, 1951  [id number]  Primary Physician Jonathon Jordan, MD Primary Cardiologist: [name] FACP, [place], [place], [place]  HPI:  Mariah Kelley is a 69 y.o.  w/ PMHx s/f HTN, HLD, prior tobacco abuse, obesity and family h/o premature CAD I last saw her for a virtual telemedicine video visit 04/24/2019.Marland Kelley She was admitted to [facility] today for STEMI 01/17/14. She was in her USOH until ~2AM this morning when she experienced sudden onset substernal chest pressure radiating to her neck and arms bilaterally w/ associated nausea and vomiting. The discomfort persisted through the morning. She took an ASA 81mg  ~0600 w/ minimal relief. When the discomfort intensified to a 7-8/10, she sought medical care.  She has no prior cardiac history. She reports smoking 1 PPD x 40 years, no current tobacco abuse. Mother with MI in her 40s, father with MI at 11, brothers w/ CAD. Previously on medication for high cholesterol. No current medications.  In the ED, EKG revealed NSR w/ 91mm ST elevations in II, III, aVF and V5, V6 meeting diagnostic criteria for an inferolateral STEMI. Code STEMI was called, and she was transported emergently to the cardiac cath lab. I placed a drug-eluting stent in her dominant mid RCA for a hazy 80% plaque. The remainder of her coronary anatomy was free of significant disease in her function was preserved. She was discharged in stable condition. Since that time she has seen [place] PA-C in the office in followup. She does deny chest pain or shortness of breath. She has lost 40 pounds as made lifestyle modifications.since I saw her back in the office a year ago she's remained clinicallystable specifically denying chest pain or shortness of breath.  She was diagnosed with breast cancer underwent mastectomy one year ago and chemotherapy subsequent to that. She is currently cancer free.She exercises 4 times a week but  unfortunately continues to gain weight. Shehad her left shoulder replaced approximately 1 year ago.   She underwent successful bariatric surgery on 12/31/2018 by Dr. Toney Rakes at [facility].  She dropped 308 down to 221 pounds and feels clinically improved.  Unfortunately, over the last year she has gained back 50 pounds.  She did have a left total knee replacement by Dr. Lorre Nick 12/20.  She denies chest pain or shortness of breath.     Current Meds  Medication Sig  . albuterol (VENTOLIN HFA) 108 (90 Base) MCG/ACT inhaler Inhale 2 puffs into the lungs every 6 (six) hours as needed for wheezing or shortness of breath.  . anastrozole (ARIMIDEX) 1 MG tablet Take 1 tablet (1 mg total) by mouth daily.  Marland Kelley atorvastatin (LIPITOR) 80 MG tablet TAKE 1 TABLET BY MOUTH DAILY AT 6 PM.  . calcium citrate-vitamin D 500-500 MG-UNIT chewable tablet Chew 1 tablet by mouth 3 (three) times daily.  . Ferrous Sulfate (IRON) 142 (45 Fe) MG TBCR Take 142 mg by mouth daily.  . metoprolol tartrate (LOPRESSOR) 50 MG tablet Take 1 tablet (50 mg total) by mouth 2 (two) times daily.  . Multiple Vitamin (MULTIVITAMIN WITH MINERALS) TABS tablet Take 1 tablet by mouth 2 (two) times daily.  . nitroGLYCERIN (NITROSTAT) 0.4 MG SL tablet PLACE 1 TABLET UNDER THE TONGUE EVERY 5 MINUTES AS NEEDED FOR CHEST PAIN.  . [DISCONTINUED] atorvastatin (LIPITOR) 80 MG tablet TAKE 1 TABLET BY MOUTH DAILY AT 6 PM.  . [DISCONTINUED] metoprolol tartrate (LOPRESSOR) 50 MG tablet TAKE 1 TABLET  BY MOUTH TWICE A DAY  . [DISCONTINUED] nitroGLYCERIN (NITROSTAT) 0.4 MG SL tablet PLACE 1 TABLET UNDER THE TONGUE EVERY 5 MINUTES AS NEEDED FOR CHEST PAIN.     Allergies  Allergen Reactions  . Prednisone Other (See Comments)    "made poison ivy worse", rebound effect when take prednisone  . Codeine Nausea Only and Other (See Comments)    HEADACHE    Social History   Socioeconomic History  . Marital status: Single    Spouse  name: Not on file  . Number of children: Not on file  . Years of education: Not on file  . Highest education level: Not on file  Occupational History  . Occupation: Education officer, museum  Tobacco Use  . Smoking status: Former Smoker    Packs/day: 1.00    Years: 40.00    Pack years: 40.00    Types: Cigarettes    Quit date: 07/30/2009    Years since quitting: 10.8  . Smokeless tobacco: Former Systems developer  . Tobacco comment: quit 5 yrs ago  Vaping Use  . Vaping Use: Never used  Substance and Sexual Activity  . Alcohol use: No  . Drug use: No  . Sexual activity: Not Currently    Birth control/protection: Surgical  Other Topics Concern  . Not on file  Social History Narrative   Divorced, 2 children.    Social Determinants of Health   Financial Resource Strain:   . Difficulty of Paying Living Expenses:   Food Insecurity:   . Worried About Charity fundraiser in the Last Year:   . Arboriculturist in the Last Year:   Transportation Needs:   . Film/video editor (Medical):   Marland Kelley Lack of Transportation (Non-Medical):   Physical Activity:   . Days of Exercise per Week:   . Minutes of Exercise per Session:   Stress:   . Feeling of Stress :   Social Connections:   . Frequency of Communication with Friends and Family:   . Frequency of Social Gatherings with Friends and Family:   . Attends Religious Services:   . Active Member of Clubs or Organizations:   . Attends Archivist Meetings:   Marland Kelley Marital Status:   Intimate Partner Violence:   . Fear of Current or Ex-Partner:   . Emotionally Abused:   Marland Kelley Physically Abused:   . Sexually Abused:      Review of Systems: General: negative for chills, fever, night sweats or weight changes.  Cardiovascular: negative for chest pain, dyspnea on exertion, edema, orthopnea, palpitations, paroxysmal nocturnal dyspnea or shortness of breath Dermatological: negative for rash Respiratory: negative for cough or wheezing Urologic: negative for  hematuria Abdominal: negative for nausea, vomiting, diarrhea, bright red blood per rectum, melena, or hematemesis Neurologic: negative for visual changes, syncope, or dizziness All other systems reviewed and are otherwise negative except as noted above.    Blood pressure (!) 154/76, pulse 62, height 5\' 9"  (1.753 m), weight 257 lb (116.6 kg).  General appearance: alert and no distress Neck: no adenopathy, no JVD, supple, symmetrical, trachea midline, thyroid not enlarged, symmetric, no tenderness/mass/nodules and Soft left carotid bruit Lungs: clear to auscultation bilaterally Heart: regular rate and rhythm, S1, S2 normal, no murmur, click, rub or gallop Extremities: extremities normal, atraumatic, no cyanosis or edema Pulses: 2+ and symmetric Skin: Skin color, texture, turgor normal. No rashes or lesions Neurologic: Alert and oriented X 3, normal strength and tone. Normal symmetric reflexes. Normal coordination and gait  EKG sinus rhythm at 62 without ST or T wave changes.  I personally reviewed this EKG.  ASSESSMENT AND PLAN:   History of ST elevation myocardial infarction (STEMI) History of CAD status post inferior STEMI treated with PCI and drug-eluting stenting by myself 01/17/2014.  The remainder of record anatomy was free of significant disease.  She had preserved LV function.  She has had no recurrent symptoms.  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  Essential hypertension History of essential hypertension blood pressure measured today at 154/76 although at home she says in the right that runs in the 120/60 range.  She is on metoprolol.  Morbid obesity ([place])  history of morbid obesity status post bariatric surgery by Dr. Toney Rakes at [facility] 12/31/2018.  She ended up losing close to 100 pounds but has gained 50 pounds over the last year.  She is  committed to diet and exercise   OSA treated with BiPAP History of obstructive sleep apnea on CPAP      [name] [facility], [facility] 06/10/2020 2:13 PM

## 2020-06-10 NOTE — Assessment & Plan Note (Signed)
History of distal bulimia on statin therapy with lipid profile performed 06/06/2019 revealing total cholesterol 144, LDL 73 and HDL 47.

## 2020-06-16 DIAGNOSIS — Z20822 Contact with and (suspected) exposure to covid-19: Secondary | ICD-10-CM | POA: Diagnosis not present

## 2020-06-16 DIAGNOSIS — B349 Viral infection, unspecified: Secondary | ICD-10-CM | POA: Diagnosis not present

## 2020-06-17 ENCOUNTER — Other Ambulatory Visit: Payer: Medicare Other

## 2020-06-24 DIAGNOSIS — F4323 Adjustment disorder with mixed anxiety and depressed mood: Secondary | ICD-10-CM | POA: Diagnosis not present

## 2020-07-05 DIAGNOSIS — E559 Vitamin D deficiency, unspecified: Secondary | ICD-10-CM | POA: Diagnosis not present

## 2020-07-05 DIAGNOSIS — E1121 Type 2 diabetes mellitus with diabetic nephropathy: Secondary | ICD-10-CM | POA: Diagnosis not present

## 2020-07-05 DIAGNOSIS — E7800 Pure hypercholesterolemia, unspecified: Secondary | ICD-10-CM | POA: Diagnosis not present

## 2020-07-05 DIAGNOSIS — Z79899 Other long term (current) drug therapy: Secondary | ICD-10-CM | POA: Diagnosis not present

## 2020-07-08 DIAGNOSIS — I10 Essential (primary) hypertension: Secondary | ICD-10-CM | POA: Diagnosis not present

## 2020-07-08 DIAGNOSIS — M1990 Unspecified osteoarthritis, unspecified site: Secondary | ICD-10-CM | POA: Diagnosis not present

## 2020-07-08 DIAGNOSIS — C50919 Malignant neoplasm of unspecified site of unspecified female breast: Secondary | ICD-10-CM | POA: Diagnosis not present

## 2020-07-08 DIAGNOSIS — E7800 Pure hypercholesterolemia, unspecified: Secondary | ICD-10-CM | POA: Diagnosis not present

## 2020-07-08 DIAGNOSIS — Z79899 Other long term (current) drug therapy: Secondary | ICD-10-CM | POA: Diagnosis not present

## 2020-07-08 DIAGNOSIS — J449 Chronic obstructive pulmonary disease, unspecified: Secondary | ICD-10-CM | POA: Diagnosis not present

## 2020-07-08 DIAGNOSIS — E1121 Type 2 diabetes mellitus with diabetic nephropathy: Secondary | ICD-10-CM | POA: Diagnosis not present

## 2020-07-08 DIAGNOSIS — E559 Vitamin D deficiency, unspecified: Secondary | ICD-10-CM | POA: Diagnosis not present

## 2020-07-08 DIAGNOSIS — Z23 Encounter for immunization: Secondary | ICD-10-CM | POA: Diagnosis not present

## 2020-07-08 DIAGNOSIS — Z0000 Encounter for general adult medical examination without abnormal findings: Secondary | ICD-10-CM | POA: Diagnosis not present

## 2020-07-08 DIAGNOSIS — D0510 Intraductal carcinoma in situ of unspecified breast: Secondary | ICD-10-CM | POA: Diagnosis not present

## 2020-07-08 DIAGNOSIS — G4733 Obstructive sleep apnea (adult) (pediatric): Secondary | ICD-10-CM | POA: Diagnosis not present

## 2020-07-13 DIAGNOSIS — F4323 Adjustment disorder with mixed anxiety and depressed mood: Secondary | ICD-10-CM | POA: Diagnosis not present

## 2020-07-30 DIAGNOSIS — M85851 Other specified disorders of bone density and structure, right thigh: Secondary | ICD-10-CM | POA: Diagnosis not present

## 2020-07-30 DIAGNOSIS — Z780 Asymptomatic menopausal state: Secondary | ICD-10-CM | POA: Diagnosis not present

## 2020-07-30 DIAGNOSIS — M85852 Other specified disorders of bone density and structure, left thigh: Secondary | ICD-10-CM | POA: Diagnosis not present

## 2020-08-01 DIAGNOSIS — Z23 Encounter for immunization: Secondary | ICD-10-CM | POA: Diagnosis not present

## 2020-08-05 DIAGNOSIS — F4323 Adjustment disorder with mixed anxiety and depressed mood: Secondary | ICD-10-CM | POA: Diagnosis not present

## 2020-08-25 DIAGNOSIS — F4323 Adjustment disorder with mixed anxiety and depressed mood: Secondary | ICD-10-CM | POA: Diagnosis not present

## 2020-09-14 DIAGNOSIS — F4323 Adjustment disorder with mixed anxiety and depressed mood: Secondary | ICD-10-CM | POA: Diagnosis not present

## 2020-10-05 DIAGNOSIS — F4323 Adjustment disorder with mixed anxiety and depressed mood: Secondary | ICD-10-CM | POA: Diagnosis not present

## 2020-10-08 DIAGNOSIS — M180 Bilateral primary osteoarthritis of first carpometacarpal joints: Secondary | ICD-10-CM | POA: Diagnosis not present

## 2020-10-08 DIAGNOSIS — M65331 Trigger finger, right middle finger: Secondary | ICD-10-CM | POA: Diagnosis not present

## 2020-10-12 DIAGNOSIS — Z96652 Presence of left artificial knee joint: Secondary | ICD-10-CM | POA: Diagnosis not present

## 2020-10-25 DIAGNOSIS — M180 Bilateral primary osteoarthritis of first carpometacarpal joints: Secondary | ICD-10-CM | POA: Diagnosis not present

## 2020-10-25 DIAGNOSIS — M65331 Trigger finger, right middle finger: Secondary | ICD-10-CM | POA: Diagnosis not present

## 2020-11-01 DIAGNOSIS — F4323 Adjustment disorder with mixed anxiety and depressed mood: Secondary | ICD-10-CM | POA: Diagnosis not present

## 2020-11-24 DIAGNOSIS — M180 Bilateral primary osteoarthritis of first carpometacarpal joints: Secondary | ICD-10-CM | POA: Diagnosis not present

## 2020-11-24 DIAGNOSIS — E119 Type 2 diabetes mellitus without complications: Secondary | ICD-10-CM | POA: Diagnosis not present

## 2020-11-24 DIAGNOSIS — M65332 Trigger finger, left middle finger: Secondary | ICD-10-CM | POA: Diagnosis not present

## 2020-11-24 DIAGNOSIS — Z961 Presence of intraocular lens: Secondary | ICD-10-CM | POA: Diagnosis not present

## 2020-11-24 DIAGNOSIS — M65331 Trigger finger, right middle finger: Secondary | ICD-10-CM | POA: Diagnosis not present

## 2020-12-01 DIAGNOSIS — F4323 Adjustment disorder with mixed anxiety and depressed mood: Secondary | ICD-10-CM | POA: Diagnosis not present

## 2020-12-29 DIAGNOSIS — F4323 Adjustment disorder with mixed anxiety and depressed mood: Secondary | ICD-10-CM | POA: Diagnosis not present

## 2021-01-05 DIAGNOSIS — M65331 Trigger finger, right middle finger: Secondary | ICD-10-CM | POA: Diagnosis not present

## 2021-01-05 DIAGNOSIS — M180 Bilateral primary osteoarthritis of first carpometacarpal joints: Secondary | ICD-10-CM | POA: Diagnosis not present

## 2021-01-26 DIAGNOSIS — F4323 Adjustment disorder with mixed anxiety and depressed mood: Secondary | ICD-10-CM | POA: Diagnosis not present

## 2021-02-02 ENCOUNTER — Encounter: Payer: Self-pay | Admitting: Hematology and Oncology

## 2021-02-02 DIAGNOSIS — R921 Mammographic calcification found on diagnostic imaging of breast: Secondary | ICD-10-CM | POA: Diagnosis not present

## 2021-02-23 DIAGNOSIS — F4323 Adjustment disorder with mixed anxiety and depressed mood: Secondary | ICD-10-CM | POA: Diagnosis not present

## 2021-03-09 NOTE — Progress Notes (Signed)
Patient Care Team: Jonathon Jordan, MD as PCP - General (Family Medicine) Lorretta Harp, MD as PCP - Cardiology (Cardiology) Alphonsa Overall, MD as Consulting Physician (General Surgery) Nicholas Lose, MD as Consulting Physician (Hematology and Oncology) Thea Silversmith, MD as Consulting Physician (Radiation Oncology) Mauro Kaufmann, RN as Registered Nurse Rockwell Germany, RN as Registered Nurse Holley Bouche, NP (Inactive) as Nurse Practitioner (Nurse Practitioner) Irene Limbo, MD as Consulting Physician (Plastic Surgery) Sylvan Cheese, NP as Nurse Practitioner (Nurse Practitioner)  DIAGNOSIS:    ICD-10-CM   1. Malignant neoplasm of upper-outer quadrant of right breast in female, estrogen receptor positive ([place])  C50.411    Z17.0     SUMMARY OF ONCOLOGIC HISTORY: Oncology History  Malignant neoplasm of upper-outer quadrant of right breast in female, estrogen receptor positive ([place])  04/29/2014 Procedure   Prior right breast bx: fibrocystic changes with calcifications, no evidence of malignancy.   12/11/2014 Mammogram   Right breast: focal asymmetric density with indistinct margin at 11:00 middle depth. Also area of grouped heterogenous calcifications at 12:00, middle depth, increased in number.   12/11/2014 Breast US   Right breast: 2 cm round mass with irregular margin in UOQ, posterior depth; hypoechoic with posterior acoustic enhancement; lobulated LN with focal cortex thickening in right axilla   12/17/2014 Initial Biopsy   Right breast biopsy: Invasive ductal carcinoma with DCIS, right axillary lymph node benign   12/24/2014 Procedure   Right breast needle core bx: at least atypical ductal hyperplasia with associated calcifications - suspicious for DCIS   12/29/2014 Breast MRI   2.1 cm mass in the upper-outer quadrant of the right breast 2 possible satellite nodules associated with this mass. NME 2.2cm, together 5.1 cm   12/29/2014 Clinical Stage    Stage IIA: T2 N0   01/27/2015 Definitive Surgery   Right breast mastectomy/SLNB Lucia Gaskins): invasive ductal carcinoma, grade 2, with DCIS (int. grade), spanning 2.2 cm, ER+ (100%), PR- (0%,) HER-2 negative (ratio 1.53), Ki-67 21%. 1 LN negative for malignancy (0/1)   01/27/2015 Pathologic Stage   Stage IIA: pT2 pN0   01/27/2015 Oncotype testing   Score: 25 (16% ROR)   03/03/2015 - 05/04/2015 Chemotherapy   Adjuvant chemotherapy with docetaxel and cyclophosphamide 4 (Jhordyn Hoopingarner).   06/04/2015 -  Anti-estrogen oral therapy   Anastrozole 1 mg daily. Planned duration of therapy 7 years Lindi Adie)   08/25/2015 Survivorship   Survivorship care plan completed and mailed to patient in lieu of in person visit     CHIEF COMPLIANT: Follow-up of right breast cancer on anastrozole  INTERVAL HISTORY: Mariah Kelley is a 70 y.o. with above-mentioned history of right breast cancer who is currently on anastrozole therapy. Mammogram on 02/02/21 showed no evidence of malignancy bilaterally. She presents to the clinic today for follow-up.   ALLERGIES:  is allergic to prednisone and codeine.  MEDICATIONS:  Current Outpatient Medications  Medication Sig Dispense Refill  . albuterol (VENTOLIN HFA) 108 (90 Base) MCG/ACT inhaler Inhale 2 puffs into the lungs every 6 (six) hours as needed for wheezing or shortness of breath. 6.7 g 5  . anastrozole (ARIMIDEX) 1 MG tablet Take 1 tablet (1 mg total) by mouth daily. 90 tablet 3  . atorvastatin (LIPITOR) 80 MG tablet TAKE 1 TABLET BY MOUTH DAILY AT 6 PM. 90 tablet 3  . calcium citrate-vitamin D 500-500 MG-UNIT chewable tablet Chew 1 tablet by mouth 3 (three) times daily.    . Ferrous Sulfate (IRON) 142 (45 Fe) MG  TBCR Take 142 mg by mouth daily.    . metoprolol tartrate (LOPRESSOR) 50 MG tablet Take 1 tablet (50 mg total) by mouth 2 (two) times daily. 180 tablet 3  . Multiple Vitamin (MULTIVITAMIN WITH MINERALS) TABS tablet Take 1 tablet by mouth 2 (two) times daily.     . nitroGLYCERIN (NITROSTAT) 0.4 MG SL tablet PLACE 1 TABLET UNDER THE TONGUE EVERY 5 MINUTES AS NEEDED FOR CHEST PAIN. 25 tablet 3   No current facility-administered medications for this visit.    PHYSICAL EXAMINATION: ECOG PERFORMANCE STATUS: 1 - Symptomatic but completely ambulatory  There were no vitals filed for this visit. There were no vitals filed for this visit.  BREAST: No palpable masses or nodules in either right or left breasts. No palpable axillary supraclavicular or infraclavicular adenopathy no breast tenderness or nipple discharge. (exam performed in the presence of a chaperone)  LABORATORY DATA:  I have reviewed the data as listed CMP Latest Ref Rng & Units 10/10/2019 10/16/2018 07/04/2018  Glucose 70 - 99 mg/dL 96 - -  BUN 8 - 23 mg/dL 22 - -  Creatinine 0.44 - 1.00 mg/dL 0.75 - -  Sodium 135 - 145 mmol/L 143 - -  Potassium 3.5 - 5.1 mmol/L 4.5 - -  Chloride 98 - 111 mmol/L 108 - -  CO2 22 - 32 mmol/L 27 - -  Calcium 8.9 - 10.3 mg/dL 9.9 - -  Total Protein 6.5 - 8.1 g/dL 6.9 6.6 6.5  Total Bilirubin 0.3 - 1.2 mg/dL 0.7 0.7 0.4  Alkaline Phos 38 - 126 U/L 80 96 101  AST 15 - 41 U/L 23 19 25   ALT 0 - 44 U/L 27 23 29     Lab Results  Component Value Date   WBC 6.2 10/10/2019   HGB 12.2 10/10/2019   HCT 38.9 10/10/2019   MCV 92.2 10/10/2019   PLT 260 10/10/2019   NEUTROABS 3.4 10/10/2019    ASSESSMENT & PLAN:  Malignant neoplasm of upper-outer quadrant of right breast in female, estrogen receptor positive ([place]) Right breast mastectomy 01/27/2015: 2.2 cm grade 2 invasive ductal carcinoma ER 100%, PR 0%, HER-2 negative, Ki-67 21%, margins negative, sentinel lymph nodes negative Oncotype score 25; 16 % risk of recurrence with tamoxifen alone; status post adjuvant chemotherapy with Taxotere and Cytoxan started 03/03/2015 completed 05/04/2015, started anastrozole 06/07/2015  Anastrozole toxicities: No major side effects anastrozole.    Recommended  continuation of antiestrogen therapy for 7 years.  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 Breast exam5/03/2021: Benign 2. Mammograms 02/02/2021: Benign breast density category B  Bone density September 2019: T score -1.6: Osteopenia, her T score on the spine is +2.2. She is increased her weightbearing exercises and taking extra calcium.  She had a bone density at [facility] few months ago and she is continuing to be in osteopenia  She is planning on going to [place] in [place] for her [place] vacation.  She is really excited and looking forward to it  Return to clinic in1 yearfor follow-up     No orders of the defined types were placed in this encounter.  The patient has a good understanding of the overall plan. she agrees with it. she will call with any problems that may develop before the next visit here.  Total time spent: 20 mins including face to face time and time spent for planning, charting and coordination of care  Rulon Eisenmenger, MD, MPH 03/10/2021  I, Cloyde Reams Dorshimer, am acting as scribe for Dr. Nicholas Lose.  I have reviewed the above documentation for accuracy and completeness, and I agree with the above.

## 2021-03-09 NOTE — Assessment & Plan Note (Signed)
Right breast mastectomy 01/27/2015: 2.2 cm grade 2 invasive ductal carcinoma ER 100%, PR 0%, HER-2 negative, Ki-67 21%, margins negative, sentinel lymph nodes negative Oncotype score 25; 16 % risk of recurrence with tamoxifen alone; status post adjuvant chemotherapy with Taxotere and Cytoxan started 03/03/2015 completed 05/04/2015, started anastrozole 06/07/2015  Anastrozole toxicities: No major side effects anastrozole.    Recommended continuation of antiestrogen therapy for 7 years.  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 Breast exam5/03/2021: Benign 2. Mammograms 02/02/2021: Benign breast density category B  Bone density September 2019: T score -1.6: Osteopenia, her T score on the spine is +2.2. She is increased her weightbearing exercises and taking extra calcium.  Return to clinic in1 yearfor follow-up

## 2021-03-10 ENCOUNTER — Other Ambulatory Visit: Payer: Self-pay

## 2021-03-10 ENCOUNTER — Inpatient Hospital Stay: Payer: Medicare Other | Attending: Hematology and Oncology | Admitting: Hematology and Oncology

## 2021-03-10 DIAGNOSIS — C50411 Malignant neoplasm of upper-outer quadrant of right female breast: Secondary | ICD-10-CM | POA: Insufficient documentation

## 2021-03-10 DIAGNOSIS — Z170 Estrogen receptor positive status [ER+]: Secondary | ICD-10-CM

## 2021-03-10 DIAGNOSIS — M8580 Other specified disorders of bone density and structure, unspecified site: Secondary | ICD-10-CM | POA: Diagnosis not present

## 2021-03-10 MED ORDER — ANASTROZOLE 1 MG PO TABS: 1.0000 mg | ORAL_TABLET | Freq: Every day | ORAL | 3 refills | Status: DC

## 2021-03-23 DIAGNOSIS — F4323 Adjustment disorder with mixed anxiety and depressed mood: Secondary | ICD-10-CM | POA: Diagnosis not present

## 2021-05-28 DIAGNOSIS — Z20822 Contact with and (suspected) exposure to covid-19: Secondary | ICD-10-CM | POA: Diagnosis not present

## 2021-05-28 DIAGNOSIS — J449 Chronic obstructive pulmonary disease, unspecified: Secondary | ICD-10-CM | POA: Diagnosis not present

## 2021-05-28 DIAGNOSIS — U071 COVID-19: Secondary | ICD-10-CM | POA: Diagnosis not present

## 2021-05-28 DIAGNOSIS — J029 Acute pharyngitis, unspecified: Secondary | ICD-10-CM | POA: Diagnosis not present

## 2021-06-01 DIAGNOSIS — U071 COVID-19: Secondary | ICD-10-CM | POA: Diagnosis not present

## 2021-07-03 ENCOUNTER — Other Ambulatory Visit: Payer: Self-pay | Admitting: Cardiovascular Disease

## 2021-07-04 ENCOUNTER — Other Ambulatory Visit: Payer: Self-pay | Admitting: Cardiovascular Disease

## 2021-07-15 DIAGNOSIS — E559 Vitamin D deficiency, unspecified: Secondary | ICD-10-CM | POA: Diagnosis not present

## 2021-07-15 DIAGNOSIS — E1121 Type 2 diabetes mellitus with diabetic nephropathy: Secondary | ICD-10-CM | POA: Diagnosis not present

## 2021-07-15 DIAGNOSIS — Z79899 Other long term (current) drug therapy: Secondary | ICD-10-CM | POA: Diagnosis not present

## 2021-07-15 DIAGNOSIS — E7800 Pure hypercholesterolemia, unspecified: Secondary | ICD-10-CM | POA: Diagnosis not present

## 2021-07-20 DIAGNOSIS — I10 Essential (primary) hypertension: Secondary | ICD-10-CM | POA: Diagnosis not present

## 2021-07-20 DIAGNOSIS — Z79899 Other long term (current) drug therapy: Secondary | ICD-10-CM | POA: Diagnosis not present

## 2021-07-20 DIAGNOSIS — Z0000 Encounter for general adult medical examination without abnormal findings: Secondary | ICD-10-CM | POA: Diagnosis not present

## 2021-07-20 DIAGNOSIS — J449 Chronic obstructive pulmonary disease, unspecified: Secondary | ICD-10-CM | POA: Diagnosis not present

## 2021-07-20 DIAGNOSIS — E1121 Type 2 diabetes mellitus with diabetic nephropathy: Secondary | ICD-10-CM | POA: Diagnosis not present

## 2021-07-20 DIAGNOSIS — E7800 Pure hypercholesterolemia, unspecified: Secondary | ICD-10-CM | POA: Diagnosis not present

## 2021-07-20 DIAGNOSIS — E559 Vitamin D deficiency, unspecified: Secondary | ICD-10-CM | POA: Diagnosis not present

## 2021-07-20 DIAGNOSIS — R0989 Other specified symptoms and signs involving the circulatory and respiratory systems: Secondary | ICD-10-CM | POA: Diagnosis not present

## 2021-07-20 DIAGNOSIS — Z23 Encounter for immunization: Secondary | ICD-10-CM | POA: Diagnosis not present

## 2021-07-20 DIAGNOSIS — C50919 Malignant neoplasm of unspecified site of unspecified female breast: Secondary | ICD-10-CM | POA: Diagnosis not present

## 2021-07-20 DIAGNOSIS — G4733 Obstructive sleep apnea (adult) (pediatric): Secondary | ICD-10-CM | POA: Diagnosis not present

## 2021-07-20 DIAGNOSIS — D0510 Intraductal carcinoma in situ of unspecified breast: Secondary | ICD-10-CM | POA: Diagnosis not present

## 2021-08-04 DIAGNOSIS — U071 COVID-19: Secondary | ICD-10-CM | POA: Diagnosis not present

## 2021-09-11 DIAGNOSIS — Z20828 Contact with and (suspected) exposure to other viral communicable diseases: Secondary | ICD-10-CM | POA: Diagnosis not present

## 2021-09-21 ENCOUNTER — Other Ambulatory Visit: Payer: Self-pay | Admitting: Orthopedic Surgery

## 2021-09-21 ENCOUNTER — Telehealth: Payer: Self-pay | Admitting: *Deleted

## 2021-09-21 DIAGNOSIS — M65331 Trigger finger, right middle finger: Secondary | ICD-10-CM | POA: Diagnosis not present

## 2021-09-21 NOTE — Telephone Encounter (Signed)
   [facility] Pre-operative Risk Assessment    Patient Name: Mariah Kelley  DOB: 09/14/1951 MRN: [id number]   Request for surgical clearance:  What type of surgery is being performed? Right middle finger Mariah-1 pulley release  When is this surgery scheduled? 10/13/21  What type of clearance is required (medical clearance vs. Pharmacy clearance to hold med vs. Both)? both  Are there any medications that need to be held prior to surgery and how long? ASA  Practice name and name of physician performing surgery? [facility] Dr. Fredna Dow  What is the office phone number? [phone]   7.   What is the office fax number? [phone]  8.   Anesthesia type (None, local, MAC, general) ? IV regional forearm block   Mariah Kelley Mariah Kelley 09/21/2021, 4:41 PM  _________________________________________________________________   (provider comments below)

## 2021-09-22 NOTE — Telephone Encounter (Signed)
Pt called back and has been scheduled to see Dr. Gwenlyn Found 10/11/21. I will send FYI to requesting office pt has appt with Dr. Gwenlyn Found 10/11/21

## 2021-09-22 NOTE — Telephone Encounter (Signed)
Primary Cardiologist:Jonathan Gwenlyn Found, MD  Chart reviewed as part of pre-operative protocol coverage. Because of Mariah Kelley's past medical history and time since last visit, he/she will require a follow-up visit in order to better assess preoperative cardiovascular risk.  Pre-op covering staff: - Please schedule appointment and call patient to inform them. - Please contact requesting surgeon's office via preferred method (i.e, phone, fax) to inform them of need for appointment prior to surgery.  If applicable, this message will also be routed to pharmacy pool and/or primary cardiologist for input on holding anticoagulant/antiplatelet agent as requested below so that this information is available at time of patient's appointment.   Deberah Pelton, NP  09/22/2021, 7:11 AM

## 2021-09-22 NOTE — Telephone Encounter (Signed)
Left a very detailed message for the pt to call back as she is going to need to schedule an appt for pre op clearance. I left message that our schedules are very full. Left detailed message that the only appt that I can offer before her surgery is 10/04/21 @ 2:30 pm. I s/w Dr. Kennon Holter nurse Rexanne Mano, who has given the ok to double book Dr. Gwenlyn Found 10/04/21 @ 2:30 for pre op clearance. I left message for the pt to call back ASAP.

## 2021-09-27 ENCOUNTER — Other Ambulatory Visit: Payer: Self-pay | Admitting: Cardiovascular Disease

## 2021-09-28 DIAGNOSIS — J449 Chronic obstructive pulmonary disease, unspecified: Secondary | ICD-10-CM | POA: Diagnosis not present

## 2021-09-28 DIAGNOSIS — E785 Hyperlipidemia, unspecified: Secondary | ICD-10-CM | POA: Diagnosis not present

## 2021-09-28 DIAGNOSIS — E1121 Type 2 diabetes mellitus with diabetic nephropathy: Secondary | ICD-10-CM | POA: Diagnosis not present

## 2021-09-28 DIAGNOSIS — I10 Essential (primary) hypertension: Secondary | ICD-10-CM | POA: Diagnosis not present

## 2021-09-28 DIAGNOSIS — I129 Hypertensive chronic kidney disease with stage 1 through stage 4 chronic kidney disease, or unspecified chronic kidney disease: Secondary | ICD-10-CM | POA: Diagnosis not present

## 2021-09-28 DIAGNOSIS — J45909 Unspecified asthma, uncomplicated: Secondary | ICD-10-CM | POA: Diagnosis not present

## 2021-09-28 DIAGNOSIS — N182 Chronic kidney disease, stage 2 (mild): Secondary | ICD-10-CM | POA: Diagnosis not present

## 2021-09-28 DIAGNOSIS — E7800 Pure hypercholesterolemia, unspecified: Secondary | ICD-10-CM | POA: Diagnosis not present

## 2021-09-28 DIAGNOSIS — M8580 Other specified disorders of bone density and structure, unspecified site: Secondary | ICD-10-CM | POA: Diagnosis not present

## 2021-09-28 DIAGNOSIS — M1990 Unspecified osteoarthritis, unspecified site: Secondary | ICD-10-CM | POA: Diagnosis not present

## 2021-10-01 ENCOUNTER — Other Ambulatory Visit: Payer: Self-pay | Admitting: Cardiovascular Disease

## 2021-10-05 ENCOUNTER — Encounter (HOSPITAL_BASED_OUTPATIENT_CLINIC_OR_DEPARTMENT_OTHER): Payer: Self-pay | Admitting: Orthopedic Surgery

## 2021-10-05 ENCOUNTER — Other Ambulatory Visit: Payer: Self-pay

## 2021-10-11 ENCOUNTER — Encounter: Payer: Self-pay | Admitting: Cardiovascular Disease

## 2021-10-11 ENCOUNTER — Encounter (HOSPITAL_BASED_OUTPATIENT_CLINIC_OR_DEPARTMENT_OTHER)
Admission: RE | Admit: 2021-10-11 | Discharge: 2021-10-11 | Disposition: A | Discharge status: Home / Self Care | Payer: Medicare Other | Source: Ambulatory Visit | Attending: Orthopedic Surgery | Admitting: Orthopedic Surgery

## 2021-10-11 ENCOUNTER — Other Ambulatory Visit: Payer: Self-pay

## 2021-10-11 ENCOUNTER — Ambulatory Visit (INDEPENDENT_AMBULATORY_CARE_PROVIDER_SITE_OTHER): Payer: Medicare Other | Admitting: Cardiovascular Disease

## 2021-10-11 VITALS — BP 142/92 | HR 74 | Ht 67.0 in | Wt 258.0 lb

## 2021-10-11 DIAGNOSIS — E119 Type 2 diabetes mellitus without complications: Secondary | ICD-10-CM | POA: Insufficient documentation

## 2021-10-11 DIAGNOSIS — G4733 Obstructive sleep apnea (adult) (pediatric): Secondary | ICD-10-CM | POA: Diagnosis not present

## 2021-10-11 DIAGNOSIS — I10 Essential (primary) hypertension: Secondary | ICD-10-CM

## 2021-10-11 DIAGNOSIS — Z01812 Encounter for preprocedural laboratory examination: Secondary | ICD-10-CM | POA: Diagnosis not present

## 2021-10-11 DIAGNOSIS — I252 Old myocardial infarction: Secondary | ICD-10-CM | POA: Diagnosis not present

## 2021-10-11 DIAGNOSIS — E785 Hyperlipidemia, unspecified: Secondary | ICD-10-CM | POA: Diagnosis not present

## 2021-10-11 DIAGNOSIS — I2510 Atherosclerotic heart disease of native coronary artery without angina pectoris: Secondary | ICD-10-CM

## 2021-10-11 LAB — BASIC METABOLIC PANEL
Anion gap: 9 (ref 5–15)
BUN: 12 mg/dL (ref 8–23)
CO2: 24 mmol/L (ref 22–32)
Calcium: 9.5 mg/dL (ref 8.9–10.3)
Chloride: 107 mmol/L (ref 98–111)
Creatinine, Ser: 0.89 mg/dL (ref 0.44–1.00)
GFR, Estimated: >60 mL/min (ref >60)
Glucose, Bld: 144 mg/dL — ABNORMAL HIGH (ref 70–99)
Potassium: 4.2 mmol/L (ref 3.5–5.1)
Sodium: 140 mmol/L (ref 135–145)

## 2021-10-11 NOTE — Assessment & Plan Note (Addendum)
History of dyslipidemia on high-dose atorvastatin with lipid profile performed 07/15/2021 revealing total cholesterol of 180, LDL 99 and HDL 53.  I think she would benefit from addition of a PCSK9.  Her most recent lipid profile performed by her PCP 10/21/2021 revealed total cholesterol 156, LDL of 79 and HDL of 48.

## 2021-10-11 NOTE — Progress Notes (Signed)

## 2021-10-11 NOTE — Patient Instructions (Signed)

## 2021-10-11 NOTE — Assessment & Plan Note (Signed)
History of morbid obesity status post bariatric surgery performed by Dr. Toney Rakes 01/01/2020.  Her weight dropped from 308 to 221 pounds although she is back up to 258 pounds.  She is however trying to lose additional weight.

## 2021-10-11 NOTE — Progress Notes (Addendum)
11/08/2021 Mariah Kelley   1951/08/27  [id number]  Primary Physician Jonathon Jordan, MD Primary Cardiologist: [name] Garret Reddish, [place], [place]  HPI:  Mariah Kelley is a 70 y.o.  w/ PMHx s/f HTN, HLD, prior tobacco abuse, obesity and family h/o premature CAD. I last saw her in the office 06/10/2020.  She was  admitted to [facility] today for STEMI 01/17/14. She was in her USOH until ~2AM this morning when she experienced sudden onset substernal chest pressure radiating to her neck and arms bilaterally w/ associated nausea and vomiting. The discomfort persisted through the morning. She took an ASA 81mg  ~0600 w/ minimal relief. When the discomfort intensified to a 7-8/10, she sought medical care.  She has no prior cardiac history. She reports smoking 1 PPD x 40 years, no current tobacco abuse. Mother with MI in her 4s, father with MI at 75, brothers w/ CAD. Previously on medication for high cholesterol. No current medications.  In the ED, EKG revealed NSR w/ 41mm ST elevations in II, III, aVF and V5, V6 meeting diagnostic criteria for an inferolateral STEMI. Code STEMI was called, and she was transported emergently to the cardiac cath lab. I placed a drug-eluting stent in her dominant mid RCA for a hazy 80% plaque. The remainder of her coronary anatomy was free of significant disease in her function was preserved. She was discharged in stable condition. Since that time she has seen [place]  PA-C in the office in followup. She does deny chest pain or shortness of breath. She has lost 40 pounds as made lifestyle modifications.since I saw her back in the office a year ago she's remained clinically stable specifically denying chest pain or shortness of breath.  She was diagnosed with breast cancer underwent mastectomy one year ago and chemotherapy subsequent to that. She is currently cancer free. She exercises 4 times a week but unfortunately continues to gain  weight. She had her left shoulder replaced approximately 1 year ago.    She underwent successful bariatric surgery on 12/31/2018 by Dr. Toney Rakes at [facility].  She dropped 308 down to 221 pounds and feels clinically improved.  Unfortunately, over the last year she has gained back 50 pounds.  She did have a left total knee replacement by Dr. Lorre Nick 12/20.    Since I saw her a year ago she continues to do well.  She is continuing to try to lose weight that she gained back after her bariatric surgery.  She denies chest pain or shortness of breath.  She is not at goal for secondary prevention with a recent lipid profile.  She is scheduled for trigger finger release this coming week.   Current Meds  Medication Sig   albuterol (VENTOLIN HFA) 108 (90 Base) MCG/ACT inhaler Inhale 2 puffs into the lungs every 6 (six) hours as needed for wheezing or shortness of breath.   anastrozole (ARIMIDEX) 1 MG tablet Take 1 tablet (1 mg total) by mouth daily.   atorvastatin (LIPITOR) 80 MG tablet TAKE 1 TABLET BY MOUTH DAILY AT 6 PM.   calcium citrate-vitamin D 500-500 MG-UNIT chewable tablet Chew 1 tablet by mouth 3 (three) times daily.   nitroGLYCERIN (NITROSTAT) 0.4 MG SL tablet PLACE 1 TABLET UNDER THE TONGUE EVERY 5 MINUTES AS NEEDED FOR CHEST PAIN.   Semaglutide,0.25 or 0.5MG /DOS, (OZEMPIC, 0.25 OR 0.5 MG/DOSE,) 2 MG/1.5ML SOPN Inject 5 mg into the skin.   [DISCONTINUED] metoprolol tartrate (  LOPRESSOR) 50 MG tablet Take 1 tablet (50 mg total) by mouth 2 (two) times daily. PATIENT NEEDS TO SCHEDULE AN OFFICE VISIT FOR FUTURE REFILLS.     Allergies  Allergen Reactions   Prednisone Other (See Comments)    "made poison ivy worse", rebound effect when take prednisone   Codeine Nausea Only and Other (See Comments)    HEADACHE    Social History   Socioeconomic History   Marital status: Single    Spouse name: Not on file   Number of children: Not on file   Years of education: Not on  file   Highest education level: Not on file  Occupational History   Occupation: Social Worker  Tobacco Use   Smoking status: Former    Packs/day: 1.00    Years: 40.00    Pack years: 40.00    Types: Cigarettes    Quit date: 07/30/2009    Years since quitting: 12.2   Smokeless tobacco: Former   Tobacco comments:    quit 5 yrs ago  Vaping Use   Vaping Use: Never used  Substance and Sexual Activity   Alcohol use: No   Drug use: No   Sexual activity: Not Currently    Birth control/protection: Surgical  Other Topics Concern   Not on file  Social History Narrative   Divorced, 2 children.    Social Determinants of Health   Financial Resource Strain: Not on file  Food Insecurity: Not on file  Transportation Needs: Not on file  Physical Activity: Not on file  Stress: Not on file  Social Connections: Not on file  Intimate Partner Violence: Not on file     Review of Systems: General: negative for chills, fever, night sweats or weight changes.  Cardiovascular: negative for chest pain, dyspnea on exertion, edema, orthopnea, palpitations, paroxysmal nocturnal dyspnea or shortness of breath Dermatological: negative for rash Respiratory: negative for cough or wheezing Urologic: negative for hematuria Abdominal: negative for nausea, vomiting, diarrhea, bright red blood per rectum, melena, or hematemesis Neurologic: negative for visual changes, syncope, or dizziness All other systems reviewed and are otherwise negative except as noted above.    Blood pressure (!) 142/92, pulse 74, height 5\' 7"  (1.702 m), weight 258 lb (117 kg), SpO2 98 %.  General appearance: alert and no distress Neck: no adenopathy, no carotid bruit, no JVD, supple, symmetrical, trachea midline, and thyroid not enlarged, symmetric, no tenderness/mass/nodules Lungs: clear to auscultation bilaterally Heart: regular rate and rhythm, S1, S2 normal, no murmur, click, rub or gallop Extremities: extremities normal,  atraumatic, no cyanosis or edema  EKG sinus rhythm at 74 with LVH voltage and poor R wave progression.  I personally reviewed this EKG.  ASSESSMENT AND PLAN:   History of ST elevation myocardial infarction (STEMI) History of inferior STEMI she with PCI drug-eluting stent by myself 01/17/2014.  The remainder of her coronary anatomy was free of significant disease and her LV function was preserved.  She has been asymptomatic since.  Dyslipidemia History of dyslipidemia on high-dose atorvastatin with lipid profile performed 07/15/2021 revealing total cholesterol of 180, LDL 99 and HDL 53.  I think she would benefit from addition of a PCSK9.  Her most recent lipid profile performed by her PCP 10/21/2021 revealed total cholesterol 156, LDL of 79 and HDL of 48.  Essential hypertension History of essential hypertension a blood pressure measured today in the office at 142/92.  She is on metoprolol.  Her blood pressures at home are in the 120/70  range.  OSA treated with BiPAP History of obstructive sleep apnea on BiPAP  Morbid obesity (HCC) History of morbid obesity status post bariatric surgery performed by Dr. Toney Rakes 01/01/2020.  Her weight dropped from 308 to 221 pounds although she is back up to 258 pounds.  She is however trying to lose additional weight.     [name] FACP,FACC,FAHA, [facility] 11/08/2021 4:11 PM

## 2021-10-11 NOTE — Assessment & Plan Note (Signed)
History of obstructive sleep apnea on BiPAP 

## 2021-10-11 NOTE — Assessment & Plan Note (Signed)
History of essential hypertension a blood pressure measured today in the office at 142/92.  She is on metoprolol.  Her blood pressures at home are in the 120/70 range.

## 2021-10-11 NOTE — Assessment & Plan Note (Signed)
History of inferior STEMI she with PCI drug-eluting stent by myself 01/17/2014.  The remainder of her coronary anatomy was free of significant disease and her LV function was preserved.  She has been asymptomatic since.

## 2021-10-13 ENCOUNTER — Encounter (HOSPITAL_BASED_OUTPATIENT_CLINIC_OR_DEPARTMENT_OTHER)
Admission: RE | Disposition: A | Discharge status: Home / Self Care | Payer: Self-pay | Source: Home / Self Care | Attending: Orthopedic Surgery

## 2021-10-13 ENCOUNTER — Ambulatory Visit (HOSPITAL_BASED_OUTPATIENT_CLINIC_OR_DEPARTMENT_OTHER): Payer: Medicare Other | Admitting: Anesthesiology

## 2021-10-13 ENCOUNTER — Ambulatory Visit (HOSPITAL_BASED_OUTPATIENT_CLINIC_OR_DEPARTMENT_OTHER)
Admission: RE | Admit: 2021-10-13 | Discharge: 2021-10-13 | Disposition: A | Discharge status: Home / Self Care | Payer: Medicare Other | Attending: Orthopedic Surgery | Admitting: Orthopedic Surgery

## 2021-10-13 ENCOUNTER — Other Ambulatory Visit: Payer: Self-pay

## 2021-10-13 ENCOUNTER — Encounter (HOSPITAL_BASED_OUTPATIENT_CLINIC_OR_DEPARTMENT_OTHER): Payer: Self-pay | Admitting: Orthopedic Surgery

## 2021-10-13 DIAGNOSIS — M65332 Trigger finger, left middle finger: Secondary | ICD-10-CM | POA: Insufficient documentation

## 2021-10-13 DIAGNOSIS — I2510 Atherosclerotic heart disease of native coronary artery without angina pectoris: Secondary | ICD-10-CM | POA: Insufficient documentation

## 2021-10-13 DIAGNOSIS — I10 Essential (primary) hypertension: Secondary | ICD-10-CM | POA: Insufficient documentation

## 2021-10-13 DIAGNOSIS — Z6841 Body Mass Index (BMI) 40.0 and over, adult: Secondary | ICD-10-CM | POA: Insufficient documentation

## 2021-10-13 DIAGNOSIS — Z87891 Personal history of nicotine dependence: Secondary | ICD-10-CM | POA: Diagnosis not present

## 2021-10-13 DIAGNOSIS — E119 Type 2 diabetes mellitus without complications: Secondary | ICD-10-CM | POA: Diagnosis not present

## 2021-10-13 DIAGNOSIS — E785 Hyperlipidemia, unspecified: Secondary | ICD-10-CM | POA: Diagnosis not present

## 2021-10-13 DIAGNOSIS — G4733 Obstructive sleep apnea (adult) (pediatric): Secondary | ICD-10-CM | POA: Diagnosis not present

## 2021-10-13 DIAGNOSIS — M65331 Trigger finger, right middle finger: Secondary | ICD-10-CM | POA: Diagnosis not present

## 2021-10-13 DIAGNOSIS — I252 Old myocardial infarction: Secondary | ICD-10-CM | POA: Insufficient documentation

## 2021-10-13 DIAGNOSIS — J439 Emphysema, unspecified: Secondary | ICD-10-CM | POA: Diagnosis not present

## 2021-10-13 HISTORY — PX: TRIGGER FINGER RELEASE: SHX641

## 2021-10-13 LAB — GLUCOSE, CAPILLARY
Glucose-Capillary: 110 mg/dL — ABNORMAL HIGH (ref 70–99)
Glucose-Capillary: 88 mg/dL (ref 70–99)

## 2021-10-13 SURGERY — RELEASE, A1 PULLEY, FOR TRIGGER FINGER
Anesthesia: Monitor Anesthesia Care | Site: Finger | Laterality: Right

## 2021-10-13 MED ORDER — OXYCODONE HCL 5 MG/5ML PO SOLN: 5.0000 mg | Freq: Once | ORAL | Status: DC | PRN

## 2021-10-13 MED ORDER — MIDAZOLAM HCL 5 MG/5ML IJ SOLN
INTRAMUSCULAR | Status: DC | PRN
  Administered 2021-10-13 (×2): 1 mg via INTRAVENOUS

## 2021-10-13 MED ORDER — MEPERIDINE HCL 25 MG/ML IJ SOLN: 6.2500 mg | INTRAMUSCULAR | Status: DC | PRN

## 2021-10-13 MED ORDER — FENTANYL CITRATE (PF) 100 MCG/2ML IJ SOLN: 25.0000 ug | INTRAMUSCULAR | Status: DC | PRN

## 2021-10-13 MED ORDER — LACTATED RINGERS IV SOLN: INTRAVENOUS | Status: DC

## 2021-10-13 MED ORDER — PROPOFOL 500 MG/50ML IV EMUL
INTRAVENOUS | Status: DC | PRN
Start: 2021-10-13 — End: 2021-10-13
  Administered 2021-10-13: 50 ug/kg/min via INTRAVENOUS

## 2021-10-13 MED ORDER — ONDANSETRON HCL 4 MG/2ML IJ SOLN
INTRAMUSCULAR | Status: AC
  Filled 2021-10-13: qty 2

## 2021-10-13 MED ORDER — BUPIVACAINE HCL (PF) 0.25 % IJ SOLN
INTRAMUSCULAR | Status: DC | PRN
  Administered 2021-10-13: 6 mL

## 2021-10-13 MED ORDER — ONDANSETRON HCL 4 MG/2ML IJ SOLN
INTRAMUSCULAR | Status: DC | PRN
  Administered 2021-10-13: 4 mg via INTRAVENOUS

## 2021-10-13 MED ORDER — ONDANSETRON HCL 4 MG/2ML IJ SOLN: 4.0000 mg | Freq: Once | INTRAMUSCULAR | Status: DC | PRN

## 2021-10-13 MED ORDER — ACETAMINOPHEN 325 MG PO TABS: 325.0000 mg | ORAL_TABLET | ORAL | Status: DC | PRN

## 2021-10-13 MED ORDER — PROPOFOL 10 MG/ML IV BOLUS
INTRAVENOUS | Status: DC | PRN
  Administered 2021-10-13: 40 mg via INTRAVENOUS

## 2021-10-13 MED ORDER — CEFAZOLIN SODIUM-DEXTROSE 2-4 GM/100ML-% IV SOLN
INTRAVENOUS | Status: AC
  Filled 2021-10-13: qty 100

## 2021-10-13 MED ORDER — FENTANYL CITRATE (PF) 100 MCG/2ML IJ SOLN
INTRAMUSCULAR | Status: DC | PRN
  Administered 2021-10-13 (×2): 50 ug via INTRAVENOUS

## 2021-10-13 MED ORDER — FENTANYL CITRATE (PF) 100 MCG/2ML IJ SOLN
INTRAMUSCULAR | Status: AC
  Filled 2021-10-13: qty 2

## 2021-10-13 MED ORDER — ACETAMINOPHEN 160 MG/5ML PO SOLN: 325.0000 mg | ORAL | Status: DC | PRN

## 2021-10-13 MED ORDER — OXYCODONE HCL 5 MG PO TABS: 5.0000 mg | ORAL_TABLET | Freq: Once | ORAL | Status: DC | PRN

## 2021-10-13 MED ORDER — LIDOCAINE HCL (PF) 0.5 % IJ SOLN
INTRAMUSCULAR | Status: DC | PRN
  Administered 2021-10-13: 30 mL via INTRAVENOUS

## 2021-10-13 MED ORDER — 0.9 % SODIUM CHLORIDE (POUR BTL) OPTIME
TOPICAL | Status: DC | PRN
  Administered 2021-10-13: 40 mL

## 2021-10-13 MED ORDER — CEFAZOLIN SODIUM-DEXTROSE 2-4 GM/100ML-% IV SOLN
2.0000 g | INTRAVENOUS | Status: AC
  Administered 2021-10-13: 2 g via INTRAVENOUS

## 2021-10-13 MED ORDER — PROPOFOL 10 MG/ML IV BOLUS
INTRAVENOUS | Status: AC
  Filled 2021-10-13: qty 20

## 2021-10-13 MED ORDER — TRAMADOL HCL 50 MG PO TABS: 50.0000 mg | ORAL_TABLET | Freq: Four times a day (QID) | ORAL | 0 refills | Status: DC | PRN

## 2021-10-13 MED ORDER — MIDAZOLAM HCL 2 MG/2ML IJ SOLN
INTRAMUSCULAR | Status: AC
  Filled 2021-10-13: qty 2

## 2021-10-13 SURGICAL SUPPLY — 31 items
APL PRP STRL LF DISP 70% ISPRP (MISCELLANEOUS) ×1
BLADE SURG 15 STRL LF DISP TIS (BLADE) ×1 IMPLANT
BLADE SURG 15 STRL SS (BLADE) ×2
BNDG CMPR 5X2 CHSV 1 LYR STRL (GAUZE/BANDAGES/DRESSINGS) ×1
BNDG CMPR 9X4 STRL LF SNTH (GAUZE/BANDAGES/DRESSINGS) ×1
BNDG COHESIVE 2X5 TAN ST LF (GAUZE/BANDAGES/DRESSINGS) ×2 IMPLANT
BNDG ESMARK 4X9 LF (GAUZE/BANDAGES/DRESSINGS) ×1 IMPLANT
CHLORAPREP W/TINT 26 (MISCELLANEOUS) ×2 IMPLANT
COVER BACK TABLE 60X90IN (DRAPES) ×2 IMPLANT
COVER MAYO STAND STRL (DRAPES) ×2 IMPLANT
DRAPE EXTREMITY T 121X128X90 (DISPOSABLE) ×2 IMPLANT
DRAPE U-SHAPE 47X51 STRL (DRAPES) ×1 IMPLANT
GAUZE SPONGE 4X4 12PLY STRL (GAUZE/BANDAGES/DRESSINGS) ×2 IMPLANT
GAUZE XEROFORM 1X8 LF (GAUZE/BANDAGES/DRESSINGS) ×2 IMPLANT
GLOVE SURG ENC MOIS LTX SZ6.5 (GLOVE) ×1 IMPLANT
GLOVE SURG ORTHO LTX SZ8 (GLOVE) ×2 IMPLANT
GLOVE SURG POLYISO LF SZ7 (GLOVE) ×2 IMPLANT
GLOVE SURG UNDER POLY LF SZ8.5 (GLOVE) ×2 IMPLANT
GOWN STRL REUS W/ TWL LRG LVL3 (GOWN DISPOSABLE) ×1 IMPLANT
GOWN STRL REUS W/TWL LRG LVL3 (GOWN DISPOSABLE) ×2
GOWN STRL REUS W/TWL XL LVL3 (GOWN DISPOSABLE) ×2 IMPLANT
NDL HYPO 27GX1-1/4 (NEEDLE) ×1 IMPLANT
NEEDLE HYPO 27GX1-1/4 (NEEDLE) ×2 IMPLANT
NS IRRIG 1000ML POUR BTL (IV SOLUTION) ×2 IMPLANT
PACK BASIN DAY SURGERY FS (CUSTOM PROCEDURE TRAY) ×2 IMPLANT
STOCKINETTE 4X48 STRL (DRAPES) ×2 IMPLANT
SUT ETHILON 4 0 PS 2 18 (SUTURE) ×2 IMPLANT
SYR BULB EAR ULCER 3OZ GRN STR (SYRINGE) ×2 IMPLANT
SYR CONTROL 10ML LL (SYRINGE) ×2 IMPLANT
TOWEL GREEN STERILE FF (TOWEL DISPOSABLE) ×4 IMPLANT
UNDERPAD 30X36 HEAVY ABSORB (UNDERPADS AND DIAPERS) ×2 IMPLANT

## 2021-10-13 NOTE — Anesthesia Preprocedure Evaluation (Addendum)
Anesthesia Evaluation  Patient identified by MRN, date of birth, ID band Patient awake    Reviewed: Allergy & Precautions, H&P , NPO status , Patient's Chart, lab work & pertinent test results  History of Anesthesia Complications (+) PONV and history of anesthetic complications  Airway Mallampati: I  TM Distance: >3 FB Neck ROM: full    Dental  (+) Teeth Intact, Dental Advidsory Given, Edentulous Upper,    Pulmonary sleep apnea , former smoker,    breath sounds clear to auscultation       Cardiovascular hypertension, + CAD, + Past MI and + Cardiac Stents   Rhythm:regular Rate:Normal  EKG: 10/28/2018 Rate 78 bpm Normal sinus rhythm   CV: Stress Echo 07/04/2018 The patient had no chest pain. The patient achieved 114 % of maximum predicted heart rate. The METS achieved was 6.7. Exercise capacity was average. Negative stress ECG for inducible ischemia at target heart rate. Negative exercise echocardiography for inducible ischemia at target heart  rate. LV contractility is not vigrous at peak stress but no focal wall motion  apparent -  REST ECHO Normal left ventricular function at rest. There were no segmental wall motion  abnormalities at rest. The estimated LV ejection fraction is 55-60% . - STRESS ECHO Normal left ventricular function and global wall motion with stress. There  were no segmental wall motion abnormalities post exercise. There was normal  increase in global LV function post exercise. The estimated LV ejection  fraction is 65-70% with stress. Negative exercise echocardiography for  inducible ischemia at target heart rate.   Neuro/Psych    GI/Hepatic   Endo/Other  diabetes, Type 2Morbid obesity  Renal/GU      Musculoskeletal  (+) Arthritis ,   Abdominal   Peds  Hematology   Anesthesia Other Findings   Reproductive/Obstetrics                            Anesthesia  Physical  Anesthesia Plan  ASA: 3  Anesthesia Plan: Bier Block and MAC and Bier Block-LIDOCAINE ONLY   Post-op Pain Management: Minimal or no pain anticipated   Induction: Intravenous  PONV Risk Score and Plan: 3 and Midazolam and Propofol infusion  Airway Management Planned: Nasal Cannula, Simple Face Mask, Mask and Natural Airway  Additional Equipment: None  Intra-op Plan:   Post-operative Plan:   Informed Consent: I have reviewed the patients History and Physical, chart, labs and discussed the procedure including the risks, benefits and alternatives for the proposed anesthesia with the patient or authorized representative who has indicated his/her understanding and acceptance.     Dental Advisory Given  Plan Discussed with: Anesthesiologist, CRNA and Surgeon  Anesthesia Plan Comments: (Discussed both nerve block for pain relief post-op and GA; including NV, sore throat, dental injury, and pulmonary complications)       Anesthesia Quick Evaluation

## 2021-10-13 NOTE — Op Note (Signed)
NAME: Mariah Kelley MEDICAL RECORD NO: [id number] DATE OF BIRTH: Aug 02, 1951 FAC[facility]: [facility] LOCATION: [facility] PHYSICIAN: Wynonia Sours, MD   OPERATIVE REPORT   DATE OF PROCEDURE: 10/13/21    PREOPERATIVE DIAGNOSIS: Trigger finger left right middle finger   POSTOPERATIVE DIAGNOSIS: Same   PROCEDURE: Release A1 pulley right middle   SURGEON: Daryll Brod, M.D.   ASSISTANT: none   ANESTHESIA:  Bier block with sedation and Local   INTRAVENOUS FLUIDS:  Per anesthesia flow sheet.   ESTIMATED BLOOD LOSS:  Minimal.   COMPLICATIONS:  None.   SPECIMENS:  none   TOURNIQUET TIME:    Total Tourniquet Time Documented: Forearm (Right) - 15 minutes Total: Forearm (Right) - 15 minutes    DISPOSITION:  Stable to PACU.   INDICATIONS: Patient is a 70 year old female with a history of triggering of the right middle finger which has become locked.  She is desires having this released.  Prepare postoperative course been discussed along with risks and complications.  She is aware there is no guarantee to the surgery the possibility of infection recurrence injury to arteries nerves tendons incomplete relief symptoms dystrophy.  In preoperative area the patient seen extremity marked by both patient and surgeon antibiotic given  OPERATIVE COURSE: Patient is brought to the operating room placed in supine position with the right arm free.  Forearm IV regional anesthetic was carried out without difficulty under the direction the anesthesia department.  Prep was done with ChloraPrep repair 3-minute dry time was allowed timeout taken confirm patient procedure.  The finger was able to be manipulated straight.  An oblique incision was made over the A1 pulley carried down through subcutaneous tissue.  Neurovascular structures were retracted radially and ulnarly the A1 pulley was then released on its radial aspect was found to be markedly tight.  A solid small incision was made  centrally and A2.  Significant tenosynovitis was present proximally.  2 tendons were then separated with retractors were breaking any adhesions.  The finger was placed through full passive range of motion no further triggering was noted.  The wound was copiously irrigated with saline and closed with interrupted 4-0 nylon sutures.  Local infiltration quarter percent bupivacaine without epinephrine 6 cc was used.  A sterile compressive dressing with the fingers free was applied.  Deflation and deflation of the tourniquet all fingers immediately pink.  She was taken to the recovery room for observation in satisfactory condition.  She will be discharged home to return to the hand center [facility] in 1 week on Ultram for pain.   Daryll Brod, MD Electronically signed, 10/13/21

## 2021-10-13 NOTE — Discharge Instructions (Addendum)

## 2021-10-13 NOTE — Anesthesia Procedure Notes (Signed)
Anesthesia Regional Block: Bier block (IV Regional)   Pre-Anesthetic Checklist: , timeout performed,  Correct Patient, Correct Site, Correct Laterality,  Correct Procedure,, site marked,  Surgical consent,  At surgeon's request  Laterality: Right and Lower         Needles:  Injection technique: Single-shot  Needle Type: Other      Needle Gauge: 22     Additional Needles:   Procedures:,,,,, intact distal pulses, Esmarch exsanguination,  Single tourniquet utilized    Narrative:  Start time: 10/13/2021 11:26 AM End time: 10/13/2021 11:26 AM  Performed by: Personally

## 2021-10-13 NOTE — H&P (Signed)
Mariah Kelley is an 70 y.o. female.   Chief Complaint: catching right middle finger HPI: Mariah Kelley is a 70 year old right-hand-dominant female with a long history of triggering of her right middle finger.  This been injected on at least 2 occasions he has a history of arthritis and diabetes but no history of thyroid problems or gout.  This continues to catch for her despite injections and she is developed a contracture of the PIP joint is desirous proceeding to have this surgically taken care of  Past Medical History:  Diagnosis Date   Arthritis    Asthma    Breast cancer of upper-outer quadrant of right female breast ([place]) 12/23/2014   Bruises easily    CAD S/P percutaneous coronary angioplasty 01/17/2014   takes Brilinta and Plavix daily   Cataracts, bilateral    Complication of anesthesia    COPD (chronic obstructive pulmonary disease) ([place])    Diabetes mellitus without complication ([place])    pre DM per pt. now after weight loss surgery   no meds   History of bronchitis 7-91yrs ago   History of tobacco abuse    40 pack-years   Hyperlipidemia    takes Lipitor daily   Hypertension    takes Metoprolol daily   Joint pain    Obesity    OSA treated with BiPAP    moderate with AHI 19/hr now on BIPAP auto   PONV (postoperative nausea and vomiting)    [place]   ST elevation myocardial infarction (STEMI) of inferolateral wall ([place]) 01/17/2014    Past Surgical History:  Procedure Laterality Date   ABDOMINAL HYSTERECTOMY     APPENDECTOMY     BREAST REDUCTION SURGERY Left 01/27/2015   BREAST REDUCTION SURGERY Left 01/27/2015   Procedure: MAMMARY REDUCTION  LEFT (BREAST);  Surgeon: Irene Limbo, MD;  Location: [place];  Service: Plastics;  Laterality: Left;   CATARACT EXTRACTION W/PHACO Right 04/11/2016   Procedure: CATARACT EXTRACTION PHACO AND INTRAOCULAR LENS PLACEMENT (IOC);  Surgeon: Rutherford Guys, MD;  Location: [facility];  Service: Ophthalmology;  Laterality: Right;  CDE: 14.15    CATARACT EXTRACTION W/PHACO Left 05/02/2016   Procedure: CATARACT EXTRACTION PHACO AND INTRAOCULAR LENS PLACEMENT (IOC);  Surgeon: Rutherford Guys, MD;  Location: [facility];  Service: Ophthalmology;  Laterality: Left;  CDE: 12.11   CHOLECYSTECTOMY     COLONOSCOPY     EYE SURGERY     LEFT HEART CATH Bilateral 01/17/2014   Procedure: LEFT HEART CATH;  Surgeon: Lorretta Harp, MD;  Location[facility];  Service: Cardiovascular;  Laterality: Bilateral;   MASTECTOMY W/ SENTINEL NODE BIOPSY Right 01/27/2015   dr Lucia Gaskins    MASTECTOMY W/ SENTINEL NODE BIOPSY Right 01/27/2015   Procedure: RIGHT MASTECTOMY WITH RIGHT AXILLARY  SENTINEL LYMPH NODE BIOPSY;  Surgeon: Alphonsa Overall, MD;  Location: [place];  Service: General;  Laterality: Right;   PERCUTANEOUS CORONARY STENT INTERVENTION (PCI-S)     PORTACATH PLACEMENT Left 03/23/2015   Procedure: INSERTION PORT-A-CATH ;  Surgeon: Alphonsa Overall, MD;  Location: [facility];  Service: General;  Laterality: Left;   sleeve gastrectomy     SPINAL FUSION     TOTAL KNEE ARTHROPLASTY Left 10/13/2019   Procedure: TOTAL KNEE ARTHROPLASTY;  Surgeon: Vickey Huger, MD;  Location: [facility];  Service: Orthopedics;  Laterality: Left;  75 mins needed for length of case.   TOTAL SHOULDER ARTHROPLASTY Left 04/19/2017   Procedure: TOTAL SHOULDER ARTHROPLASTY;  Surgeon: Tania Ade, MD;  Location: [place];  Service:  Orthopedics;  Laterality: Left;  Left total shoulder arthroplasty   TUBAL LIGATION      Family History  Problem Relation Age of Onset   Heart attack Mother 32       passed at 54   Heart attack Father 59       s/p CABG, multiple PCIs   CAD Brother        Still living   CAD Brother        Still living   Social History:  reports that she quit smoking about 12 years ago. Her smoking use included cigarettes. She has a 40.00 pack-year smoking history. She has quit using smokeless tobacco. She reports that she does not drink alcohol and does not use drugs.  Allergies:   Allergies  Allergen Reactions   Prednisone Other (See Comments)    "made poison ivy worse", rebound effect when take prednisone   Codeine Nausea Only and Other (See Comments)    HEADACHE    No medications prior to admission.    Results for orders placed or performed during the hospital encounter of 10/13/21 (from the past 48 hour(s))  Basic metabolic panel per protocol     Status: Abnormal   Collection Time: 10/11/21  8:50 AM  Result Value Ref Range   Sodium 140 135 - 145 mmol/L   Potassium 4.2 3.5 - 5.1 mmol/L   Chloride 107 98 - 111 mmol/L   CO2 24 22 - 32 mmol/L   Glucose, Bld 144 (H) 70 - 99 mg/dL    Comment: Glucose reference range applies only to samples taken after fasting for at least 8 hours.   BUN 12 8 - 23 mg/dL   Creatinine, Ser 0.89 0.44 - 1.00 mg/dL   Calcium 9.5 8.9 - 10.3 mg/dL   GFR, Estimated >60 >60 mL/min    Comment: (NOTE) Calculated using the CKD-EPI Creatinine Equation (2021)    Anion gap 9 5 - 15    Comment: Performed at [facility] [address] [place], [place] [address]    No results found.   Pertinent items are noted in HPI.  Height 5\' 9"  (9.449 m), weight 124.5 kg.  General appearance: alert, cooperative, and appears stated age Head: Normocephalic, without obvious abnormality Neck: no JVD Resp: clear to auscultation bilaterally Cardio: regular rate and rhythm, S1, S2 normal, no murmur, click, rub or gallop GI: soft, non-tender; bowel sounds normal; no masses,  no organomegaly Extremities:  Catching right middle Pulses: 2+ and symmetric Skin: Skin color, texture, turgor normal. No rashes or lesions Neurologic: Grossly normal Incision/Wound: na  Assessment/Plan Diagnosis stenosing tenosynovitis with flexion contracture PIP joint right middle Plan: She would like to have this surgically released. We will schedule for release A1 pulley right middle finger a repair and postoperative course are discussed along with risk and  complications. She is aware there is no guarantee to the surgery possibility of infection recurrence injury to arteries nerves tendons incomplete relief symptoms dystrophy.. If therapy is going to be necessary to try to improve her flexion deformity to the PIP joint. She is scheduled for release A1 pulley right middle finger as an outpatient under regional anesthesia. Daryll Brod 10/13/2021, 8:31 AM

## 2021-10-13 NOTE — Brief Op Note (Signed)
10/13/2021  11:45 AM  PATIENT:  Mariah Kelley  70 y.o. female  PRE-OPERATIVE DIAGNOSIS:  RIGHT MIDDLE FINGER TRIGGER  POST-OPERATIVE DIAGNOSIS:  RIGHT MIDDLE FINGER TRIGGER  PROCEDURE:  Procedure(s): RELEASE TRIGGER FINGER/A-1 PULLEY RIGHT MIDDLE FINGER (Right)  SURGEON:  Surgeon(s) and Role:    * Daryll Brod, MD - Primary  PHYSICIAN ASSISTANT:   ASSISTANTS: none   ANESTHESIA:   local, regional, and IV sedation  EBL:  32ml  BLOOD ADMINISTERED:none  DRAINS: none   LOCAL MEDICATIONS USED:  BUPIVICAINE   SPECIMEN:  No Specimen  DISPOSITION OF SPECIMEN:  N/A  COUNTS:  YES  TOURNIQUET:   Total Tourniquet Time Documented: Forearm (Right) - 15 minutes Total: Forearm (Right) - 15 minutes   DICTATION: .Dragon Dictation  PLAN OF CARE: Discharge to home after PACU  PATIENT DISPOSITION:  PACU - hemodynamically stable.

## 2021-10-13 NOTE — Anesthesia Postprocedure Evaluation (Signed)
Anesthesia Post Note  Patient: Mariah Kelley  Procedure(s) Performed: RELEASE TRIGGER FINGER/A-1 PULLEY RIGHT MIDDLE FINGER (Right: Finger)     Patient location during evaluation: PACU Anesthesia Type: MAC Level of consciousness: awake and alert Pain management: pain level controlled Vital Signs Assessment: post-procedure vital signs reviewed and stable Respiratory status: spontaneous breathing, nonlabored ventilation, respiratory function stable and patient connected to nasal cannula oxygen Cardiovascular status: stable and blood pressure returned to baseline Postop Assessment: no apparent nausea or vomiting Anesthetic complications: no   No notable events documented.  Last Vitals:  Vitals:   10/13/21 1200 10/13/21 1220  BP: (!) 146/67 (!) 159/79  Pulse: 71 64  Resp: 17 16  Temp:  (!) 36.2 C  SpO2: 98% 95%    Last Pain:  Vitals:   10/13/21 1220  TempSrc:   PainSc: 0-No pain                 Ayde Record

## 2021-10-13 NOTE — Transfer of Care (Signed)
Immediate Anesthesia Transfer of Care Note  Patient: Mariah Kelley  Procedure(s) Performed: RELEASE TRIGGER FINGER/A-1 PULLEY RIGHT MIDDLE FINGER (Right: Finger)  Patient Location: PACU  Anesthesia Type:MAC and Bier block  Level of Consciousness: awake, alert  and oriented  Airway & Oxygen Therapy: Patient Spontanous Breathing  Post-op Assessment: Report given to RN and Post -op Vital signs reviewed and stable  Post vital signs: Reviewed and stable  Last Vitals:  Vitals Value Taken Time  BP 142/75 10/13/21 1149  Temp    Pulse 70 10/13/21 1152  Resp 15 10/13/21 1152  SpO2 96 % 10/13/21 1152  Vitals shown include unvalidated device data.  Last Pain:  Vitals:   10/13/21 0932  TempSrc: Oral  PainSc: 0-No pain      Patients Stated Pain Goal: 3 (09/07/10 1735)  Complications: No notable events documented.

## 2021-10-14 ENCOUNTER — Encounter (HOSPITAL_BASED_OUTPATIENT_CLINIC_OR_DEPARTMENT_OTHER): Payer: Self-pay | Admitting: Orthopedic Surgery

## 2021-10-19 DIAGNOSIS — E785 Hyperlipidemia, unspecified: Secondary | ICD-10-CM | POA: Diagnosis not present

## 2021-10-19 DIAGNOSIS — E1121 Type 2 diabetes mellitus with diabetic nephropathy: Secondary | ICD-10-CM | POA: Diagnosis not present

## 2021-10-19 DIAGNOSIS — I10 Essential (primary) hypertension: Secondary | ICD-10-CM | POA: Diagnosis not present

## 2021-10-20 ENCOUNTER — Other Ambulatory Visit: Payer: Self-pay | Admitting: Cardiovascular Disease

## 2021-10-21 DIAGNOSIS — E1121 Type 2 diabetes mellitus with diabetic nephropathy: Secondary | ICD-10-CM | POA: Diagnosis not present

## 2021-10-21 DIAGNOSIS — Z23 Encounter for immunization: Secondary | ICD-10-CM | POA: Diagnosis not present

## 2021-10-21 DIAGNOSIS — E785 Hyperlipidemia, unspecified: Secondary | ICD-10-CM | POA: Diagnosis not present

## 2021-11-10 ENCOUNTER — Telehealth: Payer: Self-pay

## 2021-11-10 ENCOUNTER — Other Ambulatory Visit: Payer: Self-pay

## 2021-11-10 DIAGNOSIS — E785 Hyperlipidemia, unspecified: Secondary | ICD-10-CM

## 2021-11-10 NOTE — Telephone Encounter (Signed)
Your Leqvio injection is scheduled on 11/16/21 at 1030. Please arrive at [facility] and enter the hospital through [place] (the [employer]) that's located at [employer] 15 minutes before your scheduled injection time. Walk in to the registration desk and let them know that you are scheduled for an injection in the infusion center. They will register you and take you to your appointment.   Marion Downer is a subcutaneous injection that will lower your LDL cholesterol by 50%. If this is your first injection, your next injection will be due in 3 months. If this is NOT your first injection, your next injection will be due in 6 months. If you have not heard from [facility] to schedule your next appointment within 2 weeks of your next anticipated injection, please call [facility] to schedule this at:                [phone] - if you are seen at the [facility] office               [phone] - if you are seen at the [facility] office

## 2021-11-10 NOTE — Telephone Encounter (Signed)
Called and lmom pt that they were approved to take the leqvio injection its gonna be free after $226 deductible. Awaiting pt callback to schedule

## 2021-11-16 ENCOUNTER — Ambulatory Visit (HOSPITAL_COMMUNITY)
Admission: RE | Admit: 2021-11-16 | Discharge: 2021-11-16 | Disposition: A | Discharge status: Home / Self Care | Payer: Medicare Other | Source: Ambulatory Visit | Attending: Cardiovascular Disease | Admitting: Cardiovascular Disease

## 2021-11-16 ENCOUNTER — Other Ambulatory Visit: Payer: Self-pay

## 2021-11-16 DIAGNOSIS — E785 Hyperlipidemia, unspecified: Secondary | ICD-10-CM | POA: Diagnosis not present

## 2021-11-16 MED ORDER — INCLISIRAN SODIUM 284 MG/1.5ML ~~LOC~~ SOSY
284.0000 mg | PREFILLED_SYRINGE | Freq: Once | SUBCUTANEOUS | Status: AC
  Administered 2021-11-16: 284 mg via SUBCUTANEOUS

## 2021-11-16 MED ORDER — INCLISIRAN SODIUM 284 MG/1.5ML ~~LOC~~ SOSY
PREFILLED_SYRINGE | SUBCUTANEOUS | Status: AC
  Filled 2021-11-16: qty 1.5

## 2021-11-17 ENCOUNTER — Other Ambulatory Visit: Payer: Self-pay

## 2021-11-17 DIAGNOSIS — E785 Hyperlipidemia, unspecified: Secondary | ICD-10-CM

## 2021-11-22 ENCOUNTER — Other Ambulatory Visit: Payer: Self-pay

## 2021-11-22 DIAGNOSIS — E785 Hyperlipidemia, unspecified: Secondary | ICD-10-CM

## 2021-11-24 DIAGNOSIS — E119 Type 2 diabetes mellitus without complications: Secondary | ICD-10-CM | POA: Diagnosis not present

## 2021-11-24 DIAGNOSIS — Z961 Presence of intraocular lens: Secondary | ICD-10-CM | POA: Diagnosis not present

## 2021-11-28 ENCOUNTER — Other Ambulatory Visit: Payer: Self-pay | Admitting: Cardiovascular Disease

## 2021-12-01 DIAGNOSIS — U071 COVID-19: Secondary | ICD-10-CM | POA: Diagnosis not present

## 2021-12-23 ENCOUNTER — Other Ambulatory Visit: Payer: Self-pay | Admitting: Cardiovascular Disease

## 2022-01-05 DIAGNOSIS — Z20822 Contact with and (suspected) exposure to covid-19: Secondary | ICD-10-CM | POA: Diagnosis not present

## 2022-01-25 DIAGNOSIS — M180 Bilateral primary osteoarthritis of first carpometacarpal joints: Secondary | ICD-10-CM | POA: Diagnosis not present

## 2022-02-02 DIAGNOSIS — Z20822 Contact with and (suspected) exposure to covid-19: Secondary | ICD-10-CM | POA: Diagnosis not present

## 2022-02-08 ENCOUNTER — Ambulatory Visit (HOSPITAL_COMMUNITY)
Admission: RE | Admit: 2022-02-08 | Discharge: 2022-02-08 | Disposition: A | Discharge status: Home / Self Care | Payer: Medicare Other | Source: Ambulatory Visit | Attending: Cardiovascular Disease | Admitting: Cardiovascular Disease

## 2022-02-08 ENCOUNTER — Encounter: Payer: Self-pay | Admitting: Hematology and Oncology

## 2022-02-08 DIAGNOSIS — E785 Hyperlipidemia, unspecified: Secondary | ICD-10-CM | POA: Diagnosis not present

## 2022-02-08 DIAGNOSIS — Z1231 Encounter for screening mammogram for malignant neoplasm of breast: Secondary | ICD-10-CM | POA: Diagnosis not present

## 2022-02-08 MED ORDER — INCLISIRAN SODIUM 284 MG/1.5ML ~~LOC~~ SOSY
PREFILLED_SYRINGE | SUBCUTANEOUS | Status: AC
  Filled 2022-02-08: qty 1.5

## 2022-02-08 MED ORDER — INCLISIRAN SODIUM 284 MG/1.5ML ~~LOC~~ SOSY
284.0000 mg | PREFILLED_SYRINGE | Freq: Once | SUBCUTANEOUS | Status: AC
  Administered 2022-02-08: 284 mg via SUBCUTANEOUS

## 2022-02-09 MED FILL — Inclisiran Sodium Subcutaneous Soln Pref Syr 284 MG/1.5ML: SUBCUTANEOUS | Qty: 1.5 | Status: AC

## 2022-02-15 ENCOUNTER — Other Ambulatory Visit: Payer: Self-pay

## 2022-02-15 DIAGNOSIS — E785 Hyperlipidemia, unspecified: Secondary | ICD-10-CM

## 2022-02-15 DIAGNOSIS — I252 Old myocardial infarction: Secondary | ICD-10-CM

## 2022-02-23 DIAGNOSIS — Z20822 Contact with and (suspected) exposure to covid-19: Secondary | ICD-10-CM | POA: Diagnosis not present

## 2022-02-24 NOTE — Progress Notes (Signed)
? ?Patient Care Team: ?Jonathon Jordan, MD as PCP - General (Family Medicine) ?Lorretta Harp, MD as PCP - Cardiology (Cardiology) ?Alphonsa Overall, MD as Consulting Physician (General Surgery) ?Nicholas Lose, MD as Consulting Physician (Hematology and Oncology) ?Thea Silversmith, MD as Consulting Physician (Radiation Oncology) ?Mauro Kaufmann, RN as Registered Nurse ?Rockwell Germany, RN as Registered Nurse ?Holley Bouche, NP (Inactive) as Nurse Practitioner (Nurse Practitioner) ?Irene Limbo, MD as Consulting Physician (Plastic Surgery) ?Sylvan Cheese, NP as Nurse Practitioner (Nurse Practitioner) ? ?DIAGNOSIS:  ?Encounter Diagnosis  ?Name Primary?  ? Malignant neoplasm of upper-outer quadrant of right breast in female, estrogen receptor positive ([place])   ? ? ?SUMMARY OF ONCOLOGIC HISTORY: ?Oncology History  ?Malignant neoplasm of upper-outer quadrant of right breast in female, estrogen receptor positive ([place])  ?04/29/2014 Procedure  ? Prior right breast bx: fibrocystic changes with calcifications, no evidence of malignancy. ? ?  ?12/11/2014 Mammogram  ? Right breast: focal asymmetric density with indistinct margin at 11:00 middle depth. Also area of grouped heterogenous calcifications at 12:00, middle depth, increased in number. ? ?  ?12/11/2014 Breast US  ? Right breast: 2 cm round mass with irregular margin in UOQ, posterior depth; hypoechoic with posterior acoustic enhancement; lobulated LN with focal cortex thickening in right axilla ? ?  ?12/17/2014 Initial Biopsy  ? Right breast biopsy: Invasive ductal carcinoma with DCIS, right axillary lymph node benign ? ?  ?12/24/2014 Procedure  ? Right breast needle core bx: at least atypical ductal hyperplasia with associated calcifications - suspicious for DCIS ? ?  ?12/29/2014 Breast MRI  ? 2.1 cm mass in the upper-outer quadrant of the right breast 2 possible satellite nodules associated with this mass. NME 2.2cm, together 5.1 cm ? ?  ?12/29/2014  Clinical Stage  ? Stage IIA: T2 N0 ? ?  ?01/27/2015 Definitive Surgery  ? Right breast mastectomy/SLNB Lucia Gaskins): invasive ductal carcinoma, grade 2, with DCIS (int. grade), spanning 2.2 cm, ER+ (100%), PR- (0%,) HER-2 negative (ratio 1.53), Ki-67 21%. 1 LN negative for malignancy (0/1) ? ?  ?01/27/2015 Pathologic Stage  ? Stage IIA: pT2 pN0 ? ?  ?01/27/2015 Oncotype testing  ? Score: 25 (16% ROR) ? ?  ?03/03/2015 - 05/04/2015 Chemotherapy  ? Adjuvant chemotherapy with docetaxel and cyclophosphamide ?4 ([place]). ? ?  ?06/04/2015 -  Anti-estrogen oral therapy  ? Anastrozole 1 mg daily. Planned duration of therapy 7 years Lindi Adie) ?  ?08/25/2015 Survivorship  ? Survivorship care plan completed and mailed to patient in lieu of in person visit ? ?  ? ? ?CHIEF COMPLIANT: Follow-up of right breast cancer on anastrozole ? ?INTERVAL HISTORY: Mariah Kelley is a 71 y.o. with above-mentioned history of right breast cancer who is currently on anastrozole therapy. She presents to the clinic today for follow-up.  ? ? ?ALLERGIES:  is allergic to prednisone and codeine. ? ?MEDICATIONS:  ?Current Outpatient Medications  ?Medication Sig Dispense Refill  ? albuterol (VENTOLIN HFA) 108 (90 Base) MCG/ACT inhaler Inhale 2 puffs into the lungs every 6 (six) hours as needed for wheezing or shortness of breath. 6.7 g 5  ? aspirin 81 MG chewable tablet Chew by mouth daily. (Patient not taking: Reported on 10/11/2021)    ? atorvastatin (LIPITOR) 80 MG tablet TAKE 1 TABLET BY MOUTH DAILY AT 6 PM. 90 tablet 3  ? calcium citrate-vitamin D 500-500 MG-UNIT chewable tablet Chew 1 tablet by mouth 3 (three) times daily.    ? inclisiran (LEQVIO) 284 MG/1.5ML SOSY injection Inject 284  mg into the skin every 6 (six) months.    ? metoprolol tartrate (LOPRESSOR) 50 MG tablet Take 1 tablet (50 mg total) by mouth 2 (two) times daily. 60 tablet 3  ? nitroGLYCERIN (NITROSTAT) 0.4 MG SL tablet PLACE 1 TABLET UNDER THE TONGUE EVERY 5 MINUTES AS NEEDED FOR CHEST  PAIN. 25 tablet 3  ? Semaglutide,0.25 or 0.5MG/DOS, (OZEMPIC, 0.25 OR 0.5 MG/DOSE,) 2 MG/1.5ML SOPN Inject 5 mg into the skin.    ? ?No current facility-administered medications for this visit.  ? ? ?PHYSICAL EXAMINATION: ?ECOG PERFORMANCE STATUS: 1 - Symptomatic but completely ambulatory ? ?Vitals:  ? 03/10/22 0823  ?BP: (!) 154/76  ?Pulse: 76  ?Resp: 16  ?Temp: 98.1 ?F (36.7 ?C)  ?SpO2: 97%  ? ?Filed Weights  ? 03/10/22 0823  ?Weight: 245 lb 14.4 oz (111.5 kg)  ? ? ?BREAST: No palpable lumps or nodules in the right mastectomy scar or axilla.  No lumps or nodules in the left breast or axilla (exam performed in the presence of a chaperone) ? ?LABORATORY DATA:  ?I have reviewed the data as listed ? ?  Latest Ref Rng & Units 10/11/2021  ?  8:50 AM 10/10/2019  ?  1:55 PM 10/16/2018  ?  8:22 AM  ?CMP  ?Glucose 70 - 99 mg/dL 144   96     ?BUN 8 - 23 mg/dL 12   22     ?Creatinine 0.44 - 1.00 mg/dL 0.89   0.75     ?Sodium 135 - 145 mmol/L 140   143     ?Potassium 3.5 - 5.1 mmol/L 4.2   4.5     ?Chloride 98 - 111 mmol/L 107   108     ?CO2 22 - 32 mmol/L 24   27     ?Calcium 8.9 - 10.3 mg/dL 9.5   9.9     ?Total Protein 6.5 - 8.1 g/dL  6.9   6.6    ?Total Bilirubin 0.3 - 1.2 mg/dL  0.7   0.7    ?Alkaline Phos 38 - 126 U/L  80   96    ?AST 15 - 41 U/L  23   19    ?ALT 0 - 44 U/L  27   23    ? ? ?Lab Results  ?Component Value Date  ? WBC 6.2 10/10/2019  ? HGB 12.2 10/10/2019  ? HCT 38.9 10/10/2019  ? MCV 92.2 10/10/2019  ? PLT 260 10/10/2019  ? NEUTROABS 3.4 10/10/2019  ? ? ?ASSESSMENT & PLAN:  ?Malignant neoplasm of upper-outer quadrant of right breast in female, estrogen receptor positive ([place]) ?Right breast mastectomy 01/27/2015: 2.2 cm grade 2 invasive ductal carcinoma ER 100%, PR 0%, HER-2 negative, Ki-67 21%, margins negative, sentinel lymph nodes negative Oncotype score 25; 16 % risk of recurrence with tamoxifen alone; status post adjuvant chemotherapy with Taxotere and Cytoxan started 03/03/2015 completed 05/04/2015,  started anastrozole 06/07/2015 ?  ?Anastrozole toxicities: ?No major side effects anastrozole. ?August 2023 she will complete 7 years of antiestrogen therapy and therefore she can stop it at that time. ?  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  ?Breast Cancer Surveillance: ?1. Breast exam 03/10/2022: Benign ?2. Mammograms 02/08/22: Benign breast density category B ?   ?Bone density September 2019: T score -1.6: Osteopenia ? ?Return to clinic on an as-needed basis ? ?No orders of  the defined types were placed in this encounter. ? ?The patient has a good understanding of the overall plan. she agrees with it. she will call with any problems that may develop before the next visit here. ?Total time spent: 30 mins including face to face time and time spent for planning, charting and co-ordination of care ? ? Harriette Ohara, MD ?03/10/22 ? ? ? I Gardiner Coins am scribing for Dr. Lindi Adie ? ?I have reviewed the above documentation for accuracy and completeness, and I agree with the above. ?  ?

## 2022-02-28 DIAGNOSIS — Z20822 Contact with and (suspected) exposure to covid-19: Secondary | ICD-10-CM | POA: Diagnosis not present

## 2022-03-09 DIAGNOSIS — Z20822 Contact with and (suspected) exposure to covid-19: Secondary | ICD-10-CM | POA: Diagnosis not present

## 2022-03-10 ENCOUNTER — Other Ambulatory Visit: Payer: Self-pay

## 2022-03-10 ENCOUNTER — Inpatient Hospital Stay: Payer: Medicare Other | Attending: Hematology and Oncology | Admitting: Hematology and Oncology

## 2022-03-10 DIAGNOSIS — Z79899 Other long term (current) drug therapy: Secondary | ICD-10-CM | POA: Insufficient documentation

## 2022-03-10 DIAGNOSIS — Z170 Estrogen receptor positive status [ER+]: Secondary | ICD-10-CM | POA: Diagnosis not present

## 2022-03-10 DIAGNOSIS — C50411 Malignant neoplasm of upper-outer quadrant of right female breast: Secondary | ICD-10-CM | POA: Insufficient documentation

## 2022-03-10 NOTE — Assessment & Plan Note (Signed)
Right breast mastectomy 01/27/2015: 2.2 cm grade 2 invasive ductal carcinoma ER 100%, PR 0%, HER-2 negative, Ki-67 21%, margins negative, sentinel lymph nodes negative Oncotype score 25; 16 % risk of recurrence with tamoxifen alone; status post adjuvant chemotherapy with Taxotere and Cytoxan started 03/03/2015 completed 05/04/2015, started anastrozole 06/07/2015 ?? ?Anastrozole toxicities: ?No major side effects anastrozole. ?August 2023 she will complete 7 years of antiestrogen therapy and therefore she can stop it at that time. ?? 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 ?Breast Cancer Surveillance: ?1. Breast exam?03/10/2022: Benign ?2. Mammograms 02/08/22: Benign breast density category B ??? ?Bone density September 2019: T score -1.6: Osteopenia ?

## 2022-03-14 DIAGNOSIS — Z20828 Contact with and (suspected) exposure to other viral communicable diseases: Secondary | ICD-10-CM | POA: Diagnosis not present

## 2022-03-26 ENCOUNTER — Other Ambulatory Visit: Payer: Self-pay | Admitting: Cardiovascular Disease

## 2022-03-31 ENCOUNTER — Other Ambulatory Visit: Payer: Self-pay | Admitting: Hematology and Oncology

## 2022-03-31 DIAGNOSIS — Z170 Estrogen receptor positive status [ER+]: Secondary | ICD-10-CM

## 2022-04-05 DIAGNOSIS — E7800 Pure hypercholesterolemia, unspecified: Secondary | ICD-10-CM | POA: Diagnosis not present

## 2022-04-05 DIAGNOSIS — I10 Essential (primary) hypertension: Secondary | ICD-10-CM | POA: Diagnosis not present

## 2022-04-05 DIAGNOSIS — E1121 Type 2 diabetes mellitus with diabetic nephropathy: Secondary | ICD-10-CM | POA: Diagnosis not present

## 2022-04-21 DIAGNOSIS — M180 Bilateral primary osteoarthritis of first carpometacarpal joints: Secondary | ICD-10-CM | POA: Diagnosis not present

## 2022-04-21 DIAGNOSIS — M65322 Trigger finger, left index finger: Secondary | ICD-10-CM | POA: Diagnosis not present

## 2022-07-26 DIAGNOSIS — E1121 Type 2 diabetes mellitus with diabetic nephropathy: Secondary | ICD-10-CM | POA: Diagnosis not present

## 2022-07-26 DIAGNOSIS — Z79899 Other long term (current) drug therapy: Secondary | ICD-10-CM | POA: Diagnosis not present

## 2022-07-26 DIAGNOSIS — E785 Hyperlipidemia, unspecified: Secondary | ICD-10-CM | POA: Diagnosis not present

## 2022-07-26 DIAGNOSIS — E559 Vitamin D deficiency, unspecified: Secondary | ICD-10-CM | POA: Diagnosis not present

## 2022-07-28 DIAGNOSIS — I25118 Atherosclerotic heart disease of native coronary artery with other forms of angina pectoris: Secondary | ICD-10-CM | POA: Diagnosis not present

## 2022-07-28 DIAGNOSIS — E785 Hyperlipidemia, unspecified: Secondary | ICD-10-CM | POA: Diagnosis not present

## 2022-07-28 DIAGNOSIS — J449 Chronic obstructive pulmonary disease, unspecified: Secondary | ICD-10-CM | POA: Diagnosis not present

## 2022-07-28 DIAGNOSIS — C50919 Malignant neoplasm of unspecified site of unspecified female breast: Secondary | ICD-10-CM | POA: Diagnosis not present

## 2022-07-28 DIAGNOSIS — E1121 Type 2 diabetes mellitus with diabetic nephropathy: Secondary | ICD-10-CM | POA: Diagnosis not present

## 2022-07-28 DIAGNOSIS — E559 Vitamin D deficiency, unspecified: Secondary | ICD-10-CM | POA: Diagnosis not present

## 2022-07-28 DIAGNOSIS — F17211 Nicotine dependence, cigarettes, in remission: Secondary | ICD-10-CM | POA: Diagnosis not present

## 2022-07-28 DIAGNOSIS — I129 Hypertensive chronic kidney disease with stage 1 through stage 4 chronic kidney disease, or unspecified chronic kidney disease: Secondary | ICD-10-CM | POA: Diagnosis not present

## 2022-07-28 DIAGNOSIS — Z79899 Other long term (current) drug therapy: Secondary | ICD-10-CM | POA: Diagnosis not present

## 2022-07-28 DIAGNOSIS — Z23 Encounter for immunization: Secondary | ICD-10-CM | POA: Diagnosis not present

## 2022-07-28 DIAGNOSIS — I10 Essential (primary) hypertension: Secondary | ICD-10-CM | POA: Diagnosis not present

## 2022-07-28 DIAGNOSIS — Z0000 Encounter for general adult medical examination without abnormal findings: Secondary | ICD-10-CM | POA: Diagnosis not present

## 2022-07-31 DIAGNOSIS — M85851 Other specified disorders of bone density and structure, right thigh: Secondary | ICD-10-CM | POA: Diagnosis not present

## 2022-07-31 DIAGNOSIS — M85852 Other specified disorders of bone density and structure, left thigh: Secondary | ICD-10-CM | POA: Diagnosis not present

## 2022-07-31 DIAGNOSIS — Z780 Asymptomatic menopausal state: Secondary | ICD-10-CM | POA: Diagnosis not present

## 2022-08-04 ENCOUNTER — Other Ambulatory Visit (HOSPITAL_COMMUNITY): Payer: Self-pay

## 2022-08-04 ENCOUNTER — Other Ambulatory Visit (HOSPITAL_BASED_OUTPATIENT_CLINIC_OR_DEPARTMENT_OTHER): Payer: Self-pay

## 2022-08-04 MED ORDER — OZEMPIC (2 MG/DOSE) 8 MG/3ML ~~LOC~~ SOPN
PEN_INJECTOR | SUBCUTANEOUS | 5 refills | Status: DC
  Filled 2022-08-04: qty 3, 28d supply, fill #0
  Filled 2022-08-30: qty 3, 28d supply, fill #1
  Filled 2022-10-01: qty 3, 28d supply, fill #2
  Filled 2022-10-25: qty 3, 28d supply, fill #3
  Filled 2022-11-27: qty 3, 28d supply, fill #4
  Filled 2022-12-28 (×2): qty 3, 28d supply, fill #5

## 2022-08-07 ENCOUNTER — Other Ambulatory Visit (HOSPITAL_BASED_OUTPATIENT_CLINIC_OR_DEPARTMENT_OTHER): Payer: Self-pay

## 2022-08-09 ENCOUNTER — Other Ambulatory Visit: Payer: Self-pay | Admitting: Family Medicine

## 2022-08-09 DIAGNOSIS — F17211 Nicotine dependence, cigarettes, in remission: Secondary | ICD-10-CM

## 2022-08-10 ENCOUNTER — Ambulatory Visit (HOSPITAL_COMMUNITY)
Admission: RE | Admit: 2022-08-10 | Discharge: 2022-08-10 | Disposition: A | Discharge status: Home / Self Care | Payer: Medicare Other | Source: Ambulatory Visit | Attending: Cardiovascular Disease | Admitting: Cardiovascular Disease

## 2022-08-10 DIAGNOSIS — I252 Old myocardial infarction: Secondary | ICD-10-CM | POA: Diagnosis not present

## 2022-08-10 DIAGNOSIS — E785 Hyperlipidemia, unspecified: Secondary | ICD-10-CM | POA: Diagnosis not present

## 2022-08-10 MED ORDER — INCLISIRAN SODIUM 284 MG/1.5ML ~~LOC~~ SOSY
PREFILLED_SYRINGE | SUBCUTANEOUS | Status: AC
  Filled 2022-08-10: qty 1.5

## 2022-08-10 MED ORDER — INCLISIRAN SODIUM 284 MG/1.5ML ~~LOC~~ SOSY
284.0000 mg | PREFILLED_SYRINGE | Freq: Once | SUBCUTANEOUS | Status: AC
  Administered 2022-08-10: 284 mg via SUBCUTANEOUS

## 2022-08-18 ENCOUNTER — Ambulatory Visit
Admission: RE | Admit: 2022-08-18 | Discharge: 2022-08-18 | Disposition: A | Discharge status: Home / Self Care | Payer: Medicare Other | Source: Ambulatory Visit | Attending: Family Medicine | Admitting: Family Medicine

## 2022-08-18 DIAGNOSIS — J432 Centrilobular emphysema: Secondary | ICD-10-CM | POA: Diagnosis not present

## 2022-08-18 DIAGNOSIS — I2510 Atherosclerotic heart disease of native coronary artery without angina pectoris: Secondary | ICD-10-CM | POA: Diagnosis not present

## 2022-08-18 DIAGNOSIS — F17211 Nicotine dependence, cigarettes, in remission: Secondary | ICD-10-CM

## 2022-08-18 DIAGNOSIS — Z87891 Personal history of nicotine dependence: Secondary | ICD-10-CM | POA: Diagnosis not present

## 2022-08-18 DIAGNOSIS — J929 Pleural plaque without asbestos: Secondary | ICD-10-CM | POA: Diagnosis not present

## 2022-08-24 DIAGNOSIS — M7062 Trochanteric bursitis, left hip: Secondary | ICD-10-CM | POA: Diagnosis not present

## 2022-08-24 DIAGNOSIS — M7061 Trochanteric bursitis, right hip: Secondary | ICD-10-CM | POA: Diagnosis not present

## 2022-08-30 ENCOUNTER — Other Ambulatory Visit (HOSPITAL_BASED_OUTPATIENT_CLINIC_OR_DEPARTMENT_OTHER): Payer: Self-pay

## 2022-08-30 DIAGNOSIS — M7062 Trochanteric bursitis, left hip: Secondary | ICD-10-CM | POA: Diagnosis not present

## 2022-08-30 DIAGNOSIS — M6281 Muscle weakness (generalized): Secondary | ICD-10-CM | POA: Diagnosis not present

## 2022-08-30 DIAGNOSIS — M7061 Trochanteric bursitis, right hip: Secondary | ICD-10-CM | POA: Diagnosis not present

## 2022-08-31 ENCOUNTER — Other Ambulatory Visit (HOSPITAL_BASED_OUTPATIENT_CLINIC_OR_DEPARTMENT_OTHER): Payer: Self-pay

## 2022-09-01 DIAGNOSIS — M6281 Muscle weakness (generalized): Secondary | ICD-10-CM | POA: Diagnosis not present

## 2022-09-01 DIAGNOSIS — M7062 Trochanteric bursitis, left hip: Secondary | ICD-10-CM | POA: Diagnosis not present

## 2022-09-01 DIAGNOSIS — M7061 Trochanteric bursitis, right hip: Secondary | ICD-10-CM | POA: Diagnosis not present

## 2022-09-04 DIAGNOSIS — M6281 Muscle weakness (generalized): Secondary | ICD-10-CM | POA: Diagnosis not present

## 2022-09-04 DIAGNOSIS — M7062 Trochanteric bursitis, left hip: Secondary | ICD-10-CM | POA: Diagnosis not present

## 2022-09-04 DIAGNOSIS — M7061 Trochanteric bursitis, right hip: Secondary | ICD-10-CM | POA: Diagnosis not present

## 2022-09-08 DIAGNOSIS — M6281 Muscle weakness (generalized): Secondary | ICD-10-CM | POA: Diagnosis not present

## 2022-09-08 DIAGNOSIS — M7062 Trochanteric bursitis, left hip: Secondary | ICD-10-CM | POA: Diagnosis not present

## 2022-09-08 DIAGNOSIS — M7061 Trochanteric bursitis, right hip: Secondary | ICD-10-CM | POA: Diagnosis not present

## 2022-09-11 DIAGNOSIS — M6281 Muscle weakness (generalized): Secondary | ICD-10-CM | POA: Diagnosis not present

## 2022-09-11 DIAGNOSIS — M7062 Trochanteric bursitis, left hip: Secondary | ICD-10-CM | POA: Diagnosis not present

## 2022-09-11 DIAGNOSIS — M7061 Trochanteric bursitis, right hip: Secondary | ICD-10-CM | POA: Diagnosis not present

## 2022-09-15 DIAGNOSIS — M7062 Trochanteric bursitis, left hip: Secondary | ICD-10-CM | POA: Diagnosis not present

## 2022-09-15 DIAGNOSIS — M6281 Muscle weakness (generalized): Secondary | ICD-10-CM | POA: Diagnosis not present

## 2022-09-15 DIAGNOSIS — M7061 Trochanteric bursitis, right hip: Secondary | ICD-10-CM | POA: Diagnosis not present

## 2022-09-18 DIAGNOSIS — M7062 Trochanteric bursitis, left hip: Secondary | ICD-10-CM | POA: Diagnosis not present

## 2022-09-18 DIAGNOSIS — M7061 Trochanteric bursitis, right hip: Secondary | ICD-10-CM | POA: Diagnosis not present

## 2022-09-18 DIAGNOSIS — M6281 Muscle weakness (generalized): Secondary | ICD-10-CM | POA: Diagnosis not present

## 2022-09-22 DIAGNOSIS — M7062 Trochanteric bursitis, left hip: Secondary | ICD-10-CM | POA: Diagnosis not present

## 2022-09-22 DIAGNOSIS — M7061 Trochanteric bursitis, right hip: Secondary | ICD-10-CM | POA: Diagnosis not present

## 2022-09-22 DIAGNOSIS — M6281 Muscle weakness (generalized): Secondary | ICD-10-CM | POA: Diagnosis not present

## 2022-09-23 ENCOUNTER — Other Ambulatory Visit: Payer: Self-pay | Admitting: Cardiovascular Disease

## 2022-09-25 DIAGNOSIS — M7062 Trochanteric bursitis, left hip: Secondary | ICD-10-CM | POA: Diagnosis not present

## 2022-09-25 DIAGNOSIS — M6281 Muscle weakness (generalized): Secondary | ICD-10-CM | POA: Diagnosis not present

## 2022-09-25 DIAGNOSIS — M7061 Trochanteric bursitis, right hip: Secondary | ICD-10-CM | POA: Diagnosis not present

## 2022-09-27 DIAGNOSIS — J449 Chronic obstructive pulmonary disease, unspecified: Secondary | ICD-10-CM | POA: Diagnosis not present

## 2022-09-27 DIAGNOSIS — E785 Hyperlipidemia, unspecified: Secondary | ICD-10-CM | POA: Diagnosis not present

## 2022-09-27 DIAGNOSIS — I10 Essential (primary) hypertension: Secondary | ICD-10-CM | POA: Diagnosis not present

## 2022-09-27 DIAGNOSIS — I25118 Atherosclerotic heart disease of native coronary artery with other forms of angina pectoris: Secondary | ICD-10-CM | POA: Diagnosis not present

## 2022-09-27 DIAGNOSIS — E1121 Type 2 diabetes mellitus with diabetic nephropathy: Secondary | ICD-10-CM | POA: Diagnosis not present

## 2022-09-27 DIAGNOSIS — N182 Chronic kidney disease, stage 2 (mild): Secondary | ICD-10-CM | POA: Diagnosis not present

## 2022-09-30 ENCOUNTER — Other Ambulatory Visit: Payer: Self-pay | Admitting: Cardiovascular Disease

## 2022-10-02 ENCOUNTER — Other Ambulatory Visit (HOSPITAL_BASED_OUTPATIENT_CLINIC_OR_DEPARTMENT_OTHER): Payer: Self-pay

## 2022-11-27 ENCOUNTER — Other Ambulatory Visit (HOSPITAL_BASED_OUTPATIENT_CLINIC_OR_DEPARTMENT_OTHER): Payer: Self-pay

## 2022-12-01 ENCOUNTER — Ambulatory Visit: Payer: Medicare Other | Attending: Cardiovascular Disease | Admitting: Cardiovascular Disease

## 2022-12-01 ENCOUNTER — Encounter: Payer: Self-pay | Admitting: Cardiovascular Disease

## 2022-12-01 VITALS — BP 142/86 | HR 71 | Ht 67.0 in | Wt 251.4 lb

## 2022-12-01 DIAGNOSIS — G4733 Obstructive sleep apnea (adult) (pediatric): Secondary | ICD-10-CM | POA: Diagnosis not present

## 2022-12-01 DIAGNOSIS — I252 Old myocardial infarction: Secondary | ICD-10-CM | POA: Diagnosis not present

## 2022-12-01 DIAGNOSIS — I10 Essential (primary) hypertension: Secondary | ICD-10-CM | POA: Diagnosis not present

## 2022-12-01 DIAGNOSIS — E785 Hyperlipidemia, unspecified: Secondary | ICD-10-CM | POA: Insufficient documentation

## 2022-12-01 NOTE — Patient Instructions (Signed)

## 2022-12-01 NOTE — Assessment & Plan Note (Signed)
History of obstructive sleep apnea on BiPAP

## 2022-12-01 NOTE — Assessment & Plan Note (Signed)
History of essential hypertension blood pressure measured today 142/86.  She is on metoprolol.

## 2022-12-01 NOTE — Assessment & Plan Note (Signed)
History of morbid obesity status post bariatric surgery performed with Dr. Toney Rakes 12/28/2019.  She is also on Ozempic.  She lost over 300 pounds but gained some of that back.  Her weight has been flat over the last year and around 250 pounds.

## 2022-12-01 NOTE — Assessment & Plan Note (Signed)
History of inferior STEMI treated with PCI and drug-eluting stenting of the RCA by myself 01/17/2014.  Remainder of her anatomy was free of disease.  LV function was preserved.  She is active and denies any symptoms.  She remains on aspirin.

## 2022-12-01 NOTE — Progress Notes (Signed)
12/01/2022 LYNDSAY TALAMANTE   22-Mar-1951  [id number]  Primary Physician Jonathon Jordan, MD Primary Cardiologist: [name] Lupe Carney, [place]  HPI:  Mariah Kelley is a 72 y.o.  w/ PMHx s/f HTN, HLD, prior tobacco abuse, obesity and family h/o premature CAD. I last saw her in the office 11/08/2021.  She was  admitted to [facility] today for STEMI 01/17/14.  She was in her USOH until ~2AM this morning when she experienced sudden onset substernal chest pressure radiating to her neck and arms bilaterally w/ associated nausea and vomiting. The discomfort persisted through the morning. She took an ASA '81mg'$  ~0600 w/ minimal relief. When the discomfort intensified to a 7-8/10, she sought medical care.  She has no prior cardiac history. She reports smoking 1 PPD x 40 years, no current tobacco abuse. Mother with MI in her 102s, father with MI at 17, brothers w/ CAD. Previously on medication for high cholesterol. No current medications.   In the ED, EKG revealed NSR w/ 57m ST elevations in II, III, aVF and V5, V6 meeting diagnostic criteria for an inferolateral STEMI. Code STEMI was called, and she was transported emergently to the cardiac cath lab. I placed a drug-eluting stent in her dominant mid RCA for a hazy 80% plaque. The remainder of her coronary anatomy was free of significant disease in her function was preserved. She was discharged in stable condition. Since that time she has seen BSan [place] PA-C in the office in followup. She does deny chest pain or shortness of breath. She has lost 40 pounds as made lifestyle modifications.since I saw her back in the office a year ago she's remained clinically stable specifically denying chest pain or shortness of breath.  She was diagnosed with breast cancer underwent mastectomy one year ago and chemotherapy subsequent to that. She is currently cancer free. She exercises 4 times a week but unfortunately continues to gain  weight. She had her left shoulder replaced approximately 1 year ago.    She [facility] successful [facility]  She dropped 308 down to 221 pounds and feels clinically improved.  Unfortunately, over the last year she has gained back 50 pounds.  She did have a left total knee replacement by Dr. LLorre Nick12/20.     Since I saw her a year ago she continues to do well.  Her weight has remained stable.  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  She is completely asymptomatic.   Current Meds  Medication Sig   albuterol (VENTOLIN HFA) 108 (90 Base) MCG/ACT inhaler Inhale 2 puffs into the lungs every 6 (six) hours as needed for wheezing or shortness of breath.   aspirin 81 MG chewable tablet Chew by mouth daily.   atorvastatin (LIPITOR) 80 MG tablet TAKE 1 TABLET BY MOUTH DAILY AT 6 PM.   calcium citrate-vitamin D 500-500 MG-UNIT chewable tablet Chew 1 tablet by mouth 3 (three) times daily.   inclisiran (LEQVIO) 284 MG/1.5ML SOSY injection Inject 284 mg into the skin every 6 (six) months.   metoprolol tartrate (LOPRESSOR) 50 MG tablet TAKE 1 TABLET BY MOUTH TWICE A DAY   nitroGLYCERIN (NITROSTAT) 0.4 MG SL tablet PLACE 1 TABLET UNDER THE TONGUE EVERY 5 MINUTES AS NEEDED FOR CHEST PAIN.   Semaglutide, 2 MG/DOSE, (OZEMPIC, 2 MG/DOSE,) 8 MG/3ML SOPN Inject '2mg'$   under the skin once weekly.     Allergies  Allergen Reactions   Prednisone Other (See Comments)    "made poison ivy worse", rebound effect when take prednisone   Codeine Nausea Only and Other (See Comments)    HEADACHE    Social History   Socioeconomic History   Marital status: Single    Spouse name: Not on file   Number of children: Not on file   Years of education: Not on file   Highest education level: Not on file  Occupational History   Occupation: Education officer, museum  Tobacco Use   Smoking status: Former    Packs/day:  1.00    Years: 40.00    Total pack years: 40.00    Types: Cigarettes    Quit date: 07/30/2009    Years since quitting: 13.3   Smokeless tobacco: Former   Tobacco comments:    quit 5 yrs ago  Vaping Use   Vaping Use: Never used  Substance and Sexual Activity   Alcohol use: No   Drug use: No   Sexual activity: Not Currently    Birth control/protection: Surgical  Other Topics Concern   Not on file  Social History Narrative   Divorced, 2 children.    Social Determinants of Health   Financial Resource Strain: Not on file  Food Insecurity: Not on file  Transportation Needs: Not on file  Physical Activity: Not on file  Stress: Not on file  Social Connections: Not on file  Intimate Partner Violence: Not on file     Review of Systems: General: negative for chills, fever, night sweats or weight changes.  Cardiovascular: negative for chest pain, dyspnea on exertion, edema, orthopnea, palpitations, paroxysmal nocturnal dyspnea or shortness of breath Dermatological: negative for rash Respiratory: negative for cough or wheezing Urologic: negative for hematuria Abdominal: negative for nausea, vomiting, diarrhea, bright red blood per rectum, melena, or hematemesis Neurologic: negative for visual changes, syncope, or dizziness All other systems reviewed and are otherwise negative except as noted above.    Blood pressure (!) 142/86, pulse 71, height '5\' 7"'$  (1.702 m), weight 251 lb 6.4 oz (114 kg), SpO2 99 %.  General appearance: alert and no distress Neck: no adenopathy, no carotid bruit, no JVD, supple, symmetrical, trachea midline, and thyroid not enlarged, symmetric, no tenderness/mass/nodules Lungs: clear to auscultation bilaterally Heart: regular rate and rhythm, S1, S2 normal, no murmur, click, rub or gallop Extremities: extremities normal, atraumatic, no cyanosis or edema Pulses: 2+ and symmetric Skin: Skin color, texture, turgor normal. No rashes or lesions Neurologic: Grossly  normal  EKG sinus rhythm at 71 with LVH voltage  Reviewed this EKG.  ASSESSMENT AND PLAN:   History of ST elevation myocardial infarction (STEMI) History of inferior STEMI treated with PCI and drug-eluting stenting of the RCA by myself 01/17/2014.  Remainder of her anatomy was free of disease.  LV function was preserved.  She is active and denies any symptoms.  She remains on aspirin.  Dyslipidemia History of dyslipidemia on atorvastatin and inclisiran with lipid profile performed 07/26/2022 revealing total cholesterol 124, LDL 43 and HDL 59.  Essential hypertension History of essential hypertension blood pressure measured today 142/86.  She is on metoprolol.  Morbid obesity ([place]) History of morbid obesity status post bariatric surgery performed with Dr. Toney Rakes 12/28/2019.  She is also on Ozempic.  She lost over 300 pounds but gained some of that back.  Her weight has been flat over the last year and around 250 pounds.  OSA treated with BiPAP History of obstructive sleep apnea on BiPAP     [name] [facility], [facility] 12/01/2022 9:40 AM

## 2022-12-01 NOTE — Assessment & Plan Note (Signed)
History of dyslipidemia on atorvastatin and inclisiran with lipid profile performed 07/26/2022 revealing total cholesterol 124, LDL 43 and HDL 59.

## 2022-12-28 ENCOUNTER — Other Ambulatory Visit (HOSPITAL_BASED_OUTPATIENT_CLINIC_OR_DEPARTMENT_OTHER): Payer: Self-pay

## 2023-02-09 ENCOUNTER — Ambulatory Visit (HOSPITAL_COMMUNITY)
Admission: RE | Admit: 2023-02-09 | Discharge: 2023-02-09 | Disposition: A | Discharge status: Home / Self Care | Payer: Medicare Other | Source: Ambulatory Visit | Attending: Cardiovascular Disease | Admitting: Cardiovascular Disease

## 2023-02-09 DIAGNOSIS — E785 Hyperlipidemia, unspecified: Secondary | ICD-10-CM | POA: Insufficient documentation

## 2023-02-09 MED ORDER — INCLISIRAN SODIUM 284 MG/1.5ML ~~LOC~~ SOSY: 284.0000 mg | PREFILLED_SYRINGE | Freq: Once | SUBCUTANEOUS | Status: AC

## 2023-02-09 MED ORDER — INCLISIRAN SODIUM 284 MG/1.5ML ~~LOC~~ SOSY
PREFILLED_SYRINGE | SUBCUTANEOUS | Status: AC
  Administered 2023-02-09: 284 mg via SUBCUTANEOUS
  Filled 2023-02-09: qty 1.5

## 2023-02-19 DIAGNOSIS — Z1231 Encounter for screening mammogram for malignant neoplasm of breast: Secondary | ICD-10-CM | POA: Diagnosis not present

## 2023-03-25 ENCOUNTER — Other Ambulatory Visit: Payer: Self-pay | Admitting: Cardiovascular Disease

## 2023-03-28 ENCOUNTER — Other Ambulatory Visit: Payer: Self-pay | Admitting: Cardiovascular Disease

## 2023-04-12 DIAGNOSIS — Z86010 Personal history of colonic polyps: Secondary | ICD-10-CM | POA: Diagnosis not present

## 2023-04-12 DIAGNOSIS — E782 Mixed hyperlipidemia: Secondary | ICD-10-CM | POA: Diagnosis not present

## 2023-04-12 DIAGNOSIS — Z1211 Encounter for screening for malignant neoplasm of colon: Secondary | ICD-10-CM | POA: Diagnosis not present

## 2023-04-12 DIAGNOSIS — I10 Essential (primary) hypertension: Secondary | ICD-10-CM | POA: Diagnosis not present

## 2023-05-15 ENCOUNTER — Ambulatory Visit: Payer: Medicare Other | Attending: Student | Admitting: Student

## 2023-05-15 ENCOUNTER — Telehealth: Payer: Self-pay | Admitting: *Deleted

## 2023-05-15 DIAGNOSIS — Z01810 Encounter for preprocedural cardiovascular examination: Secondary | ICD-10-CM | POA: Diagnosis not present

## 2023-05-15 NOTE — Telephone Encounter (Signed)
Patient is returning call.  °

## 2023-05-15 NOTE — Telephone Encounter (Signed)
   Name: Mariah Kelley  DOB: 10-Dec-1950  MRN: [id number]  Primary Cardiologist: Nanetta Batty, MD   Preoperative team, please contact this patient and set up a phone call appointment for further preoperative risk assessment. Please obtain consent and complete medication review. Thank you for your help.  I confirm that guidance regarding antiplatelet and oral anticoagulation therapy has been completed and, if necessary, noted below.  Ideally aspirin should be continued without interruption, however if the bleeding risk is too great, aspirin may be held for 5-7 days prior to surgery. Please resume aspirin post operatively when it is felt to be safe from a bleeding standpoint.     Carlos Levering, NP 05/15/2023, 8:35 AM [facility]

## 2023-05-15 NOTE — Telephone Encounter (Signed)
   Pre-operative Risk Assessment    Patient Name: Mariah Kelley  DOB: 1951-01-24 MRN: [id number]      Request for Surgical Clearance    Procedure:   CO,.ONOSCOPY  Date of Surgery:  Clearance 05/21/23                                 Surgeon:  DR. MANN Surgeon's Group or Practice Name:  [facility] Phone number:  [phone] Fax number:  [phone]   Type of Clearance Requested:   - Medical  - Pharmacy:  Hold Aspirin NOT INDICATED HOW LONG   Type of Anesthesia:   PROPOFOL   Additional requests/questions:    Wilhemina Cash   05/15/2023, 7:18 AM

## 2023-05-15 NOTE — Telephone Encounter (Signed)
I s/w the pt and she is agreeable to plan of care for tele pre op appt today at 2:40. See earlier notes from today.   Med rec and consent are done.     Patient Consent for Virtual Visit        Mariah Kelley has provided verbal consent on 05/15/2023 for a virtual visit (video or telephone).   CONSENT FOR VIRTUAL VISIT FOR:  Mariah Kelley  By participating in this virtual visit I agree to the following:  I hereby voluntarily request, consent and authorize [facility] and its employed or contracted physicians, physician assistants, nurse practitioners or other licensed health care professionals (the Practitioner), to provide me with telemedicine health care services (the "Services") as deemed necessary by the treating Practitioner. I acknowledge and consent to receive the Services by the Practitioner via telemedicine. I understand that the telemedicine visit will involve communicating with the Practitioner through live audiovisual communication technology and the disclosure of certain medical information by electronic transmission. I acknowledge that I have been given the opportunity to request an in-person assessment or other available alternative prior to the telemedicine visit and am voluntarily participating in the telemedicine visit.  I understand that I have the right to withhold or withdraw my consent to the use of telemedicine in the course of my care at any time, without affecting my right to future care or treatment, and that the Practitioner or I may terminate the telemedicine visit at any time. I understand that I have the right to inspect all information obtained and/or recorded in the course of the telemedicine visit and may receive copies of available information for a reasonable fee.  I understand that some of the potential risks of receiving the Services via telemedicine include:  Delay or interruption in medical evaluation due to technological equipment  failure or disruption; Information transmitted may not be sufficient (e.g. poor resolution of images) to allow for appropriate medical decision making by the Practitioner; and/or  In rare instances, security protocols could fail, causing a breach of personal health information.  Furthermore, I acknowledge that it is my responsibility to provide information about my medical history, conditions and care that is complete and accurate to the best of my ability. I acknowledge that Practitioner's advice, recommendations, and/or decision may be based on factors not within their control, such as incomplete or inaccurate data provided by me or distortions of diagnostic images or specimens that may result from electronic transmissions. I understand that the practice of medicine is not an exact science and that Practitioner makes no warranties or guarantees regarding treatment outcomes. I acknowledge that a copy of this consent can be made available to me via my patient portal [facility] MyChart), or I can request a printed copy by calling the office of [facility].    I understand that my insurance will be billed for this visit.   I have read or had this consent read to me. I understand the contents of this consent, which adequately explains the benefits and risks of the Services being provided via telemedicine.  I have been provided ample opportunity to ask questions regarding this consent and the Services and have had my questions answered to my satisfaction. I give my informed consent for the services to be provided through the use of telemedicine in my medical care

## 2023-05-15 NOTE — Telephone Encounter (Signed)
Left message to call back to set up tele pre op. Pt will need to be added today or tomorrow due to procedure and possible med hold.   See recommendations from pre op APP:  Ideally aspirin should be continued without interruption, however if the bleeding risk is too great, aspirin may be held for 5-7 days prior to surgery. Please resume aspirin post operatively when it is felt to be safe from a bleeding standpoint.

## 2023-05-15 NOTE — Telephone Encounter (Signed)
I s/w the pt and she is agreeable to plan of care for tele pre op appt today at 2:40. See earlier notes from today.    Med rec and consent are done.

## 2023-05-15 NOTE — Progress Notes (Signed)
Virtual Visit via Telephone Note   Because of Mariah Kelley's co-morbid illnesses, she is at least at moderate risk for complications without adequate follow up.  This format is felt to be most appropriate for this patient at this time.  The patient did not have access to video technology/had technical difficulties with video requiring transitioning to audio format only (telephone).  All issues noted in this document were discussed and addressed.  No physical exam could be performed with this format.  Please refer to the patient's chart for her consent to telehealth for [facility].  Evaluation Performed:  Preoperative cardiovascular risk assessment _____________   Date:  05/15/2023   Patient ID:  Mariah Kelley, DOB [birth year], MRN [id number] Patient Location:  Home Provider location:   Office  Primary Care Provider:  Mila Palmer, MD Primary Cardiologist:  Nanetta Batty, MD  Chief Complaint / Patient Profile   72 y.o. y/o female with a h/o CAD s/p PCI with DES to mid RCA 2015, hypertension, hyperlipidemia, COPD, OSA on BiPAP, T2DM, breast CA s/p mastectomy and chemotherapy 2023, obesity s/p bariatric surgery 2020 who is pending colonoscopy by Dr. Loreta Ave and presents today for telephonic preoperative cardiovascular risk assessment.  History of Present Illness    Mariah Kelley is a 72 y.o. female who presents via audio/video conferencing for a telehealth visit today.  Pt was last seen in cardiology clinic on 12/01/2022 by Dr Allyson Sabal.  At that time Mariah Kelley was doing well.  The patient is now pending procedure as outlined above. Since her last visit, she is doing well from a cardiac standpoint. Patient denies shortness of breath or dyspnea on exertion. No chest pain, pressure, or tightness. Denies lower extremity edema, orthopnea, or PND. No palpitations. Patient is very active swimming daily and walking her dogs. The walk with her dogs varies in  time depending on the weather.   Past Medical History    Past Medical History:  Diagnosis Date   Arthritis    Asthma    Breast cancer of upper-outer quadrant of right female breast (HCC) 12/23/2014   Bruises easily    CAD S/P percutaneous coronary angioplasty 01/17/2014   takes Brilinta and Plavix daily   Cataracts, bilateral    Complication of anesthesia    COPD (chronic obstructive pulmonary disease) (HCC)    Diabetes mellitus without complication (HCC)    pre DM per pt. now after weight loss surgery   no meds   History of bronchitis 7-33yrs ago   History of tobacco abuse    40 pack-years   Hyperlipidemia    takes Lipitor daily   Hypertension    takes Metoprolol daily   Joint pain    Obesity    OSA treated with BiPAP    moderate with AHI 19/hr now on BIPAP auto   PONV (postoperative nausea and vomiting)    [place]   ST elevation myocardial infarction (STEMI) of inferolateral wall (HCC) 01/17/2014   Past Surgical History:  Procedure Laterality Date   ABDOMINAL HYSTERECTOMY     APPENDECTOMY     BREAST REDUCTION SURGERY Left 01/27/2015   BREAST REDUCTION SURGERY Left 01/27/2015   Procedure: MAMMARY REDUCTION  LEFT (BREAST);  Surgeon: Glenna Fellows, MD;  Location: [facility] OR;  Service: Plastics;  Laterality: Left;   CATARACT EXTRACTION W/PHACO Right 04/11/2016   Procedure: CATARACT EXTRACTION PHACO AND INTRAOCULAR LENS PLACEMENT (IOC);  Surgeon: Jethro Bolus, MD;  Location: [facility] ORS;  Service: Ophthalmology;  Laterality: Right;  CDE: 14.15   CATARACT EXTRACTION W/PHACO Left 05/02/2016   Procedure: CATARACT EXTRACTION PHACO AND INTRAOCULAR LENS PLACEMENT (IOC);  Surgeon: Jethro Bolus, MD;  Location: [facility] ORS;  Service: Ophthalmology;  Laterality: Left;  CDE: 12.11   CHOLECYSTECTOMY     COLONOSCOPY     EYE SURGERY     LEFT HEART CATH Bilateral 01/17/2014   Procedure: LEFT HEART CATH;  Surgeon: Runell Gess, MD;  Location: [facility] CATH LAB;  Service: Cardiovascular;  Laterality: Bilateral;    MASTECTOMY W/ SENTINEL NODE BIOPSY Right 01/27/2015   dr Ezzard Standing    MASTECTOMY W/ SENTINEL NODE BIOPSY Right 01/27/2015   Procedure: RIGHT MASTECTOMY WITH RIGHT AXILLARY  SENTINEL LYMPH NODE BIOPSY;  Surgeon: Ovidio Kin, MD;  Location: [facility] OR;  Service: General;  Laterality: Right;   PERCUTANEOUS CORONARY STENT INTERVENTION (PCI-S)     PORTACATH PLACEMENT Left 03/23/2015   Procedure: INSERTION PORT-A-CATH ;  Surgeon: Ovidio Kin, MD;  Location: [facility] ORS;  Service: General;  Laterality: Left;   sleeve gastrectomy     SPINAL FUSION     TOTAL KNEE ARTHROPLASTY Left 10/13/2019   Procedure: TOTAL KNEE ARTHROPLASTY;  Surgeon: Dannielle Huh, MD;  Location: [facility] ORS;  Service: Orthopedics;  Laterality: Left;  75 mins needed for length of case.   TOTAL SHOULDER ARTHROPLASTY Left 04/19/2017   Procedure: TOTAL SHOULDER ARTHROPLASTY;  Surgeon: Jones Broom, MD;  [facility] OR;  Service: Orthopedics;  Laterality: Left;  Left total shoulder arthroplasty   TRIGGER FINGER RELEASE Right 10/13/2021   Procedure: RELEASE TRIGGER FINGER/A-1 PULLEY RIGHT MIDDLE FINGER;  Surgeon: Cindee Salt, MD;  Location: [facility];  Service: Orthopedics;  Laterality: Right;   TUBAL LIGATION      Allergies  Allergies  Allergen Reactions   Prednisone Other (See Comments)    "made poison ivy worse", rebound effect when take prednisone   Codeine Nausea Only and Other (See Comments)    HEADACHE    Home Medications    Prior to Admission medications   Medication Sig Start Date End Date Taking? Authorizing Provider  albuterol (VENTOLIN HFA) 108 (90 Base) MCG/ACT inhaler Inhale 2 puffs into the lungs every 6 (six) hours as needed for wheezing or shortness of breath. 01/16/20   Coralyn Helling, MD  aspirin 81 MG chewable tablet Chew by mouth daily.    [provider]  atorvastatin (LIPITOR) 80 MG tablet TAKE 1 TABLET BY MOUTH DAILY AT 6 PM. 03/28/23   Runell Gess, MD  calcium citrate-vitamin D 500-500  MG-UNIT chewable tablet Chew 1 tablet by mouth 3 (three) times daily.    [provider]  inclisiran (LEQVIO) 284 MG/1.5ML SOSY injection Inject 284 mg into the skin every 6 (six) months.    [provider]  metoprolol tartrate (LOPRESSOR) 50 MG tablet TAKE 1 TABLET BY MOUTH TWICE A DAY 03/26/23   Runell Gess, MD  nitroGLYCERIN (NITROSTAT) 0.4 MG SL tablet PLACE 1 TABLET UNDER THE TONGUE EVERY 5 MINUTES AS NEEDED FOR CHEST PAIN. Patient not taking: Reported on 05/15/2023 06/10/20   Runell Gess, MD  Semaglutide, 2 MG/DOSE, (OZEMPIC, 2 MG/DOSE,) 8 MG/3ML SOPN Inject 2mg  under the skin once weekly. 08/04/22       Physical Exam    Vital Signs:  Mariah Kelley does not have vital signs available for review today.  Given telephonic nature of communication, physical exam is limited. AAOx3. NAD. Normal affect.  Speech and respirations are unlabored.  Accessory Clinical Findings  None  Assessment & Plan    Primary Cardiologist: Nanetta Batty, MD  Preoperative cardiovascular risk assessment. Colonoscopy with Dr. Loreta Ave.  Chart reviewed as part of pre-operative protocol coverage. According to the RCRI, patient has a 0.9% risk of MACE. Patient reports activity equivalent to >4.0 METS (walks dogs and swims daily).   Given past medical history and time since last visit, based on ACC/AHA guidelines, Mariah Kelley would be at acceptable risk for the planned procedure without further cardiovascular testing.   Patient was advised that if she develops new symptoms prior to surgery to contact our office to arrange a follow-up appointment.  she verbalized understanding.  Ideally aspirin should be continued without interruption, however if the bleeding risk is too great, aspirin may be held for 5-7 days prior to surgery. Please resume aspirin post operatively when it is felt to be safe from a bleeding standpoint.    I will route this recommendation to the requesting  party via Epic fax function.  Please call with questions.  Time:   Today, I have spent 5 minutes with the patient with telehealth technology discussing medical history, symptoms, and management plan.     Carlos Levering, NP  05/15/2023, 11:34 AM

## 2023-05-21 DIAGNOSIS — Z1211 Encounter for screening for malignant neoplasm of colon: Secondary | ICD-10-CM | POA: Diagnosis not present

## 2023-05-21 DIAGNOSIS — Z86010 Personal history of colonic polyps: Secondary | ICD-10-CM | POA: Diagnosis not present

## 2023-05-21 DIAGNOSIS — K648 Other hemorrhoids: Secondary | ICD-10-CM | POA: Diagnosis not present

## 2023-05-21 DIAGNOSIS — K5730 Diverticulosis of large intestine without perforation or abscess without bleeding: Secondary | ICD-10-CM | POA: Diagnosis not present

## 2023-05-21 DIAGNOSIS — D122 Benign neoplasm of ascending colon: Secondary | ICD-10-CM | POA: Diagnosis not present

## 2023-05-21 DIAGNOSIS — K635 Polyp of colon: Secondary | ICD-10-CM | POA: Diagnosis not present

## 2023-06-27 ENCOUNTER — Other Ambulatory Visit: Payer: Self-pay | Admitting: Cardiovascular Disease

## 2023-07-13 ENCOUNTER — Encounter: Payer: Self-pay | Admitting: Pharmacist

## 2023-07-14 ENCOUNTER — Other Ambulatory Visit: Payer: Self-pay

## 2023-07-14 ENCOUNTER — Emergency Department (HOSPITAL_COMMUNITY): Payer: Medicare Other

## 2023-07-14 ENCOUNTER — Encounter (HOSPITAL_COMMUNITY): Payer: Self-pay

## 2023-07-14 ENCOUNTER — Emergency Department (HOSPITAL_COMMUNITY)
Admission: EM | Admit: 2023-07-14 | Discharge: 2023-07-14 | Disposition: A | Discharge status: Home / Self Care | Payer: Medicare Other | Attending: Emergency Medicine | Admitting: Emergency Medicine

## 2023-07-14 DIAGNOSIS — Z794 Long term (current) use of insulin: Secondary | ICD-10-CM | POA: Diagnosis not present

## 2023-07-14 DIAGNOSIS — Z7982 Long term (current) use of aspirin: Secondary | ICD-10-CM | POA: Insufficient documentation

## 2023-07-14 DIAGNOSIS — I10 Essential (primary) hypertension: Secondary | ICD-10-CM | POA: Diagnosis not present

## 2023-07-14 DIAGNOSIS — N23 Unspecified renal colic: Secondary | ICD-10-CM | POA: Diagnosis not present

## 2023-07-14 DIAGNOSIS — N1330 Unspecified hydronephrosis: Secondary | ICD-10-CM | POA: Diagnosis not present

## 2023-07-14 DIAGNOSIS — R109 Unspecified abdominal pain: Secondary | ICD-10-CM | POA: Diagnosis present

## 2023-07-14 LAB — CBC WITH DIFFERENTIAL/PLATELET
Abs Immature Granulocytes: 0.04 10*3/uL (ref 0.00–0.07)
Basophils Absolute: 0 10*3/uL (ref 0.0–0.1)
Basophils Relative: 0 %
Eosinophils Absolute: 0.1 10*3/uL (ref 0.0–0.5)
Eosinophils Relative: 1 %
HCT: 36.7 % (ref 36.0–46.0)
Hemoglobin: 11.7 g/dL — ABNORMAL LOW (ref 12.0–15.0)
Immature Granulocytes: 0 %
Lymphocytes Relative: 9 %
Lymphs Abs: 0.9 10*3/uL (ref 0.7–4.0)
MCH: 27.8 pg (ref 26.0–34.0)
MCHC: 31.9 g/dL (ref 30.0–36.0)
MCV: 87.2 fL (ref 80.0–100.0)
Monocytes Absolute: 0.7 10*3/uL (ref 0.1–1.0)
Monocytes Relative: 7 %
Neutro Abs: 8.2 10*3/uL — ABNORMAL HIGH (ref 1.7–7.7)
Neutrophils Relative %: 83 %
Platelets: 273 10*3/uL (ref 150–400)
RBC: 4.21 MIL/uL (ref 3.87–5.11)
RDW: 12.7 % (ref 11.5–15.5)
WBC: 9.8 10*3/uL (ref 4.0–10.5)
nRBC: 0 % (ref 0.0–0.2)

## 2023-07-14 LAB — I-STAT CHEM 8, ED
BUN: 16 mg/dL (ref 8–23)
Calcium, Ion: 1.06 mmol/L — ABNORMAL LOW (ref 1.15–1.40)
Chloride: 103 mmol/L (ref 98–111)
Creatinine, Ser: 0.8 mg/dL (ref 0.44–1.00)
Glucose, Bld: 155 mg/dL — ABNORMAL HIGH (ref 70–99)
HCT: 36 % (ref 36.0–46.0)
Hemoglobin: 12.2 g/dL (ref 12.0–15.0)
Potassium: 8 mmol/L (ref 3.5–5.1)
Sodium: 135 mmol/L (ref 135–145)
TCO2: 27 mmol/L (ref 22–32)

## 2023-07-14 LAB — COMPREHENSIVE METABOLIC PANEL
ALT: 27 U/L (ref 0–44)
AST: 44 U/L — ABNORMAL HIGH (ref 15–41)
Albumin: 3.6 g/dL (ref 3.5–5.0)
Alkaline Phosphatase: 98 U/L (ref 38–126)
Anion gap: 12 (ref 5–15)
BUN: 12 mg/dL (ref 8–23)
CO2: 20 mmol/L — ABNORMAL LOW (ref 22–32)
Calcium: 8.7 mg/dL — ABNORMAL LOW (ref 8.9–10.3)
Chloride: 106 mmol/L (ref 98–111)
Creatinine, Ser: 0.85 mg/dL (ref 0.44–1.00)
GFR, Estimated: >60 mL/min (ref >60)
Glucose, Bld: 150 mg/dL — ABNORMAL HIGH (ref 70–99)
Potassium: 4.6 mmol/L (ref 3.5–5.1)
Sodium: 138 mmol/L (ref 135–145)
Total Bilirubin: 1.8 mg/dL — ABNORMAL HIGH (ref 0.3–1.2)
Total Protein: 6.1 g/dL — ABNORMAL LOW (ref 6.5–8.1)

## 2023-07-14 LAB — URINALYSIS, W/ REFLEX TO CULTURE (INFECTION SUSPECTED)
Bacteria, UA: NONE SEEN
Bilirubin Urine: NEGATIVE
Glucose, UA: NEGATIVE mg/dL
Ketones, ur: NEGATIVE mg/dL
Leukocytes,Ua: NEGATIVE
Nitrite: NEGATIVE
Protein, ur: NEGATIVE mg/dL
Specific Gravity, Urine: 1.011 (ref 1.005–1.030)
pH: 6 (ref 5.0–8.0)

## 2023-07-14 LAB — I-STAT CG4 LACTIC ACID, ED
Lactic Acid, Venous: 1.3 mmol/L (ref 0.5–1.9)
Lactic Acid, Venous: 1 mmol/L (ref 0.5–1.9)

## 2023-07-14 LAB — LIPASE, BLOOD: Lipase: 34 U/L (ref 11–51)

## 2023-07-14 MED ORDER — SODIUM CHLORIDE 0.9 % IV BOLUS
500.0000 mL | Freq: Once | INTRAVENOUS | Status: AC
  Administered 2023-07-14: 500 mL via INTRAVENOUS

## 2023-07-14 MED ORDER — DOCUSATE SODIUM 100 MG PO CAPS: 100.0000 mg | ORAL_CAPSULE | Freq: Two times a day (BID) | ORAL | 0 refills | Status: DC

## 2023-07-14 MED ORDER — MORPHINE SULFATE (PF) 4 MG/ML IV SOLN
4.0000 mg | Freq: Once | INTRAVENOUS | Status: AC
  Administered 2023-07-14: 4 mg via INTRAVENOUS
  Filled 2023-07-14: qty 1

## 2023-07-14 MED ORDER — ONDANSETRON HCL 4 MG/2ML IJ SOLN
4.0000 mg | Freq: Once | INTRAMUSCULAR | Status: AC
  Administered 2023-07-14: 4 mg via INTRAVENOUS
  Filled 2023-07-14: qty 2

## 2023-07-14 MED ORDER — HYDROCODONE-ACETAMINOPHEN 5-325 MG PO TABS: 2.0000 | ORAL_TABLET | ORAL | 0 refills | Status: DC | PRN

## 2023-07-14 MED ORDER — ONDANSETRON HCL 4 MG PO TABS
4.0000 mg | ORAL_TABLET | Freq: Four times a day (QID) | ORAL | 0 refills | Status: DC
Start: 2023-07-14 — End: 2023-07-16

## 2023-07-14 MED ORDER — IOHEXOL 350 MG/ML SOLN
75.0000 mL | Freq: Once | INTRAVENOUS | Status: AC | PRN
  Administered 2023-07-14: 75 mL via INTRAVENOUS

## 2023-07-14 NOTE — ED Triage Notes (Addendum)
Pt arrived POV from home c/o lower back pain for a couple weeks now but then now she has not been able to have a bowel movement. Her last bowel movement was Tuesday. Pt is also starting to feel nauseous.   Pt is right arm restricted due to previous mastectomy related to breast cancer on the right side.

## 2023-07-14 NOTE — ED Provider Notes (Signed)
EMERGENCY DEPARTMENT AT [facility] Provider Note   CSN: [id number] Arrival date & time: 07/14/23  4098     History  Chief Complaint  Patient presents with   Back Pain    Mariah Kelley is a 72 y.o. female.  72 year old female with prior medical history as detailed below presents for right sided flank pain.  Patient reports that this been ongoing issue for the last several days.  She reports some mild associated nausea.  She denies any nausea or vomiting.  She denies a history of renal colic.    The history is provided by the patient and medical records.       Home Medications Prior to Admission medications   Medication Sig Start Date End Date Taking? Authorizing Provider  docusate sodium (COLACE) 100 MG capsule Take 1 capsule (100 mg total) by mouth every 12 (twelve) hours. 07/14/23  Yes Wynetta Fines, MD  HYDROcodone-acetaminophen (NORCO/VICODIN) 5-325 MG tablet Take 2 tablets by mouth every 4 (four) hours as needed. 07/14/23  Yes Wynetta Fines, MD  ondansetron (ZOFRAN) 4 MG tablet Take 1 tablet (4 mg total) by mouth every 6 (six) hours. 07/14/23  Yes Wynetta Fines, MD  albuterol (VENTOLIN HFA) 108 (90 Base) MCG/ACT inhaler Inhale 2 puffs into the lungs every 6 (six) hours as needed for wheezing or shortness of breath. 01/16/20   Coralyn Helling, MD  aspirin 81 MG chewable tablet Chew by mouth daily.    [provider]  atorvastatin (LIPITOR) 80 MG tablet TAKE 1 TABLET BY MOUTH DAILY AT 6 PM. 06/27/23   Runell Gess, MD  calcium citrate-vitamin D 500-500 MG-UNIT chewable tablet Chew 1 tablet by mouth 3 (three) times daily.    [provider]  inclisiran (LEQVIO) 284 MG/1.5ML SOSY injection Inject 284 mg into the skin every 6 (six) months.    [provider]  metoprolol tartrate (LOPRESSOR) 50 MG tablet TAKE 1 TABLET BY MOUTH TWICE A DAY 06/27/23   Runell Gess, MD  nitroGLYCERIN (NITROSTAT) 0.4 MG SL tablet PLACE  1 TABLET UNDER THE TONGUE EVERY 5 MINUTES AS NEEDED FOR CHEST PAIN. Patient not taking: Reported on 05/15/2023 06/10/20   Runell Gess, MD  Semaglutide, 2 MG/DOSE, (OZEMPIC, 2 MG/DOSE,) 8 MG/3ML SOPN Inject 2mg  under the skin once weekly. 08/04/22         Allergies    Prednisone and Codeine    Review of Systems   Review of Systems  All other systems reviewed and are negative.   Physical Exam Updated Vital Signs BP (!) 170/67   Pulse 80   Temp 97.8 F (36.6 C) (Oral)   Resp 17   Ht 5\' 7"  (1.702 m)   Wt 113.4 kg   SpO2 98%   BMI 39.16 kg/m  Physical Exam Vitals and nursing note reviewed.  Constitutional:      General: She is not in acute distress.    Appearance: Normal appearance. She is well-developed.  HENT:     Head: Normocephalic and atraumatic.  Eyes:     Conjunctiva/sclera: Conjunctivae normal.     Pupils: Pupils are equal, round, and reactive to light.  Cardiovascular:     Rate and Rhythm: Normal rate and regular rhythm.     Heart sounds: Normal heart sounds.  Pulmonary:     Effort: Pulmonary effort is normal. No respiratory distress.     Breath sounds: Normal breath sounds.  Abdominal:     General: There  is no distension.     Palpations: Abdomen is soft.     Tenderness: There is no abdominal tenderness.  Musculoskeletal:        General: No deformity. Normal range of motion.     Cervical back: Normal range of motion and neck supple.  Skin:    General: Skin is warm and dry.  Neurological:     General: No focal deficit present.     Mental Status: She is alert and oriented to person, place, and time.     ED Results / Procedures / Treatments   Labs (all labs ordered are listed, but only abnormal results are displayed) Labs Reviewed  COMPREHENSIVE METABOLIC PANEL - Abnormal; Notable for the following components:      Result Value   CO2 20 (*)    Glucose, Bld 150 (*)    Calcium 8.7 (*)    Total Protein 6.1 (*)    AST 44 (*)    Total Bilirubin 1.8 (*)     All other components within normal limits  URINALYSIS, W/ REFLEX TO CULTURE (INFECTION SUSPECTED) - Abnormal; Notable for the following components:   Hgb urine dipstick SMALL (*)    All other components within normal limits  CBC WITH DIFFERENTIAL/PLATELET - Abnormal; Notable for the following components:   Hemoglobin 11.7 (*)    Neutro Abs 8.2 (*)    All other components within normal limits  I-STAT CHEM 8, ED - Abnormal; Notable for the following components:   Potassium 8.0 (*)    Glucose, Bld 155 (*)    Calcium, Ion 1.06 (*)    All other components within normal limits  LIPASE, BLOOD  CBC WITH DIFFERENTIAL/PLATELET  I-STAT CG4 LACTIC ACID, ED  I-STAT CG4 LACTIC ACID, ED    EKG EKG Interpretation Date/Time:  Saturday July 14 2023 11:31:39 EDT Ventricular Rate:  74 PR Interval:  164 QRS Duration:  85 QT Interval:  402 QTC Calculation: 446 R Axis:   49  Text Interpretation: Sinus rhythm Borderline T wave abnormalities Confirmed by Kristine Royal [phone]) on 07/14/2023 11:46:56 AM  Radiology CT ABDOMEN PELVIS W CONTRAST  Result Date: 07/14/2023 CLINICAL DATA:  Acute lower back pain for several weeks. EXAM: CT ABDOMEN AND PELVIS WITH CONTRAST TECHNIQUE: Multidetector CT imaging of the abdomen and pelvis was performed using the standard protocol following bolus administration of intravenous contrast. RADIATION DOSE REDUCTION: This exam was performed according to the departmental dose-optimization program which includes automated exposure control, adjustment of the mA and/or kV according to patient size and/or use of iterative reconstruction technique. CONTRAST:  75mL OMNIPAQUE IOHEXOL 350 MG/ML SOLN COMPARISON:  None Available. FINDINGS: Lower chest: No acute abnormality. Hepatobiliary: No focal liver abnormality is seen. Status post cholecystectomy. No biliary dilatation. Pancreas: Unremarkable. No pancreatic ductal dilatation or surrounding inflammatory changes. Spleen: Normal in  size without focal abnormality. Adrenals/Urinary Tract: Adrenal glands appear normal. Moderate right hydronephrosis is noted secondary to 9 mm proximal right ureteral calculus. Urinary bladder is unremarkable. Stomach/Bowel: Status post gastric bypass. There is no evidence of bowel obstruction or inflammation. Status post appendectomy. Vascular/Lymphatic: Aortic atherosclerosis. No enlarged abdominal or pelvic lymph nodes. Reproductive: Status post hysterectomy. No adnexal masses. Other: No abdominal wall hernia or abnormality. No abdominopelvic ascites. Musculoskeletal: No acute or significant osseous findings. IMPRESSION: Moderate right hydronephrosis is noted secondary to 9 mm proximal right ureteral calculus. Aortic Atherosclerosis (ICD10-I70.0). Electronically Signed   By: Lupita Raider M.D.   On: 07/14/2023 13:59    Procedures  Procedures    Medications Ordered in ED Medications  ondansetron (ZOFRAN) injection 4 mg (4 mg Intravenous Given 07/14/23 1125)  morphine (PF) 4 MG/ML injection 4 mg (4 mg Intravenous Given 07/14/23 1129)  sodium chloride 0.9 % bolus 500 mL (500 mLs Intravenous New Bag/Given 07/14/23 1125)  iohexol (OMNIPAQUE) 350 MG/ML injection 75 mL (75 mLs Intravenous Contrast Given 07/14/23 1213)    ED Course/ Medical Decision Making/ A&P                                 Medical Decision Making Amount and/or Complexity of Data Reviewed Labs: ordered. Radiology: ordered.  Risk OTC drugs. Prescription drug management.    Medical Screen Complete  This patient presented to the ED with complaint of right-sided flank pain.  This complaint involves an extensive number of treatment options. The initial differential diagnosis includes, but is not limited to, metabolic abnormality, renal colic, appendicitis, etc.  This presentation is: Acute, Chronic, Self-Limited, Previously Undiagnosed, Uncertain Prognosis, Complicated, Systemic Symptoms, and Threat to Life/Bodily  Function  Patient is presenting with complaint of right sided abdominal pain.  CT imaging demonstrates right sided renal stone with obstructive pathology.  Patient's pain is well-controlled during ED evaluation.  Patient understands need for close outpatient follow-up with urology.  Urology service Joyce Gross MD) made aware of case and will help arrange outpatient follow-up in the next several days.  Strict return precautions given and understood.  Importance of close follow-up is stressed.  Additional history obtained: External records from outside sources obtained and reviewed including prior ED visits and prior Inpatient records.    Lab Tests:  I ordered and personally interpreted labs.  The pertinent results include: CBC, CMP, i-STAT Chem-8, UA, lipase   Imaging Studies ordered:  I ordered imaging studies including CT abdomen pelvis I independently visualized and interpreted obtained imaging which showed right sided ureteral stone I agree with the radiologist interpretation.   Cardiac Monitoring:  The patient was maintained on a cardiac monitor.  I personally viewed and interpreted the cardiac monitor which showed an underlying rhythm of: NSR  Problem List / ED Course:  Renal colic   Reevaluation:  After the interventions noted above, I reevaluated the patient and found that they have: improved   Disposition:  After consideration of the diagnostic results and the patients response to treatment, I feel that the patent would benefit from close outpatient follow-up.          Final Clinical Impression(s) / ED Diagnoses Final diagnoses:  Renal colic    Rx / DC Orders ED Discharge Orders          Ordered    ondansetron (ZOFRAN) 4 MG tablet  Every 6 hours        07/14/23 1425    docusate sodium (COLACE) 100 MG capsule  Every 12 hours        07/14/23 1425    HYDROcodone-acetaminophen (NORCO/VICODIN) 5-325 MG tablet  Every 4 hours PRN        07/14/23 1425               Wynetta Fines, MD 07/14/23 1614

## 2023-07-14 NOTE — ED Notes (Signed)
Pt verbalized understanding of discharge instructions . Pt wheeled from ed and transferred self to car. Pt IV removed.

## 2023-07-14 NOTE — Discharge Instructions (Addendum)
Return for any problem.  ?

## 2023-07-15 DIAGNOSIS — N23 Unspecified renal colic: Secondary | ICD-10-CM | POA: Diagnosis not present

## 2023-07-16 ENCOUNTER — Other Ambulatory Visit: Payer: Self-pay | Admitting: Urology

## 2023-07-16 ENCOUNTER — Other Ambulatory Visit: Payer: Self-pay | Admitting: Pharmacist

## 2023-07-16 DIAGNOSIS — N132 Hydronephrosis with renal and ureteral calculous obstruction: Secondary | ICD-10-CM | POA: Diagnosis not present

## 2023-07-16 NOTE — Addendum Note (Signed)
Addended by: Malena Peer D on: 07/16/2023 12:21 PM   Modules accepted: Orders

## 2023-07-17 NOTE — Patient Instructions (Signed)
SURGICAL WAITING ROOM VISITATION Patients having surgery or a procedure may have no more than 2 support people in the waiting area - these visitors may rotate.    Children under the age of 39 must have an adult with them who is not the patient.  If the patient needs to stay at the hospital during part of their recovery, the visitor guidelines for inpatient rooms apply. Pre-op nurse will coordinate an appropriate time for 1 support person to accompany patient in pre-op.  This support person may not rotate.    Please refer to the [facility] website for the visitor guidelines for Inpatients (after your surgery is over and you are in a regular room).       Your procedure is scheduled on: 07-24-23   Report to [facility] Main Entrance    Report to admitting at 9:45 AM   Call this number if you have problems the morning of surgery [phone]   Do not eat food or drink liquids:After Midnight.           If you have questions, please contact your surgeon's office.  FOLLOW ANY ADDITIONAL PRE OP INSTRUCTIONS YOU RECEIVED FROM YOUR SURGEON'S OFFICE!!!   Oral Hygiene is also important to reduce your risk of infection.                                    Remember - BRUSH YOUR TEETH THE MORNING OF SURGERY WITH YOUR REGULAR TOOTHPASTE   Do NOT smoke after Midnight   Take these medicines the morning of surgery with A SIP OF WATER:   Metoprolol  If needed Tylenol.  Stop all vitamins and herbal supplements 7 days before surgery  Bring CPAP mask and tubing day of surgery.                              You may not have any metal on your body including hair pins, jewelry, and body piercing             Do not wear make-up, lotions, powders, perfumes or deodorant  Do not wear nail polish including gel and S&S, artificial/acrylic nails, or any other type of covering on natural nails including finger and toenails. If you have artificial nails, gel coating, etc. that needs to be removed by a  nail salon please have this removed prior to surgery or surgery may need to be canceled/ delayed if the surgeon/ anesthesia feels like they are unable to be safely monitored.   Do not shave  48 hours prior to surgery.    Do not bring valuables to the hospital. [facility] IS NOT RESPONSIBLE   FOR VALUABLES.   Contacts, dentures or bridgework may not be worn into surgery.  DO NOT BRING YOUR HOME MEDICATIONS TO THE HOSPITAL. PHARMACY WILL DISPENSE MEDICATIONS LISTED ON YOUR MEDICATION LIST TO YOU DURING YOUR ADMISSION IN THE HOSPITAL!    Patients discharged on the day of surgery will not be allowed to drive home.  Someone NEEDS to stay with you for the first 24 hours after anesthesia.   Special Instructions: Bring a copy of your healthcare power of attorney and living will documents the day of surgery if you haven't scanned them before.              Please read over the following fact sheets you were  given: IF YOU HAVE QUESTIONS ABOUT YOUR PRE-OP INSTRUCTIONS PLEASE CALL [phone]  If you received a COVID test during your pre-op visit  it is requested that you wear a mask when out in public, stay away from anyone that may not be feeling well and notify your surgeon if you develop symptoms. If you test positive for Covid or have been in contact with anyone that has tested positive in the last 10 days please notify you surgeon.  [facility] - Preparing for Surgery Before surgery, you can play an important role.  Because skin is not sterile, your skin needs to be as free of germs as possible.  You can reduce the number of germs on your skin by washing with CHG (chlorahexidine gluconate) soap before surgery.  CHG is an antiseptic cleaner which kills germs and bonds with the skin to continue killing germs even after washing. Please DO NOT use if you have an allergy to CHG or antibacterial soaps.  If your skin becomes reddened/irritated stop using the CHG and inform your nurse when you arrive at  Short Stay. Do not shave (including legs and underarms) for at least 48 hours prior to the first CHG shower.  You may shave your face/neck.  Please follow these instructions carefully:  1.  Shower with CHG Soap the night before surgery and the  morning of surgery.  2.  If you choose to wash your hair, wash your hair first as usual with your normal  shampoo.  3.  After you shampoo, rinse your hair and body thoroughly to remove the shampoo.                             4.  Use CHG as you would any other liquid soap.  You can apply chg directly to the skin and wash.  Gently with a scrungie or clean washcloth.  5.  Apply the CHG Soap to your body ONLY FROM THE NECK DOWN.   Do   not use on face/ open                           Wound or open sores. Avoid contact with eyes, ears mouth and   genitals (private parts).                       Wash face,  Genitals (private parts) with your normal soap.             6.  Wash thoroughly, paying special attention to the area where your    surgery  will be performed.  7.  Thoroughly rinse your body with warm water from the neck down.  8.  DO NOT shower/wash with your normal soap after using and rinsing off the CHG Soap.                9.  Pat yourself dry with a clean towel.            10.  Wear clean pajamas.            11.  Place clean sheets on your bed the night of your first shower and do not  sleep with pets. Day of Surgery : Do not apply any lotions/deodorants the morning of surgery.  Please wear clean clothes to the hospital/surgery center.  FAILURE TO FOLLOW THESE INSTRUCTIONS MAY RESULT IN THE CANCELLATION OF YOUR  SURGERY  PATIENT SIGNATURE_________________________________  NURSE SIGNATURE__________________________________  ________________________________________________________________________

## 2023-07-18 ENCOUNTER — Encounter: Payer: Self-pay | Admitting: Physician Assistant

## 2023-07-18 ENCOUNTER — Encounter (HOSPITAL_COMMUNITY): Payer: Self-pay

## 2023-07-18 ENCOUNTER — Encounter (HOSPITAL_COMMUNITY)
Admission: RE | Admit: 2023-07-18 | Discharge: 2023-07-18 | Disposition: A | Discharge status: Home / Self Care | Payer: Medicare Other | Source: Ambulatory Visit | Attending: Urology | Admitting: Urology

## 2023-07-18 ENCOUNTER — Ambulatory Visit (INDEPENDENT_AMBULATORY_CARE_PROVIDER_SITE_OTHER): Payer: Medicare Other | Admitting: Physician Assistant

## 2023-07-18 ENCOUNTER — Telehealth: Payer: Self-pay | Admitting: Cardiovascular Disease

## 2023-07-18 ENCOUNTER — Other Ambulatory Visit: Payer: Self-pay

## 2023-07-18 VITALS — BP 210/108 | HR 86 | Ht 67.0 in | Wt 267.0 lb

## 2023-07-18 VITALS — BP 206/83 | HR 63 | Temp 97.6°F | Ht 67.0 in | Wt 265.0 lb

## 2023-07-18 DIAGNOSIS — E875 Hyperkalemia: Secondary | ICD-10-CM | POA: Diagnosis not present

## 2023-07-18 DIAGNOSIS — I2510 Atherosclerotic heart disease of native coronary artery without angina pectoris: Secondary | ICD-10-CM | POA: Insufficient documentation

## 2023-07-18 DIAGNOSIS — Z01818 Encounter for other preprocedural examination: Secondary | ICD-10-CM | POA: Insufficient documentation

## 2023-07-18 DIAGNOSIS — I10 Essential (primary) hypertension: Secondary | ICD-10-CM | POA: Insufficient documentation

## 2023-07-18 DIAGNOSIS — E119 Type 2 diabetes mellitus without complications: Secondary | ICD-10-CM | POA: Diagnosis not present

## 2023-07-18 DIAGNOSIS — E785 Hyperlipidemia, unspecified: Secondary | ICD-10-CM | POA: Insufficient documentation

## 2023-07-18 DIAGNOSIS — I160 Hypertensive urgency: Secondary | ICD-10-CM | POA: Insufficient documentation

## 2023-07-18 DIAGNOSIS — Z9861 Coronary angioplasty status: Secondary | ICD-10-CM | POA: Insufficient documentation

## 2023-07-18 DIAGNOSIS — R739 Hyperglycemia, unspecified: Secondary | ICD-10-CM | POA: Insufficient documentation

## 2023-07-18 DIAGNOSIS — Z79899 Other long term (current) drug therapy: Secondary | ICD-10-CM | POA: Diagnosis not present

## 2023-07-18 HISTORY — DX: Dyspnea, unspecified: R06.00

## 2023-07-18 LAB — GLUCOSE, CAPILLARY: Glucose-Capillary: 147 mg/dL — ABNORMAL HIGH (ref 70–99)

## 2023-07-18 MED ORDER — AMLODIPINE BESYLATE 5 MG PO TABS
5.0000 mg | ORAL_TABLET | Freq: Every day | ORAL | 3 refills | Status: DC
Start: 2023-07-18 — End: 2023-12-07

## 2023-07-18 NOTE — Progress Notes (Addendum)
For Short Stay: COVID SWAB appointment date:  Bowel Prep reminder:   For Anesthesia: PCP -Mila Palmer, MD   Cardiologist - Runell Gess, MD   Chest x-ray - CT Chest: 08/18/22 EKG - 07/16/23 Stress Test - 07/04/18: [facility] ECHO -  Cardiac Cath -  Pacemaker/ICD device last checked: Pacemaker orders received: Device Rep notified:  Spinal Cord Stimulator: N/A  Sleep Study - Yes CPAP - Yes  Fasting Blood Sugar - 100's Checks Blood Sugar __2___ times a week Date and result of last Hgb A1c-  Last dose of GLP1 agonist- N/A GLP1 instructions:   Last dose of SGLT-2 inhibitors- N/A SGLT-2 instructions:   Blood Thinner Instructions: Aspirin Instructions: NO instructions Last Dose:  Activity level: Can go up a flight of stairs and activities of daily living without stopping and without chest pain and/or shortness of breath   Able to exercise without chest pain and/or shortness of breath    Anesthesia review: Hx: HTN,CAD,OSA(CPAP),COPD,DM,MI. BP was elevated during PST appointment,(224/101,206/83),manually: 220/150. Shanda Bumps PA was notified,EKG was done and pt. Was advised to call cardiologist.  Patient denies shortness of breath, fever, cough and chest pain at PAT appointment   Patient verbalized understanding of instructions that were given to them at the PAT appointment. Patient was also instructed that they will need to review over the PAT instructions again at home before surgery.

## 2023-07-18 NOTE — Progress Notes (Signed)
Cardiology Office Note:  .   Date:  07/18/2023  ID:  Mariah Kelley, DOB [birth year], MRN [id number] PCP: Mila Palmer, MD  [facility] Providers Cardiologist:  Nanetta Batty, MD     History of Present Illness: Marland Kelley   Mariah Kelley is a 72 y.o. female with past medical history of hypertension, hyperlipidemia, OSA on CPAP prior tobacco abuse, obesity and CAD.  Patient was admitted to [facility] in March 2015 with STEMI.  Cardiac catheterization showed hazy 80% plaque in mid RCA treated with drug-eluting stent.  She was diagnosed with breast cancer and underwent mastectomy.  She underwent successful bariatric surgery in February 2020 at [facility] and had drastic weight loss since then.  She was last seen by Dr. Allyson Sabal in January 2024 at which time she was doing well.  Patient requires kidney stone removal.  She presented to the preadmission visit today and found to have systolic blood pressure of 220.  She was subsequently added to my schedule to be seen for hypertensive urgency.  She denies any dizziness, blurry vision, feeling of passing out, headache, or strokelike symptom such as ipsilateral weakness.  She denies any chest pain, shortness of breath or mid back pain.  She does have very mild pain in the lower back due to kidney stone.  This has not changed and is currently managed with pain medication.  Blood pressure in the office on manual recheck was 226/100.  I recommend the addition of amlodipine 5 mg daily.  Recent blood work also showed a potassium of 8, which is likely hemolyzed.  EKG from this morning has been reviewed, there was no EKG changes associated with hyperkalemia.  I suspect hemolyzed sample.  I will obtain basic metabolic panel to reassess potassium level.  After 1 week, if systolic blood pressures greater than 140 mmHg, patient has been instructed to increase amlodipine to 10 mg daily.  Ideally, her systolic blood pressure should be 160 or less before  she undergo urology procedure.  She can follow-up with hypertension clinic in 2 weeks for reassessment.  She is aware to monitor for signs of stroke such as ipsilateral weakness, slurring of speech, or facial drooping.  If she does have strokelike symptom, she need to seek urgent medical attention.  ROS:   She denies chest pain, palpitations, dyspnea, pnd, orthopnea, n, v, dizziness, syncope, edema, weight gain, or early satiety. All other systems reviewed and are otherwise negative except as noted above.    Studies Reviewed: .            Risk Assessment/Calculations:            Physical Exam:   VS:  BP (!) 210/108   Pulse 86   Ht 5\' 7"  (1.702 m)   Wt 267 lb (121.1 kg)   SpO2 94%   BMI 41.82 kg/m    Wt Readings from Last 3 Encounters:  07/18/23 267 lb (121.1 kg)  07/18/23 265 lb (120.2 kg)  07/14/23 250 lb (113.4 kg)    GEN: Well nourished, well developed in no acute distress NECK: No JVD; No carotid bruits CARDIAC: RRR, no murmurs, rubs, gallops RESPIRATORY:  Clear to auscultation without rales, wheezing or rhonchi  ABDOMEN: Soft, non-tender, non-distended EXTREMITIES:  No edema; No deformity   ASSESSMENT AND PLAN: .    Hypertensive urgency: Blood pressure elevated on repeat manual recheck.  Will add amlodipine 5 mg daily to his medical regimen.  If after 1 week systolic blood  pressure remain elevated greater than 140 mmHg, he has been instructed to increase amlodipine to 10 mg daily.  She is aware of signs and symptoms of stroke, if she does have any symptom, she has been instructed to seek urgent medical attention.  CAD: Denies any chest pain.  Continue aspirin  Hyperlipidemia: On Lipitor  Hyperkalemia: Likely hemolyzed on the recent blood work, potassium 8.0, will repeat basic metabolic panel.       Dispo: Follow-up with hypertension clinic in 2 weeks.  Signed, Azalee Course, PA

## 2023-07-18 NOTE — Telephone Encounter (Signed)
Patient states she was at [facility] long today for pre admit and her BP was elevated. They took her BP 4 times and all were over 200's/100/s. The 5th time was manual and it was 220/100.  She is asymptomatic. Spoke with DOD and he stated she needs an appointment to be seen.  Dr. Allyson Sabal stated it was ok to put her in your 335.

## 2023-07-18 NOTE — Patient Instructions (Signed)
Medication Instructions:  START AMLODIPINE 5 MG DAILY. *If you need a refill on your cardiac medications before your next appointment, please call your pharmacy*   Lab Work: BMP TODAY If you have labs (blood work) drawn today and your tests are completely normal, you will receive your results only by: MyChart Message (if you have MyChart) OR A paper copy in the mail If you have any lab test that is abnormal or we need to change your treatment, we will call you to review the results.   Testing/Procedures: NO TESTING   Follow-Up: At [facility], you and your health needs are our priority.  As part of our continuing mission to provide you with exceptional heart care, we have created designated Provider Care Teams.  These Care Teams include your primary Cardiologist (physician) and Advanced Practice Providers (APPs -  Physician Assistants and Nurse Practitioners) who all work together to provide you with the care you need, when you need it.  We recommend signing up for the patient portal called "MyChart".  Sign up information is provided on this After Visit Summary.  MyChart is used to connect with patients for Virtual Visits (Telemedicine).  Patients are able to view lab/test results, encounter notes, upcoming appointments, etc.  Non-urgent messages can be sent to your provider as well.   To learn more about what you can do with MyChart, go to [email or web address].    Your next appointment:   1-2 WEEKS   Provider:   [facility]

## 2023-07-18 NOTE — Telephone Encounter (Signed)
Pt c/o BP issue: STAT if pt c/o blurred vision, one-sided weakness or slurred speech  1. What are your last 5 BP readings?  9/11: 220/100  2. Are you having any other symptoms (ex. Dizziness, headache, blurred vision, passed out)?  No   3. What is your BP issue?  BP is elevated.

## 2023-07-19 LAB — HEMOGLOBIN A1C
Hgb A1c MFr Bld: 7.1 % — ABNORMAL HIGH (ref 4.8–5.6)
Mean Plasma Glucose: 157 mg/dL

## 2023-07-19 LAB — BASIC METABOLIC PANEL
BUN/Creatinine Ratio: 16 (ref 12–28)
BUN: 16 mg/dL (ref 8–27)
CO2: 25 mmol/L (ref 20–29)
Calcium: 9.5 mg/dL (ref 8.7–10.3)
Chloride: 107 mmol/L — ABNORMAL HIGH (ref 96–106)
Creatinine, Ser: 1 mg/dL (ref 0.57–1.00)
Glucose: 104 mg/dL — ABNORMAL HIGH (ref 70–99)
Potassium: 5.1 mmol/L (ref 3.5–5.2)
Sodium: 146 mmol/L — ABNORMAL HIGH (ref 134–144)
eGFR: 59 mL/min/{1.73_m2} — ABNORMAL LOW (ref >59)

## 2023-07-19 NOTE — Progress Notes (Signed)
Received a call back from pt.,to let RN know ,that the cardiologist started her on amlodipine 5 mg. Daily.Pt. was advised to take amlodipine the day of surgery,and to continue monitoring BP at home.

## 2023-07-20 ENCOUNTER — Telehealth: Payer: Self-pay

## 2023-07-20 NOTE — Progress Notes (Signed)
Anesthesia Chart Review   Case: [id number] Date/Time: 07/24/23 1145   Procedure: RIGHT URETEROSCOPY/HOLMIUM LASER/STENT PLACEMENT (Right)   Anesthesia type: General   Pre-op diagnosis: RIGHT RENAL STONE   Location: [facility]   Surgeons: Despina Arias, MD       DISCUSSION:72 y.o. former smoker with h/o PONV, HTN, OSA, COPD, CAD (STEMI March 2015), DM II, right renal stone scheduled for above procedure 07/24/2023 with Dr. [name] Sermon.   Per cardiology preoperative evaluation 05/15/2023, "Chart reviewed as part of pre-operative protocol coverage. According to the RCRI, patient has a 0.9% risk of MACE. Patient reports activity equivalent to >4.0 METS (walks dogs and swims daily).  Given past medical history and time since last visit, based on ACC/AHA guidelines, TYSHAY CAPANNA would be at acceptable risk for the planned procedure without further cardiovascular testing.  Patient was advised that if she develops new symptoms prior to surgery to contact our office to arrange a follow-up appointment.  she verbalized understanding. Ideally aspirin should be continued without interruption, however if the bleeding risk is too great, aspirin may be held for 5-7 days prior to surgery. Please resume aspirin post operatively when it is felt to be safe from a bleeding standpoint. "  Elevated BP at PAT visit, advised to follow up with cardiology. Pt seen by cardiology 07/18/2023. Per OV note amlodipine 5mg  added, advised to increased amlodipine 10 10mg  daily is pressures remain elevated.  Pt asymptomatic. Evaluate DOS.   VS: BP (!) 206/83   Pulse 63   Temp 36.4 C (Oral)   Ht 5\' 7"  (1.702 m)   Wt 120.2 kg   SpO2 97%   BMI 41.50 kg/m   PROVIDERS: Mila Palmer, MD is PCP   Primary Cardiologist:  Nanetta Batty, MD  LABS: Labs reviewed: Acceptable for surgery. (all labs ordered are listed, but only abnormal results are displayed)  Labs Reviewed  HEMOGLOBIN A1C - Abnormal;  Notable for the following components:      Result Value   Hgb A1c MFr Bld 7.1 (*)    All other components within normal limits  GLUCOSE, CAPILLARY - Abnormal; Notable for the following components:   Glucose-Capillary 147 (*)    All other components within normal limits     IMAGES:   EKG:   CV: Echo 07/04/2018 SUMMARY  The patient had no chest pain.  The patient achieved 114 % of maximum predicted heart rate.  The METS achieved was 6.7.  Exercise capacity was average.  Negative stress ECG for inducible ischemia at target heart rate.  Negative exercise echocardiography for inducible ischemia at target heart  rate.  LV contractility is not vigrous at peak stress but no focal wall motion  apparent -  Past Medical History:  Diagnosis Date   Arthritis    Asthma    Breast cancer of upper-outer quadrant of right female breast (HCC) 12/23/2014   Bruises easily    CAD S/P percutaneous coronary angioplasty 01/17/2014   takes Brilinta and Plavix daily   Cataracts, bilateral    Complication of anesthesia    COPD (chronic obstructive pulmonary disease) (HCC)    Diabetes mellitus without complication (HCC)    pre DM per pt. now after weight loss surgery   no meds   Dyspnea    History of bronchitis 7-3yrs ago   History of tobacco abuse    40 pack-years   Hyperlipidemia    takes Lipitor daily   Hypertension    takes  Metoprolol daily   Joint pain    Obesity    OSA treated with BiPAP    moderate with AHI 19/hr now on BIPAP auto   PONV (postoperative nausea and vomiting)    [place]   ST elevation myocardial infarction (STEMI) of inferolateral wall (HCC) 01/17/2014    Past Surgical History:  Procedure Laterality Date   ABDOMINAL HYSTERECTOMY     APPENDECTOMY     BREAST REDUCTION SURGERY Left 01/27/2015   BREAST REDUCTION SURGERY Left 01/27/2015   Procedure: MAMMARY REDUCTION  LEFT (BREAST);  Surgeon: Glenna Fellows, MD;  Location[facility];  Service: Plastics;  Laterality:  Left;   CATARACT EXTRACTION W/PHACO Right 04/11/2016   Procedure: CATARACT EXTRACTION PHACO AND INTRAOCULAR LENS PLACEMENT (IOC);  Surgeon: Jethro Bolus, MD;  Location: [facility];  Service: Ophthalmology;  Laterality: Right;  CDE: 14.15   CATARACT EXTRACTION W/PHACO Left 05/02/2016   Procedure: CATARACT EXTRACTION PHACO AND INTRAOCULAR LENS PLACEMENT (IOC);  Surgeon: Jethro Bolus, MD;  Location: [facility];  Service: Ophthalmology;  Laterality: Left;  CDE: 12.11   CHOLECYSTECTOMY     COLONOSCOPY     EYE SURGERY     LEFT HEART CATH Bilateral 01/17/2014   Procedure: LEFT HEART CATH;  Surgeon: Runell Gess, MD;  Location: [facility] CATH LAB;  Service: Cardiovascular;  Laterality: Bilateral;   MASTECTOMY W/ SENTINEL NODE BIOPSY Right 01/27/2015   dr Ezzard Standing    MASTECTOMY W/ SENTINEL NODE BIOPSY Right 01/27/2015   Procedure: RIGHT MASTECTOMY WITH RIGHT AXILLARY  SENTINEL LYMPH NODE BIOPSY;  Surgeon: Ovidio Kin, MD;  Location: [facility] OR;  Service: General;  Laterality: Right;   PERCUTANEOUS CORONARY STENT INTERVENTION (PCI-S)     PORTACATH PLACEMENT Left 03/23/2015   Procedure: INSERTION PORT-A-CATH ;  Surgeon: Ovidio Kin, MD;  Location: [facility];  Service: General;  Laterality: Left;   sleeve gastrectomy     SPINAL FUSION     TOTAL KNEE ARTHROPLASTY Left 10/13/2019   Procedure: TOTAL KNEE ARTHROPLASTY;  Surgeon: Dannielle Huh, MD;  Location: [facility];  Service: Orthopedics;  Laterality: Left;  75 mins needed for length of case.   TOTAL SHOULDER ARTHROPLASTY Left 04/19/2017   Procedure: TOTAL SHOULDER ARTHROPLASTY;  Surgeon: Jones Broom, MD;  Location: [facility] OR;  Service: Orthopedics;  Laterality: Left;  Left total shoulder arthroplasty   TRIGGER FINGER RELEASE Right 10/13/2021   Procedure: RELEASE TRIGGER FINGER/A-1 PULLEY RIGHT MIDDLE FINGER;  Surgeon: Cindee Salt, MD;  Location: [facility];  Service: Orthopedics;  Laterality: Right;   TUBAL LIGATION      MEDICATIONS:  acetaminophen (TYLENOL) 500 MG  tablet   amLODipine (NORVASC) 5 MG tablet   aspirin 81 MG chewable tablet   atorvastatin (LIPITOR) 80 MG tablet   Calcium Carbonate-Vitamin D (CALCIUM-D PO)   celecoxib (CELEBREX) 200 MG capsule   cholecalciferol (VITAMIN D3) 25 MCG (1000 UNIT) tablet   inclisiran (LEQVIO) 284 MG/1.5ML SOSY injection   metoprolol tartrate (LOPRESSOR) 50 MG tablet   nitroGLYCERIN (NITROSTAT) 0.4 MG SL tablet   oxyCODONE (OXY IR/ROXICODONE) 5 MG immediate release tablet   polyethylene glycol (MIRALAX / GLYCOLAX) 17 g packet   tamsulosin (FLOMAX) 0.4 MG CAPS capsule   No current facility-administered medications for this encounter.   Jodell Cipro Ward, PA-C [facility] [phone]

## 2023-07-20 NOTE — Telephone Encounter (Addendum)
Called patient in regards to results, patient had understanding of results.  ----- Message from Azalee Course sent at 07/20/2023  8:11 AM EDT ----- Potassium is normal. Stable renal function.

## 2023-07-23 NOTE — Anesthesia Preprocedure Evaluation (Signed)
Anesthesia Evaluation  Patient identified by MRN, date of birth, ID band Patient awake    Reviewed: Allergy & Precautions, NPO status , Patient's Chart, lab work & pertinent test results  History of Anesthesia Complications (+) PONV and history of anesthetic complications  Airway Mallampati: II  TM Distance: >3 FB Neck ROM: Full    Dental  (+) Dental Advisory Given   Pulmonary asthma , sleep apnea and Continuous Positive Airway Pressure Ventilation , COPD, former smoker   Pulmonary exam normal        Cardiovascular hypertension, Pt. on home beta blockers and Pt. on medications + CAD and + Past MI  Normal cardiovascular exam     Neuro/Psych negative neurological ROS  negative psych ROS   GI/Hepatic negative GI ROS, Neg liver ROS,,,  Endo/Other  diabetes, Type 2  Morbid obesity  Renal/GU negative Renal ROS     Musculoskeletal  (+) Arthritis ,    Abdominal   Peds  Hematology  (+) Blood dyscrasia, anemia   Anesthesia Other Findings   Reproductive/Obstetrics  Breast cancer                              Anesthesia Physical Anesthesia Plan  ASA: 3  Anesthesia Plan: General   Post-op Pain Management: Tylenol PO (pre-op)*   Induction: Intravenous  PONV Risk Score and Plan: 4 or greater and Treatment may vary due to age or medical condition, Ondansetron and Dexamethasone  Airway Management Planned: LMA  Additional Equipment: None  Intra-op Plan:   Post-operative Plan: Extubation in OR  Informed Consent: I have reviewed the patients History and Physical, chart, labs and discussed the procedure including the risks, benefits and alternatives for the proposed anesthesia with the patient or authorized representative who has indicated his/her understanding and acceptance.     Dental advisory given  Plan Discussed with: CRNA and Anesthesiologist  Anesthesia Plan Comments: (See PAT  note)       Anesthesia Quick Evaluation

## 2023-07-24 ENCOUNTER — Encounter (HOSPITAL_COMMUNITY): Payer: Self-pay | Admitting: Urology

## 2023-07-24 ENCOUNTER — Ambulatory Visit (HOSPITAL_COMMUNITY): Payer: Medicare Other | Admitting: Physician Assistant

## 2023-07-24 ENCOUNTER — Encounter (HOSPITAL_COMMUNITY)
Admission: RE | Disposition: A | Discharge status: Home / Self Care | Payer: Self-pay | Source: Ambulatory Visit | Attending: Urology

## 2023-07-24 ENCOUNTER — Ambulatory Visit (HOSPITAL_BASED_OUTPATIENT_CLINIC_OR_DEPARTMENT_OTHER): Payer: Medicare Other | Admitting: Anesthesiology

## 2023-07-24 ENCOUNTER — Ambulatory Visit (HOSPITAL_COMMUNITY): Payer: Medicare Other

## 2023-07-24 ENCOUNTER — Ambulatory Visit (HOSPITAL_COMMUNITY)
Admission: RE | Admit: 2023-07-24 | Discharge: 2023-07-24 | Disposition: A | Discharge status: Home / Self Care | Payer: Medicare Other | Source: Ambulatory Visit | Attending: Urology | Admitting: Urology

## 2023-07-24 DIAGNOSIS — G4733 Obstructive sleep apnea (adult) (pediatric): Secondary | ICD-10-CM | POA: Diagnosis not present

## 2023-07-24 DIAGNOSIS — Z955 Presence of coronary angioplasty implant and graft: Secondary | ICD-10-CM | POA: Insufficient documentation

## 2023-07-24 DIAGNOSIS — J4489 Other specified chronic obstructive pulmonary disease: Secondary | ICD-10-CM | POA: Diagnosis not present

## 2023-07-24 DIAGNOSIS — J449 Chronic obstructive pulmonary disease, unspecified: Secondary | ICD-10-CM | POA: Diagnosis not present

## 2023-07-24 DIAGNOSIS — Z8249 Family history of ischemic heart disease and other diseases of the circulatory system: Secondary | ICD-10-CM | POA: Diagnosis not present

## 2023-07-24 DIAGNOSIS — I10 Essential (primary) hypertension: Secondary | ICD-10-CM | POA: Diagnosis not present

## 2023-07-24 DIAGNOSIS — Z87891 Personal history of nicotine dependence: Secondary | ICD-10-CM | POA: Insufficient documentation

## 2023-07-24 DIAGNOSIS — E1136 Type 2 diabetes mellitus with diabetic cataract: Secondary | ICD-10-CM | POA: Diagnosis not present

## 2023-07-24 DIAGNOSIS — Z9049 Acquired absence of other specified parts of digestive tract: Secondary | ICD-10-CM | POA: Insufficient documentation

## 2023-07-24 DIAGNOSIS — Z96652 Presence of left artificial knee joint: Secondary | ICD-10-CM | POA: Insufficient documentation

## 2023-07-24 DIAGNOSIS — I252 Old myocardial infarction: Secondary | ICD-10-CM | POA: Insufficient documentation

## 2023-07-24 DIAGNOSIS — Z9884 Bariatric surgery status: Secondary | ICD-10-CM | POA: Insufficient documentation

## 2023-07-24 DIAGNOSIS — N201 Calculus of ureter: Secondary | ICD-10-CM | POA: Insufficient documentation

## 2023-07-24 DIAGNOSIS — E119 Type 2 diabetes mellitus without complications: Secondary | ICD-10-CM

## 2023-07-24 DIAGNOSIS — J439 Emphysema, unspecified: Secondary | ICD-10-CM | POA: Diagnosis not present

## 2023-07-24 DIAGNOSIS — I2510 Atherosclerotic heart disease of native coronary artery without angina pectoris: Secondary | ICD-10-CM

## 2023-07-24 DIAGNOSIS — Z9011 Acquired absence of right breast and nipple: Secondary | ICD-10-CM | POA: Insufficient documentation

## 2023-07-24 DIAGNOSIS — Z6841 Body Mass Index (BMI) 40.0 and over, adult: Secondary | ICD-10-CM | POA: Insufficient documentation

## 2023-07-24 DIAGNOSIS — Z90710 Acquired absence of both cervix and uterus: Secondary | ICD-10-CM | POA: Diagnosis not present

## 2023-07-24 HISTORY — PX: CYSTOSCOPY/URETEROSCOPY/HOLMIUM LASER/STENT PLACEMENT: SHX6546

## 2023-07-24 LAB — GLUCOSE, CAPILLARY
Glucose-Capillary: 124 mg/dL — ABNORMAL HIGH (ref 70–99)
Glucose-Capillary: 137 mg/dL — ABNORMAL HIGH (ref 70–99)
Glucose-Capillary: 126 mg/dL — ABNORMAL HIGH (ref 70–99)

## 2023-07-24 SURGERY — CYSTOSCOPY/URETEROSCOPY/HOLMIUM LASER/STENT PLACEMENT
Anesthesia: General | Laterality: Right

## 2023-07-24 MED ORDER — LACTATED RINGERS IV SOLN: INTRAVENOUS | Status: DC

## 2023-07-24 MED ORDER — CELECOXIB 200 MG PO CAPS: 200.0000 mg | ORAL_CAPSULE | Freq: Two times a day (BID) | ORAL | 0 refills | Status: DC

## 2023-07-24 MED ORDER — MIDAZOLAM HCL 2 MG/2ML IJ SOLN
INTRAMUSCULAR | Status: AC
  Filled 2023-07-24: qty 2

## 2023-07-24 MED ORDER — FENTANYL CITRATE (PF) 100 MCG/2ML IJ SOLN
INTRAMUSCULAR | Status: AC
  Filled 2023-07-24: qty 2

## 2023-07-24 MED ORDER — SODIUM CHLORIDE 0.9 % IR SOLN
Status: DC | PRN
  Administered 2023-07-24: 3000 mL

## 2023-07-24 MED ORDER — IOHEXOL 300 MG/ML  SOLN
INTRAMUSCULAR | Status: DC | PRN
  Administered 2023-07-24: 10 mL

## 2023-07-24 MED ORDER — PHENYLEPHRINE 80 MCG/ML (10ML) SYRINGE FOR IV PUSH (FOR BLOOD PRESSURE SUPPORT)
PREFILLED_SYRINGE | INTRAVENOUS | Status: AC
  Filled 2023-07-24: qty 10

## 2023-07-24 MED ORDER — PHENYLEPHRINE 80 MCG/ML (10ML) SYRINGE FOR IV PUSH (FOR BLOOD PRESSURE SUPPORT)
PREFILLED_SYRINGE | INTRAVENOUS | Status: DC | PRN
Start: 2023-07-24 — End: 2023-07-24
  Administered 2023-07-24: 80 ug via INTRAVENOUS
  Administered 2023-07-24: 120 ug via INTRAVENOUS
  Administered 2023-07-24 (×2): 40 ug via INTRAVENOUS
  Administered 2023-07-24: 80 ug via INTRAVENOUS
  Administered 2023-07-24 (×2): 40 ug via INTRAVENOUS

## 2023-07-24 MED ORDER — ACETAMINOPHEN 500 MG PO TABS
1000.0000 mg | ORAL_TABLET | Freq: Once | ORAL | Status: AC
  Administered 2023-07-24: 1000 mg via ORAL
  Filled 2023-07-24: qty 2

## 2023-07-24 MED ORDER — SULFAMETHOXAZOLE-TRIMETHOPRIM 800-160 MG PO TABS: 1.0000 | ORAL_TABLET | Freq: Two times a day (BID) | ORAL | 0 refills | Status: DC

## 2023-07-24 MED ORDER — FENTANYL CITRATE (PF) 100 MCG/2ML IJ SOLN
INTRAMUSCULAR | Status: DC | PRN
  Administered 2023-07-24 (×4): 25 ug via INTRAVENOUS

## 2023-07-24 MED ORDER — MIDAZOLAM HCL 5 MG/5ML IJ SOLN
INTRAMUSCULAR | Status: DC | PRN
  Administered 2023-07-24: 2 mg via INTRAVENOUS

## 2023-07-24 MED ORDER — HYOSCYAMINE SULFATE 0.125 MG SL SUBL: 0.1250 mg | SUBLINGUAL_TABLET | Freq: Four times a day (QID) | SUBLINGUAL | 0 refills | Status: DC | PRN

## 2023-07-24 MED ORDER — CHLORHEXIDINE GLUCONATE 0.12 % MT SOLN
15.0000 mL | Freq: Once | OROMUCOSAL | Status: AC
  Administered 2023-07-24: 15 mL via OROMUCOSAL

## 2023-07-24 MED ORDER — DEXAMETHASONE SODIUM PHOSPHATE 10 MG/ML IJ SOLN
INTRAMUSCULAR | Status: AC
  Filled 2023-07-24: qty 1

## 2023-07-24 MED ORDER — INSULIN ASPART 100 UNIT/ML IJ SOLN: 0.0000 [IU] | INTRAMUSCULAR | Status: DC | PRN

## 2023-07-24 MED ORDER — LIDOCAINE HCL (CARDIAC) PF 100 MG/5ML IV SOSY
PREFILLED_SYRINGE | INTRAVENOUS | Status: DC | PRN
  Administered 2023-07-24: 60 mg via INTRAVENOUS

## 2023-07-24 MED ORDER — ORAL CARE MOUTH RINSE: 15.0000 mL | Freq: Once | OROMUCOSAL | Status: AC

## 2023-07-24 MED ORDER — ONDANSETRON HCL 4 MG/2ML IJ SOLN
INTRAMUSCULAR | Status: AC
  Filled 2023-07-24: qty 2

## 2023-07-24 MED ORDER — OXYCODONE HCL 5 MG PO TABS: 5.0000 mg | ORAL_TABLET | Freq: Four times a day (QID) | ORAL | 0 refills | Status: DC | PRN

## 2023-07-24 MED ORDER — PROPOFOL 10 MG/ML IV BOLUS
INTRAVENOUS | Status: AC
  Filled 2023-07-24: qty 20

## 2023-07-24 MED ORDER — PROPOFOL 10 MG/ML IV BOLUS
INTRAVENOUS | Status: DC | PRN
  Administered 2023-07-24: 140 mg via INTRAVENOUS

## 2023-07-24 MED ORDER — DEXAMETHASONE SODIUM PHOSPHATE 10 MG/ML IJ SOLN
INTRAMUSCULAR | Status: DC | PRN
Start: 2023-07-24 — End: 2023-07-24
  Administered 2023-07-24: 8 mg via INTRAVENOUS

## 2023-07-24 MED ORDER — EPHEDRINE 5 MG/ML INJ
INTRAVENOUS | Status: AC
  Filled 2023-07-24: qty 5

## 2023-07-24 MED ORDER — ONDANSETRON HCL 4 MG/2ML IJ SOLN
INTRAMUSCULAR | Status: DC | PRN
  Administered 2023-07-24: 4 mg via INTRAVENOUS

## 2023-07-24 MED ORDER — EPHEDRINE SULFATE-NACL 50-0.9 MG/10ML-% IV SOSY
PREFILLED_SYRINGE | INTRAVENOUS | Status: DC | PRN
Start: 2023-07-24 — End: 2023-07-24
  Administered 2023-07-24 (×4): 5 mg via INTRAVENOUS

## 2023-07-24 MED ORDER — LIDOCAINE HCL (PF) 2 % IJ SOLN
INTRAMUSCULAR | Status: AC
  Filled 2023-07-24: qty 5

## 2023-07-24 MED ORDER — CIPROFLOXACIN IN D5W 400 MG/200ML IV SOLN
400.0000 mg | INTRAVENOUS | Status: AC
  Administered 2023-07-24: 400 mg via INTRAVENOUS
  Filled 2023-07-24: qty 200

## 2023-07-24 SURGICAL SUPPLY — 24 items
BAG COUNTER SPONGE SURGICOUNT (BAG) IMPLANT
BAG SPNG CNTER NS LX DISP (BAG)
BAG URO CATCHER STRL LF (MISCELLANEOUS) ×1 IMPLANT
BASKET ZERO TIP NITINOL 2.4FR (BASKET) IMPLANT
BSKT STON RTRVL ZERO TP 2.4FR (BASKET)
CATH URETERAL DUAL LUMEN 10F (MISCELLANEOUS) ×1 IMPLANT
CATH URETL OPEN END 6FR 70 (CATHETERS) IMPLANT
CLOTH BEACON ORANGE TIMEOUT ST (SAFETY) ×1 IMPLANT
GLOVE SS BIOGEL STRL SZ 7 (GLOVE) ×1 IMPLANT
GLOVE SURG LX STRL 7.5 STRW (GLOVE) ×1 IMPLANT
GOWN STRL REUS W/ TWL XL LVL3 (GOWN DISPOSABLE) ×1 IMPLANT
GOWN STRL REUS W/TWL XL LVL3 (GOWN DISPOSABLE) ×1
GUIDEWIRE STR DUAL SENSOR (WIRE) ×1 IMPLANT
GUIDEWIRE ZIPWRE .038 STRAIGHT (WIRE) ×1 IMPLANT
KIT TURNOVER KIT A (KITS) IMPLANT
LASER FIB FLEXIVA PULSE ID 365 (Laser) IMPLANT
MANIFOLD NEPTUNE II (INSTRUMENTS) ×1 IMPLANT
PACK CYSTO (CUSTOM PROCEDURE TRAY) ×1 IMPLANT
SHEATH NAVIGATOR HD 11/13X36 (SHEATH) IMPLANT
STENT URET 6FRX24 CONTOUR (STENTS) IMPLANT
TRACTIP FLEXIVA PULS ID 200XHI (Laser) IMPLANT
TRACTIP FLEXIVA PULSE ID 200 (Laser) ×1
TUBING CONNECTING 10 (TUBING) ×1 IMPLANT
TUBING UROLOGY SET (TUBING) ×1 IMPLANT

## 2023-07-24 NOTE — Op Note (Signed)
Operative Note  Preoperative diagnosis:  1.  Right ureteral stone  Postoperative diagnosis: 1.  same  Procedure(s): 1.  Right ureteroscopy with laser lithotripsy and basket extraction of stones 2. Cystoscopy  3. Right retrograde pyelogram 4. Right ureteral stent placement 6x24 cm 5. Fluoroscopy with intraoperative interpretation  Surgeon: Irine Seal, MD  Assistants:  None  Anesthesia:  General  Complications:  None  EBL:  Minimal  Specimens: 1. None - stone dusted  Drains/Catheters: 1.  Right 6Fr x 24cm ureteral stent tether string  Intraoperative findings:   Cystoscopy demonstrated unremarkable bladder Right Ureteroscopy demonstrated stone in proximal ureter Successful stent placement.  Indication:  Mariah Kelley is a 72 y.o. female with a symptomatic right ureteral stone  Description of procedure: After informed consent was obtained from the patient, the patient was identified and taken to the operating room and placed in the supine position.  General anesthesia was administered as well as perioperative IV antibiotics.  At the beginning of the case, a time-out was performed to properly identify the patient, the surgery to be performed, and the surgical site.  Sequential compression devices were applied to the lower extremities at the beginning of the case for DVT prophylaxis.  The patient was then placed in the dorsal lithotomy supine position, prepped and draped in sterile fashion.  We then passed the 21-French rigid cystoscope through the urethra and into the bladder under vision without any difficulty, noting a normal urethra without strictures.  A systematic evaluation of the bladder revealed no evidence of any suspicious bladder lesions.  Ureteral orifices were in normal position.    we then passed a 0.038 glide wire up to the level of the renal pelvis.  The cystoscope was withdrawn, and a dual lumen catheter was inserted over the glide wire into the distal  ureter. A gentle retrograde pyelogram was performed, revealing a normal caliber ureter without any filling defects. There was hydronephrosis of the collecting system. There was a filling defect in the proximal ureter corresponding to the stone. A 0.038 sensor wire was then passed up to the level of the renal pelvis and secured to the drape as a safety wire. The dual lumen was removed.  An 11/13Fr ureteral access sheath was carefully advanced up the ureter to the level of the UPJ over this wire under fluoroscopic guidance. The flexible ureteroscope was advanced into the collecting system via the access sheath. The collecting system was inspected. The calculus was identified in the proximal ureter and pushed into the kidney. Using the 242 micron holmium laser fiber, the stone was dusted completely. These were sent for chemical analysis. With the ureteroscope in the kidney, a gentle pyelogram was performed to delineate the calyceal system and we evaluated the calyces systematically. We encountered no further stone fragments > 2mm. The rest of the stone fragments were very tiny and these were  irrigated away gently. The calyces were re-inspected and there were no significant stone fragment residual.   We then withdrew the ureteroscope back down the ureter along with the access sheath, noting no evidence of any stones along the course of the ureter.  Prior to removing the ureteroscope, we did pass the Glidewire back up to the ureter to the renal pelvis.  Once the ureteroscope was removed, we then used the Glidewire under fluoroscopic guidance and passed up a 6-French x 24 cm double-pigtail ureteral stent up the ureter, making sure that the proximal and distal ends coiled within the kidney and bladder respectively.  Note that we left a tether string attached to the distal end of the ureteral stent and it exited the urethral meatus and was hidden in the vagina.  The cystoscope was then advanced back into the bladder  under vision.  We were able to see the distal stent coiling nicely within the bladder.  The bladder was then emptied with irrigation solution.  The cystoscope was then removed.    The patient tolerated the procedure well and there was no complication. Patient was awoken from anesthesia and taken to the recovery room in stable condition. I was present and scrubbed for the entirety of the case.  Plan:  Patient will be discharged home and may remove stent on Monday   Neva Seat MD [facility]  Pager: [phone]

## 2023-07-24 NOTE — Anesthesia Procedure Notes (Signed)
Procedure Name: LMA Insertion Date/Time: 07/24/2023 12:30 PM  Performed by: Sampson Goon, CRNAPre-anesthesia Checklist: Patient identified, Emergency Drugs available, Suction available and Patient being monitored Patient Re-evaluated:Patient Re-evaluated prior to induction Oxygen Delivery Method: Circle System Utilized Preoxygenation: Pre-oxygenation with 100% oxygen Induction Type: IV induction LMA: LMA inserted LMA Size: 3.0 Number of attempts: 1 Placement Confirmation: positive ETCO2 Tube secured with: Tape Dental Injury: Teeth and Oropharynx as per pre-operative assessment

## 2023-07-24 NOTE — H&P (Signed)
H&P  History of Present Illness: Mariah Kelley is a 72 y.o. year old F who presents today for treatment of a right ureteral stone  No acute complaints  Past Medical History:  Diagnosis Date   Arthritis    Asthma    Breast cancer of upper-outer quadrant of right female breast (HCC) 12/23/2014   Bruises easily    CAD S/P percutaneous coronary angioplasty 01/17/2014   takes Brilinta and Plavix daily   Cataracts, bilateral    Complication of anesthesia    COPD (chronic obstructive pulmonary disease) (HCC)    Diabetes mellitus without complication (HCC)    pre DM per pt. now after weight loss surgery   no meds   Dyspnea    History of bronchitis 7-36yrs ago   History of tobacco abuse    40 pack-years   Hyperlipidemia    takes Lipitor daily   Hypertension    takes Metoprolol daily   Joint pain    Obesity    OSA treated with BiPAP    moderate with AHI 19/hr now on BIPAP auto   PONV (postoperative nausea and vomiting)    [place]   ST elevation myocardial infarction (STEMI) of inferolateral wall (HCC) 01/17/2014    Past Surgical History:  Procedure Laterality Date   ABDOMINAL HYSTERECTOMY     APPENDECTOMY     BREAST REDUCTION SURGERY Left 01/27/2015   BREAST REDUCTION SURGERY Left 01/27/2015   Procedure: MAMMARY REDUCTION  LEFT (BREAST);  Surgeon: Glenna Fellows, MD;  Location: [facility] OR;  Service: Plastics;  Laterality: Left;   CATARACT EXTRACTION W/PHACO Right 04/11/2016   Procedure: CATARACT EXTRACTION PHACO AND INTRAOCULAR LENS PLACEMENT (IOC);  Surgeon: Jethro Bolus, MD;  Location: [facility] ORS;  Service: Ophthalmology;  Laterality: Right;  CDE: 14.15   CATARACT EXTRACTION W/PHACO Left 05/02/2016   Procedure: CATARACT EXTRACTION PHACO AND INTRAOCULAR LENS PLACEMENT (IOC);  Surgeon: Jethro Bolus, MD;  Location: [facility] ORS;  Service: Ophthalmology;  Laterality: Left;  CDE: 12.11   CHOLECYSTECTOMY     COLONOSCOPY     EYE SURGERY     LEFT HEART CATH Bilateral 01/17/2014   Procedure:  LEFT HEART CATH;  Surgeon: Runell Gess, MD;  Location: [facility] CATH LAB;  Service: Cardiovascular;  Laterality: Bilateral;   MASTECTOMY W/ SENTINEL NODE BIOPSY Right 01/27/2015   dr Ezzard Standing    MASTECTOMY W/ SENTINEL NODE BIOPSY Right 01/27/2015   Procedure: RIGHT MASTECTOMY WITH RIGHT AXILLARY  SENTINEL LYMPH NODE BIOPSY;  Surgeon: Ovidio Kin, MD;  Location: [facility] OR;  Service: General;  Laterality: Right;   PERCUTANEOUS CORONARY STENT INTERVENTION (PCI-S)     PORTACATH PLACEMENT Left 03/23/2015   Procedure: INSERTION PORT-A-CATH ;  Surgeon: Ovidio Kin, MD;  Location: [facility] ORS;  Service: General;  Laterality: Left;   sleeve gastrectomy     SPINAL FUSION     TOTAL KNEE ARTHROPLASTY Left 10/13/2019   Procedure: TOTAL KNEE ARTHROPLASTY;  Surgeon: Dannielle Huh, MD;  Location: [facility] ORS;  Service: Orthopedics;  Laterality: Left;  75 mins needed for length of case.   TOTAL SHOULDER ARTHROPLASTY Left 04/19/2017   Procedure: TOTAL SHOULDER ARTHROPLASTY;  Surgeon: Jones Broom, MD;  Location: [facility] OR;  Service: Orthopedics;  Laterality: Left;  Left total shoulder arthroplasty   TRIGGER FINGER RELEASE Right 10/13/2021   Procedure: RELEASE TRIGGER FINGER/A-1 PULLEY RIGHT MIDDLE FINGER;  Surgeon: Cindee Salt, MD;  Location: [facility];  Service: Orthopedics;  Laterality: Right;   TUBAL LIGATION      Home Medications:  Current Meds  Medication Sig   acetaminophen (TYLENOL) 500 MG tablet Take 1,000 mg by mouth every 6 (six) hours as needed for moderate pain.   amLODipine (NORVASC) 5 MG tablet Take 1 tablet (5 mg total) by mouth daily.   aspirin 81 MG chewable tablet Chew 81 mg by mouth daily.   atorvastatin (LIPITOR) 80 MG tablet TAKE 1 TABLET BY MOUTH DAILY AT 6 PM.   Calcium Carbonate-Vitamin D (CALCIUM-D PO) Take 2 tablets by mouth daily.   celecoxib (CELEBREX) 200 MG capsule Take 200 mg by mouth 2 (two) times daily.   cholecalciferol (VITAMIN D3) 25 MCG (1000 UNIT) tablet Take 1,000 Units by  mouth daily.   metoprolol tartrate (LOPRESSOR) 50 MG tablet TAKE 1 TABLET BY MOUTH TWICE A DAY   nitroGLYCERIN (NITROSTAT) 0.4 MG SL tablet PLACE 1 TABLET UNDER THE TONGUE EVERY 5 MINUTES AS NEEDED FOR CHEST PAIN.   oxyCODONE (OXY IR/ROXICODONE) 5 MG immediate release tablet Take 5 mg by mouth every 6 (six) hours as needed for severe pain.   polyethylene glycol (MIRALAX / GLYCOLAX) 17 g packet Take 17 g by mouth daily.   tamsulosin (FLOMAX) 0.4 MG CAPS capsule Take 0.4 mg by mouth at bedtime.    Allergies:  Allergies  Allergen Reactions   Prednisone Other (See Comments)    "made poison ivy worse", rebound effect when take prednisone   Codeine Nausea Only and Other (See Comments)    HEADACHE    Family History  Problem Relation Age of Onset   Heart attack Mother 60       passed at 55   Heart attack Father 48       s/p CABG, multiple PCIs   CAD Brother        Still living   CAD Brother        Still living    Social History:  reports that she quit smoking about 13 years ago. Her smoking use included cigarettes. She started smoking about 54 years ago. She has a 40 pack-year smoking history. She has quit using smokeless tobacco. She reports that she does not drink alcohol and does not use drugs.  ROS: A complete review of systems was performed.  All systems are negative except for pertinent findings as noted.  Physical Exam:  Vital signs in last 24 hours: Temp:  [97.7 F (36.5 C)] 97.7 F (36.5 C) (09/17 1024) Pulse Rate:  [69] 69 (09/17 1024) Resp:  [16] 16 (09/17 1024) BP: (183-192)/(77-83) 183/77 (09/17 1052) SpO2:  [96 %] 96 % (09/17 1024) Constitutional:  Alert and oriented, No acute distress Cardiovascular: Regular rate and rhythm Respiratory: Normal respiratory effort, Lungs clear bilaterally GI: Abdomen is soft, nontender, nondistended, no abdominal masses Lymphatic: No lymphadenopathy Neurologic: Grossly intact, no focal deficits Psychiatric: Normal mood and  affect   Laboratory Data:  No results for input(s): "WBC", "HGB", "HCT", "PLT" in the last 72 hours.  No results for input(s): "NA", "K", "CL", "GLUCOSE", "BUN", "CALCIUM", "CREATININE" in the last 72 hours.  Invalid input(s): "CO3"   Results for orders placed or performed during the hospital encounter of 07/24/23 (from the past 24 hour(s))  Glucose, capillary     Status: Abnormal   Collection Time: 07/24/23 10:15 AM  Result Value Ref Range   Glucose-Capillary 124 (H) 70 - 99 mg/dL   No results found for this or any previous visit (from the past 240 hour(s)).  Renal Function: Recent Labs    07/18/23 1641  CREATININE 1.00  Estimated Creatinine Clearance: 67.5 mL/min (by C-G formula based on SCr of 1 mg/dL).  Radiologic Imaging: No results found.  Assessment:  CHARRISSE CH is a 72 y.o. year old F with right ureteral stone stone   Plan:  --to OR as planned for R ureteroscopy with laser litho, stent. Procedure and risks reviewed, including but not limited to hematuria, infection, sepsis, damage to GU tract, failure to complete procedure, retained stone fragments, need for future procedures, stent pain, prolonged stent.   Irine Seal, MD 07/24/2023, 11:54 AM  [facility] Pager: [phone]

## 2023-07-24 NOTE — Transfer of Care (Signed)
Immediate Anesthesia Transfer of Care Note  Patient: Mariah Kelley  Procedure(s) Performed: RIGHT URETEROSCOPY/HOLMIUM LASER/STENT PLACEMENT (Right)  Patient Location: PACU  Anesthesia Type:MAC and General  Level of Consciousness: awake and alert   Airway & Oxygen Therapy: Patient Spontanous Breathing and Patient connected to nasal cannula oxygen  Post-op Assessment: Report given to RN and Post -op Vital signs reviewed and stable  Post vital signs: Reviewed and stable  Last Vitals:  Vitals Value Taken Time  BP 133/53 07/24/23 1338  Temp    Pulse 72 07/24/23 1340  Resp 16 07/24/23 1340  SpO2 100 % 07/24/23 1340  Vitals shown include unfiled device data.  Last Pain:  Vitals:   07/24/23 1024  TempSrc: Oral  PainSc:          Complications: No notable events documented.

## 2023-07-24 NOTE — Discharge Instructions (Addendum)
[facility] [phone] Post Ureteroscopy With or Without Stent Instructions **remove stent by pulling on string on Monday  Definitions:  Ureter: The duct that transports urine from the kidney to the bladder. Stent:   A plastic hollow tube that is placed into the ureter, from the kidney to the bladder to prevent the ureter from swelling shut.  GENERAL INSTRUCTIONS:  Despite the fact that no skin incisions were used, the area around the ureter and bladder is raw and irritated. The stent is a foreign body which will further irritate the bladder wall. This irritation is manifested by increased frequency of urination, both day and night, and by an increase in the urge to urinate. In some, the urge to urinate is present almost always. Sometimes the urge is strong enough that you may not be able to stop yourself from urinating. The only real cure is to remove the stent and then give time for the bladder wall to heal which can't be done until the danger of the ureter swelling shut has passed, which varies.  You may see some blood in your urine while the stent is in place and a few days afterwards. Do not be alarmed, even if the urine was clear for a while. Get off your feet and drink lots of fluids until clearing occurs. If you start to pass clots or don't improve, call us.  DIET: You may return to your normal diet immediately. Because of the raw surface of your bladder, alcohol, spicy foods, acid type foods and drinks with caffeine may cause irritation or frequency and should be used in moderation. To keep your urine flowing freely and to avoid constipation, drink plenty of fluids during the day ( 8-10 glasses ). Tip: Avoid cranberry juice because it is very acidic.  ACTIVITY: Your physical activity doesn't need to be restricted. However, if you are very active, you may see some blood in your urine. We suggest that you reduce your activity under these circumstances until the bleeding  has stopped.  BOWELS: It is important to keep your bowels regular during the postoperative period. Straining with bowel movements can cause bleeding. A bowel movement every other day is reasonable. Use a mild laxative if needed, such as Milk of Magnesia 2-3 tablespoons, or 2 Dulcolax tablets. Call if you continue to have problems. If you have been taking narcotics for pain, before, during or after your surgery, you may be constipated. Take a laxative if necessary.   MEDICATION: You should resume your pre-surgery medications unless told not to. In addition you will often be given an antibiotic to prevent infection and likely several as needed medications for stent related discomfort. These should be taken as prescribed until the bottles are finished unless you are having an unusual reaction to one of the drugs.  PROBLEMS YOU SHOULD REPORT TO [place]: Fevers over 100.5 Fahrenheit. Heavy bleeding, or clots ( See above notes about blood in urine ). Inability to urinate. Drug reactions ( hives, rash, nausea, vomiting, diarrhea ). Severe burning or pain with urination that is not improving.

## 2023-07-24 NOTE — Anesthesia Postprocedure Evaluation (Signed)
Anesthesia Post Note  Patient: Mariah Kelley  Procedure(s) Performed: RIGHT URETEROSCOPY/HOLMIUM LASER/STENT PLACEMENT (Right)     Patient location during evaluation: PACU Anesthesia Type: General Level of consciousness: awake and alert Pain management: pain level controlled Vital Signs Assessment: post-procedure vital signs reviewed and stable Respiratory status: spontaneous breathing, nonlabored ventilation and respiratory function stable Cardiovascular status: stable and blood pressure returned to baseline Anesthetic complications: no   No notable events documented.  Last Vitals:  Vitals:   07/24/23 1400 07/24/23 1430  BP: (!) 158/64 (!) 168/60  Pulse: 69 64  Resp: 17   Temp:    SpO2: 94% 93%    Last Pain:  Vitals:   07/24/23 1400  TempSrc:   PainSc: 0-No pain                 Beryle Lathe

## 2023-07-25 ENCOUNTER — Ambulatory Visit: Payer: Medicare Other | Admitting: Cardiovascular Disease

## 2023-07-25 ENCOUNTER — Encounter (HOSPITAL_COMMUNITY): Payer: Self-pay | Admitting: Urology

## 2023-07-26 ENCOUNTER — Encounter (HOSPITAL_BASED_OUTPATIENT_CLINIC_OR_DEPARTMENT_OTHER): Payer: Self-pay | Admitting: Family

## 2023-07-26 ENCOUNTER — Ambulatory Visit (INDEPENDENT_AMBULATORY_CARE_PROVIDER_SITE_OTHER): Payer: Medicare Other | Admitting: Family

## 2023-07-26 VITALS — BP 124/68 | HR 85 | Ht 67.0 in | Wt 259.0 lb

## 2023-07-26 DIAGNOSIS — I10 Essential (primary) hypertension: Secondary | ICD-10-CM | POA: Diagnosis not present

## 2023-07-26 DIAGNOSIS — Z006 Encounter for examination for normal comparison and control in clinical research program: Secondary | ICD-10-CM

## 2023-07-26 DIAGNOSIS — E785 Hyperlipidemia, unspecified: Secondary | ICD-10-CM | POA: Diagnosis not present

## 2023-07-26 DIAGNOSIS — Z9861 Coronary angioplasty status: Secondary | ICD-10-CM

## 2023-07-26 DIAGNOSIS — G4733 Obstructive sleep apnea (adult) (pediatric): Secondary | ICD-10-CM

## 2023-07-26 DIAGNOSIS — I2510 Atherosclerotic heart disease of native coronary artery without angina pectoris: Secondary | ICD-10-CM | POA: Diagnosis not present

## 2023-07-26 DIAGNOSIS — I25118 Atherosclerotic heart disease of native coronary artery with other forms of angina pectoris: Secondary | ICD-10-CM | POA: Diagnosis not present

## 2023-07-26 MED ORDER — METOPROLOL SUCCINATE ER 50 MG PO TB24
50.0000 mg | ORAL_TABLET | Freq: Every day | ORAL | 3 refills | Status: DC
Start: 2023-07-26 — End: 2024-03-27

## 2023-07-26 NOTE — Research (Signed)
  Subject Name: Mariah Kelley met inclusion and exclusion criteria for the Virtual Care and Social Determinant Interventions for the management of hypertension trial.  The informed consent form, study requirements and expectations were reviewed with the subject by Dr. Duke Salvia and myself. The subject was given the opportunity to read the consent and ask questions. The subject verbalized understanding of the trial requirements.  All questions were addressed prior to the signing of the consent form. The subject agreed to participate in the trial and signed the informed consent. The informed consent was obtained prior to performance of any protocol-specific procedures for the subject.  A copy of the signed informed consent was given to the subject and a copy was placed in the subject's medical record.  [name] was randomized to Group 2.

## 2023-07-26 NOTE — Patient Instructions (Signed)
Medication Instructions:  STOP METOPROLOL TART   START METOPROLOL SUC IN THE MORNING  TAKE YOUR AMLODIPINE IN THE MORNING    Labwork: THYROID PANEL/RENIN/ALDOSTERONE TODAY    Testing/Procedures: Your physician has requested that you have a renal artery duplex. During this test, an ultrasound is used to evaluate blood flow to the kidneys. Allow one hour for this exam. Do not eat after midnight the day before and avoid carbonated beverages. Take your medications as you usually do.   Follow-Up: 2 MONTHS WITH PHARM D   4 MONTHS WITH DR Marne IN [facility]   You will receive a phone call from the PREP exercise and nutrition program to schedule an initial assessment.   Special Instructions:  MONITOR YOUR BLOOD PRESSURE TWICE A DAY WITH MACHINE PROVIDED. MAKE SURE YOU ARE LOGGED INTO YOUR VIVIFY APP WHEN CHECKING   DASH Eating Plan DASH stands for "Dietary Approaches to Stop Hypertension." The DASH eating plan is a healthy eating plan that has been shown to reduce high blood pressure (hypertension). It may also reduce your risk for type 2 diabetes, heart disease, and stroke. The DASH eating plan may also help with weight loss. What are tips for following this plan?  General guidelines Avoid eating more than 2,300 mg (milligrams) of salt (sodium) a day. If you have hypertension, you may need to reduce your sodium intake to 1,500 mg a day. Limit alcohol intake to no more than 1 drink a day for nonpregnant women and 2 drinks a day for men. One drink equals 12 oz of beer, 5 oz of wine, or 1 oz of hard liquor. Work with your health care provider to maintain a healthy body weight or to lose weight. Ask what an ideal weight is for you. Get at least 30 minutes of exercise that causes your heart to beat faster (aerobic exercise) most days of the week. Activities may include walking, swimming, or biking. Work with your health care provider or diet and nutrition specialist (dietitian) to adjust  your eating plan to your individual calorie needs. Reading food labels  Check food labels for the amount of sodium per serving. Choose foods with less than 5 percent of the Daily Value of sodium. Generally, foods with less than 300 mg of sodium per serving fit into this eating plan. To find whole grains, look for the word "whole" as the first word in the ingredient list. Shopping Buy products labeled as "low-sodium" or "no salt added." Buy fresh foods. Avoid canned foods and premade or frozen meals. Cooking Avoid adding salt when cooking. Use salt-free seasonings or herbs instead of table salt or sea salt. Check with your health care provider or pharmacist before using salt substitutes. Do not fry foods. Cook foods using healthy methods such as baking, boiling, grilling, and broiling instead. Cook with heart-healthy oils, such as olive, canola, soybean, or sunflower oil. Meal planning Eat a balanced diet that includes: 5 or more servings of fruits and vegetables each day. At each meal, try to fill half of your plate with fruits and vegetables. Up to 6-8 servings of whole grains each day. Less than 6 oz of lean meat, poultry, or fish each day. A 3-oz serving of meat is about the same size as a deck of cards. One egg equals 1 oz. 2 servings of low-fat dairy each day. A serving of nuts, seeds, or beans 5 times each week. Heart-healthy fats. Healthy fats called Omega-3 fatty acids are found in foods such as  flaxseeds and coldwater fish, like sardines, salmon, and mackerel. Limit how much you eat of the following: Canned or prepackaged foods. Food that is high in trans fat, such as fried foods. Food that is high in saturated fat, such as fatty meat. Sweets, desserts, sugary drinks, and other foods with added sugar. Full-fat dairy products. Do not salt foods before eating. Try to eat at least 2 vegetarian meals each week. Eat more home-cooked food and less restaurant, buffet, and fast  food. When eating at a restaurant, ask that your food be prepared with less salt or no salt, if possible. What foods are recommended? The items listed may not be a complete list. Talk with your dietitian about what dietary choices are best for you. Grains Whole-grain or whole-wheat bread. Whole-grain or whole-wheat pasta. Brown rice. Orpah Cobb. Bulgur. Whole-grain and low-sodium cereals. Pita bread. Low-fat, low-sodium crackers. Whole-wheat flour tortillas. Vegetables Fresh or frozen vegetables (raw, steamed, roasted, or grilled). Low-sodium or reduced-sodium tomato and vegetable juice. Low-sodium or reduced-sodium tomato sauce and tomato paste. Low-sodium or reduced-sodium canned vegetables. Fruits All fresh, dried, or frozen fruit. Canned fruit in natural juice (without added sugar). Meat and other protein foods Skinless chicken or [place]. Ground chicken or [place]. Pork with fat trimmed off. Fish and seafood. Egg whites. Dried beans, peas, or lentils. Unsalted nuts, nut butters, and seeds. Unsalted canned beans. Lean cuts of beef with fat trimmed off. Low-sodium, lean deli meat. Dairy Low-fat (1%) or fat-free (skim) milk. Fat-free, low-fat, or reduced-fat cheeses. Nonfat, low-sodium ricotta or cottage cheese. Low-fat or nonfat yogurt. Low-fat, low-sodium cheese. Fats and oils Soft margarine without trans fats. Vegetable oil. Low-fat, reduced-fat, or light mayonnaise and salad dressings (reduced-sodium). Canola, safflower, olive, soybean, and sunflower oils. Avocado. Seasoning and other foods Herbs. Spices. Seasoning mixes without salt. Unsalted popcorn and pretzels. Fat-free sweets. What foods are not recommended? The items listed may not be a complete list. Talk with your dietitian about what dietary choices are best for you. Grains Baked goods made with fat, such as croissants, muffins, or some breads. Dry pasta or rice meal packs. Vegetables Creamed or fried vegetables. Vegetables  in a cheese sauce. Regular canned vegetables (not low-sodium or reduced-sodium). Regular canned tomato sauce and paste (not low-sodium or reduced-sodium). Regular tomato and vegetable juice (not low-sodium or reduced-sodium). Rosita Fire. Olives. Fruits Canned fruit in a light or heavy syrup. Fried fruit. Fruit in cream or butter sauce. Meat and other protein foods Fatty cuts of meat. Ribs. Fried meat. Tomasa Blase. Sausage. Bologna and other processed lunch meats. Salami. Fatback. Hotdogs. Bratwurst. Salted nuts and seeds. Canned beans with added salt. Canned or smoked fish. Whole eggs or egg yolks. Chicken or [place] with skin. Dairy Whole or 2% milk, cream, and half-and-half. Whole or full-fat cream cheese. Whole-fat or sweetened yogurt. Full-fat cheese. Nondairy creamers. Whipped toppings. Processed cheese and cheese spreads. Fats and oils Butter. Stick margarine. Lard. Shortening. Ghee. Bacon fat. Tropical oils, such as coconut, palm kernel, or palm oil. Seasoning and other foods Salted popcorn and pretzels. Onion salt, garlic salt, seasoned salt, table salt, and sea salt. Worcestershire sauce. Tartar sauce. Barbecue sauce. Teriyaki sauce. Soy sauce, including reduced-sodium. Steak sauce. Canned and packaged gravies. Fish sauce. Oyster sauce. Cocktail sauce. Horseradish that you find on the shelf. Ketchup. Mustard. Meat flavorings and tenderizers. Bouillon cubes. Hot sauce and Tabasco sauce. Premade or packaged marinades. Premade or packaged taco seasonings. Relishes. Regular salad dressings. Where to find more information: National Heart, Lung, and Blood Institute:  PopSteam.is American Heart Association: www.heart.org Summary The DASH eating plan is a healthy eating plan that has been shown to reduce high blood pressure (hypertension). It may also reduce your risk for type 2 diabetes, heart disease, and stroke. With the DASH eating plan, you should limit salt (sodium) intake to 2,300 mg a day. If you  have hypertension, you may need to reduce your sodium intake to 1,500 mg a day. When on the DASH eating plan, aim to eat more fresh fruits and vegetables, whole grains, lean proteins, low-fat dairy, and heart-healthy fats. Work with your health care provider or diet and nutrition specialist (dietitian) to adjust your eating plan to your individual calorie needs. This information is not intended to replace advice given to you by your health care provider. Make sure you discuss any questions you have with your health care provider. Document Released: 10/12/2011 Document Revised: 10/05/2017 Document Reviewed: 10/16/2016 Elsevier Patient Education  2020 [employer].

## 2023-07-26 NOTE — Progress Notes (Signed)
[facility] Initial Assessment:    Date:  07/30/2023   ID:  Mariah Kelley, DOB 1951/01/26, MRN [id number]  PCP:  Mila Palmer, MD  Cardiologist:  Nanetta Batty, MD  Nephrologist:  Referring MD: Azalee Course, PA   CC: Hypertension  History of Present Illness:    Mariah Kelley is a 72 y.o. female with a hx of HTN, HLD, OSA on BIPAP, prior tobacco use, obesity, CAD, bariatric surgery 12/2018, breast cancer s/p mastectomy here to establish care in the [facility].   Admitted 01/2014 with STEMI with cardiac cath 80% stenosis of mid RCA treated with DES.   Seen by Azalee Course, PA 07/18/23 with BP 210/108 after being sent by preadmission team for elevated BP. Pending kidney stone removal. Amlodipine 5mg  daily was added. She was referred to Hypertension Clinic.   She has since undergone right ureteroscopy 07/24/23 with BP during procedure 192/83 ? 183/77 ? 133/53 ? 144/58 ? 158/64 ? 171/84 ? 168/60.   Presents today for follow up. 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 Tells me BP used to be a little high in the doctors office but at home more often 120-130s. Blood pressure checked with arm cuff at home. Readings have been labile with range 91/55 - 190s/90s. she reports tobacco use having quit in 2010. Alcohol use rarely. For exercise she has not had a formal routine over the last month due to her kidney stone. she eats at home and outside of the home and does not follow low sodium diet. She has reduced her sodium intake significantly since her last visit. Taking her Amlodipine evening between 5:30-6:30 PM and Metoprolol at 6:30AM and 9 PM.   Previous antihypertensives: Lisinopril - cough   Past Medical History:  Diagnosis Date   Arthritis    Asthma    Breast cancer of upper-outer quadrant of right female breast (HCC) 12/23/2014   Bruises easily    CAD S/P percutaneous coronary angioplasty 01/17/2014   takes Brilinta and Plavix  daily   Cataracts, bilateral    Complication of anesthesia    COPD (chronic obstructive pulmonary disease) (HCC)    Diabetes mellitus without complication (HCC)    pre DM per pt. now after weight loss surgery   no meds   Dyspnea    History of bronchitis 7-66yrs ago   History of tobacco abuse    40 pack-years   Hyperlipidemia    takes Lipitor daily   Hypertension    takes Metoprolol daily   Joint pain    Obesity    OSA treated with BiPAP    moderate with AHI 19/hr now on BIPAP auto   PONV (postoperative nausea and vomiting)    [place]   ST elevation myocardial infarction (STEMI) of inferolateral wall (HCC) 01/17/2014    Past Surgical History:  Procedure Laterality Date   ABDOMINAL HYSTERECTOMY     APPENDECTOMY     BREAST REDUCTION SURGERY Left 01/27/2015   BREAST REDUCTION SURGERY Left 01/27/2015   Procedure: MAMMARY REDUCTION  LEFT (BREAST);  Surgeon: Glenna Fellows, MD;  Location: [facility] OR;  Service: Plastics;  Laterality: Left;   CATARACT EXTRACTION W/PHACO Right 04/11/2016   Procedure: CATARACT EXTRACTION PHACO AND INTRAOCULAR LENS PLACEMENT (IOC);  Surgeon: Jethro Bolus, MD;  [facility]: [facility];  Service: Ophthalmology;  Laterality: Right;  CDE: 14.15   CATARACT EXTRACTION W/PHACO Left 05/02/2016   Procedure: CATARACT EXTRACTION PHACO AND INTRAOCULAR LENS PLACEMENT (IOC);  Surgeon: Jethro Bolus, MD;  Location: [facility];  Service: Ophthalmology;  Laterality: Left;  CDE: 12.11   CHOLECYSTECTOMY     COLONOSCOPY     CYSTOSCOPY/URETEROSCOPY/HOLMIUM LASER/STENT PLACEMENT Right 07/24/2023   Procedure: RIGHT URETEROSCOPY/HOLMIUM LASER/STENT PLACEMENT;  Surgeon: Despina Arias, MD;  [facility]: [facility];  Service: Urology;  Laterality: Right;   EYE SURGERY     LEFT HEART CATH Bilateral 01/17/2014   Procedure: LEFT HEART CATH;  Surgeon: Runell Gess, MD;  Location: [facility] CATH LAB;  Service: Cardiovascular;  Laterality: Bilateral;   MASTECTOMY W/ SENTINEL NODE BIOPSY Right 01/27/2015   dr Ezzard Standing     MASTECTOMY W/ SENTINEL NODE BIOPSY Right 01/27/2015   Procedure: RIGHT MASTECTOMY WITH RIGHT AXILLARY  SENTINEL LYMPH NODE BIOPSY;  Surgeon: Ovidio Kin, MD;  Location: [facility] OR;  Service: General;  Laterality: Right;   PERCUTANEOUS CORONARY STENT INTERVENTION (PCI-S)     PORTACATH PLACEMENT Left 03/23/2015   Procedure: INSERTION PORT-A-CATH ;  Surgeon: Ovidio Kin, MD;  Location: [facility]S;  Service: General;  Laterality: Left;   sleeve gastrectomy     SPINAL FUSION     TOTAL KNEE ARTHROPLASTY Left 10/13/2019   Procedure: TOTAL KNEE ARTHROPLASTY;  Surgeon: Dannielle Huh, MD;  [facility]: [facility];  Service: Orthopedics;  Laterality: Left;  75 mins needed for length of case.   TOTAL SHOULDER ARTHROPLASTY Left 04/19/2017   Procedure: TOTAL SHOULDER ARTHROPLASTY;  Surgeon: Jones Broom, MD;  Location: [facility] OR;  Service: Orthopedics;  Laterality: Left;  Left total shoulder arthroplasty   TRIGGER FINGER RELEASE Right 10/13/2021   Procedure: RELEASE TRIGGER FINGER/A-1 PULLEY RIGHT MIDDLE FINGER;  Surgeon: Cindee Salt, MD;  Location: [facility];  Service: Orthopedics;  Laterality: Right;   TUBAL LIGATION      Current Medications: Current Meds  Medication Sig   acetaminophen (TYLENOL) 500 MG tablet Take 1,000 mg by mouth every 6 (six) hours as needed for moderate pain.   amLODipine (NORVASC) 5 MG tablet Take 1 tablet (5 mg total) by mouth daily.   aspirin 81 MG chewable tablet Chew 81 mg by mouth daily.   atorvastatin (LIPITOR) 80 MG tablet TAKE 1 TABLET BY MOUTH DAILY AT 6 PM.   Calcium Carbonate-Vitamin D (CALCIUM-D PO) Take 2 tablets by mouth daily.   celecoxib (CELEBREX) 200 MG capsule Take 1 capsule (200 mg total) by mouth 2 (two) times daily.   cholecalciferol (VITAMIN D3) 25 MCG (1000 UNIT) tablet Take 1,000 Units by mouth daily.   hyoscyamine (LEVSIN/SL) 0.125 MG SL tablet Place 1 tablet (0.125 mg total) under the tongue every 6 (six) hours as needed (bladder spasms).   inclisiran  (LEQVIO) 284 MG/1.5ML SOSY injection Inject 284 mg into the skin every 6 (six) months.   metoprolol succinate (TOPROL-XL) 50 MG 24 hr tablet Take 1 tablet (50 mg total) by mouth daily. Take with or immediately following a meal.   nitroGLYCERIN (NITROSTAT) 0.4 MG SL tablet PLACE 1 TABLET UNDER THE TONGUE EVERY 5 MINUTES AS NEEDED FOR CHEST PAIN.   oxyCODONE (OXY IR/ROXICODONE) 5 MG immediate release tablet Take 1 tablet (5 mg total) by mouth every 6 (six) hours as needed for severe pain.   polyethylene glycol (MIRALAX / GLYCOLAX) 17 g packet Take 17 g by mouth daily.   sulfamethoxazole-trimethoprim (BACTRIM DS) 800-160 MG tablet Take 1 tablet by mouth 2 (two) times daily.   tamsulosin (FLOMAX) 0.4 MG CAPS capsule Take 0.4 mg by mouth at bedtime.   [DISCONTINUED] metoprolol tartrate (LOPRESSOR) 50 MG tablet TAKE 1 TABLET  BY MOUTH TWICE A DAY     Allergies:   Prednisone and Codeine   Social History   Socioeconomic History   Marital status: Single    Spouse name: Not on file   Number of children: Not on file   Years of education: Not on file   Highest education level: Not on file  Occupational History   Occupation: Social Worker  Tobacco Use   Smoking status: Former    Current packs/day: 0.00    Average packs/day: 1 pack/day for 40.0 years (40.0 ttl pk-yrs)    Types: Cigarettes    Start date: 07/30/1969    Quit date: 07/30/2009    Years since quitting: 14.0   Smokeless tobacco: Former   Tobacco comments:    quit 5 yrs ago  Vaping Use   Vaping status: Never Used  Substance and Sexual Activity   Alcohol use: No   Drug use: No   Sexual activity: Not Currently    Birth control/protection: Surgical  Other Topics Concern   Not on file  Social History Narrative   Divorced, 2 children.    Social Determinants of Health   Financial Resource Strain: Low Risk  (07/26/2023)   Overall Financial Resource Strain (CARDIA)    Difficulty of Paying Living Expenses: Not hard at all  Food  Insecurity: No Food Insecurity (07/26/2023)   Hunger Vital Sign    Worried About Running Out of Food in the Last Year: Never true    Ran Out of Food in the Last Year: Never true  Transportation Needs: No Transportation Needs (07/26/2023)   PRAPARE - Administrator, Civil Service (Medical): No    Lack of Transportation (Non-Medical): No  Physical Activity: Inactive (07/26/2023)   Exercise Vital Sign    Days of Exercise per Week: 0 days    Minutes of Exercise per Session: 0 min  Stress: Not on file  Social Connections: Not on file     Family History: The patient's family history includes CAD in her brother and brother; Heart attack (age of onset: 16) in her mother; Heart attack (age of onset: 14) in her father; Hypertension in her maternal grandmother and mother.  ROS:   Please see the history of present illness.     All other systems reviewed and are negative.  EKGs/Labs/Other Studies Reviewed:         Recent Labs: 07/14/2023: ALT 27; Hemoglobin 11.7; Platelets 273 07/18/2023: BUN 16; Creatinine, Ser 1.00; Potassium 5.1; Sodium 146   Recent Lipid Panel    Component Value Date/Time   CHOL 132 10/16/2018 0822   TRIG 120 10/16/2018 0822   HDL 51 10/16/2018 0822   CHOLHDL 2.6 10/16/2018 0822   CHOLHDL 4.7 01/18/2014 0314   VLDL 25 01/18/2014 0314   LDLCALC 57 10/16/2018 0822    Physical Exam:   VS:  BP 124/68 Comment: left arm  Pulse 85   Ht 5\' 7"  (1.702 m)   Wt 259 lb (117.5 kg)   SpO2 93%   BMI 40.57 kg/m  , BMI Body mass index is 40.57 kg/m. GENERAL:  Well appearing HEENT: Pupils equal round and reactive, fundi not visualized, oral mucosa unremarkable NECK:  No jugular venous distention, waveform within normal limits, carotid upstroke brisk and symmetric, no bruits, no thyromegaly LYMPHATICS:  No cervical adenopathy LUNGS:  Clear to auscultation bilaterally HEART:  RRR.  PMI not displaced or sustained,S1 and S2 within normal limits, no S3, no S4, no clicks,  no rubs,  no murmurs ABD:  Flat, positive bowel sounds normal in frequency in pitch, no bruits, no rebound, no guarding, no midline pulsatile mass, no hepatomegaly, no splenomegaly EXT:  2 plus pulses throughout, no edema, no cyanosis no clubbing SKIN:  No rashes no nodules NEURO:  Cranial nerves II through XII grossly intact, motor grossly intact throughout PSYCH:  Cognitively intact, oriented to person place and time   ASSESSMENT/PLAN:    HTN - initial BP 159/75 with repeat BP 124/68 without intervention. Home BP labile with a range of 91/55-190s/90s.  Present regimen metoprolol tartrate 25 mg twice daily, amlodipine 5 mg daily.  Stop metoprolol tartrate and start metoprolol succinate 25 mg daily for easier dosing. Labs today thyroid panel, renin aldosterone to assess for secondary hypertension. Plan for renal artery duplex to rule out renal stenosis as contributory. Refer to PREP exercise program at the [facility]. Enroll in [employer] RPM patient monitoring study given labile BP and likely element of whitecoat hypertension  CAD / HLD - Prior DES-RCA. Stable with no anginal symptoms. No indication for ischemic evaluation.  GDMT includes Aspirin, Atorvastatin, Metoprolol, Leqvio, PRN nitroglycerin.  OSA on BIPAP - BIPAP compliance encouraged.    Screening for Secondary Hypertension:     07/24/2023    5:00 PM  Causes  Drugs/Herbals Screened  Renovascular HTN Screened  Sleep Apnea Screened     - Comments wearing BIPAP regularly  Thyroid Disease Screened  Hyperaldosteronism Screened  Pheochromocytoma Screened     - Comments 07/2023 normal adrenals on CT  Compliance Screened    Relevant Labs/Studies:    Latest Ref Rng & Units 07/18/2023    4:41 PM 07/14/2023   11:19 AM 07/14/2023   11:11 AM  Basic Labs  Sodium 134 - 144 mmol/L 146  135  138   Potassium 3.5 - 5.2 mmol/L 5.1  8.0  4.6   Creatinine 0.57 - 1.00 mg/dL 6.57  8.46  9.62        Latest Ref Rng & Units 01/17/2014    3:39 PM   Thyroid   TSH 0.350 - 4.500 uIU/mL 1.318                 07/26/2023    2:25 PM  Renovascular   Renal Artery [place] Completed Yes     she consents to be monitored in our remote patient monitoring program through [employer].  she will track his blood pressure twice daily and understands that these trends will help [place] to adjust her medications as needed prior to his next appointment.  she  interested in enrolling in the PREP exercise and nutrition program through the [facility].     Disposition:    FU with MD/PharmD in 2 months    Medication Adjustments/Labs and Tests Ordered: Current medicines are reviewed at length with the patient today.  Concerns regarding medicines are outlined above.  Orders Placed This Encounter  Procedures   Aldosterone + renin activity w/ ratio   Thyroid Panel With TSH   Amb Referral To Provider Referral Exercise Program (P.R.E.P)   VAS US RENAL ARTERY DUPLEX   Meds ordered this encounter  Medications   metoprolol succinate (TOPROL-XL) 50 MG 24 hr tablet    Sig: Take 1 tablet (50 mg total) by mouth daily. Take with or immediately following a meal.    Dispense:  90 tablet    Refill:  3    D/C metoprolol tart    Order Specific Question:   Supervising Provider    Answer:  Jodelle Red [[id number]]     Signed, Alver Sorrow, NP  07/30/2023 1:17 PM    [facility]

## 2023-07-27 ENCOUNTER — Telehealth: Payer: Self-pay

## 2023-07-27 ENCOUNTER — Telehealth: Payer: Self-pay | Admitting: *Deleted

## 2023-07-27 NOTE — Telephone Encounter (Signed)
Called to discuss PREP class schedule at [facility], she would like to attend next class on October 1, every T/Th 10-11:15; assessment visit scheduled for 9/26 at 10a

## 2023-07-27 NOTE — Telephone Encounter (Signed)
Contacted regarding PREP Class referral. She is interested in participating at the [facility]. We will contact her with future class availability at that site.

## 2023-07-30 ENCOUNTER — Telehealth: Payer: Self-pay

## 2023-07-30 DIAGNOSIS — Z0000 Encounter for general adult medical examination without abnormal findings: Secondary | ICD-10-CM

## 2023-07-30 DIAGNOSIS — E785 Hyperlipidemia, unspecified: Secondary | ICD-10-CM | POA: Diagnosis not present

## 2023-07-30 DIAGNOSIS — Z23 Encounter for immunization: Secondary | ICD-10-CM | POA: Diagnosis not present

## 2023-07-30 DIAGNOSIS — E1121 Type 2 diabetes mellitus with diabetic nephropathy: Secondary | ICD-10-CM | POA: Diagnosis not present

## 2023-07-30 DIAGNOSIS — Z79899 Other long term (current) drug therapy: Secondary | ICD-10-CM | POA: Diagnosis not present

## 2023-07-30 DIAGNOSIS — E559 Vitamin D deficiency, unspecified: Secondary | ICD-10-CM | POA: Diagnosis not present

## 2023-07-30 LAB — LAB REPORT - SCANNED: EGFR: 61

## 2023-07-30 NOTE — Telephone Encounter (Signed)
Called patient to conduct welcome call. Patient felt that she was well informed about the program and have been in contact with tech support due to manually entering readings but have not received a follow up call. Patient did not need prompt times adjusted. A ticket was submitted on patient's behalf for technical support. Ticket #[id number]. Discussed health coaching for physical activity and patient did not express interest in participating at this time.    Renaee Munda, MS, ERHD, [facility]  Care Guide, Health & Wellness Coach [address], [address] [place] [address] Telephone: [phone] Email: [email or web address]

## 2023-08-01 LAB — ALDOSTERONE + RENIN ACTIVITY W/ RATIO
Aldos/Renin Ratio: 3.6 (ref 0.0–30.0)
Aldosterone: 4.1 ng/dL (ref 0.0–30.0)
Renin Activity, Plasma: 1.129 ng/mL/hr (ref 0.167–5.380)

## 2023-08-01 LAB — THYROID PANEL WITH TSH
Free Thyroxine Index: 3.4 (ref 1.2–4.9)
T3 Uptake Ratio: 28 % (ref 24–39)
T4, Total: 12 ug/dL (ref 4.5–12.0)
TSH: 1.13 u[IU]/mL (ref 0.450–4.500)

## 2023-08-02 NOTE — Progress Notes (Signed)
[facility] PREP Evaluation  Patient Details  Name: Mariah Kelley MRN: [id number] Date of Birth: September 28, 1951 Age: 72 y.o. PCP: Mila Palmer, MD  Vitals:   08/02/23 1023  BP: (!) 176/80  Pulse: 63  SpO2: 99%  Weight: 259 lb 9.6 oz (117.8 kg)     [facility] Eval - 08/02/23 1000       [facility] "PREP" Location   [facility] "PREP" Location [name]      Referral    Referring Provider Walker    Reason for referral Cancer;Diabetes;Family History;Hypertension;Inactivity;Obesitity/Overweight    Program Start Date 08/07/23      Measurement   Waist Circumference 51.5 inches    Hip Circumference 55 inches    Body fat 48 percent      Information for Trainer   Goals --   Establish exercise routine; BP goal: 120/80 or less   Current Exercise walking    Orthopedic Concerns --   s/p spinal fusion, L TKA, L shoudler replacement   Pertinent Medical History --   HTN, s/p MI w/PTCA; diabetes, OSA, R breast CA, kidney stone   Medications that affect exercise Beta blocker      Timed Up and Go (TUGS)   Timed Up and Go Low risk <9 seconds      Mobility and Daily Activities   I find it easy to walk up or down two or more flights of stairs. 1    I have no trouble taking out the trash. 4    I do housework such as vacuuming and dusting on my own without difficulty. 4    I can easily lift a gallon of milk (8lbs). 4    I can easily walk a mile. 2    I have no trouble reaching into high cupboards or reaching down to pick up something from the floor. 3    I do not have trouble doing out-door work such as Loss adjuster, chartered, raking leaves, or gardening. 1      Mobility and Daily Activities   I feel younger than my age. 2    I feel independent. 4    I feel energetic. 1    I live an active life.  2    I feel strong. 2    I feel healthy. 2    I feel active as other people my age. 2      How fit and strong are you.   Fit and Strong Total Score 34            Past Medical History:  Diagnosis  Date   Arthritis    Asthma    Breast cancer of upper-outer quadrant of right female breast (HCC) 12/23/2014   Bruises easily    CAD S/P percutaneous coronary angioplasty 01/17/2014   takes Brilinta and Plavix daily   Cataracts, bilateral    Complication of anesthesia    COPD (chronic obstructive pulmonary disease) (HCC)    Diabetes mellitus without complication (HCC)    pre DM per pt. now after weight loss surgery   no meds   Dyspnea    History of bronchitis 7-74yrs ago   History of tobacco abuse    40 pack-years   Hyperlipidemia    takes Lipitor daily   Hypertension    takes Metoprolol daily   Joint pain    Obesity    OSA treated with BiPAP    moderate with AHI 19/hr now on BIPAP auto   PONV (postoperative nausea and  vomiting)    [place]   ST elevation myocardial infarction (STEMI) of inferolateral wall (HCC) 01/17/2014   Past Surgical History:  Procedure Laterality Date   ABDOMINAL HYSTERECTOMY     APPENDECTOMY     BREAST REDUCTION SURGERY Left 01/27/2015   BREAST REDUCTION SURGERY Left 01/27/2015   Procedure: MAMMARY REDUCTION  LEFT (BREAST);  Surgeon: Glenna Fellows, MD;  Location: [facility] OR;  Service: Plastics;  Laterality: Left;   CATARACT EXTRACTION W/PHACO Right 04/11/2016   Procedure: CATARACT EXTRACTION PHACO AND INTRAOCULAR LENS PLACEMENT (IOC);  Surgeon: Jethro Bolus, MD;  [facility];  Service: Ophthalmology;  Laterality: Right;  CDE: 14.15   CATARACT EXTRACTION W/PHACO Left 05/02/2016   Procedure: CATARACT EXTRACTION PHACO AND INTRAOCULAR LENS PLACEMENT (IOC);  Surgeon: Jethro Bolus, MD;  Location: [facility];  Service: Ophthalmology;  Laterality: Left;  CDE: 12.11   CHOLECYSTECTOMY     COLONOSCOPY     CYSTOSCOPY/URETEROSCOPY/HOLMIUM LASER/STENT PLACEMENT Right 07/24/2023   Procedure: RIGHT URETEROSCOPY/HOLMIUM LASER/STENT PLACEMENT;  Surgeon: Despina Arias, MD;  [facility];  Service: Urology;  Laterality: Right;   EYE SURGERY     LEFT HEART CATH Bilateral  01/17/2014   Procedure: LEFT HEART CATH;  Surgeon: Runell Gess, MD;  Location: [facility] CATH LAB;  Service: Cardiovascular;  Laterality: Bilateral;   MASTECTOMY W/ SENTINEL NODE BIOPSY Right 01/27/2015   dr Ezzard Standing    MASTECTOMY W/ SENTINEL NODE BIOPSY Right 01/27/2015   Procedure: RIGHT MASTECTOMY WITH RIGHT AXILLARY  SENTINEL LYMPH NODE BIOPSY;  Surgeon: Ovidio Kin, MD;  [facility]: [facility] OR;  Service: General;  Laterality: Right;   PERCUTANEOUS CORONARY STENT INTERVENTION (PCI-S)     PORTACATH PLACEMENT Left 03/23/2015   Procedure: INSERTION PORT-A-CATH ;  Surgeon: Ovidio Kin, MD;  [facility]: [facility];  Service: General;  Laterality: Left;   sleeve gastrectomy     SPINAL FUSION     TOTAL KNEE ARTHROPLASTY Left 10/13/2019   Procedure: TOTAL KNEE ARTHROPLASTY;  Surgeon: Dannielle Huh, MD;  [facility]: [facility];  Service: Orthopedics;  Laterality: Left;  75 mins needed for length of case.   TOTAL SHOULDER ARTHROPLASTY Left 04/19/2017   Procedure: TOTAL SHOULDER ARTHROPLASTY;  Surgeon: Jones Broom, MD;  [facility] OR;  Service: Orthopedics;  Laterality: Left;  Left total shoulder arthroplasty   TRIGGER FINGER RELEASE Right 10/13/2021   Procedure: RELEASE TRIGGER FINGER/A-1 PULLEY RIGHT MIDDLE FINGER;  Surgeon: Cindee Salt, MD;  Location: [facility];  Service: Orthopedics;  Laterality: Right;   TUBAL LIGATION     Social History   Tobacco Use  Smoking Status Former   Current packs/day: 0.00   Average packs/day: 1 pack/day for 40.0 years (40.0 ttl pk-yrs)   Types: Cigarettes   Start date: 07/30/1969   Quit date: 07/30/2009   Years since quitting: 14.0  Smokeless Tobacco Former  Tobacco Comments   quit 5 yrs ago  To begin PREP class at [facility] on October 1, every T/Th 10-11:15  Takota Cahalan B Reighlynn Swiney 08/02/2023, 10:26 AM

## 2023-08-02 NOTE — Addendum Note (Signed)
Addended by: Alver Sorrow on: 08/02/2023 04:37 PM   Modules accepted: Orders

## 2023-08-03 ENCOUNTER — Ambulatory Visit: Payer: Medicare Other

## 2023-08-07 NOTE — Progress Notes (Signed)
[facility] PREP Weekly Session  Patient Details  Name: Mariah Kelley MRN: [id number] Date of Birth: [birth year] Age: 72 y.o. PCP: Mila Palmer, MD  There were no vitals filed for this visit.   [facility] Weekly seesion - 08/07/23 1100       [facility] "PREP" Location   [facility] "PREP" Location [name]      Weekly Session   Topic Discussed Goal setting and welcome to the program   Introductions, review of PREP notebook, tour of facility; option to workout on cardio machine   Classes attended to date 1             [name] B [name] 08/07/2023, 11:46 AM

## 2023-08-10 ENCOUNTER — Ambulatory Visit: Payer: Medicare Other

## 2023-08-10 ENCOUNTER — Inpatient Hospital Stay (HOSPITAL_COMMUNITY): Admission: RE | Admit: 2023-08-10 | Payer: Medicare Other | Source: Ambulatory Visit

## 2023-08-10 VITALS — BP 143/70 | HR 71 | Temp 97.4°F | Resp 18 | Ht 67.0 in | Wt 264.0 lb

## 2023-08-10 DIAGNOSIS — I252 Old myocardial infarction: Secondary | ICD-10-CM

## 2023-08-10 DIAGNOSIS — E785 Hyperlipidemia, unspecified: Secondary | ICD-10-CM

## 2023-08-10 DIAGNOSIS — I2510 Atherosclerotic heart disease of native coronary artery without angina pectoris: Secondary | ICD-10-CM

## 2023-08-10 MED ORDER — INCLISIRAN SODIUM 284 MG/1.5ML ~~LOC~~ SOSY
284.0000 mg | PREFILLED_SYRINGE | Freq: Once | SUBCUTANEOUS | Status: AC
  Administered 2023-08-10: 284 mg via SUBCUTANEOUS
  Filled 2023-08-10: qty 1.5

## 2023-08-10 NOTE — Progress Notes (Signed)
Diagnosis: Hyperlipidemia  Provider:  Chilton Greathouse MD  Procedure: Injection  Leqvio (inclisiran), Dose: 284 mg, Site: subcutaneous, Number of injections: 1  Post Care: Patient declined observation  Discharge: Condition: Good, Destination: Home . AVS Declined  Performed by:  Garnette Czech, RN

## 2023-08-13 ENCOUNTER — Ambulatory Visit (HOSPITAL_BASED_OUTPATIENT_CLINIC_OR_DEPARTMENT_OTHER): Payer: Medicare Other

## 2023-08-13 DIAGNOSIS — I10 Essential (primary) hypertension: Secondary | ICD-10-CM | POA: Diagnosis not present

## 2023-08-14 NOTE — Progress Notes (Signed)
[facility] PREP Weekly Session  Patient Details  Name: Mariah Kelley MRN: [id number] Date of Birth: [birth year] Age: 72 y.o. PCP: Mila Palmer, MD  Vitals:   08/14/23 1127  Weight: 260 lb 12.8 oz (118.3 kg)     [facility] Weekly seesion - 08/14/23 1100       [facility] "PREP" Location   [facility] "PREP" Location [name]      Weekly Session   Topic Discussed Importance of resistance training;Other ways to be active   Goal: work up to 150 min/wk of cardio and strength training 2-3 times/wk for 20-40 minutes   Classes attended to date 3             [name] 08/14/2023, 11:28 AM

## 2023-08-21 NOTE — Progress Notes (Signed)
[facility] PREP Weekly Session  Patient Details  Name: Mariah Kelley MRN: [id number] Date of Birth: Sep 01, 1951 Age: 72 y.o. PCP: Mila Palmer, MD  Vitals:   08/21/23 1133  Weight: 263 lb 6.4 oz (119.5 kg)     [facility] Weekly seesion - 08/21/23 1100       [facility] "PREP" Location   [facility] "PREP" Location [name]      Weekly Session   Topic Discussed Healthy eating tips   Foods to reduce, foods to increase; introduced YUKA app; eat the rainbow of colors   Minutes exercised this week 250 minutes    Classes attended to date 5             Kimba Lottes B [name] 08/21/2023, 11:33 AM

## 2023-08-28 NOTE — Progress Notes (Signed)
[facility] PREP Weekly Session  Patient Details  Name: Mariah Kelley MRN: [id number] Date of Birth: Sep 09, 1951 Age: 72 y.o. PCP: Mila Palmer, MD  Vitals:   08/28/23 1128  Weight: 264 lb (119.7 kg)     [facility] Weekly seesion - 08/28/23 1100       [facility] "PREP" Location   [facility] "PREP" Location [name]      Weekly Session   Topic Discussed Health habits   Sugar demo; limit added sugars to 24 gm/day for women and 36gm/day for men; asked to come on Thursday for strength training on the machines in wellness center.   Minutes exercised this week 265 minutes    Classes attended to date 7             Joselle Deeds B Luke Falero 08/28/2023, 11:29 AM

## 2023-09-04 NOTE — Progress Notes (Signed)
[facility] PREP Weekly Session  Patient Details  Name: Mariah Kelley MRN: [id number] Date of Birth: 23-Jun-1951 Age: 72 y.o. PCP: Mila Palmer, MD  Vitals:   09/04/23 1125  Weight: 260 lb 6.4 oz (118.1 kg)     [facility] Weekly seesion - 09/04/23 1100       [facility] "PREP" Location   [facility] "PREP" Location [name]      Weekly Session   Topic Discussed Restaurant Eating   Salt demo: limit Na intake to 1500-2300 mg/day   Minutes exercised this week 130 minutes    Classes attended to date 8             Mariah Kelley Mariah Kelley 09/04/2023, 11:26 AM

## 2023-09-07 ENCOUNTER — Other Ambulatory Visit (INDEPENDENT_AMBULATORY_CARE_PROVIDER_SITE_OTHER): Payer: Self-pay

## 2023-09-10 DIAGNOSIS — N201 Calculus of ureter: Secondary | ICD-10-CM | POA: Diagnosis not present

## 2023-09-11 NOTE — Progress Notes (Signed)
[facility] PRE[facility] Weekly Session  Patient Details  Name: Mariah Kelley MRN: [id number] Date of Birth: 09/17/1951 Age: 72 y.o. PCP: Mila Palmer, MD  Vitals:   09/11/23 1129  Weight: 258 lb 3.2 oz (117.1 kg)     [facility] Weekly seesion - 09/11/23 1100       [facility] "PREP" Location   [facility] "PREP" Location [name]      Weekly Session   Topic Discussed Stress management and problem solving   finger tip mudra breathwork to reduce stress; importance of sleep, 7-9 hrs/night; baby to bed concept; importance of using CPAP w/OSA   Minutes exercised this week 165 minutes    Classes attended to date 10             Albertus Chiarelli B Kariana Wiles 09/11/2023, 11:31 AM

## 2023-09-18 NOTE — Progress Notes (Signed)
[facility] PRE[facility] Weekly Session  Patient Details  Name: Mariah Kelley MRN: [id number] Date of Birth: 1951-10-25 Age: 72 y.o. PCP: Mila Palmer, MD  Vitals:   09/18/23 1127  Weight: 260 lb 1.6 oz (118 kg)     [facility] Weekly seesion - 09/18/23 1100       [facility] "PREP" Location   [facility] "PREP[facility] Location [name]      Weekly Session   Topic Discussed Expectations and non-scale victories   Halfway through program, reivew, reivist, restate goals; Staying positive--what's your WHY and WHO are you doing this for?   Minutes exercised this week 270 minutes    Classes attended to date 36             Viola Placeres B Neave Lenger 09/18/2023, 11:28 AM

## 2023-09-25 NOTE — Progress Notes (Signed)
[facility] PRE[facility] Weekly Session  Patient Details  Name: Mariah Kelley MRN: [id number] Date of Birth: 09/24/1951 Age: 72 y.o. PCP: Mila Palmer, MD  Vitals:   09/25/23 1057  Weight: 257 lb 12.8 oz (116.9 kg)     [facility] Weekly seesion - 09/25/23 1000       [facility] "PREP" Location   [facility]PREP[facility] Location [name]      Weekly Session   Topic Discussed Water;Other;Eating for the season   Managing Holiday Eating   Minutes exercised this week 265 minutes    Classes attended to date 17             Tatem Holsonback B Markus Casten 09/25/2023, 10:59 AM

## 2023-10-07 ENCOUNTER — Other Ambulatory Visit (INDEPENDENT_AMBULATORY_CARE_PROVIDER_SITE_OTHER): Payer: Self-pay

## 2023-10-11 NOTE — Progress Notes (Signed)
[facility] PRE[facility] Weekly Session  Patient Details  Name: Mariah Kelley MRN: [id number] Date of Birth: 1951/02/28 Age: 72 y.o. PCP: Mila Palmer, MD  There were no vitals filed for this visit.   [facility] Weekly seesion - 10/11/23 1100       [facility] "PREP" Location   [facility] "PREP" Location [name]      Weekly Session   Topic Discussed Other   Portion control; visualize your portion size demo; review of [employer] food/nutrition label.   Classes attended to date 2             Patrick Salemi B Mariah Kelley 10/11/2023, 11:01 AM

## 2023-10-13 NOTE — Progress Notes (Unsigned)
Office Visit    Patient Name: Mariah Kelley Date of Encounter: 10/15/2023  Primary Care Provider:  Mila Palmer, MD Primary Cardiologist:  Nanetta Batty, MD  Chief Complaint    Hypertension - Advanced hypertension clinic  Past Medical History   HLD 9/24 LDL 26 on inclisiran  CAD 6/21 STEMI, DES to mRCA  OSA On BIPAP  Bariatric surgery 2020  DM2 9/24 A1c 7.1    Allergies  Allergen Reactions   Prednisone Other (See Comments)    "made poison ivy worse", rebound effect when take prednisone   Codeine Nausea Only and Other (See Comments)    HEADACHE    History of Present Illness    Mariah Kelley is a 72 y.o. female patient who was referred to the Advanced [facility]pertension Clinic after a visit with Azalee Course PA and a pressure of 210/108.  She was started on amlodipine 5 mg daily then saw Gillian Shields NP.  In the interim she had a right ureteroscopy and pressure was noted to be elevated at that time.  When she saw Luther Parody, her pressure was 124/68, although initially 159/75.   Secondary workup was unremarkable.  Metoprolol tartrate was switched to succinate for easier dosing.  Patient was agreeable to join our RPM Vivify study and randomized to group 2.  She returns today for follow up.  States feeling well overall, is much happier with the once daily metoprolol dose.  She does note that she sometimes forgets to take her home BP device to work, for morning readings 2 hours after meds  Blood Pressure Goal:  130/80  Current Medications: amlodipine 5 mg every day, metoprolol succ 50 mg every day,    Family Hx:   mother had uncontrolled hypertension most of her life, had first MI in 65's, died at 28; maternal grandparents had MI < 68; father had heart disease - cholesterol; 2 brothers - 1 died from heart disease at 62, other doing well; 3 children healthy, athletic  Social Hx:      Tobacco: quit 2010  Alcohol:  no  Caffeine:  occaional tea with caffeine or [place]ew  Diet: only out 1-2 times per month, avoids processed foods when able , takes lunch from home when able; more poultry and fish; some vegetables - more carrots, broccoli, salads; snacks on fruit, lots of bananas and apples    Exercise: 3 times per week going to PREP, will continue afterward, mix of cardio and strength, [facility]  Home BP readings:   14 day average  142/71 HR 67  (range 128-160/62-86) 30 day average  141/70 HR 68  (range 127-164/57-86)     Accessory Clinical Findings    Lab Results  Component Value Date   CREATININE 1.00 07/18/2023   BUN 16 07/18/2023   NA 146 (H) 07/18/2023   K 5.1 07/18/2023   CL 107 (H) 07/18/2023   CO2 25 07/18/2023   Lab Results  Component Value Date   ALT 27 07/14/2023   AST 44 (H) 07/14/2023   ALKPHOS 98 07/14/2023   BILITOT 1.8 (H) 07/14/2023   Lab Results  Component Value Date   HGBA1C 7.1 (H) 07/18/2023    Screening for Secondary Hypertension:      07/24/2023    5:00 PM  Causes  Drugs/Herbals Screened  Renovascular HTN Screened  Sleep Apnea Screened     - Comments wearing BIPAP regularly  Thyroid Disease Screened  Hyperaldosteronism Screened  Pheochromocytoma  Screened     - Comments 07/2023 normal adrenals on CT  Compliance Screened    Relevant Labs/Studies:    Latest Ref Rng & Units 07/18/2023    4:41 PM 07/14/2023   11:19 AM 07/14/2023   11:11 AM  Basic Labs  Sodium 134 - 144 mmol/L 146  135  138   Potassium 3.5 - 5.2 mmol/L 5.1  8.0  4.6   Creatinine 0.57 - 1.00 mg/dL 1.32  4.40  1.02        Latest Ref Rng & Units 07/26/2023    3:07 PM 01/17/2014    3:39 PM  Thyroid   TSH 0.450 - 4.500 uIU/mL 1.130  1.318        Latest Ref Rng & Units 07/26/2023    3:07 PM  Renin/Aldosterone   Aldosterone 0.0 - 30.0 ng/dL 4.1   Aldos/Renin Ratio 0.0 - 30.0 3.6              08/13/2023    1:40 PM  Renovascular   Renal Artery [place] Completed Yes      Home Medications    Current  Outpatient Medications  Medication Sig Dispense Refill   amLODipine (NORVASC) 5 MG tablet Take 1 tablet (5 mg total) by mouth daily. 180 tablet 3   aspirin 81 MG chewable tablet Chew 81 mg by mouth daily.     atorvastatin (LIPITOR) 80 MG tablet TAKE 1 TABLET BY MOUTH DAILY AT 6 PM. 90 tablet 1   Calcium Carbonate-Vitamin D (CALCIUM-D PO) Take 2 tablets by mouth daily.     cholecalciferol (VITAMIN D3) 25 MCG (1000 UNIT) tablet Take 1,000 Units by mouth daily.     inclisiran (LEQVIO) 284 MG/1.5ML SOSY injection Inject 284 mg into the skin every 6 (six) months.     metoprolol succinate (TOPROL-XL) 50 MG 24 hr tablet Take 1 tablet (50 mg total) by mouth daily. Take with or immediately following a meal. 90 tablet 3   nitroGLYCERIN (NITROSTAT) 0.4 MG SL tablet PLACE 1 TABLET UNDER THE TONGUE EVERY 5 MINUTES AS NEEDED FOR CHEST PAIN. 25 tablet 3   No current facility-administered medications for this visit.     Assessment & Plan       Essential hypertension Assessment: BP is controlled in office BP 127/78 mmHg;. Home average still elevated, doing morning readings at work often Tolerates amlodipine and metoprolol succ well without any side effects Denies SOB, palpitation, chest pain, headaches,or swelling Reiterated the importance of regular exercise and low salt diet   Plan:  Continue taking amlodipine 5 mg daily and metoprolol succ 50 mg daily Patient to keep record of BP readings with heart rate through Vivify Patient to follow up with Dr. Duke Salvia on January 31 at 8 am  Labs ordered today:  none   Phillips Hay PharmD CPP [facility]  [address] [place], [place] [address] [phone]

## 2023-10-15 ENCOUNTER — Encounter (HOSPITAL_BASED_OUTPATIENT_CLINIC_OR_DEPARTMENT_OTHER): Payer: Self-pay | Admitting: Pharmacist Clinician (PhC)/ Clinical Pharmacy Specialist

## 2023-10-15 ENCOUNTER — Ambulatory Visit (INDEPENDENT_AMBULATORY_CARE_PROVIDER_SITE_OTHER): Payer: Medicare Other | Admitting: Pharmacist Clinician (PhC)/ Clinical Pharmacy Specialist

## 2023-10-15 VITALS — BP 127/78 | HR 74

## 2023-10-15 DIAGNOSIS — I10 Essential (primary) hypertension: Secondary | ICD-10-CM | POA: Diagnosis not present

## 2023-10-15 NOTE — Assessment & Plan Note (Signed)
Assessment: BP is controlled in office BP 127/78 mmHg;. Home average still elevated, doing morning readings at work often Tolerates amlodipine and metoprolol succ well without any side effects Denies SOB, palpitation, chest pain, headaches,or swelling Reiterated the importance of regular exercise and low salt diet   Plan:  Continue taking amlodipine 5 mg daily and metoprolol succ 50 mg daily Patient to keep record of BP readings with heart rate through Vivify Patient to follow up with Dr. Duke Salvia on January 31 at 8 am  Labs ordered today:  none

## 2023-10-15 NOTE — Patient Instructions (Signed)
Follow up appointment: with Dr. Duke Salvia on January 31 at 8 am  Take your BP meds as follows: no changes to medications today  Check your blood pressure at home daily (if able) and keep record of the readings.  Your blood pressure goal is < 130/80  To check your pressure at home you will need to:  1. Sit up in a chair, with feet flat on the floor and back supported. Do not cross your ankles or legs. 2. Rest your left arm so that the cuff is about heart level. If the cuff goes on your upper arm,  then just relax the arm on the table, arm of the chair or your lap. If you have a wrist cuff, we  suggest relaxing your wrist against your chest (think of it as Pledging the Flag with the  wrong arm).  3. Place the cuff snugly around your arm, about 1 inch above the crook of your elbow. The  cords should be inside the groove of your elbow.  4. Sit quietly, with the cuff in place, for about 5 minutes. After that 5 minutes press the power  button to start a reading. 5. Do not talk or move while the reading is taking place.  6. Record your readings on a sheet of paper. Although most cuffs have a memory, it is often  easier to see a pattern developing when the numbers are all in front of you.  7. You can repeat the reading after 1-3 minutes if it is recommended  Make sure your bladder is empty and you have not had caffeine or tobacco within the last 30 min  Always bring your blood pressure log with you to your appointments. If you have not brought your monitor in to be double checked for accuracy, please bring it to your next appointment.  You can find a list of quality blood pressure cuffs at [email or web address]  Important lifestyle changes to control high blood pressure  Intervention  Effect on the BP  Lose extra pounds and watch your waistline Weight loss is one of the most effective lifestyle changes for controlling blood pressure. If you're overweight or obese, losing even a small amount of  weight can help reduce blood pressure. Blood pressure might go down by about 1 millimeter of mercury (mm Hg) with each kilogram (about 2.2 pounds) of weight lost.  Exercise regularly As a general goal, aim for at least 30 minutes of moderate physical activity every day. Regular physical activity can lower high blood pressure by about 5 to 8 mm Hg.  Eat a healthy diet Eating a diet rich in whole grains, fruits, vegetables, and low-fat dairy products and low in saturated fat and cholesterol. A healthy diet can lower high blood pressure by up to 11 mm Hg.  Reduce salt (sodium) in your diet Even a small reduction of sodium in the diet can improve heart health and reduce high blood pressure by about 5 to 6 mm Hg.  Limit alcohol One drink equals 12 ounces of beer, 5 ounces of wine, or 1.5 ounces of 80-proof liquor.  Limiting alcohol to less than one drink a day for women or two drinks a day for men can help lower blood pressure by about 4 mm Hg.   If you have any questions or concerns please use My Chart to send questions or call the office at [phone]

## 2023-10-16 NOTE — Progress Notes (Signed)
[facility] PREP Weekly Session  Patient Details  Name: Mariah Kelley MRN: [id number] Date of Birth: [birth year] Age: 72 y.o. PCP: Mila Palmer, MD  Vitals:   10/16/23 1118  Weight: 258 lb (117 kg)     [facility] Weekly seesion - 10/16/23 1100       [facility] "PREP" Location   [facility] "PREP" Location [name]      Weekly Session   Topic Discussed Calorie breakdown   Carbs, proteins and fats---USDA recommendattions; simple vs. comlex carbs; [facility] membership talk w/Nick   Minutes exercised this week 280 minutes    Classes attended to date 45             Emmitt Matthews B Ticia Virgo 10/16/2023, 11:20 AM

## 2023-10-23 NOTE — Progress Notes (Signed)
[facility] PREP Weekly Session  Patient Details  Name: Mariah Kelley MRN: [id number] Date of Birth: 1951/04/27 Age: 72 y.o. PCP: Mila Palmer, MD  Vitals:   10/23/23 1141  Weight: 260 lb (117.9 kg)     [facility] Weekly seesion - 10/23/23 1100       [facility] "PREP" Location   [facility] "PREP" Location [name]      Weekly Session   Topic Discussed Other   How fit and strong survey completed; fit testing completed; final assessment visits scheduled for Thursday, review of documents to bring to that visit.   Minutes exercised this week 235 minutes    Classes attended to date 53             [name] B [name] 10/23/2023, 11:43 AM

## 2023-10-25 NOTE — Progress Notes (Signed)
[facility] PREP Evaluation  Patient Details  Name: Mariah Kelley MRN: [id number] Date of Birth: January 24, 1951 Age: 72 y.o. PCP: Mila Palmer, MD  Vitals:   10/25/23 0957  BP: (!) 146/82  Pulse: 83  SpO2: 95%  Weight: 261 lb (118.4 kg)     [facility] Eval - 10/25/23 0900       [facility] "PREP" Location   [facility] "PREP" Location [name]      Referral    Program Start Date 08/07/23    Program End Date 10/25/23      Measurement   Waist Circumference 51.5 inches    Waist Circumference End Program 48.5 inches    Hip Circumference 55 inches    Hip Circumference End Program 55 inches    Body fat 40.9 percent      Mobility and Daily Activities   I find it easy to walk up or down two or more flights of stairs. 1    I have no trouble taking out the trash. 4    I do housework such as vacuuming and dusting on my own without difficulty. 4    I can easily lift a gallon of milk (8lbs). 4    I can easily walk a mile. 3    I have no trouble reaching into high cupboards or reaching down to pick up something from the floor. 3    I do not have trouble doing out-door work such as Loss adjuster, chartered, raking leaves, or gardening. 2      Mobility and Daily Activities   I feel younger than my age. 3    I feel independent. 4    I feel energetic. 2    I live an active life.  2    I feel strong. 3    I feel healthy. 3    I feel active as other people my age. 3      How fit and strong are you.   Fit and Strong Total Score 41            Past Medical History:  Diagnosis Date   Arthritis    Asthma    Breast cancer of upper-outer quadrant of right female breast (HCC) 12/23/2014   Bruises easily    CAD S/P percutaneous coronary angioplasty 01/17/2014   takes Brilinta and Plavix daily   Cataracts, bilateral    Complication of anesthesia    COPD (chronic obstructive pulmonary disease) (HCC)    Diabetes mellitus without complication (HCC)    pre DM per pt. now after weight loss surgery    no meds   Dyspnea    History of bronchitis 7-8yrs ago   History of tobacco abuse    40 pack-years   Hyperlipidemia    takes Lipitor daily   Hypertension    takes Metoprolol daily   Joint pain    Obesity    OSA treated with BiPAP    moderate with AHI 19/hr now on BIPAP auto   PONV (postoperative nausea and vomiting)    [place]   ST elevation myocardial infarction (STEMI) of inferolateral wall (HCC) 01/17/2014   Past Surgical History:  Procedure Laterality Date   ABDOMINAL HYSTERECTOMY     APPENDECTOMY     BREAST REDUCTION SURGERY Left 01/27/2015   BREAST REDUCTION SURGERY Left 01/27/2015   Procedure: MAMMARY REDUCTION  LEFT (BREAST);  Surgeon: Glenna Fellows, MD;  Location: [facility] OR;  Service: Plastics;  Laterality: Left;   CATARACT EXTRACTION W/PHACO Right 04/11/2016  Procedure: CATARACT EXTRACTION PHACO AND INTRAOCULAR LENS PLACEMENT (IOC);  Surgeon: Jethro Bolus, MD;  Location: [facility];  Service: Ophthalmology;  Laterality: Right;  CDE: 14.15   CATARACT EXTRACTION W/PHACO Left 05/02/2016   Procedure: CATARACT EXTRACTION PHACO AND INTRAOCULAR LENS PLACEMENT (IOC);  Surgeon: Jethro Bolus, MD;  Location: [facility];  Service: Ophthalmology;  Laterality: Left;  CDE: 12.11   CHOLECYSTECTOMY     COLONOSCOPY     CYSTOSCOPY/URETEROSCOPY/HOLMIUM LASER/STENT PLACEMENT Right 07/24/2023   Procedure: RIGHT URETEROSCOPY/HOLMIUM LASER/STENT PLACEMENT;  Surgeon: Despina Arias, MD;  Location: [facility];  Service: Urology;  Laterality: Right;   EYE SURGERY     LEFT HEART CATH Bilateral 01/17/2014   Procedure: LEFT HEART CATH;  Surgeon: Runell Gess, MD;  Location: [facility] CATH LAB;  Service: Cardiovascular;  Laterality: Bilateral;   MASTECTOMY W/ SENTINEL NODE BIOPSY Right 01/27/2015   dr Ezzard Standing    MASTECTOMY W/ SENTINEL NODE BIOPSY Right 01/27/2015   Procedure: RIGHT MASTECTOMY WITH RIGHT AXILLARY  SENTINEL LYMPH NODE BIOPSY;  Surgeon: Ovidio Kin, MD;  Location: [facility] OR;  Service: General;  Laterality:  Right;   PERCUTANEOUS CORONARY STENT INTERVENTION (PCI-S)     PORTACATH PLACEMENT Left 03/23/2015   Procedure: INSERTION PORT-A-CATH ;  Surgeon: Ovidio Kin, MD;  Location: [facility];  Service: General;  Laterality: Left;   sleeve gastrectomy     SPINAL FUSION     TOTAL KNEE ARTHROPLASTY Left 10/13/2019   Procedure: TOTAL KNEE ARTHROPLASTY;  Surgeon: Dannielle Huh, MD;  Location: [facility];  Service: Orthopedics;  Laterality: Left;  75 mins needed for length of case.   TOTAL SHOULDER ARTHROPLASTY Left 04/19/2017   Procedure: TOTAL SHOULDER ARTHROPLASTY;  Surgeon: Jones Broom, MD;  Location: [facility];  Service: Orthopedics;  Laterality: Left;  Left total shoulder arthroplasty   TRIGGER FINGER RELEASE Right 10/13/2021   Procedure: RELEASE TRIGGER FINGER/A-1 PULLEY RIGHT MIDDLE FINGER;  Surgeon: Cindee Salt, MD;  Location: [facility];  Service: Orthopedics;  Laterality: Right;   TUBAL LIGATION     Social History   Tobacco Use  Smoking Status Former   Current packs/day: 0.00   Average packs/day: 1 pack/day for 40.0 years (40.0 ttl pk-yrs)   Types: Cigarettes   Start date: 07/30/1969   Quit date: 07/30/2009   Years since quitting: 14.2  Smokeless Tobacco Former  Tobacco Comments   quit 5 yrs ago  Inches lost: 3 How fit and strong survey: 08/02/23: 34 10/25/23: 41 Educations sessions completed: 10 Workout sessions completed: 9 BP 08/02/23: 176/80 10/25/23: 146/82 Fit test: Cardio March  08/09/23: 254 10/23/23: 319 Sit to stands: 10/3: 9 12/17: 14 Bicep curls: 10/3: 25 12/17: 31  Tamkia Temples B Earlie Arciga 10/25/2023, 9:59 AM

## 2023-11-07 ENCOUNTER — Other Ambulatory Visit (INDEPENDENT_AMBULATORY_CARE_PROVIDER_SITE_OTHER): Payer: Self-pay

## 2023-11-20 ENCOUNTER — Telehealth: Payer: Self-pay

## 2023-11-20 NOTE — Telephone Encounter (Signed)
 Auth Submission: NO AUTH NEEDED Site of care: Site of care: [facility] Payer: Medicare A/B with [employer] supplement Medication & CPT/J Code(s) submitted: Leqvio  (Inclisiran) J1306 Route of submission (phone, fax, portal):  Phone # Fax # Auth type: Buy/Bill PB Units/visits requested: 284mg  x 2 doses Reference number:  Approval from: 08/10/23 to 12/06/24

## 2023-12-07 ENCOUNTER — Encounter (HOSPITAL_BASED_OUTPATIENT_CLINIC_OR_DEPARTMENT_OTHER): Payer: Self-pay | Admitting: Cardiovascular Disease

## 2023-12-07 ENCOUNTER — Ambulatory Visit (INDEPENDENT_AMBULATORY_CARE_PROVIDER_SITE_OTHER): Payer: Medicare Other | Admitting: Cardiovascular Disease

## 2023-12-07 VITALS — BP 148/80 | HR 83 | Ht 67.0 in | Wt 263.4 lb

## 2023-12-07 DIAGNOSIS — Z9861 Coronary angioplasty status: Secondary | ICD-10-CM | POA: Diagnosis not present

## 2023-12-07 DIAGNOSIS — R0609 Other forms of dyspnea: Secondary | ICD-10-CM | POA: Insufficient documentation

## 2023-12-07 DIAGNOSIS — Z87891 Personal history of nicotine dependence: Secondary | ICD-10-CM | POA: Diagnosis not present

## 2023-12-07 DIAGNOSIS — E785 Hyperlipidemia, unspecified: Secondary | ICD-10-CM

## 2023-12-07 DIAGNOSIS — I10 Essential (primary) hypertension: Secondary | ICD-10-CM

## 2023-12-07 DIAGNOSIS — I2510 Atherosclerotic heart disease of native coronary artery without angina pectoris: Secondary | ICD-10-CM | POA: Diagnosis not present

## 2023-12-07 HISTORY — DX: Other forms of dyspnea: R06.09

## 2023-12-07 MED ORDER — AMLODIPINE BESYLATE 10 MG PO TABS
10.0000 mg | ORAL_TABLET | Freq: Every day | ORAL | 3 refills | Status: AC
  Filled 2024-01-14 – 2024-03-09 (×2): qty 90, 90d supply, fill #0
  Filled 2024-06-04: qty 90, 90d supply, fill #1
  Filled 2024-09-02: qty 90, 90d supply, fill #2
  Filled 2024-12-01: qty 90, 90d supply, fill #3

## 2023-12-07 NOTE — Progress Notes (Signed)
Advanced Hypertension Clinic Follow Up:    Date:  12/07/2023   ID:  ELLSIE Kelley, DOB 1951/06/15, MRN [id number]  PCP:  Mila Palmer, MD  Cardiologist:  Nanetta Batty, MD  Nephrologist:  Referring MD: Mila Palmer, MD   CC: Hypertension  History of Present Illness:    Mariah Kelley is a 73 y.o. female with a hx of hypertension, hyperlipidemia, CAD, OSA on BiPAP, morbid obesity status post bariatric surgery, and prior tobacco abuse here for follow-up.  She first came to establish care in the Advanced Hypertension Clinic 07/2023.  She has a history of STEMI in 2015.  She underwent PCI of the RCA.  She saw Azalee Course, [place] 02/7828 for blood pressure 210/108.  She had gone for preadmission testing for kidney stone removal.  She was started on amlodipine 5 mg daily and referred to the advanced hypertension clinic.  She followed up with Gillian Shields, NP 07/2023 and her home blood pressures were labile ranging from the 90s to the 190s.  Typically blood pressures at home were in the 120s to 130s though.  She is working to reduce sodium intake and was taking metoprolol and amlodipine.  At that visit she was switched to metoprolol succinate and referred to the prep program.  She follow-up with our pharmacist 10/2023 and home blood pressure was averaging 142/71.  It was controlled in the office.  Recently blood pressures have ranged from 125/57 to 170/88.  [name] presents with shortness of breath during exercise.  She experiences shortness of breath during exercise, particularly when walking outside on inclines or hills. Despite regular exercise, her stamina has not improved, and she feels unable to progress. Previously, she could walk five miles a day, but now struggles with even half a mile if there are hills. She can perform flat walks and engage in half an hour of cardio and strength training without issues. No chest pain or pressure accompanies the shortness of  breath.  She is taking metoprolol and notes her heart rate is usually in the seventies, although it was eighty-three at the time of the visit. She has been using a longer-acting form of metoprolol, which she finds more effective in managing her heart rate during exercise.  Her blood pressure readings have been variable, often higher than the target of under 130/80 mmHg. She is currently on amlodipine 5 mg and metoprolol, but her blood pressure remains above the desired range. She has no history of swelling in her legs or feet.  There is a family history of high blood pressure, with her mother having died at 68 from a coronary occlusion.     Previous antihypertensives:    Past Medical History:  Diagnosis Date   Arthritis    Asthma    Breast cancer of upper-outer quadrant of right female breast (HCC) 12/23/2014   Bruises easily    CAD S/P percutaneous coronary angioplasty 01/17/2014   takes Brilinta and Plavix daily   Cataracts, bilateral    Complication of anesthesia    COPD (chronic obstructive pulmonary disease) (HCC)    Diabetes mellitus without complication (HCC)    pre DM per pt. now after weight loss surgery   no meds   Dyspnea    Exertional dyspnea 12/07/2023   History of bronchitis 7-68yrs ago   History of tobacco abuse    40 pack-years   Hyperlipidemia    takes Lipitor daily   Hypertension    takes Metoprolol daily   Joint pain  Obesity    OSA treated with BiPAP    moderate with AHI 19/hr now on BIPAP auto   PONV (postoperative nausea and vomiting)    [place]   ST elevation myocardial infarction (STEMI) of inferolateral wall (HCC) 01/17/2014    Past Surgical History:  Procedure Laterality Date   ABDOMINAL HYSTERECTOMY     APPENDECTOMY     BREAST REDUCTION SURGERY Left 01/27/2015   BREAST REDUCTION SURGERY Left 01/27/2015   Procedure: MAMMARY REDUCTION  LEFT (BREAST);  Surgeon: Glenna Fellows, MD;  Location: [facility] OR;  Service: Plastics;  Laterality: Left;    CATARACT EXTRACTION W/PHACO Right 04/11/2016   Procedure: CATARACT EXTRACTION PHACO AND INTRAOCULAR LENS PLACEMENT (IOC);  Surgeon: Jethro Bolus, MD;  Location: [facility] ORS;  Service: Ophthalmology;  Laterality: Right;  CDE: 14.15   CATARACT EXTRACTION W/PHACO Left 05/02/2016   Procedure: CATARACT EXTRACTION PHACO AND INTRAOCULAR LENS PLACEMENT (IOC);  Surgeon: Jethro Bolus, MD;  Location: [facility] ORS;  Service: Ophthalmology;  Laterality: Left;  CDE: 12.11   CHOLECYSTECTOMY     COLONOSCOPY     CYSTOSCOPY/URETEROSCOPY/HOLMIUM LASER/STENT PLACEMENT Right 07/24/2023   Procedure: RIGHT URETEROSCOPY/HOLMIUM LASER/STENT PLACEMENT;  Surgeon: Despina Arias, MD;  Location: [facility] ORS;  Service: Urology;  Laterality: Right;   EYE SURGERY     LEFT HEART CATH Bilateral 01/17/2014   Procedure: LEFT HEART CATH;  Surgeon: Runell Gess, MD;  Location: [facility] CATH LAB;  Service: Cardiovascular;  Laterality: Bilateral;   MASTECTOMY W/ SENTINEL NODE BIOPSY Right 01/27/2015   dr Ezzard Standing    MASTECTOMY W/ SENTINEL NODE BIOPSY Right 01/27/2015   Procedure: RIGHT MASTECTOMY WITH RIGHT AXILLARY  SENTINEL LYMPH NODE BIOPSY;  Surgeon: Ovidio Kin, MD;  Location: [facility] OR;  Service: General;  Laterality: Right;   PERCUTANEOUS CORONARY STENT INTERVENTION (PCI-S)     PORTACATH PLACEMENT Left 03/23/2015   Procedure: INSERTION PORT-A-CATH ;  Surgeon: Ovidio Kin, MD;  Location: [facility];  Service: General;  Laterality: Left;   sleeve gastrectomy     SPINAL FUSION     TOTAL KNEE ARTHROPLASTY Left 10/13/2019   Procedure: TOTAL KNEE ARTHROPLASTY;  Surgeon: Dannielle Huh, MD;  Location: [facility] OR[facility];  Service: Orthopedics;  Laterality: Left;  75 mins needed for length of case.   TOTAL SHOULDER ARTHROPLASTY Left 04/19/2017   Procedure: TOTAL SHOULDER ARTHROPLASTY;  Surgeon: Jones Broom, MD;  Location: [facility] OR;  Service: Orthopedics;  Laterality: Left;  Left total shoulder arthroplasty   TRIGGER FINGER RELEASE Right 10/13/2021   Procedure: RELEASE TRIGGER  FINGER/A-1 PULLEY RIGHT MIDDLE FINGER;  Surgeon: Cindee Salt, MD;  Location: [facility];  Service: Orthopedics;  Laterality: Right;   TUBAL LIGATION      Current Medications: No outpatient medications have been marked as taking for the 12/07/23 encounter (Office Visit) with Chilton Si, MD.     Allergies:   Prednisone and Codeine   Social History   Socioeconomic History   Marital status: Single    Spouse name: Not on file   Number of children: Not on file   Years of education: Not on file   Highest education level: Not on file  Occupational History   Occupation: Social Worker  Tobacco Use   Smoking status: Former    Current packs/day: 0.00    Average packs/day: 1 pack/day for 40.0 years (40.0 ttl pk-yrs)    Types: Cigarettes    Start date: 07/30/1969    Quit date: 07/30/2009    Years since quitting: 14.3   Smokeless tobacco: Former  Tobacco comments:    quit 5 yrs ago  Vaping Use   Vaping status: Never Used  Substance and Sexual Activity   Alcohol use: No   Drug use: No   Sexual activity: Not Currently    Birth control/protection: Surgical  Other Topics Concern   Not on file  Social History Narrative   Divorced, 2 children.    Social Drivers of Corporate investment banker Strain: Low Risk  (07/26/2023)   Overall Financial Resource Strain (CARDIA)    Difficulty of Paying Living Expenses: Not hard at all  Food Insecurity: No Food Insecurity (07/26/2023)   Hunger Vital Sign    Worried About Running Out of Food in the Last Year: Never true    Ran Out of Food in the Last Year: Never true  Transportation Needs: No Transportation Needs (07/26/2023)   PRAPARE - Administrator, Civil Service (Medical): No    Lack of Transportation (Non-Medical): No  Physical Activity: Inactive (07/26/2023)   Exercise Vital Sign    Days of Exercise per Week: 0 days    Minutes of Exercise per Session: 0 min  Stress: Not on file  Social Connections: Not on  file     Family History: The patient's family history includes CAD in her brother and brother; Heart attack (age of onset: 59) in her mother; Heart attack (age of onset: 68) in her father; Hypertension in her maternal grandmother and mother.  ROS:   Please see the history of present illness.     All other systems reviewed and are negative.  EKGs/Labs/Other Studies Reviewed:    EKG:  EKG is not ordered today.    Recent Labs: 07/14/2023: ALT 27; Hemoglobin 11.7; Platelets 273 07/18/2023: BUN 16; Creatinine, Ser 1.00; Potassium 5.1; Sodium 146 07/26/2023: TSH 1.130   Recent Lipid Panel    Component Value Date/Time   CHOL 132 10/16/2018 0822   TRIG 120 10/16/2018 0822   HDL 51 10/16/2018 0822   CHOLHDL 2.6 10/16/2018 0822   CHOLHDL 4.7 01/18/2014 0314   VLDL 25 01/18/2014 0314   LDLCALC 57 10/16/2018 0822    Physical Exam:   VS:  BP (!) 148/80   Pulse 83   Ht 5\' 7"  (1.702 m)   Wt 263 lb 6.4 oz (119.5 kg)   SpO2 93%   BMI 41.25 kg/m  , BMI Body mass index is 41.25 kg/m. GENERAL:  Well appearing HEENT: Pupils equal round and reactive, fundi not visualized, oral mucosa unremarkable NECK:  No jugular venous distention, waveform within normal limits, carotid upstroke brisk and symmetric, no bruits, no thyromegaly LUNGS:  Clear to auscultation bilaterally HEART:  RRR.  PMI not displaced or sustained,S1 and S2 within normal limits, no S3, no S4, no clicks, no rubs, no murmurs ABD:  Flat, positive bowel sounds normal in frequency in pitch, no bruits, no rebound, no guarding, no midline pulsatile mass, no hepatomegaly, no splenomegaly EXT:  2 plus pulses throughout, no edema, no cyanosis no clubbing SKIN:  No rashes no nodules NEURO:  Cranial nerves II through XII grossly intact, motor grossly intact throughout PSYCH:  Cognitively intact, oriented to person place and time   ASSESSMENT/PLAN:    # Exercise intolerance Despite regular exercise, patient reports lack of stamina and  shortness of breath, particularly with inclines. No chest pain or pressure.  Given her history of MI and exertional dyspnea, we will pursue stress testing -Order exercise Myoview to assess for new obstructive blockage.  #  Hypertension Blood pressure readings consistently above goal of 130/80 despite current medication regimen. No reported side effects. -Increase Amlodipine from 5mg  to 10mg  daily. Monitor for potential ankle swelling. -Check blood pressure in 1-2 months to assess response to medication adjustment. -Continue exercise and metoprolol  #  CAD:  # Hyperlipidemia Well controlled on current medication. -Continue Atorvastatin and Inclisiran  Follow-up in 1-2 months to assess blood pressure control and discuss results of nuclear stress test.      Screening for Secondary Hypertension:     07/24/2023    5:00 PM  Causes  Drugs/Herbals Screened  Renovascular HTN Screened  Sleep Apnea Screened     - Comments wearing BIPAP regularly  Thyroid Disease Screened  Hyperaldosteronism Screened  Pheochromocytoma Screened     - Comments 07/2023 normal adrenals on CT  Compliance Screened    Relevant Labs/Studies:    Latest Ref Rng & Units 07/18/2023    4:41 PM 07/14/2023   11:19 AM 07/14/2023   11:11 AM  Basic Labs  Sodium 134 - 144 mmol/L 146  135  138   Potassium 3.5 - 5.2 mmol/L 5.1  8.0  4.6   Creatinine 0.57 - 1.00 mg/dL 6.38  7.56  4.33        Latest Ref Rng & Units 07/26/2023    3:07 PM 01/17/2014    3:39 PM  Thyroid   TSH 0.450 - 4.500 uIU/mL 1.130  1.318        Latest Ref Rng & Units 07/26/2023    3:07 PM  Renin/Aldosterone   Aldosterone 0.0 - 30.0 ng/dL 4.1   Aldos/Renin Ratio 0.0 - 30.0 3.6              08/13/2023    1:40 PM  Renovascular   Renal Artery [place] Completed Yes        Disposition:    FU with MD/PharmD in 2 months    Medication Adjustments/Labs and Tests Ordered: Current medicines are reviewed at length with the patient today.  Concerns  regarding medicines are outlined above.  No orders of the defined types were placed in this encounter.  No orders of the defined types were placed in this encounter.    Signed, Chilton Si, MD  12/07/2023 8:27 AM    [facility]

## 2023-12-07 NOTE — Patient Instructions (Addendum)
Medication Instructions:  INCREASE AMLODIPINE TO 10 MG DAILY   MORNING OF YOUR STRESS TEST TAKE METOPROLOL TARTRATE AND HOLD THE METOPROLOL SUCC PER DR Ranchester  Labwork: NONE   Testing/Procedures: Your physician has requested that you have en exercise stress myoview. For further information please visit [email or web address] Please follow instruction sheet, as given.  Follow-Up: 1 TO 2 MONTHS IN ADV HTN CLINIC   If you need a refill on your cardiac medications before your next appointment, please call your pharmacy.  Your Physician has requested you have a Myocardial Perfusion Imaging Study.   Please arrive 15 minutes prior to your appointment time for registration and insurance purposes.   The test will take approximately 3 to 4 hours to complete; you may bring reading material. If someone comes with you to your appointment, they will need to remain in the main lobby due to limited testing space in the testing area. **If you are pregnant or breastfeeding, please notify the nuclear lab prior to your appointment**   How to prepare for your test:  - Do not eat or drink 3 hours prior to your test, except you may have water  - Do not consume products containing caffeine ( regular or decaf) 12 hours prior to your test ( coffee, chocolate, sodas or teas)  - Do bring a list of your current medications with you. If not listed below, you may take your medications as normal  - Do wear comfortable clothes (no dresses or overalls) and walking shoes ( tennis shoes preferred), no heel or open toe shoes are allowed - Do not wear cologne, perfume, aftershave, or lotions ( deodorant is allowed)  - If these instructions are not followed, your test will be rescheduled   If you cannot keep your appointment, please provide 24 hours notice to the Nuclear Lab, to avoid a possible $50 charge to your account!

## 2023-12-08 ENCOUNTER — Other Ambulatory Visit (INDEPENDENT_AMBULATORY_CARE_PROVIDER_SITE_OTHER): Payer: Self-pay

## 2023-12-10 ENCOUNTER — Encounter: Payer: Self-pay | Admitting: Cardiovascular Disease

## 2023-12-10 ENCOUNTER — Ambulatory Visit: Payer: Medicare Other | Attending: Cardiovascular Disease | Admitting: Cardiovascular Disease

## 2023-12-10 VITALS — BP 138/86 | HR 81 | Ht 67.0 in | Wt 264.0 lb

## 2023-12-10 DIAGNOSIS — R0609 Other forms of dyspnea: Secondary | ICD-10-CM | POA: Diagnosis not present

## 2023-12-10 DIAGNOSIS — G4733 Obstructive sleep apnea (adult) (pediatric): Secondary | ICD-10-CM

## 2023-12-10 DIAGNOSIS — E785 Hyperlipidemia, unspecified: Secondary | ICD-10-CM

## 2023-12-10 DIAGNOSIS — R0989 Other specified symptoms and signs involving the circulatory and respiratory systems: Secondary | ICD-10-CM

## 2023-12-10 DIAGNOSIS — I252 Old myocardial infarction: Secondary | ICD-10-CM

## 2023-12-10 DIAGNOSIS — I10 Essential (primary) hypertension: Secondary | ICD-10-CM | POA: Diagnosis not present

## 2023-12-10 NOTE — Progress Notes (Signed)
12/10/2023 Mariah Kelley   09/12/1951  [id number]  Primary Physician Mila Palmer, MD Primary Cardiologist: Runell Gess MD Milagros Loll, [place], [place]  HPI:  Mariah Kelley is a 73 y.o.  w/ PMHx s/f HTN, HLD, prior tobacco abuse, obesity and family h/o premature CAD. I last saw her in the office 12/01/2022.  She was  admitted to [facility] today for STEMI 01/17/14.   She was in her USOH until ~2AM this morning when she experienced sudden onset substernal chest pressure radiating to her neck and arms bilaterally w/ associated nausea and vomiting. The discomfort persisted through the morning. She took an ASA 81mg  ~0600 w/ minimal relief. When the discomfort intensified to a 7-8/10, she sought medical care.  She has no prior cardiac history. She reports smoking 1 PPD x 40 years, no current tobacco abuse. Mother with MI in her 11s, father with MI at 20, brothers w/ CAD. Previously on medication for high cholesterol. No current medications.    In the ED, EKG revealed NSR w/ 2mm ST elevations in II, III, aVF and V5, V6 meeting diagnostic criteria for an inferolateral STEMI. Code STEMI was called, and she was transported emergently to the cardiac cath lab. I placed a drug-eluting stent in her dominant mid RCA for a hazy 80% plaque. The remainder of her coronary anatomy was free of significant disease in her function was preserved. She was discharged in stable condition. Since that time she has seen [place]  PA-C in the office in followup. She does deny chest pain or shortness of breath. She has lost 40 pounds as made lifestyle modifications.since I saw her back in the office a year ago she's remained clinically stable specifically denying chest pain or shortness of breath.  She was diagnosed with breast cancer underwent mastectomy one year ago and chemotherapy subsequent to that. She is currently cancer free. She exercises 4 times a week but unfortunately continues to  gain weight. She had her left shoulder replaced approximately 1 year ago.    She underwent successful bariatric surgery on 12/31/2018 by Dr. Lily Peer at [facility].  She dropped 308 down to 221 pounds and feels clinically improved.  Unfortunately, over the last year she has gained back 50 pounds.  She did have a left total knee replacement by Dr. Valentina Gu 12/20.     Since I saw her a year ago she continues to do well.  Her weight has remained stable.  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  She walks her dogs on a daily basis.  She has been seen by Dr. Chilton Si and advanced hypertension clinic for blood pressure out of control.  She is currently taking her blood pressure twice a day at home and is on a clinical trial.  She also has noticed some increasing dyspnea on exertion especially when walking up an incline.  A cardiac PET study was ordered.  Current Meds  Medication Sig   amLODipine (NORVASC) 10 MG tablet Take 1 tablet (10 mg total) by mouth daily.   aspirin 81 MG chewable tablet Chew 81 mg by mouth daily.   atorvastatin (LIPITOR) 80 MG tablet TAKE 1 TABLET BY MOUTH DAILY AT 6 PM.   Calcium Carbonate-Vitamin D (CALCIUM-D PO) Take 2 tablets by mouth daily.   cholecalciferol (VITAMIN D3) 25 MCG (1000 UNIT) tablet Take 1,000 Units by mouth  daily.   inclisiran (LEQVIO) 284 MG/1.5ML SOSY injection Inject 284 mg into the skin every 6 (six) months.   nitroGLYCERIN (NITROSTAT) 0.4 MG SL tablet PLACE 1 TABLET UNDER THE TONGUE EVERY 5 MINUTES AS NEEDED FOR CHEST PAIN.     Allergies  Allergen Reactions   Prednisone Other (See Comments)    "made poison ivy worse", rebound effect when take prednisone   Codeine Nausea Only and Other (See Comments)    HEADACHE    Social History   Socioeconomic History   Marital status: Single    Spouse name: Not on file   Number of children: Not on file   Years of  education: Not on file   Highest education level: Not on file  Occupational History   Occupation: Social Worker  Tobacco Use   Smoking status: Former    Current packs/day: 0.00    Average packs/day: 1 pack/day for 40.0 years (40.0 ttl pk-yrs)    Types: Cigarettes    Start date: 07/30/1969    Quit date: 07/30/2009    Years since quitting: 14.3   Smokeless tobacco: Former   Tobacco comments:    quit 5 yrs ago  Vaping Use   Vaping status: Never Used  Substance and Sexual Activity   Alcohol use: No   Drug use: No   Sexual activity: Not Currently    Birth control/protection: Surgical  Other Topics Concern   Not on file  Social History Narrative   Divorced, 2 children.    Social Drivers of Corporate investment banker Strain: Low Risk  (07/26/2023)   Overall Financial Resource Strain (CARDIA)    Difficulty of Paying Living Expenses: Not hard at all  Food Insecurity: No Food Insecurity (07/26/2023)   Hunger Vital Sign    Worried About Running Out of Food in the Last Year: Never true    Ran Out of Food in the Last Year: Never true  Transportation Needs: No Transportation Needs (07/26/2023)   PRAPARE - Administrator, Civil Service (Medical): No    Lack of Transportation (Non-Medical): No  Physical Activity: Inactive (07/26/2023)   Exercise Vital Sign    Days of Exercise per Week: 0 days    Minutes of Exercise per Session: 0 min  Stress: Not on file  Social Connections: Not on file  Intimate Partner Violence: Not on file     Review of Systems: General: negative for chills, fever, night sweats or weight changes.  Cardiovascular: negative for chest pain, dyspnea on exertion, edema, orthopnea, palpitations, paroxysmal nocturnal dyspnea or shortness of breath Dermatological: negative for rash Respiratory: negative for cough or wheezing Urologic: negative for hematuria Abdominal: negative for nausea, vomiting, diarrhea, bright red blood per rectum, melena, or  hematemesis Neurologic: negative for visual changes, syncope, or dizziness All other systems reviewed and are otherwise negative except as noted above.    Blood pressure 138/86, pulse 81, height 5\' 7"  (1.702 m), weight 264 lb (119.7 kg), SpO2 98%.  General appearance: alert and no distress Neck: no adenopathy, no JVD, supple, symmetrical, trachea midline, thyroid not enlarged, symmetric, no tenderness/mass/nodules, and bilateral carotid bruits Lungs: clear to auscultation bilaterally Heart: regular rate and rhythm, S1, S2 normal, no murmur, click, rub or gallop Extremities: extremities normal, atraumatic, no cyanosis or edema Pulses: 2+ and symmetric Skin: Skin color, texture, turgor normal. No rashes or lesions Neurologic: Grossly normal  EKG not performed today      ASSESSMENT AND PLAN:   History of ST  elevation myocardial infarction (STEMI) History of CAD status post inferolateral STEMI 01/17/2014.  I brought her to the Cath Lab and intervened on a high-grade RCA with a drug-eluting stent.  The remainder of her anatomy was free of significant disease.  LV function was preserved.  She remains on baby aspirin.  Dyslipidemia History of dyslipidemia on high-dose atorvastatin and inclisiran with lipid profile performed 07/30/2023 revealing total cholesterol 99, LDL 26 and HDL 47.  Essential hypertension History of essential hypertension with blood pressure measured today at 138/86.  She is followed in the advanced hypertension clinic by Dr. Duke Salvia.  She currently is on amlodipine, and metoprolol and apparently is in a "trial" being followed very carefully.  She checks her blood pressure twice a day at home.  Morbid obesity (HCC) History of morbid obesity with a BMI of 41.  She was placed on Ozempic but was intolerant to this.  OSA treated with BiPAP History of obstructive sleep apnea on CPAP.  Exertional dyspnea History of dyspnea on exertion with cardiac PET study ordered by Dr.  Duke Salvia.     Runell Gess MD FACP,FACC,FAHA, [facility] 12/10/2023 8:28 AM

## 2023-12-10 NOTE — Assessment & Plan Note (Signed)
History of obstructive sleep apnea on CPAP. 

## 2023-12-10 NOTE — Assessment & Plan Note (Signed)
History of dyslipidemia on high-dose atorvastatin and inclisiran with lipid profile performed 07/30/2023 revealing total cholesterol 99, LDL 26 and HDL 47.

## 2023-12-10 NOTE — Patient Instructions (Signed)
Medication Instructions:  Your physician recommends that you continue on your current medications as directed. Please refer to the Current Medication list given to you today.  *If you need a refill on your cardiac medications before your next appointment, please call your pharmacy*   Testing/Procedures: Your physician has requested that you have a carotid duplex. This test is an ultrasound of the carotid arteries in your neck. It looks at blood flow through these arteries that supply the brain with blood. Allow one hour for this exam. There are no restrictions or special instructions. This will take place at [address] [facility], [address].  Please note: We ask at that you not bring children with you during ultrasound (echo/ vascular) testing. Due to room size and safety concerns, children are not allowed in the ultrasound rooms during exams. Our front office staff cannot provide observation of children in our lobby area while testing is being conducted. An adult accompanying a patient to their appointment will only be allowed in the ultrasound room at the discretion of the ultrasound technician under special circumstances. We apologize for any inconvenience.    Follow-Up: At [facility], you and your health needs are our priority.  As part of our continuing mission to provide you with exceptional heart care, we have created designated Provider Care Teams.  These Care Teams include your primary Cardiologist (physician) and Advanced Practice Providers (APPs -  Physician Assistants and Nurse Practitioners) who all work together to provide you with the care you need, when you need it.  We recommend signing up for the patient portal called "MyChart".  Sign up information is provided on this After Visit Summary.  MyChart is used to connect with patients for Virtual Visits (Telemedicine).  Patients are able to view lab/test results, encounter notes, upcoming appointments, etc.  Non-urgent messages  can be sent to your provider as well.   To learn more about what you can do with MyChart, go to [email or web address].    Your next appointment:   12 month(s)  Provider:   Nanetta Batty, MD     Other Instructions

## 2023-12-10 NOTE — Assessment & Plan Note (Signed)
History of CAD status post inferolateral STEMI 01/17/2014.  I brought her to the Cath Lab and intervened on a high-grade RCA with a drug-eluting stent.  The remainder of her anatomy was free of significant disease.  LV function was preserved.  She remains on baby aspirin.

## 2023-12-10 NOTE — Assessment & Plan Note (Signed)
History of essential hypertension with blood pressure measured today at 138/86.  She is followed in the advanced hypertension clinic by Dr. Duke Salvia.  She currently is on amlodipine, and metoprolol and apparently is in a "trial" being followed very carefully.  She checks her blood pressure twice a day at home.

## 2023-12-10 NOTE — Assessment & Plan Note (Signed)
History of dyspnea on exertion with cardiac PET study ordered by Dr. Duke Salvia.

## 2023-12-10 NOTE — Assessment & Plan Note (Signed)
History of morbid obesity with a BMI of 41.  She was placed on Ozempic but was intolerant to this.

## 2023-12-12 ENCOUNTER — Telehealth (HOSPITAL_COMMUNITY): Payer: Self-pay | Admitting: *Deleted

## 2023-12-12 NOTE — Telephone Encounter (Signed)
 Left message on voicemail per DPR in reference to upcoming appointment scheduled on 12/17/2023 at 7:30 with detailed instructions given per Myocardial Perfusion Study Information Sheet for the test. LM to arrive 15 minutes early, and that it is imperative to arrive on time for appointment to keep from having the test rescheduled. If you need to cancel or reschedule your appointment, please call the office within 24 hours of your appointment. Failure to do so may result in a cancellation of your appointment, and a $50 no show fee. Phone number given for call back for any questions.

## 2023-12-17 ENCOUNTER — Ambulatory Visit (HOSPITAL_COMMUNITY): Payer: Medicare Other | Attending: Cardiovascular Disease

## 2023-12-17 DIAGNOSIS — I2510 Atherosclerotic heart disease of native coronary artery without angina pectoris: Secondary | ICD-10-CM | POA: Diagnosis not present

## 2023-12-17 DIAGNOSIS — Z9861 Coronary angioplasty status: Secondary | ICD-10-CM | POA: Diagnosis not present

## 2023-12-17 DIAGNOSIS — R0609 Other forms of dyspnea: Secondary | ICD-10-CM | POA: Insufficient documentation

## 2023-12-17 DIAGNOSIS — Z87891 Personal history of nicotine dependence: Secondary | ICD-10-CM | POA: Diagnosis not present

## 2023-12-17 DIAGNOSIS — I10 Essential (primary) hypertension: Secondary | ICD-10-CM | POA: Insufficient documentation

## 2023-12-17 DIAGNOSIS — E785 Hyperlipidemia, unspecified: Secondary | ICD-10-CM | POA: Insufficient documentation

## 2023-12-17 MED ORDER — TECHNETIUM TC 99M TETROFOSMIN IV KIT
32.8000 | PACK | Freq: Once | INTRAVENOUS | Status: AC | PRN
  Administered 2023-12-17: 32.8 via INTRAVENOUS

## 2023-12-18 ENCOUNTER — Ambulatory Visit (HOSPITAL_COMMUNITY): Payer: Medicare Other

## 2023-12-18 LAB — MYOCARDIAL PERFUSION IMAGING
Angina Index: 0
Duke Treadmill Score: 3
Estimated workload: 4.6
Exercise duration (min): 3 min
Exercise duration (sec): 24 s
LV dias vol: 123 mL (ref 46–106)
LV sys vol: 65 mL
MPHR: 147 {beats}/min
Nuc Stress EF: 47 %
Peak HR: 126 {beats}/min
Percent HR: 85 %
Rest HR: 87 {beats}/min
Rest Nuclear Isotope Dose: 32.5 mCi
SDS: 1
SRS: 1
SSS: 2
ST Depression (mm): 0 mm
Stress Nuclear Isotope Dose: 32.8 mCi
TID: 1.13

## 2023-12-18 MED ORDER — TECHNETIUM TC 99M TETROFOSMIN IV KIT
32.5000 | PACK | Freq: Once | INTRAVENOUS | Status: AC | PRN
  Administered 2023-12-18: 32.5 via INTRAVENOUS

## 2023-12-26 ENCOUNTER — Encounter (HOSPITAL_BASED_OUTPATIENT_CLINIC_OR_DEPARTMENT_OTHER): Payer: Self-pay

## 2023-12-26 DIAGNOSIS — R0609 Other forms of dyspnea: Secondary | ICD-10-CM

## 2023-12-26 DIAGNOSIS — I10 Essential (primary) hypertension: Secondary | ICD-10-CM

## 2023-12-27 NOTE — Telephone Encounter (Addendum)
-----   Message from Alver Sorrow sent at 12/26/2023  3:22 PM EST ----- Stress test low risk no evidence of ischemia. Good result!   Did show reduced heart muscle function. This may be inaccurate measurement. Recommend echocardiogram for further evaluation.   Echo ordered and message sent to scheduling to reach out to get Echo scheduled

## 2024-01-01 ENCOUNTER — Other Ambulatory Visit (HOSPITAL_BASED_OUTPATIENT_CLINIC_OR_DEPARTMENT_OTHER): Payer: Self-pay

## 2024-01-01 ENCOUNTER — Ambulatory Visit (HOSPITAL_BASED_OUTPATIENT_CLINIC_OR_DEPARTMENT_OTHER): Payer: Medicare Other

## 2024-01-01 DIAGNOSIS — I10 Essential (primary) hypertension: Secondary | ICD-10-CM

## 2024-01-01 DIAGNOSIS — R0609 Other forms of dyspnea: Secondary | ICD-10-CM

## 2024-01-01 LAB — ECHOCARDIOGRAM COMPLETE
Area-P 1/2: 4.68 cm2
S' Lateral: 3.25 cm

## 2024-01-03 ENCOUNTER — Encounter (HOSPITAL_BASED_OUTPATIENT_CLINIC_OR_DEPARTMENT_OTHER): Payer: Self-pay

## 2024-01-03 ENCOUNTER — Ambulatory Visit (HOSPITAL_COMMUNITY)
Admission: RE | Admit: 2024-01-03 | Discharge: 2024-01-03 | Disposition: A | Discharge status: Home / Self Care | Payer: Medicare Other | Source: Ambulatory Visit | Attending: Cardiology | Admitting: Cardiology

## 2024-01-03 DIAGNOSIS — R0989 Other specified symptoms and signs involving the circulatory and respiratory systems: Secondary | ICD-10-CM

## 2024-01-03 DIAGNOSIS — E785 Hyperlipidemia, unspecified: Secondary | ICD-10-CM

## 2024-01-05 ENCOUNTER — Other Ambulatory Visit (INDEPENDENT_AMBULATORY_CARE_PROVIDER_SITE_OTHER): Payer: Self-pay

## 2024-01-07 ENCOUNTER — Other Ambulatory Visit (HOSPITAL_COMMUNITY): Payer: Self-pay | Admitting: *Deleted

## 2024-01-07 DIAGNOSIS — R0989 Other specified symptoms and signs involving the circulatory and respiratory systems: Secondary | ICD-10-CM

## 2024-01-11 ENCOUNTER — Encounter (HOSPITAL_BASED_OUTPATIENT_CLINIC_OR_DEPARTMENT_OTHER): Payer: Self-pay

## 2024-01-14 ENCOUNTER — Other Ambulatory Visit (HOSPITAL_BASED_OUTPATIENT_CLINIC_OR_DEPARTMENT_OTHER): Payer: Self-pay

## 2024-01-14 ENCOUNTER — Ambulatory Visit (INDEPENDENT_AMBULATORY_CARE_PROVIDER_SITE_OTHER): Payer: Medicare Other | Admitting: Cardiovascular Disease

## 2024-01-14 ENCOUNTER — Encounter (HOSPITAL_BASED_OUTPATIENT_CLINIC_OR_DEPARTMENT_OTHER): Payer: Self-pay | Admitting: Cardiovascular Disease

## 2024-01-14 VITALS — BP 156/71 | HR 77 | Ht 67.0 in | Wt 260.0 lb

## 2024-01-14 DIAGNOSIS — Z5181 Encounter for therapeutic drug level monitoring: Secondary | ICD-10-CM

## 2024-01-14 DIAGNOSIS — Z9861 Coronary angioplasty status: Secondary | ICD-10-CM | POA: Diagnosis not present

## 2024-01-14 DIAGNOSIS — G4733 Obstructive sleep apnea (adult) (pediatric): Secondary | ICD-10-CM

## 2024-01-14 DIAGNOSIS — I10 Essential (primary) hypertension: Secondary | ICD-10-CM | POA: Diagnosis not present

## 2024-01-14 DIAGNOSIS — I2510 Atherosclerotic heart disease of native coronary artery without angina pectoris: Secondary | ICD-10-CM

## 2024-01-14 DIAGNOSIS — E785 Hyperlipidemia, unspecified: Secondary | ICD-10-CM

## 2024-01-14 MED ORDER — VALSARTAN 160 MG PO TABS
160.0000 mg | ORAL_TABLET | Freq: Every day | ORAL | 1 refills | Status: DC
  Filled 2024-01-14: qty 90, 90d supply, fill #0
  Filled 2024-04-18: qty 90, 90d supply, fill #1
  Filled 2024-04-19: qty 30, 30d supply, fill #1

## 2024-01-14 MED ORDER — EMPAGLIFLOZIN 10 MG PO TABS: 10.0000 mg | ORAL_TABLET | Freq: Every day | ORAL | Status: AC

## 2024-01-14 MED ORDER — EMPAGLIFLOZIN 10 MG PO TABS
10.0000 mg | ORAL_TABLET | Freq: Every day | ORAL | 5 refills | Status: DC
  Filled 2024-01-14 – 2024-01-27 (×2): qty 30, 30d supply, fill #0
  Filled 2024-02-22: qty 30, 30d supply, fill #1
  Filled 2024-03-23: qty 30, 30d supply, fill #2
  Filled 2024-04-25: qty 30, 30d supply, fill #3

## 2024-01-14 MED ORDER — CELECOXIB 100 MG PO CAPS
100.0000 mg | ORAL_CAPSULE | Freq: Every day | ORAL | 5 refills | Status: DC
  Filled 2024-01-14: qty 30, 30d supply, fill #0

## 2024-01-14 MED ORDER — METOPROLOL SUCCINATE ER 50 MG PO TB24
50.0000 mg | ORAL_TABLET | Freq: Every day | ORAL | 3 refills | Status: DC
Start: 2023-07-26 — End: 2024-07-17
  Filled 2024-01-14 – 2024-03-20 (×2): qty 90, 90d supply, fill #0
  Filled 2024-06-20: qty 32, 32d supply, fill #1

## 2024-01-14 NOTE — Patient Instructions (Signed)
 Medication Instructions:  START VALSARTAN 160 MG DAILY   START JARDIANCE 10 MG DAILY THIS HAS BEEN SENT TO THE PHARMACY, ONLY PICK UP IF YOU FEEL BETTER TAKING THIS   Labwork: BMET IN 1 WEEK   Testing/Procedures: NONE  Follow-Up: 1 TO 2 MONTHS WITH CAITLIN W NP OR DR [place]   Any Other Special Instructions Will Be Listed Below (If Applicable). CONTINUE TO MONITOR YOUR BLOOD PRESSURE AT HOME   If you need a refill on your cardiac medications before your next appointment, please call your pharmacy.

## 2024-01-14 NOTE — Progress Notes (Signed)
 Advanced Hypertension Clinic Follow Up:    Date:  01/14/2024   ID:  Mariah Kelley, DOB 1950-12-05, MRN [id number]  PCP:  Mila Palmer, MD  Cardiologist:  Nanetta Batty, MD  Nephrologist:  Referring MD: Mila Palmer, MD   CC: Hypertension  History of Present Illness:    Mariah Kelley is a 73 y.o. female with a hx of hypertension, hyperlipidemia, diabetes, CAD, OSA on BiPAP, morbid obesity status post bariatric surgery, and prior tobacco abuse here for follow-up.  She first came to establish care in the Advanced Hypertension Clinic 07/2023.  She has a history of STEMI in 2015.  She underwent PCI of the RCA.  She saw Azalee Course, [place] 02/980 for blood pressure 210/108.  She had gone for preadmission testing for kidney stone removal.  She was started on amlodipine 5 mg daily and referred to the advanced hypertension clinic.  She followed up with Gillian Shields, NP 07/2023 and her home blood pressures were labile ranging from the 90s to the 190s.  Typically blood pressures at home were in the 120s to 130s though.  She is working to reduce sodium intake and was taking metoprolol and amlodipine.  At that visit she was switched to metoprolol succinate and referred to the prep program.  She follow-up with our pharmacist 10/2023 and home blood pressure was averaging 142/71.  It was controlled in the office.  At her visit 11/2023 blood pressure was averaging from the 120s to the 170s.  She reported exertional dyspnea.  Nuclear stress test 12/2023 was negative for ischemia.  LVEF was 47%.  She was referred for an echo which revealed LVEF 50-55% with indeterminate diastolic function.  Ascending aorta was 4.0 cm.  Amlodipine was increased to 10 mg.  She continues to follow-up with Dr. Allyson Sabal f and had repeat carotid Dopplers 12/2023 which revealed mild stenosis bilaterally.  Recent BP in [facility] RPM is averaging 130s-140s/60-70s.  Range 126-153/56-81.  She reports that she has been feeling well.   She walks regularly but is limited by leg pain.  She notes that despite walking consistently she continues to have exertional dyspnea.  She reports moving from 19-100 50s.  She does note that she has been diagnosed with mild COPD but has not required treatment.  PFTs were last checked in 2018.  She has not had aortic review, orthopnea, or PND.  She denies chest pain or pressure.  She also blood pressures are better controlled at home but still not quite at goal.  Previous antihypertensives:  Past Medical History:  Diagnosis Date   Arthritis    Asthma    Breast cancer of upper-outer quadrant of right female breast (HCC) 12/23/2014   Bruises easily    CAD S/P percutaneous coronary angioplasty 01/17/2014   takes Brilinta and Plavix daily   Cataracts, bilateral    Complication of anesthesia    COPD (chronic obstructive pulmonary disease) (HCC)    Diabetes mellitus without complication (HCC)    pre DM per pt. now after weight loss surgery   no meds   Dyspnea    Exertional dyspnea 12/07/2023   History of bronchitis 7-70yrs ago   History of tobacco abuse    40 pack-years   Hyperlipidemia    takes Lipitor daily   Hypertension    takes Metoprolol daily   Joint pain    Obesity    OSA treated with BiPAP    moderate with AHI 19/hr now on BIPAP auto   PONV (postoperative  nausea and vomiting)    [place]   ST elevation myocardial infarction (STEMI) of inferolateral wall (HCC) 01/17/2014    Past Surgical History:  Procedure Laterality Date   ABDOMINAL HYSTERECTOMY     APPENDECTOMY     BREAST REDUCTION SURGERY Left 01/27/2015   BREAST REDUCTION SURGERY Left 01/27/2015   Procedure: MAMMARY REDUCTION  LEFT (BREAST);  Surgeon: Glenna Fellows, MD;  Location: [facility] OR;  Service: Plastics;  Laterality: Left;   CATARACT EXTRACTION W/PHACO Right 04/11/2016   Procedure: CATARACT EXTRACTION PHACO AND INTRAOCULAR LENS PLACEMENT (IOC);  Surgeon: Jethro Bolus, MD;  Location: [facility] ORS;  Service: Ophthalmology;   Laterality: Right;  CDE: 14.15   CATARACT EXTRACTION W/PHACO Left 05/02/2016   Procedure: CATARACT EXTRACTION PHACO AND INTRAOCULAR LENS PLACEMENT (IOC);  Surgeon: Jethro Bolus, MD;  Location: [facility] ORS;  Service: Ophthalmology;  Laterality: Left;  CDE: 12.11   CHOLECYSTECTOMY     COLONOSCOPY     CYSTOSCOPY/URETEROSCOPY/HOLMIUM LASER/STENT PLACEMENT Right 07/24/2023   Procedure: RIGHT URETEROSCOPY/HOLMIUM LASER/STENT PLACEMENT;  Surgeon: Despina Arias, MD;  Location: [facility] ORS;  Service: Urology;  Laterality: Right;   EYE SURGERY     LEFT HEART CATH Bilateral 01/17/2014   Procedure: LEFT HEART CATH;  Surgeon: Runell Gess, MD;  Location: [facility] CATH LAB;  Service: Cardiovascular;  Laterality: Bilateral;   MASTECTOMY W/ SENTINEL NODE BIOPSY Right 01/27/2015   dr Ezzard Standing    MASTECTOMY W/ SENTINEL NODE BIOPSY Right 01/27/2015   Procedure: RIGHT MASTECTOMY WITH RIGHT AXILLARY  SENTINEL LYMPH NODE BIOPSY;  Surgeon: Ovidio Kin, MD;  Location: [facility] OR;  Service: General;  Laterality: Right;   PERCUTANEOUS CORONARY STENT INTERVENTION (PCI-S)     PORTACATH PLACEMENT Left 03/23/2015   Procedure: INSERTION PORT-A-CATH ;  Surgeon: Ovidio Kin, MD;  Location: [facility] ORS;  Service: General;  Laterality: Left;   sleeve gastrectomy     SPINAL FUSION     TOTAL KNEE ARTHROPLASTY Left 10/13/2019   Procedure: TOTAL KNEE ARTHROPLASTY;  Surgeon: Dannielle Huh, MD;  Location: [facility] ORS;  Service: Orthopedics;  Laterality: Left;  75 mins needed for length of case.   TOTAL SHOULDER ARTHROPLASTY Left 04/19/2017   Procedure: TOTAL SHOULDER ARTHROPLASTY;  Surgeon: Jones Broom, MD;  Location: [facility] OR;  Service: Orthopedics;  Laterality: Left;  Left total shoulder arthroplasty   TRIGGER FINGER RELEASE Right 10/13/2021   Procedure: RELEASE TRIGGER FINGER/A-1 PULLEY RIGHT MIDDLE FINGER;  Surgeon: Cindee Salt, MD;  Location: [facility];  Service: Orthopedics;  Laterality: Right;   TUBAL LIGATION      Current  Medications: Current Meds  Medication Sig   amLODipine (NORVASC) 10 MG tablet Take 1 tablet (10 mg total) by mouth daily.   aspirin 81 MG chewable tablet Chew 81 mg by mouth daily.   atorvastatin (LIPITOR) 80 MG tablet TAKE 1 TABLET BY MOUTH DAILY AT 6 PM.   Calcium Carbonate-Vitamin D (CALCIUM-D PO) Take 2 tablets by mouth daily.   cholecalciferol (VITAMIN D3) 25 MCG (1000 UNIT) tablet Take 1,000 Units by mouth daily.   empagliflozin (JARDIANCE) 10 MG TABS tablet Take 1 tablet (10 mg total) by mouth daily before breakfast.   empagliflozin (JARDIANCE) 10 MG TABS tablet Take 1 tablet (10 mg total) by mouth daily before breakfast for 14 days.   inclisiran (LEQVIO) 284 MG/1.5ML SOSY injection Inject 284 mg into the skin every 6 (six) months.   nitroGLYCERIN (NITROSTAT) 0.4 MG SL tablet PLACE 1 TABLET UNDER THE TONGUE EVERY 5 MINUTES AS NEEDED FOR CHEST PAIN.  valsartan (DIOVAN) 160 MG tablet Take 1 tablet (160 mg total) by mouth daily.     Allergies:   Prednisone and Codeine   Social History   Socioeconomic History   Marital status: Single    Spouse name: Not on file   Number of children: Not on file   Years of education: Not on file   Highest education level: Not on file  Occupational History   Occupation: Social Worker  Tobacco Use   Smoking status: Former    Current packs/day: 0.00    Average packs/day: 1 pack/day for 40.0 years (40.0 ttl pk-yrs)    Types: Cigarettes    Start date: 07/30/1969    Quit date: 07/30/2009    Years since quitting: 14.4   Smokeless tobacco: Former   Tobacco comments:    quit 5 yrs ago  Vaping Use   Vaping status: Never Used  Substance and Sexual Activity   Alcohol use: No   Drug use: No   Sexual activity: Not Currently    Birth control/protection: Surgical  Other Topics Concern   Not on file  Social History Narrative   Divorced, 2 children.    Social Drivers of Corporate investment banker Strain: Low Risk  (07/26/2023)   Overall Financial  Resource Strain (CARDIA)    Difficulty of Paying Living Expenses: Not hard at all  Food Insecurity: No Food Insecurity (07/26/2023)   Hunger Vital Sign    Worried About Running Out of Food in the Last Year: Never true    Ran Out of Food in the Last Year: Never true  Transportation Needs: No Transportation Needs (07/26/2023)   PRAPARE - Administrator, Civil Service (Medical): No    Lack of Transportation (Non-Medical): No  Physical Activity: Inactive (07/26/2023)   Exercise Vital Sign    Days of Exercise per Week: 0 days    Minutes of Exercise per Session: 0 min  Stress: Not on file  Social Connections: Not on file     Family History: The patient's family history includes CAD in her brother and brother; Heart attack (age of onset: 34) in her mother; Heart attack (age of onset: 12) in her father; Hypertension in her maternal grandmother and mother.  ROS:   Please see the history of present illness.     All other systems reviewed and are negative.  EKGs/Labs/Other Studies Reviewed:    EKG:  EKG is not ordered today.    Lexiscan Myoview 12/2023:   Findings are consistent with no ischemia and no infarction. The study is low risk.   No ST deviation was noted.   Left ventricular function is abnormal. Global function is mildly reduced. There were no regional wall motion abnormalities. Nuclear stress EF: 47%. The left ventricular ejection fraction is mildly decreased (45-54%). End diastolic cavity size is mildly enlarged. End systolic cavity size is mildly enlarged.   Suggest echo correlation for EF since gated images had no RWMA and perfusion normal  Echo 12/2023: IMPRESSIONS     1. Left ventricular ejection fraction, by estimation, is 50 to 55%. The  left ventricle has low normal function. The left ventricle has no regional  wall motion abnormalities. Left ventricular diastolic parameters are  indeterminate.   2. Right ventricular systolic function is normal. The right  ventricular  size is normal. Tricuspid regurgitation signal is inadequate for assessing  PA pressure.   3. Left atrial size was mildly dilated.   4. The mitral valve is grossly  normal. No evidence of mitral valve  regurgitation. No evidence of mitral stenosis.   5. The aortic valve is grossly normal. There is mild calcification of the  aortic valve. Aortic valve regurgitation is not visualized.   6. Aortic dilatation noted. There is mild dilatation of the ascending  aorta, measuring 40 mm.   7. The inferior vena cava is normal in size with greater than 50%  respiratory variability, suggesting right atrial pressure of 3 mmHg.   Carotid Doppler 12/2023:  Summary:  Right Carotid: Velocities in the right ICA are consistent with a 1-39%  stenosis.                The ECA appears >50% stenosed.   Left Carotid: Velocities in the left ICA are consistent with a 1-39%  stenosis.               Non-hemodynamically significant plaque <50% noted in the  CCA. The                ECA appears >50% stenosed. Follow up in 12 months in view of                significant plaque burden noted L>R. Bilateral ECA stenosis  of               >50% was also suggested by prior study 04/03/2018.   Vertebrals:  Bilateral vertebral arteries demonstrate antegrade flow.  Subclavians: Right subclavian artery flow was disturbed. Normal flow               hemodynamics were seen in the left subclavian artery.   *See table(s) above for measurements and observations.  Suggest follow up study in 12 months due to plaque formation.    Recent Labs: 07/14/2023: ALT 27; Hemoglobin 11.7; Platelets 273 07/18/2023: BUN 16; Creatinine, Ser 1.00; Potassium 5.1; Sodium 146 07/26/2023: TSH 1.130   Recent Lipid Panel    Component Value Date/Time   CHOL 132 10/16/2018 0822   TRIG 120 10/16/2018 0822   HDL 51 10/16/2018 0822   CHOLHDL 2.6 10/16/2018 0822   CHOLHDL 4.7 01/18/2014 0314   VLDL 25 01/18/2014 0314   LDLCALC 57  10/16/2018 0822    Physical Exam:   VS:  BP (!) 156/71   Pulse 77   Ht 5\' 7"  (1.702 m)   Wt 260 lb (117.9 kg)   SpO2 96%   BMI 40.72 kg/m  , BMI Body mass index is 40.72 kg/m. GENERAL:  Well appearing HEENT: Pupils equal round and reactive, fundi not visualized, oral mucosa unremarkable NECK:  No jugular venous distention, waveform within normal limits, carotid upstroke brisk and symmetric, no bruits, no thyromegaly LUNGS:  Clear to auscultation bilaterally HEART:  RRR.  PMI not displaced or sustained,S1 and S2 within normal limits, no S3, no S4, no clicks, no rubs, no murmurs ABD:  Flat, positive bowel sounds normal in frequency in pitch, no bruits, no rebound, no guarding, no midline pulsatile mass, no hepatomegaly, no splenomegaly EXT:  2 plus pulses throughout, no edema, no cyanosis no clubbing SKIN:  No rashes no nodules NEURO:  Cranial nerves II through XII grossly intact, motor grossly intact throughout PSYCH:  Cognitively intact, oriented to person place and time  ASSESSMENT/PLAN:    # Hypertension: Blood pressure is not at goal.  She should be less than 130/80.  Last A1c was 7.1%, consistent with diabetes.  Will add valsartan 160 mg daily.  Continue amlodipine and metoprolol.  Check basic  metabolic panel in a week.  Continue with regular exercise.  Continue [facility] RPM.   # Exertional dyspnea: She continues to have exertional dyspnea that is not improving despite consistent exercise.  Stress testing was negative for ischemia.  Will add Jardiance 10 mg daily.  This should help if she is having any diastolic heart failure.  LVEF was low normal.  She also has a hemoglobin A1c of 1% so that should be helpful for that as well.  Will also repeat PFTs as she has a significant smoking history and has not had pulmonary function testing in many years.  # Morbid obesity: Encouraged to keep working on diet and exercise as above.  Status post bariatric surgery.  # Carotid stenosis: #  CAD: # Hyperlipidemia: Lipids are well-controlled on atorvastatin and inclisiran.  Continue aspirin.  Continue beta blocker.   # OSA: Continue BiPAP   Screening for Secondary Hypertension:     07/24/2023    5:00 PM  Causes  Drugs/Herbals Screened  Renovascular HTN Screened  Sleep Apnea Screened     - Comments wearing BIPAP regularly  Thyroid Disease Screened  Hyperaldosteronism Screened  Pheochromocytoma Screened     - Comments 07/2023 normal adrenals on CT  Compliance Screened    Relevant Labs/Studies:    Latest Ref Rng & Units 07/18/2023    4:41 PM 07/14/2023   11:19 AM 07/14/2023   11:11 AM  Basic Labs  Sodium 134 - 144 mmol/L 146  135  138   Potassium 3.5 - 5.2 mmol/L 5.1  8.0  4.6   Creatinine 0.57 - 1.00 mg/dL 1.61  0.96  0.45        Latest Ref Rng & Units 07/26/2023    3:07 PM 01/17/2014    3:39 PM  Thyroid   TSH 0.450 - 4.500 uIU/mL 1.130  1.318        Latest Ref Rng & Units 07/26/2023    3:07 PM  Renin/Aldosterone   Aldosterone 0.0 - 30.0 ng/dL 4.1   Aldos/Renin Ratio 0.0 - 30.0 3.6              08/13/2023    1:40 PM  Renovascular   Renal Artery [place] Completed Yes     Disposition:    FU with MD/PharmD in 1-2 months    Medication Adjustments/Labs and Tests Ordered: Current medicines are reviewed at length with the patient today.  Concerns regarding medicines are outlined above.  No orders of the defined types were placed in this encounter.  Meds ordered this encounter  Medications   valsartan (DIOVAN) 160 MG tablet    Sig: Take 1 tablet (160 mg total) by mouth daily.    Dispense:  90 tablet    Refill:  1   empagliflozin (JARDIANCE) 10 MG TABS tablet    Sig: Take 1 tablet (10 mg total) by mouth daily before breakfast.    Dispense:  30 tablet    Refill:  5    PATIENT WILL CALL WHEN NEEDS FILLED   empagliflozin (JARDIANCE) 10 MG TABS tablet    Sig: Take 1 tablet (10 mg total) by mouth daily before breakfast for 14 days.     Signed, Chilton Si, MD  01/14/2024 8:59 AM    [facility]

## 2024-01-21 DIAGNOSIS — I10 Essential (primary) hypertension: Secondary | ICD-10-CM | POA: Diagnosis not present

## 2024-01-21 DIAGNOSIS — Z5181 Encounter for therapeutic drug level monitoring: Secondary | ICD-10-CM | POA: Diagnosis not present

## 2024-01-21 LAB — BASIC METABOLIC PANEL
BUN/Creatinine Ratio: 20 (ref 12–28)
BUN: 19 mg/dL (ref 8–27)
CO2: 20 mmol/L (ref 20–29)
Calcium: 9.6 mg/dL (ref 8.7–10.3)
Chloride: 110 mmol/L — ABNORMAL HIGH (ref 96–106)
Creatinine, Ser: 0.97 mg/dL (ref 0.57–1.00)
Glucose: 163 mg/dL — ABNORMAL HIGH (ref 70–99)
Potassium: 4.2 mmol/L (ref 3.5–5.2)
Sodium: 145 mmol/L — ABNORMAL HIGH (ref 134–144)
eGFR: 62 mL/min/{1.73_m2} (ref >59)

## 2024-01-24 ENCOUNTER — Other Ambulatory Visit (HOSPITAL_BASED_OUTPATIENT_CLINIC_OR_DEPARTMENT_OTHER): Payer: Self-pay

## 2024-01-24 DIAGNOSIS — E1121 Type 2 diabetes mellitus with diabetic nephropathy: Secondary | ICD-10-CM | POA: Diagnosis not present

## 2024-01-24 LAB — LAB REPORT - SCANNED: A1c: 8.7

## 2024-01-28 ENCOUNTER — Other Ambulatory Visit (HOSPITAL_BASED_OUTPATIENT_CLINIC_OR_DEPARTMENT_OTHER): Payer: Self-pay

## 2024-01-29 ENCOUNTER — Other Ambulatory Visit (HOSPITAL_BASED_OUTPATIENT_CLINIC_OR_DEPARTMENT_OTHER): Payer: Self-pay

## 2024-01-29 MED ORDER — OZEMPIC (0.25 OR 0.5 MG/DOSE) 2 MG/3ML ~~LOC~~ SOPN
0.2500 | PEN_INJECTOR | SUBCUTANEOUS | 3 refills | Status: DC
  Filled 2024-01-29: qty 3, 28d supply, fill #0
  Filled 2024-02-22 (×2): qty 3, 28d supply, fill #1

## 2024-02-05 ENCOUNTER — Encounter (HOSPITAL_BASED_OUTPATIENT_CLINIC_OR_DEPARTMENT_OTHER): Payer: Self-pay

## 2024-02-05 ENCOUNTER — Telehealth: Payer: Self-pay

## 2024-02-05 ENCOUNTER — Encounter (HOSPITAL_COMMUNITY): Payer: Medicare Other

## 2024-02-05 DIAGNOSIS — Z006 Encounter for examination for normal comparison and control in clinical research program: Secondary | ICD-10-CM

## 2024-02-05 NOTE — Telephone Encounter (Signed)
 Called pt and left a message stating our contract has ended with [employer]. We recommend continuing to take your blood pressure and record a b/p log on paper. Bring your b/p log to your next appointment.You can return the blood pressure cuff given to you at your next appointment also.

## 2024-02-08 ENCOUNTER — Ambulatory Visit (INDEPENDENT_AMBULATORY_CARE_PROVIDER_SITE_OTHER): Payer: Medicare Other

## 2024-02-08 VITALS — BP 150/72 | HR 98 | Temp 97.6°F | Resp 20 | Ht 67.0 in | Wt 262.4 lb

## 2024-02-08 DIAGNOSIS — E785 Hyperlipidemia, unspecified: Secondary | ICD-10-CM | POA: Diagnosis not present

## 2024-02-08 DIAGNOSIS — I2510 Atherosclerotic heart disease of native coronary artery without angina pectoris: Secondary | ICD-10-CM

## 2024-02-08 DIAGNOSIS — I252 Old myocardial infarction: Secondary | ICD-10-CM

## 2024-02-08 DIAGNOSIS — Z9861 Coronary angioplasty status: Secondary | ICD-10-CM | POA: Diagnosis not present

## 2024-02-08 MED ORDER — INCLISIRAN SODIUM 284 MG/1.5ML ~~LOC~~ SOSY
284.0000 mg | PREFILLED_SYRINGE | Freq: Once | SUBCUTANEOUS | Status: AC
  Administered 2024-02-08: 284 mg via SUBCUTANEOUS
  Filled 2024-02-08: qty 1.5

## 2024-02-08 NOTE — Progress Notes (Signed)
 Diagnosis: Hyperlipidemia  Provider:  Chilton Greathouse MD  Procedure: Injection  Leqvio (inclisiran), Dose: 284 mg, Site: subcutaneous, Number of injections: 1  Injection Site(s): Left arm  Post Care: Patient declined observation  Discharge: Condition: Good, Destination: Home . AVS Declined  Performed by:  Loney Hering, LPN

## 2024-02-18 ENCOUNTER — Encounter (HOSPITAL_BASED_OUTPATIENT_CLINIC_OR_DEPARTMENT_OTHER): Payer: Self-pay

## 2024-02-22 ENCOUNTER — Other Ambulatory Visit (HOSPITAL_BASED_OUTPATIENT_CLINIC_OR_DEPARTMENT_OTHER): Payer: Self-pay

## 2024-02-22 ENCOUNTER — Other Ambulatory Visit: Payer: Self-pay

## 2024-02-23 ENCOUNTER — Other Ambulatory Visit: Payer: Self-pay | Admitting: Cardiovascular Disease

## 2024-02-23 ENCOUNTER — Other Ambulatory Visit (HOSPITAL_BASED_OUTPATIENT_CLINIC_OR_DEPARTMENT_OTHER): Payer: Self-pay

## 2024-02-25 ENCOUNTER — Other Ambulatory Visit (HOSPITAL_BASED_OUTPATIENT_CLINIC_OR_DEPARTMENT_OTHER): Payer: Self-pay

## 2024-02-25 DIAGNOSIS — Z1231 Encounter for screening mammogram for malignant neoplasm of breast: Secondary | ICD-10-CM | POA: Diagnosis not present

## 2024-02-25 MED ORDER — NITROGLYCERIN 0.4 MG SL SUBL
SUBLINGUAL_TABLET | SUBLINGUAL | 3 refills | Status: AC
  Filled 2024-02-25: qty 25, 30d supply, fill #0

## 2024-02-26 ENCOUNTER — Other Ambulatory Visit (HOSPITAL_BASED_OUTPATIENT_CLINIC_OR_DEPARTMENT_OTHER): Payer: Self-pay

## 2024-02-26 MED ORDER — ATORVASTATIN CALCIUM 80 MG PO TABS
80.0000 mg | ORAL_TABLET | Freq: Every day | ORAL | 3 refills | Status: AC
  Filled 2024-02-26 – 2024-03-08 (×3): qty 90, 90d supply, fill #0
  Filled 2024-06-04: qty 90, 90d supply, fill #1
  Filled 2024-09-02: qty 90, 90d supply, fill #2
  Filled 2024-12-01: qty 90, 90d supply, fill #3

## 2024-03-08 ENCOUNTER — Other Ambulatory Visit (HOSPITAL_BASED_OUTPATIENT_CLINIC_OR_DEPARTMENT_OTHER): Payer: Self-pay

## 2024-03-08 ENCOUNTER — Other Ambulatory Visit: Payer: Self-pay

## 2024-03-09 ENCOUNTER — Other Ambulatory Visit: Payer: Self-pay

## 2024-03-17 ENCOUNTER — Other Ambulatory Visit: Payer: Self-pay | Admitting: Cardiovascular Disease

## 2024-03-20 ENCOUNTER — Other Ambulatory Visit (HOSPITAL_BASED_OUTPATIENT_CLINIC_OR_DEPARTMENT_OTHER): Payer: Self-pay

## 2024-03-20 ENCOUNTER — Other Ambulatory Visit: Payer: Self-pay

## 2024-03-24 ENCOUNTER — Other Ambulatory Visit (HOSPITAL_BASED_OUTPATIENT_CLINIC_OR_DEPARTMENT_OTHER): Payer: Self-pay

## 2024-03-27 ENCOUNTER — Encounter (HOSPITAL_BASED_OUTPATIENT_CLINIC_OR_DEPARTMENT_OTHER): Payer: Self-pay | Admitting: Cardiovascular Disease

## 2024-03-27 ENCOUNTER — Ambulatory Visit (INDEPENDENT_AMBULATORY_CARE_PROVIDER_SITE_OTHER): Admitting: Cardiovascular Disease

## 2024-03-27 VITALS — BP 125/71 | HR 87 | Ht 67.0 in | Wt 259.4 lb

## 2024-03-27 DIAGNOSIS — Z9861 Coronary angioplasty status: Secondary | ICD-10-CM

## 2024-03-27 DIAGNOSIS — I10 Essential (primary) hypertension: Secondary | ICD-10-CM

## 2024-03-27 DIAGNOSIS — I2510 Atherosclerotic heart disease of native coronary artery without angina pectoris: Secondary | ICD-10-CM

## 2024-03-27 DIAGNOSIS — G4733 Obstructive sleep apnea (adult) (pediatric): Secondary | ICD-10-CM | POA: Diagnosis not present

## 2024-03-27 DIAGNOSIS — I252 Old myocardial infarction: Secondary | ICD-10-CM

## 2024-03-27 NOTE — Progress Notes (Signed)
 Advanced Hypertension Clinic Follow Up:    Date:  03/27/2024   ID:  Mariah Kelley, DOB 05-Dec-1950, MRN [id number]  PCP:  Olin Bertin, MD  Cardiologist:  Lauro Portal, MD  Nephrologist:  Referring MD: Olin Bertin, MD   CC: Hypertension  History of Present Illness:    Mariah Kelley is a 73 y.o. female with a hx of hypertension, hyperlipidemia, diabetes, CAD, OSA on BiPAP, morbid obesity status post bariatric surgery, and prior tobacco abuse here for follow-up.  She first came to establish care in the Advanced Hypertension Clinic 07/2023.  She has a history of STEMI in 2015.  She underwent PCI of the RCA.  She saw Ervin Heath, [place] 04/3874 for blood pressure 210/108.  She had gone for preadmission testing for kidney stone removal.  She was started on amlodipine  5 mg daily and referred to the advanced hypertension clinic.  She followed up with Neomi Banks, NP 07/2023 and her home blood pressures were labile ranging from the 90s to the 190s.  Typically blood pressures at home were in the 120s to 130s though.  She is working to reduce sodium intake and was taking metoprolol  and amlodipine .  At that visit she was switched to metoprolol  succinate and referred to the prep program.  She follow-up with our pharmacist 10/2023 and home blood pressure was averaging 142/71.  It was controlled in the office.  At her visit 11/2023 blood pressure was averaging from the 120s to the 170s.  She reported exertional dyspnea.  Nuclear stress test 12/2023 was negative for ischemia.  LVEF was 47%.  She was referred for an echo which revealed LVEF 50-55% with indeterminate diastolic function.  Ascending aorta was 4.0 cm.  Amlodipine  was increased to 10 mg.  She continues to follow-up with Dr. Katheryne Pane f and had repeat carotid Dopplers 12/2023 which revealed mild stenosis bilaterally.  At her visit 01/2024 remote patient monitoring revealed blood pressures averaging in the 130s to 140s over 60s to 70s.   Valsartan  was added.  She was also started on Jardiance  due to exertional dyspnea not improving despite exercise.  Discussed the use of AI scribe software for clinical note transcription with the patient, who gave verbal consent to proceed.  History of Present Illness [name] notes that her blood pressure is well-controlled with readings in the 120s/60s at home. She is currently taking amlodipine , metoprolol , and valsartan  for hypertension.  She has type 2 diabetes with a recent A1c of 8.1%. She has been on Ozempic  but experiences significant gastrointestinal side effects including diarrhea and stomach upset, which are particularly challenging while working. She previously experienced nausea with an increased dose of Ozempic .  She is on atorvastatin  and inclisiran for cholesterol management, with an LDL level of 26. No issues with the injections and no muscle aches. She attributes her joint pain to arthritis, mentioning knee, hip, and hand issues.  She mentions a family history of uncontrollable blood pressure in her mother, which has been a concern for her. She is still working and finds it challenging to manage her symptoms while at work.   Previous antihypertensives:  Past Medical History:  Diagnosis Date   Arthritis    Asthma    Breast cancer of upper-outer quadrant of right female breast (HCC) 12/23/2014   Bruises easily    CAD S/P percutaneous coronary angioplasty 01/17/2014   takes Brilinta  and Plavix  daily   Cataracts, bilateral    Complication of anesthesia    COPD (chronic obstructive  pulmonary disease) (HCC)    Diabetes mellitus without complication (HCC)    pre DM per pt. now after weight loss surgery   no meds   Dyspnea    Exertional dyspnea 12/07/2023   History of bronchitis 7-63yrs ago   History of tobacco abuse    40 pack-years   Hyperlipidemia    takes Lipitor  daily   Hypertension    takes Metoprolol  daily   Joint pain    Obesity    OSA treated with  BiPAP    moderate with AHI 19/hr now on BIPAP auto   PONV (postoperative nausea and vomiting)    [place]   ST elevation myocardial infarction (STEMI) of inferolateral wall (HCC) 01/17/2014    Past Surgical History:  Procedure Laterality Date   ABDOMINAL HYSTERECTOMY     APPENDECTOMY     BREAST REDUCTION SURGERY Left 01/27/2015   BREAST REDUCTION SURGERY Left 01/27/2015   Procedure: MAMMARY REDUCTION  LEFT (BREAST);  Surgeon: Alger Infield, MD;  Location: [facility] OR;  Service: Plastics;  Laterality: Left;   CATARACT EXTRACTION W/PHACO Right 04/11/2016   Procedure: CATARACT EXTRACTION PHACO AND INTRAOCULAR LENS PLACEMENT (IOC);  Surgeon: Albert Huff, MD;  Location: [facility] ORS;  Service: Ophthalmology;  Laterality: Right;  CDE: 14.15   CATARACT EXTRACTION W/PHACO Left 05/02/2016   Procedure: CATARACT EXTRACTION PHACO AND INTRAOCULAR LENS PLACEMENT (IOC);  Surgeon: Albert Huff, MD;  Location: [facility];  Service: Ophthalmology;  Laterality: Left;  CDE: 12.11   CHOLECYSTECTOMY     COLONOSCOPY     CYSTOSCOPY/URETEROSCOPY/HOLMIUM LASER/STENT PLACEMENT Right 07/24/2023   Procedure: RIGHT URETEROSCOPY/HOLMIUM LASER/STENT PLACEMENT;  Surgeon: Mallie Seal, MD;  Location: [facility] ORS;  Service: Urology;  Laterality: Right;   EYE SURGERY     LEFT HEART CATH Bilateral 01/17/2014   Procedure: LEFT HEART CATH;  Surgeon: Avanell Leigh, MD;  Location: [facility] CATH LAB;  Service: Cardiovascular;  Laterality: Bilateral;   MASTECTOMY W/ SENTINEL NODE BIOPSY Right 01/27/2015   dr Odean [place]    MASTECTOMY W/ SENTINEL NODE BIOPSY Right 01/27/2015   Procedure: RIGHT MASTECTOMY WITH RIGHT AXILLARY  SENTINEL LYMPH NODE BIOPSY;  Surgeon: Juanita Norlander, MD;  Location: [facility] OR;  Service: General;  Laterality: Right;   PERCUTANEOUS CORONARY STENT INTERVENTION (PCI-S)     PORTACATH PLACEMENT Left 03/23/2015   Procedure: INSERTION PORT-A-CATH ;  Surgeon: Juanita Norlander, MD;  Location: [facility] ORS;  Service: General;  Laterality: Left;   sleeve  gastrectomy     SPINAL FUSION     TOTAL KNEE ARTHROPLASTY Left 10/13/2019   Procedure: TOTAL KNEE ARTHROPLASTY;  Surgeon: Christie Cox, MD;  Location: [facility] ORS;  Service: Orthopedics;  Laterality: Left;  75 mins needed for length of case.   TOTAL SHOULDER ARTHROPLASTY Left 04/19/2017   Procedure: TOTAL SHOULDER ARTHROPLASTY;  Surgeon: Sammye Cristal, MD;  Location: [facility] OR;  Service: Orthopedics;  Laterality: Left;  Left total shoulder arthroplasty   TRIGGER FINGER RELEASE Right 10/13/2021   Procedure: RELEASE TRIGGER FINGER/A-1 PULLEY RIGHT MIDDLE FINGER;  Surgeon: Lyanne Sample, MD;  Location: [facility];  Service: Orthopedics;  Laterality: Right;   TUBAL LIGATION      Current Medications: Current Meds  Medication Sig   amLODipine  (NORVASC ) 10 MG tablet Take 1 tablet (10 mg total) by mouth daily.   aspirin  81 MG chewable tablet Chew 81 mg by mouth daily.   atorvastatin  (LIPITOR ) 80 MG tablet Take 1 tablet (80 mg total) by mouth daily at 6 PM.   Calcium  Carbonate-Vitamin D (  CALCIUM -D PO) Take 2 tablets by mouth daily.   cholecalciferol (VITAMIN D3) 25 MCG (1000 UNIT) tablet Take 1,000 Units by mouth daily.   empagliflozin  (JARDIANCE ) 10 MG TABS tablet Take 1 tablet (10 mg total) by mouth daily before breakfast.   inclisiran (LEQVIO ) 284 MG/1.5ML SOSY injection Inject 284 mg into the skin every 6 (six) months.   metoprolol  succinate (TOPROL -XL) 50 MG 24 hr tablet Take 1 tablet (50 mg total) by mouth daily with or immediately following a meal.   nitroGLYCERIN  (NITROSTAT ) 0.4 MG SL tablet PLACE 1 TABLET UNDER THE TONGUE EVERY 5 MINUTES AS NEEDED FOR CHEST PAIN.   Semaglutide ,0.25 or 0.5MG /DOS, (OZEMPIC , 0.25 OR 0.5 MG/DOSE,) 2 MG/3ML SOPN Inject 0.25 mg into the skin every 7 (seven) days.   valsartan  (DIOVAN ) 160 MG tablet Take 1 tablet (160 mg total) by mouth daily.     Allergies:   Prednisone  and Codeine   Social History   Socioeconomic History   Marital status: Single     Spouse name: Not on file   Number of children: Not on file   Years of education: Not on file   Highest education level: Not on file  Occupational History   Occupation: Social Worker  Tobacco Use   Smoking status: Former    Current packs/day: 0.00    Average packs/day: 1 pack/day for 40.0 years (40.0 ttl pk-yrs)    Types: Cigarettes    Start date: 07/30/1969    Quit date: 07/30/2009    Years since quitting: 14.6   Smokeless tobacco: Former   Tobacco comments:    quit 5 yrs ago  Vaping Use   Vaping status: Never Used  Substance and Sexual Activity   Alcohol use: No   Drug use: No   Sexual activity: Not Currently    Birth control/protection: Surgical  Other Topics Concern   Not on file  Social History Narrative   Divorced, 2 children.    Social Drivers of Corporate investment banker Strain: Low Risk  (07/26/2023)   Overall Financial Resource Strain (CARDIA)    Difficulty of Paying Living Expenses: Not hard at all  Food Insecurity: No Food Insecurity (07/26/2023)   Hunger Vital Sign    Worried About Running Out of Food in the Last Year: Never true    Ran Out of Food in the Last Year: Never true  Transportation Needs: No Transportation Needs (07/26/2023)   PRAPARE - Administrator, Civil Service (Medical): No    Lack of Transportation (Non-Medical): No  Physical Activity: Inactive (07/26/2023)   Exercise Vital Sign    Days of Exercise per Week: 0 days    Minutes of Exercise per Session: 0 min  Stress: Not on file  Social Connections: Not on file     Family History: The patient's family history includes CAD in her brother and brother; Heart attack (age of onset: 84) in her mother; Heart attack (age of onset: 37) in her father; Hypertension in her maternal grandmother and mother.  ROS:   Please see the history of present illness.     All other systems reviewed and are negative.  EKGs/Labs/Other Studies Reviewed:    EKG:  EKG is not ordered today.    Lexiscan  Myoview  12/2023:   Findings are consistent with no ischemia and no infarction. The study is low risk.   No ST deviation was noted.   Left ventricular function is abnormal. Global function is mildly reduced. There were no regional  wall motion abnormalities. Nuclear stress EF: 47%. The left ventricular ejection fraction is mildly decreased (45-54%). End diastolic cavity size is mildly enlarged. End systolic cavity size is mildly enlarged.   Suggest echo correlation for EF since gated images had no RWMA and perfusion normal  Echo 12/2023: IMPRESSIONS     1. Left ventricular ejection fraction, by estimation, is 50 to 55%. The  left ventricle has low normal function. The left ventricle has no regional  wall motion abnormalities. Left ventricular diastolic parameters are  indeterminate.   2. Right ventricular systolic function is normal. The right ventricular  size is normal. Tricuspid regurgitation signal is inadequate for assessing  PA pressure.   3. Left atrial size was mildly dilated.   4. The mitral valve is grossly normal. No evidence of mitral valve  regurgitation. No evidence of mitral stenosis.   5. The aortic valve is grossly normal. There is mild calcification of the  aortic valve. Aortic valve regurgitation is not visualized.   6. Aortic dilatation noted. There is mild dilatation of the ascending  aorta, measuring 40 mm.   7. The inferior vena cava is normal in size with greater than 50%  respiratory variability, suggesting right atrial pressure of 3 mmHg.   Carotid Doppler 12/2023:  Summary:  Right Carotid: Velocities in the right ICA are consistent with a 1-39%  stenosis.                The ECA appears >50% stenosed.   Left Carotid: Velocities in the left ICA are consistent with a 1-39%  stenosis.               Non-hemodynamically significant plaque <50% noted in the  CCA. The                ECA appears >50% stenosed. Follow up in 12 months in view of                 significant plaque burden noted L>R. Bilateral ECA stenosis  of               >50% was also suggested by prior study 04/03/2018.   Vertebrals:  Bilateral vertebral arteries demonstrate antegrade flow.  Subclavians: Right subclavian artery flow was disturbed. Normal flow               hemodynamics were seen in the left subclavian artery.   *See table(s) above for measurements and observations.  Suggest follow up study in 12 months due to plaque formation.    Recent Labs: 07/14/2023: ALT 27; Hemoglobin 11.7; Platelets 273 07/26/2023: TSH 1.130 01/21/2024: BUN 19; Creatinine, Ser 0.97; Potassium 4.2; Sodium 145   Recent Lipid Panel    Component Value Date/Time   CHOL 132 10/16/2018 0822   TRIG 120 10/16/2018 0822   HDL 51 10/16/2018 0822   CHOLHDL 2.6 10/16/2018 0822   CHOLHDL 4.7 01/18/2014 0314   VLDL 25 01/18/2014 0314   LDLCALC 57 10/16/2018 0822    Physical Exam:   VS:  BP 125/71   Pulse 87   Ht 5\' 7"  (1.702 m)   Wt 259 lb 6.4 oz (117.7 kg)   SpO2 94%   BMI 40.63 kg/m  , BMI Body mass index is 40.63 kg/m. GENERAL:  Well appearing HEENT: Pupils equal round and reactive, fundi not visualized, oral mucosa unremarkable NECK:  No jugular venous distention, waveform within normal limits, carotid upstroke brisk and symmetric, no bruits, no thyromegaly LUNGS:  Clear to auscultation  bilaterally HEART:  RRR.  PMI not displaced or sustained,S1 and S2 within normal limits, no S3, no S4, no clicks, no rubs, no murmurs ABD:  Flat, positive bowel sounds normal in frequency in pitch, no bruits, no rebound, no guarding, no midline pulsatile mass, no hepatomegaly, no splenomegaly EXT:  2 plus pulses throughout, no edema, no cyanosis no clubbing SKIN:  No rashes no nodules NEURO:  Cranial nerves II through XII grossly intact, motor grossly intact throughout PSYCH:  Cognitively intact, oriented to person place and time  ASSESSMENT/PLAN:    Assessment and Plan Assessment & Plan #  Hypertension Hypertension well-controlled with current regimen. Home readings consistently in 120s/60s. Satisfied with control despite family history. - Continue amlodipine , metoprolol , valsartan . - Reevaluate in six months, consider graduation from HTN clinic if stable.  # Hyperlipidemia Hyperlipidemia well-managed with atorvastatin  and inclisiran. LDL at 26, excellent control. - Continue atorvastatin  and inclisiran.  # Type 2 Diabetes Mellitus A1c at 8.7. Significant gastrointestinal side effects from Ozempic  affecting daily activities. Intolerance necessitates change. - Discontinue Ozempic . - Reassess management with primary care next month.      Screening for Secondary Hypertension:     07/24/2023    5:00 PM  Causes  Drugs/Herbals Screened  Renovascular HTN Screened  Sleep Apnea Screened     - Comments wearing BIPAP regularly  Thyroid  Disease Screened  Hyperaldosteronism Screened  Pheochromocytoma Screened     - Comments 07/2023 normal adrenals on CT  Compliance Screened    Relevant Labs/Studies:    Latest Ref Rng & Units 01/21/2024    9:23 AM 07/18/2023    4:41 PM 07/14/2023   11:19 AM  Basic Labs  Sodium 134 - 144 mmol/L 145  146  135   Potassium 3.5 - 5.2 mmol/L 4.2  5.1  8.0   Creatinine 0.57 - 1.00 mg/dL 0.45  4.09  8.11        Latest Ref Rng & Units 07/26/2023    3:07 PM 01/17/2014    3:39 PM  Thyroid    TSH 0.450 - 4.500 uIU/mL 1.130  1.318        Latest Ref Rng & Units 07/26/2023    3:07 PM  Renin/Aldosterone   Aldosterone 0.0 - 30.0 ng/dL 4.1   Aldos/Renin Ratio 0.0 - 30.0 3.6              08/13/2023    1:40 PM  Renovascular   Renal Artery US  Completed Yes     Disposition:    FU with MD/PharmD in 6 months    Medication Adjustments/Labs and Tests Ordered: Current medicines are reviewed at length with the patient today.  Concerns regarding medicines are outlined above.  No orders of the defined types were placed in this encounter.  No orders  of the defined types were placed in this encounter.    Signed, Maudine Sos, MD  03/27/2024 9:18 AM    [facility]

## 2024-03-27 NOTE — Patient Instructions (Addendum)
 Medication Instructions:  Your physician recommends that you continue on your current medications as directed. Please refer to the Current Medication list given to you today.   Labwork: NONE  Testing/Procedures: NONE  Follow-Up: 6 MONTHS WITH CAITLIN W NP OR DR Rulo IN ADV HTN CLINIC   Any Other Special Instructions Will Be Listed Below (If Applicable).

## 2024-03-29 ENCOUNTER — Other Ambulatory Visit (HOSPITAL_COMMUNITY): Payer: Self-pay

## 2024-04-19 ENCOUNTER — Other Ambulatory Visit (HOSPITAL_BASED_OUTPATIENT_CLINIC_OR_DEPARTMENT_OTHER): Payer: Self-pay

## 2024-04-25 ENCOUNTER — Other Ambulatory Visit (HOSPITAL_BASED_OUTPATIENT_CLINIC_OR_DEPARTMENT_OTHER): Payer: Self-pay

## 2024-04-28 DIAGNOSIS — E1140 Type 2 diabetes mellitus with diabetic neuropathy, unspecified: Secondary | ICD-10-CM | POA: Diagnosis not present

## 2024-04-28 DIAGNOSIS — L309 Dermatitis, unspecified: Secondary | ICD-10-CM | POA: Diagnosis not present

## 2024-04-28 DIAGNOSIS — E1121 Type 2 diabetes mellitus with diabetic nephropathy: Secondary | ICD-10-CM | POA: Diagnosis not present

## 2024-04-29 ENCOUNTER — Other Ambulatory Visit (HOSPITAL_BASED_OUTPATIENT_CLINIC_OR_DEPARTMENT_OTHER): Payer: Self-pay

## 2024-04-30 ENCOUNTER — Other Ambulatory Visit (HOSPITAL_BASED_OUTPATIENT_CLINIC_OR_DEPARTMENT_OTHER): Payer: Self-pay

## 2024-04-30 MED ORDER — GABAPENTIN 100 MG PO CAPS
100.0000 mg | ORAL_CAPSULE | Freq: Every day | ORAL | 0 refills | Status: DC
  Filled 2024-04-30: qty 30, 30d supply, fill #0

## 2024-05-07 ENCOUNTER — Other Ambulatory Visit: Payer: Self-pay

## 2024-05-07 ENCOUNTER — Other Ambulatory Visit (HOSPITAL_BASED_OUTPATIENT_CLINIC_OR_DEPARTMENT_OTHER): Payer: Self-pay

## 2024-05-07 MED ORDER — JARDIANCE 25 MG PO TABS
25.0000 mg | ORAL_TABLET | Freq: Every day | ORAL | 0 refills | Status: DC
  Filled 2024-05-07: qty 90, 90d supply, fill #0

## 2024-05-07 MED ORDER — GABAPENTIN 100 MG PO CAPS
100.0000 mg | ORAL_CAPSULE | Freq: Three times a day (TID) | ORAL | 1 refills | Status: DC
  Filled 2024-05-07 (×2): qty 270, 90d supply, fill #0
  Filled 2024-08-01 – 2024-08-08 (×2): qty 270, 90d supply, fill #1

## 2024-05-08 ENCOUNTER — Other Ambulatory Visit (HOSPITAL_BASED_OUTPATIENT_CLINIC_OR_DEPARTMENT_OTHER): Payer: Self-pay

## 2024-05-12 ENCOUNTER — Other Ambulatory Visit (HOSPITAL_BASED_OUTPATIENT_CLINIC_OR_DEPARTMENT_OTHER): Payer: Self-pay | Admitting: Cardiovascular Disease

## 2024-05-12 ENCOUNTER — Other Ambulatory Visit (HOSPITAL_BASED_OUTPATIENT_CLINIC_OR_DEPARTMENT_OTHER): Payer: Self-pay

## 2024-05-14 ENCOUNTER — Other Ambulatory Visit (HOSPITAL_BASED_OUTPATIENT_CLINIC_OR_DEPARTMENT_OTHER): Payer: Self-pay

## 2024-05-14 MED ORDER — VALSARTAN 160 MG PO TABS
160.0000 mg | ORAL_TABLET | Freq: Every day | ORAL | 3 refills | Status: AC
  Filled 2024-05-14: qty 90, 90d supply, fill #0
  Filled 2024-08-10: qty 90, 90d supply, fill #1
  Filled 2024-11-10: qty 90, 90d supply, fill #2

## 2024-05-29 DIAGNOSIS — M180 Bilateral primary osteoarthritis of first carpometacarpal joints: Secondary | ICD-10-CM | POA: Diagnosis not present

## 2024-06-02 ENCOUNTER — Telehealth: Payer: Self-pay | Admitting: *Deleted

## 2024-06-02 NOTE — Telephone Encounter (Signed)
   Pre-operative Risk Assessment    Patient Name: Mariah Kelley  DOB: [birth year] MRN: [id number]   Date of last office visit: 03/27/24 DR. [place] Date of next office visit: NONE   Request for Surgical Clearance    Procedure:  RIGHT CMC ARTHROPLASTY  Date of Surgery:  Clearance TBD                                Surgeon:  DR. LANG SHADOW Surgeon's Group or Practice Name:  [facility] Phone number:  [phone] Fax number:  [phone]   Type of Clearance Requested:   - Medical  - Pharmacy:  Hold Aspirin      Type of Anesthesia:  General    Additional requests/questions:    Mariah Kelley   06/02/2024, 2:06 PM

## 2024-06-04 ENCOUNTER — Telehealth: Payer: Self-pay

## 2024-06-04 NOTE — Telephone Encounter (Signed)
  Patient Consent for Virtual Visit        Mariah Kelley has provided verbal consent on 06/04/2024 for a virtual visit (video or telephone).   CONSENT FOR VIRTUAL VISIT FOR:  Mariah Kelley  By participating in this virtual visit I agree to the following:  I hereby voluntarily request, consent and authorize [facility] and its employed or contracted physicians, physician assistants, nurse practitioners or other licensed health care professionals (the Practitioner), to provide me with telemedicine health care services (the "Services) as deemed necessary by the treating Practitioner. I acknowledge and consent to receive the Services by the Practitioner via telemedicine. I understand that the telemedicine visit will involve communicating with the Practitioner through live audiovisual communication technology and the disclosure of certain medical information by electronic transmission. I acknowledge that I have been given the opportunity to request an in-person assessment or other available alternative prior to the telemedicine visit and am voluntarily participating in the telemedicine visit.  I understand that I have the right to withhold or withdraw my consent to the use of telemedicine in the course of my care at any time, without affecting my right to future care or treatment, and that the Practitioner or I may terminate the telemedicine visit at any time. I understand that I have the right to inspect all information obtained and/or recorded in the course of the telemedicine visit and may receive copies of available information for a reasonable fee.  I understand that some of the potential risks of receiving the Services via telemedicine include:  Delay or interruption in medical evaluation due to technological equipment failure or disruption; Information transmitted may not be sufficient (e.g. poor resolution of images) to allow for appropriate medical decision making by the  Practitioner; and/or  In rare instances, security protocols could fail, causing a breach of personal health information.  Furthermore, I acknowledge that it is my responsibility to provide information about my medical history, conditions and care that is complete and accurate to the best of my ability. I acknowledge that Practitioner's advice, recommendations, and/or decision may be based on factors not within their control, such as incomplete or inaccurate data provided by me or distortions of diagnostic images or specimens that may result from electronic transmissions. I understand that the practice of medicine is not an exact science and that Practitioner makes no warranties or guarantees regarding treatment outcomes. I acknowledge that a copy of this consent can be made available to me via my patient portal [facility] MyChart), or I can request a printed copy by calling the office of [facility].    I understand that my insurance will be billed for this visit.   I have read or had this consent read to me. I understand the contents of this consent, which adequately explains the benefits and risks of the Services being provided via telemedicine.  I have been provided ample opportunity to ask questions regarding this consent and the Services and have had my questions answered to my satisfaction. I give my informed consent for the services to be provided through the use of telemedicine in my medical care

## 2024-06-04 NOTE — Telephone Encounter (Signed)
   Name: Mariah Kelley  DOB: [birth year]  MRN: [id number]  Primary Cardiologist: Dorn Lesches, MD   Preoperative team, please contact this patient and set up a phone call appointment for further preoperative risk assessment. Please obtain consent and complete medication review. Thank you for your help.Last seen by Dr.Fairview on 03/27/2024. Procedure date to be determined.  I confirm that guidance regarding antiplatelet and oral anticoagulation therapy has been completed and, if necessary, noted below.  Per office protocol, if patient is without any new symptoms or concerns at the time of their virtual visit, she may hold ASA for 7 days prior to procedure. Please resume ASA as soon as possible postprocedure, at the discretion of the surgeon.    I also confirmed the patient resides in the state of [place] . As per [facility] Medical Board telemedicine laws, the patient must reside in the state in which the provider is licensed.   Lamarr Satterfield, NP 06/04/2024, 7:54 AM [facility]

## 2024-06-04 NOTE — Telephone Encounter (Signed)
 Preop tele appt now scheduled, med rec and consent done

## 2024-06-09 DIAGNOSIS — E119 Type 2 diabetes mellitus without complications: Secondary | ICD-10-CM | POA: Diagnosis not present

## 2024-06-19 ENCOUNTER — Ambulatory Visit: Attending: Internal Medicine | Admitting: Nurse Practitioner

## 2024-06-19 DIAGNOSIS — Z01810 Encounter for preprocedural cardiovascular examination: Secondary | ICD-10-CM | POA: Diagnosis not present

## 2024-06-19 NOTE — Progress Notes (Signed)
 Virtual Visit via Telephone Note   Because of Mariah Kelley co-morbid illnesses, she is at least at moderate risk for complications without adequate follow up.  This format is felt to be most appropriate for this patient at this time.  Due to technical limitations with video connection (technology), today's appointment will be conducted as an audio only telehealth visit, and Mariah Kelley verbally agreed to proceed in this manner.   All issues noted in this document were discussed and addressed.  No physical exam could be performed with this format.  Evaluation Performed:  Preoperative cardiovascular risk assessment _____________   Date:  06/19/2024   Patient ID:  Mariah Kelley, DOB 06/25/1951, MRN [id number] Patient Location:  Home Provider location:   Office  Primary Care Provider:  Verena Mems, MD Primary Cardiologist:  Dorn Lesches, MD  Chief Complaint / Patient Profile   73 y.o. y/o female with a h/o CAD s/p DES-RCA in 2015, bilateral carotid artery stenosis, hypertension, hyperlipidemia, type 2 diabetes, OSA, obesity, and former tobacco use who is pending right [facility] arthroplasty with Dr. Lang Shadow of [facility] (pt states surgery will be Dr. Ozell Hock) and presents today for telephonic preoperative cardiovascular risk assessment.  History of Present Illness    Mariah Kelley is a 73 y.o. female who presents via audio/video conferencing for a telehealth visit today.  Pt was last seen in cardiology clinic on 03/27/2024 by Dr. Raford.  At that time Mariah Kelley was doing well. The patient is now pending procedure as outlined above. Since her last visit, she has done well from a cardiac standpoint.   She denies chest pain, palpitations, dyspnea, pnd, orthopnea, n, v, dizziness, syncope, edema, weight gain, or early satiety. All other systems reviewed and are otherwise negative  except as noted above.   Past Medical History    Past Medical History:  Diagnosis Date   Arthritis    Asthma    Breast cancer of upper-outer quadrant of right female breast (HCC) 12/23/2014   Bruises easily    CAD S/P percutaneous coronary angioplasty 01/17/2014   takes Brilinta and Plavix daily   Cataracts, bilateral    Complication of anesthesia    COPD (chronic obstructive pulmonary disease) (HCC)    Diabetes mellitus without complication (HCC)    pre DM per pt. now after weight loss surgery   no meds   Dyspnea    Exertional dyspnea 12/07/2023   History of bronchitis 7-25yrs ago   History of tobacco abuse    40 pack-years   Hyperlipidemia    takes Lipitor daily   Hypertension    takes Metoprolol daily   Joint pain    Obesity    OSA treated with BiPAP    moderate with AHI 19/hr now on BIPAP auto   PONV (postoperative nausea and vomiting)    [place]   ST elevation myocardial infarction (STEMI) of inferolateral wall (HCC) 01/17/2014   Past Surgical History:  Procedure Laterality Date   ABDOMINAL HYSTERECTOMY     APPENDECTOMY     BREAST REDUCTION SURGERY Left 01/27/2015   BREAST REDUCTION SURGERY Left 01/27/2015   Procedure: MAMMARY REDUCTION  LEFT (BREAST);  Surgeon: Earlis Ranks, MD;  Location: [facility] OR;  Service: Plastics;  Laterality: Left;   CATARACT EXTRACTION W/PHACO Right 04/11/2016   Procedure: CATARACT EXTRACTION PHACO AND INTRAOCULAR LENS PLACEMENT (IOC);  Surgeon: Oneil Platts, MD;  Location: [facility] ORS;  Service: Ophthalmology;  Laterality: Right;  CDE: 14.15   CATARACT EXTRACTION W/PHACO Left 05/02/2016   Procedure: CATARACT EXTRACTION PHACO AND INTRAOCULAR LENS PLACEMENT (IOC);  Surgeon: Oneil Platts, MD;  Location: [facility];  Service: Ophthalmology;  Laterality: Left;  CDE: 12.11   CHOLECYSTECTOMY     COLONOSCOPY     CYSTOSCOPY/URETEROSCOPY/HOLMIUM LASER/STENT PLACEMENT Right 07/24/2023   Procedure: RIGHT URETEROSCOPY/HOLMIUM LASER/STENT PLACEMENT;  Surgeon: Lovie Arlyss CROME, MD;  Location: [facility] ORS;  Service: Urology;  Laterality: Right;   EYE SURGERY     LEFT HEART CATH Bilateral 01/17/2014   Procedure: LEFT HEART CATH;  Surgeon: Dorn JINNY Lesches, MD;  Location: [facility] CATH LAB;  Service: Cardiovascular;  Laterality: Bilateral;   MASTECTOMY W/ SENTINEL NODE BIOPSY Right 01/27/2015   dr ethyl    MASTECTOMY W/ SENTINEL NODE BIOPSY Right 01/27/2015   Procedure: RIGHT MASTECTOMY WITH RIGHT AXILLARY  SENTINEL LYMPH NODE BIOPSY;  Surgeon: Alm ethyl, MD;  Location: [facility] OR;  Service: General;  Laterality: Right;   PERCUTANEOUS CORONARY STENT INTERVENTION (PCI-S)     PORTACATH PLACEMENT Left 03/23/2015   Procedure: INSERTION PORT-A-CATH ;  Surgeon: Alm ethyl, MD;  Location: [facility] ORS;  Service: General;  Laterality: Left;   sleeve gastrectomy     SPINAL FUSION     TOTAL KNEE ARTHROPLASTY Left 10/13/2019   Procedure: TOTAL KNEE ARTHROPLASTY;  Surgeon: Rubie Kemps, MD;  Location: [facility] OR[facility];  Service: Orthopedics;  Laterality: Left;  75 mins needed for length of case.   TOTAL SHOULDER ARTHROPLASTY Left 04/19/2017   Procedure: TOTAL SHOULDER ARTHROPLASTY;  Surgeon: Dozier Soulier, MD;  Location: [facility] OR;  Service: Orthopedics;  Laterality: Left;  Left total shoulder arthroplasty   TRIGGER FINGER RELEASE Right 10/13/2021   Procedure: RELEASE TRIGGER FINGER/A-1 PULLEY RIGHT MIDDLE FINGER;  Surgeon: Murrell Kuba, MD;  Location: [facility];  Service: Orthopedics;  Laterality: Right;   TUBAL LIGATION      Allergies  Allergies  Allergen Reactions   Prednisone Other (See Comments)    made poison ivy worse, rebound effect when take prednisone   Codeine Nausea Only and Other (See Comments)    HEADACHE    Home Medications    Prior to Admission medications   Medication Sig Start Date End Date Taking? Authorizing Provider  amLODipine (NORVASC) 10 MG tablet Take 1 tablet (10 mg total) by mouth daily. 12/07/23 09/07/24  Raford Riggs, MD  aspirin 81 MG  chewable tablet Chew 81 mg by mouth daily.    [provider]  atorvastatin (LIPITOR) 80 MG tablet Take 1 tablet (80 mg total) by mouth daily at 6 PM. 02/26/24   Lesches Dorn JINNY, MD  Calcium Carbonate-Vitamin D (CALCIUM-D PO) Take 2 tablets by mouth daily.    [provider]  cholecalciferol (VITAMIN D3) 25 MCG (1000 UNIT) tablet Take 1,000 Units by mouth daily.    [provider]  empagliflozin (JARDIANCE) 25 MG TABS tablet Take 1 tablet (25 mg total) by mouth daily. 05/07/24     gabapentin (NEURONTIN) 100 MG capsule Take 1 capsule (100 mg total) by mouth 3 (three) times daily. 05/07/24     inclisiran (LEQVIO) 284 MG/1.5ML SOSY injection Inject 284 mg into the skin every 6 (six) months.    [provider]  metoprolol succinate (TOPROL-XL) 50 MG 24 hr tablet Take 1 tablet (50 mg total) by mouth daily with or immediately following a meal. 07/26/23   Walker, Caitlin S, NP  nitroGLYCERIN (NITROSTAT) 0.4 MG SL tablet PLACE 1 TABLET UNDER  THE TONGUE EVERY 5 MINUTES AS NEEDED FOR CHEST PAIN. 02/25/24   Court Dorn PARAS, MD  valsartan (DIOVAN) 160 MG tablet Take 1 tablet (160 mg total) by mouth daily. 05/14/24   Raford Riggs, MD    Physical Exam    Vital Signs:   BP 119/68  Given telephonic nature of communication, physical exam is limited. AAOx3. NAD. Normal affect.  Speech and respirations are unlabored.  Accessory Clinical Findings    None  Assessment & Plan    1.  Preoperative Cardiovascular Risk Assessment:  According to the Revised Cardiac Risk Index (RCRI), her Perioperative Risk of Major Cardiac Event is (%): 0.9. Her Functional Capacity in METs is: 7.01 according to the Duke Activity Status Index (DASI). Therefore, based on ACC/AHA guidelines, patient would be at acceptable risk for the planned procedure without further cardiovascular testing.  The patient was advised that if she develops new symptoms prior to surgery to contact our office to arrange  for a follow-up visit, and she verbalized understanding.  Regarding ASA therapy, we recommend continuation of ASA throughout the perioperative period.  However, if the surgeon feels that cessation of ASA is required in the perioperative period, it may be stopped 5-7 days prior to surgery with a plan to resume it as soon as felt to be feasible from a surgical standpoint in the post-operative period.   A copy of this note will be routed to requesting surgeon.  Time:   Today, I have spent 5 minutes with the patient with telehealth technology discussing medical history, symptoms, and management plan.     Damien JAYSON Braver, NP  06/19/2024, 2:16 PM

## 2024-06-20 ENCOUNTER — Other Ambulatory Visit (HOSPITAL_BASED_OUTPATIENT_CLINIC_OR_DEPARTMENT_OTHER): Payer: Self-pay

## 2024-07-08 DIAGNOSIS — Z01818 Encounter for other preprocedural examination: Secondary | ICD-10-CM | POA: Diagnosis not present

## 2024-07-08 DIAGNOSIS — M180 Bilateral primary osteoarthritis of first carpometacarpal joints: Secondary | ICD-10-CM | POA: Diagnosis not present

## 2024-07-08 DIAGNOSIS — E1322 Other specified diabetes mellitus with diabetic chronic kidney disease: Secondary | ICD-10-CM | POA: Diagnosis not present

## 2024-07-17 ENCOUNTER — Other Ambulatory Visit (HOSPITAL_BASED_OUTPATIENT_CLINIC_OR_DEPARTMENT_OTHER): Payer: Self-pay | Admitting: Family

## 2024-07-18 ENCOUNTER — Other Ambulatory Visit (HOSPITAL_BASED_OUTPATIENT_CLINIC_OR_DEPARTMENT_OTHER): Payer: Self-pay

## 2024-07-18 MED ORDER — METOPROLOL SUCCINATE ER 50 MG PO TB24
50.0000 mg | ORAL_TABLET | Freq: Every day | ORAL | 3 refills | Status: AC
  Filled 2024-07-18: qty 90, 90d supply, fill #0
  Filled 2024-10-17: qty 90, 90d supply, fill #1

## 2024-07-30 ENCOUNTER — Other Ambulatory Visit (HOSPITAL_BASED_OUTPATIENT_CLINIC_OR_DEPARTMENT_OTHER): Payer: Self-pay

## 2024-07-30 DIAGNOSIS — G8918 Other acute postprocedural pain: Secondary | ICD-10-CM | POA: Diagnosis not present

## 2024-07-30 DIAGNOSIS — M180 Bilateral primary osteoarthritis of first carpometacarpal joints: Secondary | ICD-10-CM | POA: Diagnosis not present

## 2024-07-30 MED ORDER — TRAMADOL HCL 50 MG PO TABS
50.0000 mg | ORAL_TABLET | Freq: Four times a day (QID) | ORAL | 0 refills | Status: AC | PRN
  Filled 2024-07-30: qty 15, 4d supply, fill #0

## 2024-08-01 ENCOUNTER — Other Ambulatory Visit: Payer: Self-pay

## 2024-08-08 ENCOUNTER — Other Ambulatory Visit: Payer: Self-pay

## 2024-08-10 ENCOUNTER — Other Ambulatory Visit (HOSPITAL_BASED_OUTPATIENT_CLINIC_OR_DEPARTMENT_OTHER): Payer: Self-pay

## 2024-08-11 ENCOUNTER — Ambulatory Visit (INDEPENDENT_AMBULATORY_CARE_PROVIDER_SITE_OTHER): Admitting: *Deleted

## 2024-08-11 ENCOUNTER — Other Ambulatory Visit: Payer: Self-pay

## 2024-08-11 ENCOUNTER — Other Ambulatory Visit (HOSPITAL_BASED_OUTPATIENT_CLINIC_OR_DEPARTMENT_OTHER): Payer: Self-pay

## 2024-08-11 VITALS — BP 147/77 | HR 73 | Temp 97.4°F | Resp 20 | Ht 67.0 in | Wt 254.4 lb

## 2024-08-11 DIAGNOSIS — E785 Hyperlipidemia, unspecified: Secondary | ICD-10-CM

## 2024-08-11 DIAGNOSIS — I252 Old myocardial infarction: Secondary | ICD-10-CM

## 2024-08-11 DIAGNOSIS — I2510 Atherosclerotic heart disease of native coronary artery without angina pectoris: Secondary | ICD-10-CM

## 2024-08-11 MED ORDER — INCLISIRAN SODIUM 284 MG/1.5ML ~~LOC~~ SOSY
284.0000 mg | PREFILLED_SYRINGE | Freq: Once | SUBCUTANEOUS | Status: AC
  Administered 2024-08-11: 284 mg via SUBCUTANEOUS
  Filled 2024-08-11: qty 1.5

## 2024-08-11 MED ORDER — JARDIANCE 25 MG PO TABS
25.0000 mg | ORAL_TABLET | Freq: Every day | ORAL | 0 refills | Status: DC
  Filled 2024-08-11: qty 90, 90d supply, fill #0

## 2024-08-11 NOTE — Progress Notes (Signed)
 Diagnosis: Hyperlipidemia  Provider:  Mannam, Praveen MD  Procedure: Injection  Leqvio  (inclisiran), Dose: 284 mg, Site: subcutaneous, Number of injections: 1  Injection Site(s): Left arm  Post Care: Patient declined observation  Discharge: Condition: Good, Destination: Home . AVS Declined  Performed by:  Mathew Therisa NOVAK, RN

## 2024-08-12 DIAGNOSIS — E559 Vitamin D deficiency, unspecified: Secondary | ICD-10-CM | POA: Diagnosis not present

## 2024-08-12 DIAGNOSIS — E1121 Type 2 diabetes mellitus with diabetic nephropathy: Secondary | ICD-10-CM | POA: Diagnosis not present

## 2024-08-12 DIAGNOSIS — E7800 Pure hypercholesterolemia, unspecified: Secondary | ICD-10-CM | POA: Diagnosis not present

## 2024-08-12 DIAGNOSIS — I10 Essential (primary) hypertension: Secondary | ICD-10-CM | POA: Diagnosis not present

## 2024-08-14 ENCOUNTER — Other Ambulatory Visit (HOSPITAL_BASED_OUTPATIENT_CLINIC_OR_DEPARTMENT_OTHER): Payer: Self-pay

## 2024-08-14 ENCOUNTER — Other Ambulatory Visit (HOSPITAL_COMMUNITY): Payer: Self-pay | Admitting: Family Medicine

## 2024-08-14 DIAGNOSIS — M25641 Stiffness of right hand, not elsewhere classified: Secondary | ICD-10-CM | POA: Diagnosis not present

## 2024-08-14 DIAGNOSIS — E559 Vitamin D deficiency, unspecified: Secondary | ICD-10-CM | POA: Diagnosis not present

## 2024-08-14 DIAGNOSIS — Z122 Encounter for screening for malignant neoplasm of respiratory organs: Secondary | ICD-10-CM | POA: Diagnosis not present

## 2024-08-14 DIAGNOSIS — Z79899 Other long term (current) drug therapy: Secondary | ICD-10-CM | POA: Diagnosis not present

## 2024-08-14 DIAGNOSIS — M180 Bilateral primary osteoarthritis of first carpometacarpal joints: Secondary | ICD-10-CM | POA: Diagnosis not present

## 2024-08-14 DIAGNOSIS — G4733 Obstructive sleep apnea (adult) (pediatric): Secondary | ICD-10-CM | POA: Diagnosis not present

## 2024-08-14 DIAGNOSIS — Z0000 Encounter for general adult medical examination without abnormal findings: Secondary | ICD-10-CM | POA: Diagnosis not present

## 2024-08-14 DIAGNOSIS — M79641 Pain in right hand: Secondary | ICD-10-CM | POA: Diagnosis not present

## 2024-08-14 DIAGNOSIS — Z87891 Personal history of nicotine dependence: Secondary | ICD-10-CM

## 2024-08-14 DIAGNOSIS — E785 Hyperlipidemia, unspecified: Secondary | ICD-10-CM | POA: Diagnosis not present

## 2024-08-14 DIAGNOSIS — E1121 Type 2 diabetes mellitus with diabetic nephropathy: Secondary | ICD-10-CM | POA: Diagnosis not present

## 2024-08-14 DIAGNOSIS — L309 Dermatitis, unspecified: Secondary | ICD-10-CM | POA: Diagnosis not present

## 2024-08-14 DIAGNOSIS — Z23 Encounter for immunization: Secondary | ICD-10-CM | POA: Diagnosis not present

## 2024-08-14 DIAGNOSIS — E1140 Type 2 diabetes mellitus with diabetic neuropathy, unspecified: Secondary | ICD-10-CM | POA: Diagnosis not present

## 2024-08-14 DIAGNOSIS — E2839 Other primary ovarian failure: Secondary | ICD-10-CM | POA: Diagnosis not present

## 2024-08-14 MED ORDER — RYBELSUS 3 MG PO TABS
3.0000 mg | ORAL_TABLET | Freq: Every day | ORAL | 0 refills | Status: DC
  Filled 2024-08-14: qty 30, 30d supply, fill #0

## 2024-08-16 ENCOUNTER — Other Ambulatory Visit (HOSPITAL_BASED_OUTPATIENT_CLINIC_OR_DEPARTMENT_OTHER): Payer: Self-pay | Admitting: Family Medicine

## 2024-08-16 DIAGNOSIS — E2839 Other primary ovarian failure: Secondary | ICD-10-CM

## 2024-08-21 ENCOUNTER — Ambulatory Visit (HOSPITAL_COMMUNITY)
Admission: RE | Admit: 2024-08-21 | Discharge: 2024-08-21 | Disposition: A | Discharge status: Home / Self Care | Source: Ambulatory Visit | Attending: Family Medicine | Admitting: Family Medicine

## 2024-08-21 ENCOUNTER — Other Ambulatory Visit (HOSPITAL_BASED_OUTPATIENT_CLINIC_OR_DEPARTMENT_OTHER): Payer: Self-pay

## 2024-08-21 DIAGNOSIS — Z122 Encounter for screening for malignant neoplasm of respiratory organs: Secondary | ICD-10-CM | POA: Diagnosis not present

## 2024-08-21 DIAGNOSIS — Z87891 Personal history of nicotine dependence: Secondary | ICD-10-CM | POA: Diagnosis not present

## 2024-08-21 MED ORDER — JANUVIA 25 MG PO TABS
25.0000 mg | ORAL_TABLET | Freq: Every day | ORAL | 0 refills | Status: DC
  Filled 2024-08-21: qty 30, 30d supply, fill #0

## 2024-08-28 DIAGNOSIS — M79641 Pain in right hand: Secondary | ICD-10-CM | POA: Diagnosis not present

## 2024-08-28 DIAGNOSIS — M25641 Stiffness of right hand, not elsewhere classified: Secondary | ICD-10-CM | POA: Diagnosis not present

## 2024-09-01 ENCOUNTER — Other Ambulatory Visit (HOSPITAL_BASED_OUTPATIENT_CLINIC_OR_DEPARTMENT_OTHER): Payer: Self-pay

## 2024-09-01 ENCOUNTER — Encounter: Payer: Self-pay | Admitting: Cardiovascular Disease

## 2024-09-01 DIAGNOSIS — Z23 Encounter for immunization: Secondary | ICD-10-CM | POA: Diagnosis not present

## 2024-09-01 MED ORDER — CAPVAXIVE 0.5 ML IM SOSY
0.5000 mL | PREFILLED_SYRINGE | Freq: Once | INTRAMUSCULAR | 0 refills | Status: AC
  Filled 2024-09-01: qty 0.5, 1d supply, fill #0

## 2024-09-04 DIAGNOSIS — M25641 Stiffness of right hand, not elsewhere classified: Secondary | ICD-10-CM | POA: Diagnosis not present

## 2024-09-04 DIAGNOSIS — M79641 Pain in right hand: Secondary | ICD-10-CM | POA: Diagnosis not present

## 2024-09-11 DIAGNOSIS — M79641 Pain in right hand: Secondary | ICD-10-CM | POA: Diagnosis not present

## 2024-09-11 DIAGNOSIS — M25641 Stiffness of right hand, not elsewhere classified: Secondary | ICD-10-CM | POA: Diagnosis not present

## 2024-09-13 ENCOUNTER — Other Ambulatory Visit (HOSPITAL_BASED_OUTPATIENT_CLINIC_OR_DEPARTMENT_OTHER): Payer: Self-pay

## 2024-09-14 ENCOUNTER — Encounter: Payer: Self-pay | Admitting: Cardiovascular Disease

## 2024-09-14 ENCOUNTER — Other Ambulatory Visit (HOSPITAL_BASED_OUTPATIENT_CLINIC_OR_DEPARTMENT_OTHER): Payer: Self-pay

## 2024-09-14 MED ORDER — JANUVIA 25 MG PO TABS
25.0000 mg | ORAL_TABLET | Freq: Every day | ORAL | 0 refills | Status: AC
  Filled 2024-09-14: qty 90, 90d supply, fill #0

## 2024-09-23 DIAGNOSIS — G4733 Obstructive sleep apnea (adult) (pediatric): Secondary | ICD-10-CM | POA: Diagnosis not present

## 2024-09-25 DIAGNOSIS — M79641 Pain in right hand: Secondary | ICD-10-CM | POA: Diagnosis not present

## 2024-09-25 DIAGNOSIS — M25641 Stiffness of right hand, not elsewhere classified: Secondary | ICD-10-CM | POA: Diagnosis not present

## 2024-09-25 DIAGNOSIS — M180 Bilateral primary osteoarthritis of first carpometacarpal joints: Secondary | ICD-10-CM | POA: Diagnosis not present

## 2024-09-30 DIAGNOSIS — M25641 Stiffness of right hand, not elsewhere classified: Secondary | ICD-10-CM | POA: Diagnosis not present

## 2024-09-30 DIAGNOSIS — M79641 Pain in right hand: Secondary | ICD-10-CM | POA: Diagnosis not present

## 2024-10-09 DIAGNOSIS — M25641 Stiffness of right hand, not elsewhere classified: Secondary | ICD-10-CM | POA: Diagnosis not present

## 2024-10-09 DIAGNOSIS — M79641 Pain in right hand: Secondary | ICD-10-CM | POA: Diagnosis not present

## 2024-10-16 DIAGNOSIS — M79641 Pain in right hand: Secondary | ICD-10-CM | POA: Diagnosis not present

## 2024-10-16 DIAGNOSIS — M25641 Stiffness of right hand, not elsewhere classified: Secondary | ICD-10-CM | POA: Diagnosis not present

## 2024-11-10 ENCOUNTER — Other Ambulatory Visit (HOSPITAL_BASED_OUTPATIENT_CLINIC_OR_DEPARTMENT_OTHER): Payer: Self-pay

## 2024-11-10 ENCOUNTER — Other Ambulatory Visit: Payer: Self-pay

## 2024-11-11 ENCOUNTER — Other Ambulatory Visit (HOSPITAL_BASED_OUTPATIENT_CLINIC_OR_DEPARTMENT_OTHER): Payer: Self-pay

## 2024-11-11 MED ORDER — JARDIANCE 25 MG PO TABS
25.0000 mg | ORAL_TABLET | Freq: Every day | ORAL | 0 refills | Status: AC
  Filled 2024-11-11: qty 90, 90d supply, fill #0

## 2024-11-12 ENCOUNTER — Other Ambulatory Visit (HOSPITAL_BASED_OUTPATIENT_CLINIC_OR_DEPARTMENT_OTHER): Payer: Self-pay

## 2024-11-12 ENCOUNTER — Encounter: Payer: Self-pay | Admitting: Cardiovascular Disease

## 2024-11-12 ENCOUNTER — Other Ambulatory Visit: Payer: Self-pay

## 2024-11-12 MED ORDER — TRAMADOL HCL 50 MG PO TABS
50.0000 mg | ORAL_TABLET | Freq: Four times a day (QID) | ORAL | 0 refills | Status: AC | PRN
  Filled 2024-11-12: qty 15, 4d supply, fill #0

## 2024-11-12 MED ORDER — GABAPENTIN 100 MG PO CAPS
100.0000 mg | ORAL_CAPSULE | Freq: Three times a day (TID) | ORAL | 1 refills | Status: AC
  Filled 2024-11-12: qty 270, 90d supply, fill #0

## 2024-11-13 ENCOUNTER — Other Ambulatory Visit: Payer: Self-pay

## 2024-11-13 ENCOUNTER — Other Ambulatory Visit (HOSPITAL_BASED_OUTPATIENT_CLINIC_OR_DEPARTMENT_OTHER): Payer: Self-pay

## 2024-11-13 MED ORDER — JANUVIA 50 MG PO TABS
50.0000 mg | ORAL_TABLET | Freq: Every day | ORAL | 0 refills | Status: AC
  Filled 2024-11-13: qty 90, 90d supply, fill #0

## 2024-11-13 MED ORDER — JARDIANCE 25 MG PO TABS
25.0000 mg | ORAL_TABLET | Freq: Every day | ORAL | 3 refills | Status: AC
  Filled 2024-11-13: qty 90, 90d supply, fill #0

## 2024-11-14 ENCOUNTER — Telehealth: Payer: Self-pay

## 2024-11-14 NOTE — Telephone Encounter (Signed)
 Auth Submission: NO AUTH NEEDED Site of care: Site of care: [facility] Payer: Medicare A/B with [employer] Medication & CPT/J Code(s) submitted: Leqvio  (Inclisiran) J1306 Diagnosis Code:  Route of submission (phone, fax, portal):  Phone # Fax # Auth type: Buy/Bill PB Units/visits requested: 284mg  x 2 doses Reference number:  Approval from: 11/14/24 to 12/06/25

## 2024-12-15 ENCOUNTER — Ambulatory Visit: Admitting: Physician Assistant

## 2025-01-01 ENCOUNTER — Encounter (HOSPITAL_BASED_OUTPATIENT_CLINIC_OR_DEPARTMENT_OTHER)

## 2025-01-05 ENCOUNTER — Ambulatory Visit: Admitting: Cardiovascular Disease

## 2025-02-10 ENCOUNTER — Ambulatory Visit
